# Patient Record
Sex: Male | Born: 1935 | Race: White | Hispanic: No | Smoking: Former smoker
Health system: Southern US, Community
[De-identification: ages and names within clinical notes are randomized; demographics above are authoritative.]

## PROBLEM LIST (undated history)

## (undated) DIAGNOSIS — R112 Nausea with vomiting, unspecified: Secondary | ICD-10-CM

## (undated) DIAGNOSIS — I472 Ventricular tachycardia, unspecified: Secondary | ICD-10-CM

## (undated) DIAGNOSIS — R9430 Abnormal result of cardiovascular function study, unspecified: Secondary | ICD-10-CM

## (undated) DIAGNOSIS — I493 Ventricular premature depolarization: Secondary | ICD-10-CM

## (undated) DIAGNOSIS — G5603 Carpal tunnel syndrome, bilateral upper limbs: Secondary | ICD-10-CM

## (undated) DIAGNOSIS — Z87442 Personal history of urinary calculi: Secondary | ICD-10-CM

## (undated) DIAGNOSIS — I350 Nonrheumatic aortic (valve) stenosis: Secondary | ICD-10-CM

## (undated) DIAGNOSIS — N183 Chronic kidney disease, stage 3 unspecified: Secondary | ICD-10-CM

## (undated) DIAGNOSIS — I4729 Other ventricular tachycardia: Secondary | ICD-10-CM

## (undated) DIAGNOSIS — Z9989 Dependence on other enabling machines and devices: Secondary | ICD-10-CM

## (undated) DIAGNOSIS — Z9889 Other specified postprocedural states: Secondary | ICD-10-CM

## (undated) DIAGNOSIS — M1990 Unspecified osteoarthritis, unspecified site: Secondary | ICD-10-CM

## (undated) DIAGNOSIS — Z8719 Personal history of other diseases of the digestive system: Secondary | ICD-10-CM

## (undated) DIAGNOSIS — E785 Hyperlipidemia, unspecified: Secondary | ICD-10-CM

## (undated) DIAGNOSIS — I340 Nonrheumatic mitral (valve) insufficiency: Secondary | ICD-10-CM

## (undated) DIAGNOSIS — N182 Chronic kidney disease, stage 2 (mild): Secondary | ICD-10-CM

## (undated) DIAGNOSIS — I480 Paroxysmal atrial fibrillation: Secondary | ICD-10-CM

## (undated) DIAGNOSIS — I5022 Chronic systolic (congestive) heart failure: Secondary | ICD-10-CM

## (undated) DIAGNOSIS — R040 Epistaxis: Secondary | ICD-10-CM

## (undated) DIAGNOSIS — I10 Essential (primary) hypertension: Secondary | ICD-10-CM

## (undated) DIAGNOSIS — Z950 Presence of cardiac pacemaker: Secondary | ICD-10-CM

## (undated) DIAGNOSIS — I429 Cardiomyopathy, unspecified: Secondary | ICD-10-CM

## (undated) DIAGNOSIS — I2510 Atherosclerotic heart disease of native coronary artery without angina pectoris: Secondary | ICD-10-CM

## (undated) DIAGNOSIS — D649 Anemia, unspecified: Secondary | ICD-10-CM

## (undated) DIAGNOSIS — I5032 Chronic diastolic (congestive) heart failure: Secondary | ICD-10-CM

## (undated) DIAGNOSIS — G4733 Obstructive sleep apnea (adult) (pediatric): Secondary | ICD-10-CM

## (undated) HISTORY — DX: Paroxysmal atrial fibrillation: I48.0

## (undated) HISTORY — DX: Nonrheumatic aortic (valve) stenosis: I35.0

## (undated) HISTORY — DX: Atherosclerotic heart disease of native coronary artery without angina pectoris: I25.10

## (undated) HISTORY — DX: Chronic systolic (congestive) heart failure: I50.22

## (undated) HISTORY — PX: COLONOSCOPY: SHX174

## (undated) HISTORY — DX: Obstructive sleep apnea (adult) (pediatric): G47.33

## (undated) HISTORY — PX: KNEE CARTILAGE SURGERY: SHX688

## (undated) HISTORY — DX: Essential (primary) hypertension: I10

## (undated) HISTORY — DX: Hyperlipidemia, unspecified: E78.5

## (undated) HISTORY — DX: Cardiomyopathy, unspecified: I42.9

## (undated) HISTORY — PX: INSERT / REPLACE / REMOVE PACEMAKER: SUR710

## (undated) HISTORY — DX: Obstructive sleep apnea (adult) (pediatric): Z99.89

## (undated) HISTORY — DX: Anemia, unspecified: D64.9

---

## 1969-07-01 HISTORY — PX: SHOULDER SURGERY: SHX246

## 1970-10-31 HISTORY — PX: KNEE CARTILAGE SURGERY: SHX688

## 1985-10-31 HISTORY — PX: SHOULDER HEMI-ARTHROPLASTY: SHX5049

## 1998-08-19 ENCOUNTER — Emergency Department (HOSPITAL_COMMUNITY): Admission: EM | Admit: 1998-08-19 | Discharge: 1998-08-19 | Payer: Self-pay | Admitting: Emergency Medicine

## 1998-08-19 ENCOUNTER — Encounter: Payer: Self-pay | Admitting: Emergency Medicine

## 1999-07-02 HISTORY — PX: CATARACT EXTRACTION W/ INTRAOCULAR LENS  IMPLANT, BILATERAL: SHX1307

## 2000-06-09 ENCOUNTER — Encounter: Payer: Self-pay | Admitting: Orthopedic Surgery

## 2000-06-09 ENCOUNTER — Ambulatory Visit (HOSPITAL_COMMUNITY): Admission: RE | Admit: 2000-06-09 | Discharge: 2000-06-09 | Payer: Self-pay | Admitting: Orthopedic Surgery

## 2001-08-24 ENCOUNTER — Encounter: Payer: Self-pay | Admitting: Otolaryngology

## 2001-08-24 ENCOUNTER — Encounter: Admission: RE | Admit: 2001-08-24 | Discharge: 2001-08-24 | Payer: Self-pay | Admitting: Otolaryngology

## 2001-08-28 ENCOUNTER — Ambulatory Visit (HOSPITAL_BASED_OUTPATIENT_CLINIC_OR_DEPARTMENT_OTHER): Admission: RE | Admit: 2001-08-28 | Discharge: 2001-08-28 | Payer: Self-pay | Admitting: Otolaryngology

## 2002-09-20 ENCOUNTER — Encounter: Payer: Self-pay | Admitting: *Deleted

## 2002-09-20 ENCOUNTER — Ambulatory Visit (HOSPITAL_COMMUNITY): Admission: RE | Admit: 2002-09-20 | Discharge: 2002-09-20 | Payer: Self-pay | Admitting: *Deleted

## 2002-10-10 ENCOUNTER — Encounter (INDEPENDENT_AMBULATORY_CARE_PROVIDER_SITE_OTHER): Payer: Self-pay | Admitting: Specialist

## 2002-10-10 ENCOUNTER — Ambulatory Visit (HOSPITAL_COMMUNITY): Admission: RE | Admit: 2002-10-10 | Discharge: 2002-10-10 | Payer: Self-pay | Admitting: *Deleted

## 2003-11-14 ENCOUNTER — Encounter: Admission: RE | Admit: 2003-11-14 | Discharge: 2003-11-14 | Payer: Self-pay | Admitting: Orthopedic Surgery

## 2004-11-04 ENCOUNTER — Ambulatory Visit (HOSPITAL_COMMUNITY): Admission: RE | Admit: 2004-11-04 | Discharge: 2004-11-04 | Payer: Self-pay | Admitting: *Deleted

## 2005-01-24 ENCOUNTER — Inpatient Hospital Stay (HOSPITAL_COMMUNITY): Admission: RE | Admit: 2005-01-24 | Discharge: 2005-01-28 | Payer: Self-pay | Admitting: Orthopedic Surgery

## 2005-05-11 ENCOUNTER — Ambulatory Visit (HOSPITAL_COMMUNITY): Admission: RE | Admit: 2005-05-11 | Discharge: 2005-05-11 | Payer: Self-pay | Admitting: Radiation Oncology

## 2005-10-31 HISTORY — PX: TOTAL KNEE ARTHROPLASTY: SHX125

## 2008-01-25 ENCOUNTER — Ambulatory Visit (HOSPITAL_COMMUNITY): Admission: RE | Admit: 2008-01-25 | Discharge: 2008-01-25 | Payer: Self-pay | Admitting: Cardiology

## 2008-12-17 ENCOUNTER — Encounter: Admission: RE | Admit: 2008-12-17 | Discharge: 2008-12-17 | Payer: Self-pay | Admitting: Family Medicine

## 2009-05-08 ENCOUNTER — Ambulatory Visit: Payer: Self-pay | Admitting: Surgery

## 2009-05-08 ENCOUNTER — Inpatient Hospital Stay (HOSPITAL_COMMUNITY): Admission: EM | Admit: 2009-05-08 | Discharge: 2009-05-09 | Payer: Self-pay | Admitting: Emergency Medicine

## 2009-05-08 ENCOUNTER — Encounter (INDEPENDENT_AMBULATORY_CARE_PROVIDER_SITE_OTHER): Payer: Self-pay | Admitting: Internal Medicine

## 2010-07-02 ENCOUNTER — Encounter: Payer: Self-pay | Admitting: Internal Medicine

## 2010-07-13 ENCOUNTER — Encounter: Payer: Self-pay | Admitting: Internal Medicine

## 2010-07-23 ENCOUNTER — Encounter: Payer: Self-pay | Admitting: Internal Medicine

## 2010-07-30 ENCOUNTER — Encounter: Payer: Self-pay | Admitting: Internal Medicine

## 2010-08-02 ENCOUNTER — Encounter (INDEPENDENT_AMBULATORY_CARE_PROVIDER_SITE_OTHER): Payer: Self-pay | Admitting: *Deleted

## 2010-09-10 ENCOUNTER — Encounter: Payer: Self-pay | Admitting: Internal Medicine

## 2010-09-27 ENCOUNTER — Ambulatory Visit: Payer: Self-pay | Admitting: Internal Medicine

## 2010-09-27 DIAGNOSIS — R55 Syncope and collapse: Secondary | ICD-10-CM | POA: Insufficient documentation

## 2010-09-27 DIAGNOSIS — I10 Essential (primary) hypertension: Secondary | ICD-10-CM | POA: Insufficient documentation

## 2010-09-27 DIAGNOSIS — I441 Atrioventricular block, second degree: Secondary | ICD-10-CM | POA: Insufficient documentation

## 2010-09-28 ENCOUNTER — Ambulatory Visit: Payer: Self-pay | Admitting: Internal Medicine

## 2010-09-28 LAB — CONVERTED CEMR LAB
Basophils Absolute: 0 10*3/uL (ref 0.0–0.1)
Basophils Relative: 0.1 % (ref 0.0–3.0)
Calcium: 8.8 mg/dL (ref 8.4–10.5)
Eosinophils Relative: 1.7 % (ref 0.0–5.0)
GFR calc non Af Amer: 62.57 mL/min (ref >60)
HCT: 43.2 % (ref 39.0–52.0)
Hemoglobin: 14.6 g/dL (ref 13.0–17.0)
INR: 1 (ref 0.8–1.0)
Lymphocytes Relative: 19.4 % (ref 12.0–46.0)
Lymphs Abs: 1.2 10*3/uL (ref 0.7–4.0)
Monocytes Relative: 5.2 % (ref 3.0–12.0)
Neutro Abs: 4.5 10*3/uL (ref 1.4–7.7)
Potassium: 3.6 meq/L (ref 3.5–5.1)
RBC: 4.65 M/uL (ref 4.22–5.81)
Sodium: 138 meq/L (ref 135–145)
WBC: 6.1 10*3/uL (ref 4.5–10.5)

## 2010-09-29 ENCOUNTER — Encounter: Payer: Self-pay | Admitting: Internal Medicine

## 2010-10-05 ENCOUNTER — Inpatient Hospital Stay (HOSPITAL_COMMUNITY)
Admission: RE | Admit: 2010-10-05 | Discharge: 2010-10-06 | Payer: Self-pay | Source: Home / Self Care | Attending: Internal Medicine | Admitting: Internal Medicine

## 2010-10-05 HISTORY — PX: PACEMAKER INSERTION: SHX728

## 2010-10-07 ENCOUNTER — Encounter: Payer: Self-pay | Admitting: Internal Medicine

## 2010-10-14 ENCOUNTER — Ambulatory Visit: Payer: Self-pay

## 2010-10-21 ENCOUNTER — Encounter: Payer: Self-pay | Admitting: Internal Medicine

## 2010-11-21 ENCOUNTER — Encounter: Payer: Self-pay | Admitting: Family Medicine
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
## 2010-11-30 NOTE — Miscellaneous (Signed)
Summary: Device preload  Clinical Lists Changes  Observations: Added new observation of PPM INDICATN: Syncope Mobitz II (10/07/2010 19:39) Added new observation of MAGNET RTE: BOL 85 ERI 65 (10/07/2010 19:39) Added new observation of PPMLEADSTAT2: active (10/07/2010 19:39) Added new observation of PPMLEADSER2: [id number] (10/07/2010 19:39) Added new observation of PPMLEADMOD2: 5092  (10/07/2010 19:39) Added new observation of PPMLEADLOC2: RV  (10/07/2010 19:39) Added new observation of PPMLEADSTAT1: active  (10/07/2010 19:39) Added new observation of PPMLEADSER1: [id number]  (10/07/2010 19:39) Added new observation of PPMLEADMOD1: 5076  (10/07/2010 19:39) Added new observation of PPMLEADLOC1: RA  (10/07/2010 19:39) Added new observation of PPM IMP MD: Hillis Range, MD  (10/07/2010 19:39) Added new observation of PPMLEADDOI2: 10/05/2010  (10/07/2010 19:39) Added new observation of PPMLEADDOI1: 10/05/2010  (10/07/2010 19:39) Added new observation of PPM DOI: 10/05/2010  (10/07/2010 19:39) Added new observation of PPM SERL#: [id number]  (10/07/2010 19:39) Added new observation of PPM MODL#: [id number]  (10/07/2010 32:44) Added new observation of PACEMAKERMFG: Medtronic  (10/07/2010 19:39) Added new observation of PACEMAKER MD: Hillis Range, MD  (10/07/2010 19:39)      PPM Specifications Following MD:  Hillis Range, MD     PPM Vendor:  Medtronic     PPM Model Number:  ADDRL1     PPM Serial Number:  [id number] PPM DOI:  10/05/2010     PPM Implanting MD:  Hillis Range, MD  Lead 1    Location: RA     DOI: 10/05/2010     Model #: [id number]     Serial #: [id number]     Status: active Lead 2    Location: RV     DOI: 10/05/2010     Model #: [id number]     Serial #: [id number]     Status: active  Magnet Response Rate:  BOL 85 ERI 65  Indications:  Syncope Mobitz II

## 2010-11-30 NOTE — Letter (Signed)
Summary: Implantable Device Instructions  Architectural technologist, Main Office  [address]   [place], [place] [address]   Phone: [phone]  Fax: [phone]      Implantable Device Instructions  You are scheduled for:  _____ Permanent Transvenous Pacemaker   on 10/05/10 with Dr. [name].  1.  Please arrive at the Short Stay Center at [facility] at 8:30am on the day of your procedure.  2.  Do not eat or drink  after midnight the night before your procedure.  3.  Complete lab work on 09/28/10.  .  You do not have to be fasting.  4.  All your medications can be taken with a sip of water the morning of your procedure   5.  Plan for an overnight stay.  Bring your insurance cards and a list of your medications.  6.  Wash your chest and neck with antibacterial soap (any brand) the evening before and the morning of your procedure.  Rinse well.  7.  Education material received:     Pacemaker _____            *If you have ANY questions after you get home, please call the office [phone].  Anselm Pancoast  *Every attempt is made to prevent procedures from being rescheduled.  Due to the nauture of Electrophysiology, rescheduling can happen.  The physician is always aware and directs the staff when this occurs.

## 2010-12-02 NOTE — Cardiovascular Report (Signed)
Summary: Office Visit   Office Visit   Imported By: Roderic Ovens 11/09/2010 14:10:55  _____________________________________________________________________  External Attachment:    Type:   Image     Comment:   External Document

## 2010-12-02 NOTE — Procedures (Signed)
Summary: End Of Summary Report  End Of Summary Report   Imported By: Erle Crocker 10/15/2010 16:18:44  _____________________________________________________________________  External Attachment:    Type:   Image     Comment:   External Document

## 2010-12-02 NOTE — Letter (Signed)
Summary: [facility] Office Visit Note   [facility] Office Visit Note   Imported By: Roderic Ovens 10/13/2010 10:59:19  _____________________________________________________________________  External Attachment:    Type:   Image     Comment:   External Document

## 2010-12-02 NOTE — Procedures (Signed)
Summary: wound check.mdt.amber   Current Medications (verified): 1)  Hydrochlorothiazide 25 Mg Tabs (Hydrochlorothiazide) .... 1/2 By Mouth Daily 2)  Vitamin D 2000 Unit Tabs (Cholecalciferol) .... Once Daily 3)  Aspirin 81 Mg Tbec (Aspirin) .... Take One Tablet By Mouth Daily 4)  Androgel .... Uad 5)  Fish Oil   Oil (Fish Oil) .... Once Daily  Allergies (verified): 1)  ! * Dilaudid 2)  ! Pcn  PPM Specifications Following MD:  Hillis Range, MD     PPM Vendor:  Medtronic     PPM Model Number:  ADDRL1     PPM Serial Number:  [id number] PPM DOI:  10/05/2010     PPM Implanting MD:  Hillis Range, MD  Lead 1    Location: RA     DOI: 10/05/2010     Model #: [id number]     Serial #: [id number]     Status: active Lead 2    Location: RV     DOI: 10/05/2010     Model #: [id number]     Serial #: [id number]     Status: active  Magnet Response Rate:  BOL 85 ERI 65  Indications:  Syncope Mobitz II   PPM Follow Up Remote Check?  No Battery Voltage:  2.79 V     Battery Est. Longevity:  13 years     Pacer Dependent:  No       PPM Device Measurements Atrium  Amplitude: 2.0 mV, Impedance: 607 ohms, Threshold: 0.75 V at 0.4 msec Right Ventricle  Amplitude: 15.68 mV, Impedance: 779 ohms, Threshold: 0.75 V at 0.4 msec  Episodes MS Episodes:  0     Percent Mode Switch:  0     Coumadin:  No Ventricular High Rate:  0     Atrial Pacing:  9.1%     Ventricular Pacing:  2.8%  Parameters Next Cardiology Appt Due:  01/27/2011 Tech Comments:  Steri strips removed, no redness or edema.  No parameter changes.  Device function normal.  ROV 01/27/11 with Dr. [name]. Altha Harm, LPN  October 21, 2010 9:47 AM

## 2010-12-03 NOTE — Cardiovascular Report (Signed)
Summary: Pre-Cath Orders  Pre-Cath Orders   Imported By: Marylou Mccoy 10/08/2010 18:10:04  _____________________________________________________________________  External Attachment:    Type:   Image     Comment:   External Document

## 2011-01-10 ENCOUNTER — Encounter: Payer: Self-pay | Admitting: Internal Medicine

## 2011-01-10 LAB — SURGICAL PCR SCREEN
MRSA, PCR: NEGATIVE
Staphylococcus aureus: NEGATIVE

## 2011-01-24 ENCOUNTER — Ambulatory Visit (INDEPENDENT_AMBULATORY_CARE_PROVIDER_SITE_OTHER): Payer: Medicare Other | Admitting: Internal Medicine

## 2011-01-24 ENCOUNTER — Encounter: Payer: Self-pay | Admitting: Internal Medicine

## 2011-01-24 DIAGNOSIS — I441 Atrioventricular block, second degree: Secondary | ICD-10-CM | POA: Insufficient documentation

## 2011-01-24 DIAGNOSIS — R55 Syncope and collapse: Secondary | ICD-10-CM

## 2011-01-24 NOTE — Patient Instructions (Signed)
Your physician recommends that you schedule a follow-up appointment in: as needed  

## 2011-01-24 NOTE — Assessment & Plan Note (Signed)
Doing well s/p PPM implantation by me in December.  The pocket has healed nicely and the device is functioning normally (see paceart note). No changes today Pt to have his device followed by Dr Eldridge Dace going forward.  I will see him as needed.

## 2011-01-24 NOTE — Progress Notes (Signed)
The patient presents today for routine electrophysiology followup.   He recently had a PPM implanted by me 10/06/10 for mobitz II AV block and syncope.  He has done very well since that time.  Today, he denies symptoms of palpitations, chest pain, shortness of breath, orthopnea, PND, lower extremity edema, dizziness, presyncope, syncope, or neurologic sequela.  The patient feels that he is tolerating medications without difficulties and is otherwise without complaint today.   Past Medical History  Diagnosis Date  . OSA on CPAP     noncompliant  . Obesity   . HTN (hypertension)   . Vitamin D deficiency   . History of multiple pulmonary nodules   . Low testosterone   . ED (erectile dysfunction)   . Osteopenia    Past Surgical History  Procedure Date  . Total knee arthroplasty 2007    Right  . Knee surgery 1972    Left  . Shoulder surgery     Right    Current outpatient prescriptions:aspirin 81 MG tablet, Take 81 mg by mouth daily.  , Disp: , Rfl: ;  Cholecalciferol (VITAMIN D) 2000 UNITS CAPS, Take by mouth daily.  , Disp: , Rfl: ;  fish oil-omega-3 fatty acids 1000 MG capsule, Take 2 g by mouth daily.  , Disp: , Rfl: ;  Testosterone (ANDROGEL TD), as directed.  , Disp: , Rfl: ;  DISCONTD: hydrochlorothiazide 25 MG tablet, Take 12.5 mg by mouth daily.  , Disp: , Rfl:   Allergies  Allergen Reactions  . Hydromorphone Hcl   . Penicillins     History   Social History  . Marital Status: Married    Spouse Name: N/A    Number of Children: N/A  . Years of Education: N/A   Occupational History  . retired [employer]   Social History Main Topics  . Smoking status: Former Smoker    Quit date: 10/31/1973  . Smokeless tobacco: Never Used  . Alcohol Use: No  . Drug Use: No  . Sexually Active: Not on file   Other Topics Concern  . Not on file   Social History Narrative  . No narrative on file    Family History  Problem Relation Age of Onset  . Cancer    . Heart disease       ROS-  All systems are reviewed and are negative except as outlined in the HPI above  Physical Exam: Filed Vitals:   01/24/11 0909  BP: 122/74  Pulse: 76  Height: 5' 10.5" (1.791 m)  Weight: 211 lb 12.8 oz (96.072 kg)    GEN- The patient is well appearing, alert and oriented x 3 today.   Head- normocephalic, atraumatic Eyes-  Sclera clear, conjunctiva pink Ears- hearing intact Oropharynx- clear Neck- supple, no JVP Lymph- no cervical lymphadenopathy Lungs- Clear to ausculation bilaterally, normal work of breathing Chest- pacemaker pocket is well healed Heart- Regular rate and rhythm, no murmurs, rubs or gallops, PMI not laterally displaced GI- soft, NT, ND, + BS Extremities- no clubbing, cyanosis, or edema MS- no significant deformity or atrophy Skin- no rash or lesion Psych- euthymic mood, full affect Neuro- strength and sensation are intact

## 2011-02-06 LAB — COMPREHENSIVE METABOLIC PANEL
ALT: 15 U/L (ref 0–53)
Alkaline Phosphatase: 45 U/L (ref 39–117)
Alkaline Phosphatase: 52 U/L (ref 39–117)
BUN: 16 mg/dL (ref 6–23)
CO2: 28 mEq/L (ref 19–32)
Chloride: 100 mEq/L (ref 96–112)
Glucose, Bld: 100 mg/dL — ABNORMAL HIGH (ref 70–99)
Glucose, Bld: 112 mg/dL — ABNORMAL HIGH (ref 70–99)
Potassium: 3.7 mEq/L (ref 3.5–5.1)
Potassium: 4.3 mEq/L (ref 3.5–5.1)
Sodium: 138 mEq/L (ref 135–145)
Total Bilirubin: 0.9 mg/dL (ref 0.3–1.2)
Total Protein: 6.4 g/dL (ref 6.0–8.3)

## 2011-02-06 LAB — PROTIME-INR
INR: 1 (ref 0.00–1.49)
Prothrombin Time: 13.5 seconds (ref 11.6–15.2)
Prothrombin Time: 13.9 seconds (ref 11.6–15.2)

## 2011-02-06 LAB — CBC
HCT: 43.6 % (ref 39.0–52.0)
Hemoglobin: 14.2 g/dL (ref 13.0–17.0)
Hemoglobin: 14.9 g/dL (ref 13.0–17.0)
MCV: 95.1 fL (ref 78.0–100.0)
RBC: 4.39 MIL/uL (ref 4.22–5.81)
RDW: 13.8 % (ref 11.5–15.5)
RDW: 14 % (ref 11.5–15.5)
WBC: 6.3 10*3/uL (ref 4.0–10.5)
WBC: 6.7 10*3/uL (ref 4.0–10.5)

## 2011-02-06 LAB — DIFFERENTIAL
Basophils Absolute: 0 10*3/uL (ref 0.0–0.1)
Basophils Relative: 0 % (ref 0–1)
Neutro Abs: 5.3 10*3/uL (ref 1.7–7.7)
Neutrophils Relative %: 79 % — ABNORMAL HIGH (ref 43–77)

## 2011-02-06 LAB — CARDIAC PANEL(CRET KIN+CKTOT+MB+TROPI)
CK, MB: 1 ng/mL (ref 0.3–4.0)
CK, MB: 1.1 ng/mL (ref 0.3–4.0)
Relative Index: INVALID (ref 0.0–2.5)
Troponin I: 0.02 ng/mL (ref 0.00–0.06)

## 2011-02-06 LAB — URINE CULTURE

## 2011-02-06 LAB — HEMOGLOBIN A1C
Hgb A1c MFr Bld: 5.7 % (ref 4.6–6.1)
Mean Plasma Glucose: 117 mg/dL

## 2011-02-06 LAB — URINALYSIS, ROUTINE W REFLEX MICROSCOPIC
Hgb urine dipstick: NEGATIVE
Nitrite: NEGATIVE
Protein, ur: NEGATIVE mg/dL
Urobilinogen, UA: 0.2 mg/dL (ref 0.0–1.0)

## 2011-02-06 LAB — CK TOTAL AND CKMB (NOT AT ARMC): CK, MB: 1 ng/mL (ref 0.3–4.0)

## 2011-02-06 LAB — APTT
aPTT: 28 seconds (ref 24–37)
aPTT: 32 seconds (ref 24–37)

## 2011-02-06 LAB — LIPID PANEL
Triglycerides: 73 mg/dL (ref <150)
VLDL: 15 mg/dL (ref 0–40)

## 2011-02-06 LAB — TROPONIN I: Troponin I: 0.01 ng/mL (ref 0.00–0.06)
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
## 2011-03-18 NOTE — Op Note (Signed)
NAME:  Gregory Mccormick, Gregory Mccormick NO.:  [id number]   MEDICAL RECORD NO.:  [id number]          PATIENT TYPE:  INP   LOCATION:  X007                         FACILITY:  [facility]   PHYSICIAN:  Ollen Gross, M.D.    DATE OF BIRTH:  17-Sep-1929   DATE OF PROCEDURE:  01/24/2005  DATE OF DISCHARGE:                                 OPERATIVE REPORT   PREOPERATIVE DIAGNOSIS:  Osteoarthritis, right knee.   POSTOPERATIVE DIAGNOSIS:  Osteoarthritis, right knee.   OPERATION PERFORMED:  Right total knee arthroplasty.   SURGEON:  Ollen Gross, M.D.   ASSISTANT:  Alexzandrew L. Julien Girt, P.A.   ANESTHESIA:  General with postoperative Marcaine pain pump.   ESTIMATED BLOOD LOSS:  Minimal.   DRAINS:  Hemovac times one.   COMPLICATIONS:  None.  Condition stable to recovery.   BRIEF CLINICAL NOTE:  Gregory Mccormick is a 77 year old male with end stage  arthritis of the right knee with intractable pain.  He has failed  nonoperative management and presents now for right total knee arthroplasty.   DESCRIPTION OF PROCEDURE:  After successful administration of general  anesthetic, the tourniquet was placed on the right thigh.  The right lower  extremity prepped and draped in the usual sterile fashion.  Extremity was  wrapped in Esmarch, knee flexed and tourniquet inflated to 300 mmHg.  Standard midline incision was made with a 10 blade through subcutaneous  tissue to the level of the extensor mechanism.  A fresh blade was used to  make a medial parapatellar arthrotomy in the soft tissue with proximal  medial tibia subperiosteally elevate the joint line with a knife and enter  the semimembranosus bursa with a Cobb elevator.  Soft tissue over the  proximal lateral tissue was also elevated with attention being paid to avoid  the patellar tendon on tibial tubercle.  The patella was everted and knee  flexed 90 degrees and ACL and PCL removed.  Drill was used to create a  starting hole in the distal  femur and the canal was irrigated. A 5 degree  right valgus alignment guide was placed and referencing off the posterior  condyles, rotation was marked and the block pinned too remove 10 mm of the  distal femur.  Distal femoral resection was made with an oscillating saw.  Sizing block was placed and size 5 was most appropriate.  Rotation was  marked off the epicondylar axis.  Size 5 cutting block was placed and  anterior, posterior and Chamfer cuts were made.   The tibia was then subluxed forward and the menisci were removed.  Extramedullary tibial alignment guides placed referencing proximally at the  medial aspect of the tibial tubercle and distally along the second  metatarsal axis and tibial crest.  Block was pinned to remove 10 mm off the  nondeficient lateral side.  Tibial resection was made with an oscillating  saw.  Size 4 was the most appropriate tibial component and then the proximal  tibia was prepared with a modular drill and keel punch for the size 4.  Femoral preparation was completed with the intercondylar  cut for the size 5.   Size 4 mobile bearing tibial trial, size 5 posterior stabilized femoral  trial and a 10 mm posterior stabilized rotating platform insert trial were  placed.  With the 10, full extension was achieved with excellent varus and  valgus balance throughout full range of motion.  The patella was then  everted and thickness measured to be 22 mm.  Free hand resection was taken  to 12 mm, 38 template was placed, lug holes were drilled, trial patella was  placed and it tracks normally.  The osteophytes were then removed off the  posterior femur with the trials in place.  All trials were removed and the  cut bone surfaces were prepared with pulsatile lavage.  Cement was mixed and  once ready for implantation, the size 4 mobile bearing tibial tray, size 5  posterior stabilized femur and 38 patella were cemented into place and the  patella was held with a clamp.   A trial 10 mm insert was placed and knee  held in full extension and all extruded cement was removed.  Once the cement  was fully hardened, and the permanent 10 mm posterior stabilized rotating  platform insert was placed to the tibial tray.  The wound was copiously  irrigated with saline solution and the extensor mechanism closed over a  Hemovac drain with interrupted #1 PDS.  Flexion against gravity was 135  degrees.  Tourniquet was released with a total time of 46 minutes.  Subcu  was closed with interrupted 2-0 Vicryl and subcuticular 4-0 Monocryl.  Incision was cleaned and dried and the catheter for the Marcaine pain pump  was placed and the pump initiated.  Steri-Strips and a bulky sterile  dressing were applied.  Drains hooked to suction.  He was placed into a knee  immobilizer, awakened and transported to recovery in stable condition.      FA/MEDQ  D:  01/24/2005  T:  01/24/2005  Job:  [id number]

## 2011-03-18 NOTE — Discharge Summary (Signed)
NAME:  Gregory Mccormick, Gregory Mccormick                ACCOUNT NO.:  [id number]   MEDICAL RECORD NO.:  [id number]          PATIENT TYPE:  INP   LOCATION:  0473                         [facility]ACILITY:  [facility]   PHYSICIAN:  Ollen Gross, M.D.    DATE OF BIRTH:  1929-07-08   DATE OF ADMISSION:  01/24/2005  DATE OF DISCHARGE:  01/28/2005                                 DISCHARGE SUMMARY   ADMISSION DIAGNOSES:  1.  Osteoarthritis right knee.  2.  Hypertension.  3.  Hiatal hernia.  4.  Hemorrhoids.   DISCHARGE DIAGNOSES:  1.  Osteoarthritis right knee status post right total knee arthroplasty.  2.  Postoperative hyponatremia, improved.  3.  Postoperative nausea, improved.  4.  Hypertension.  5.  Hiatal hernia.  6.  Hemorrhoids.   PROCEDURE:  On January 24, 2005 right total knee arthroplasty.   SURGEON:  Ollen Gross, M.D.   ASSISTANT:  Alexzandrew L. Perkins, P.A.-C.   ANESTHESIA:  General.  Postoperative Marcaine pain pump.   BLOOD LOSS:  Minimal.   DRAINS:  Hemovac drain x1.   CONSULTATIONS:  None.   HISTORY OF PRESENT ILLNESS:  Gregory Mccormick is a 77 year old male with end-  stage arthritis of the right knee with intractable pain.  He has failed  nonoperative management and now presents for a total knee arthroplasty.   LABORATORY DATA:  Preoperative CBC hemoglobin 14.5, hematocrit 42.6,  differential within normal limits.  Postoperative hemoglobin 12, last noted  H&H 11.0 and 31.6.  PT/PTT 11.7 and 25, respectively with an INR of 0.8.  Serial protimes followed.  Last noted PT INR 17.8 and 1.8.  Chem panel on  admission all within normal limits.  Sodium did drop 138 down to 129, back  up to 134.  Glucose went up from  94 to 142, back down to 124.  The  remainder of the electrolytes are stable within normal limits.  Urinalysis  preoperative negative.  Blood group/type O positive.   EKG on January 20, 2005 normal sinus rhythm, normal EKG, no old tracing to  compare.  Confirmed by Dr. Susa Griffins.  A two-view chest on January 20, 2005 cardiomegaly, no active disease.  Prominent right-sided thoracic  osteophytes as before.  Hiatal hernia.   HOSPITAL COURSE:  Admitted to [facility] and underwent the above  procedure.  Tolerated well.  Later transferred to the recovery room and then  orthopedic floor.  Continued postoperative care.  Vital signs were followed.  Had complaints of dry mouth on the morning after surgery.  Had a little bit  of nausea and felt that it could be from the PCA.  The PCA medications were  changed.  Started on antiemetics.  Hemovac drain pulled.  Did have a little  bit of drop in sodium down to 129.  Fluids were changed and KVO.  By day two  the nausea had resolved.  Felt to be due to the first PCA.  Switched over to  second with better control.  By day two weaned her to p.o. medications and  the PCA was discontinued.  Hyponatremia  had already started to improve.  Started up with more therapy.  Wanted to go home and therefore, discharge  planning arranged for home health.  Did well from a therapy standpoint  ambulating approximately 100-200 feet on day two and then got up to 400 feet  on day three.  Was doing so well with therapy by day three was set up to go  home the following day on January 28, 2005. Tolerating medications and was  discharged home.  The patient discharged home on January 28, 2005.   DISCHARGE DIAGNOSES:  Please see above.   DISCHARGE MEDICATIONS:  Coumadin, Percocet and Robaxin.   DIET:  As tolerated.   ACTIVITY:  Weightbearing as tolerated.  Home health, PT and OT and home  health nursing.  Total knee protocol.  Followup in two weeks from surgery.   DISPOSITION:  Home.  Condition on discharge improved.      ALP/MEDQ  D:  03/09/2005  T:  03/09/2005  Job:  [id number]   cc:   Tally Joe, M.D.  [address]  [place], [place] [address]  Fax: [phone]

## 2011-03-18 NOTE — Op Note (Signed)
[place]  Patient:    Gregory Mccormick, Gregory Mccormick Visit Number: [id number] MRN: [id number]          Service Type: DSU Location: [facility] Attending Physician:  Carlean Purl Proc. 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  PROCEDURE PERFORMED:  Exploratory tympanotomy with reconstruction.  SURGEON:  Kristine Garbe. Ezzard Standing, M.D.  ANESTHESIA:  General endotracheal.  COMPLICATIONS:  None.  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 He has a significant conductive hearing loss of approximately 30 db and is taken to the operating room at this time for tympanotomy or possible reconstruction.  DESCRIPTION OF PROCEDURE:  After adequate endotracheal anesthesia, the patients right ear was prepped with Betadine solution, and draped off with sterile towels.  The patient received 1 g of Ancef intraoperatively.  A posterior based tympanomeatal flap was elevated down to the annulus, which was elevated and the middle ear space was entered.  The patient had a previous myringotomy tube placed with no active drainage, although on entering the middle ear space, there was some thick mucoid fluid around the stapes region as well as a little bit anteriorly in the eustachian tube opening the region. But this was just minimal.  There was a fair amount of scar tissue around the stapes super structure.  This was released.  The stapes super structure appeared still intact and was mobile.  There was adhesions and scar tissue between the stapes super structure and what appeared to be the malleolus.  The incus was really not recognized.  The  head of the malleolus was deviated more medially and was lying within 1-2 mm of the stapes super structure.  He had a fibrous union between the malleolus and the stapes.  There is really not room to place a prosthesis, but the adhesions around the stapes super structure were released and cartilage from the tragus was harvested and cut to appropriate size and placed as a wedge between the stapes super structure and the malleolus.  This basically completed the procedure.  Of note, there was no evidence of cholesteatoma and the remaining middle ear mucosa appeared clear. The middle ear was packed with Gelfoam soaked with colimycin.  The tympanomeatal flap was brought back down posteriorly and the ear canal was packed with Gelfoam soaked in colimycin.  The tympanomeatal flap was brought back down posteriorly and the ear canal was packed with Gelfoam and soaked in colimycin.  A paparella type 1 tube was reinserted through the previous myringotomy site.  The remaining ear canal was packed with Gelfoam.  The site where we harvested the tragal cartilage was closed with interrupted 4-0 plain gut suture.  A cotton ball was placed in the ear followed by a Band-Aid dressing.  This completed the procedure.  The patient was awakened from anesthesia and transferred to the recovery room postop doing well.  DISPOSITION:  Gregory Mccormick is discharged home later this morning on Keflex 500 mg b.i.d. for one week, Tylenol and Tylenol No. 3 p.r.n. pain.  Will have him follow up in my office in two weeks for recheck. Attending  Physician:  Carlean Purl DD:  08/28/01 TD:  08/29/01 Job: [id number]

## 2011-03-18 NOTE — H&P (Signed)
NAME:  Gregory Mccormick, Gregory Mccormick NO.:  [id number]   MEDICAL RECORD NO.:  [id number]          PATIENT TYPE:  INP   LOCATION:  NA                           [facility]ACILITY[facility]  [facility]   PHYSICIAN:  Ollen Gross, M.D.    DATE OF BIRTH:  11-May-1929   DATE OF ADMISSION:  01/24/2005  DATE OF DISCHARGE:                                HISTORY & PHYSICAL   DATE OF OFFICE VISIT AND HISTORY AND PHYSICAL:  January 18, 2005   CHIEF COMPLAINT:  Right knee pain.   HISTORY OF PRESENT ILLNESS:  The patient is a 77 year old male, previous  patient of Dr. Leim Fabry, who was seen by Dr. Lequita Halt for a painful right  knee.  He has had knee pain for many years now.  Back in '74, he had an open  medial meniscectomy.  For the past 3 years, the pain in his knee is  progressively getting worse.  It is painful through the day with activity.  He has had even some pain at night.  He has undergone medications and  injections, but nothing has really helped.  He was seen in the office where  x-rays show significant end-stage arthritic changes in the right knee with  bone-on-bone medial and patellofemoral lateral marginal osteophytes.  He has  felt due to the significant findings and pain, that he would benefit from  undergoing surgical intervention.  Risks and benefits discussed.  The  patient is subsequently admitted to the hospital.   ALLERGIES:  No known drug allergies.   CURRENT MEDICATIONS:  1.  Celebrex daily.  2.  Lisinopril 10 mg daily.  3.  He is on 2 herbal supplements.   PAST MEDICAL HISTORY:  1.  Hypertension.  2.  Hiatal hernia.  3.  Hemorrhoids.   PAST SURGICAL HISTORY:  1.  He has undergone a colonoscopy which was normal except for his      hemorrhoids.  2.  Left knee surgery.  3.  Right knee surgery.  4.  Right shoulder surgery x 2.   SOCIAL HISTORY:  Married, retired from [employer].  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   FAMILY HISTORY:  Mother deceased age 21 with a history of cancer and heart  disease.  Father deceased age 54 with a history of heart disease and  arthritis.   REVIEW OF SYSTEMS:  GENERAL:  No fevers, chills, or night sweats.  NEUROLOGIC:  No seizures, syncope, paralysis.  RESPIRATORY:  No shortness of  breath, productive cough, or hemoptysis.  CARDIOVASCULAR:  No chest pain,  angina, or orthopnea.  GI:  No nausea, vomiting, diarrhea, or constipation.  GU:  No dysuria, hematuria, or discharge.  MUSCULOSKELETAL:  Right knee,  found in the history of present illness.   PHYSICAL EXAMINATION:  VITAL SIGNS:  Pulse 80, respirations 16, blood  pressure 112/70.  GENERAL:  A 77 year old white male, well-nourished, well-developed, in no  acute distress.  He is alert, oriented, cooperative, very pleasant.  HEENT:  Normocephalic, atraumatic.  Pupils are  round and reactive.  Oropharynx clear.  EOMs are intact.  Noted to have upper dentures.  NECK:  Supple.  No bruits are appreciated.  CHEST:  Clear anterior and posterior chest walls.  No rhonchi, rales, or  wheezing.  HEART:  Regular rate and rhythm, no murmurs.  ABDOMEN:  Soft, nontender.  Bowel sounds are present.  RECTAL/BREASTS/GENITALIA:  Not done.  Not pertinent to present illness.  EXTREMITIES:  Right knee.  Right knee does not show any effusion.  He has a  slight varus malalignment deformity.  Marked crepitus is noted.  Range of  motion of 5-115 degrees.  There is no instability.   IMPRESSION:  1.  Osteoarthritis, right knee.  2.  Hypertension.  3.  Hiatal hernia.  4.  Hemorrhoids.   PLAN:  The patient will be admitted to [facility] to undergo a  right total knee arthroplasty.  The surgery will be performed by Dr. Ollen Gross.      ALP/MEDQ  D:  01/23/2005  T:  01/23/2005  Job:  [id number]   cc:   Tally Joe, M.D.  [address]  [place], [place] [address]  Fax: [phone]    Ollen Gross, M.D.  [facility]  [address]  [address]  [place]  [place] [address]  Fax: [phone]

## 2012-10-22 ENCOUNTER — Other Ambulatory Visit: Payer: Self-pay | Admitting: Orthopedic Surgery

## 2012-10-22 MED ORDER — DEXAMETHASONE SODIUM PHOSPHATE 10 MG/ML IJ SOLN: 10.0000 mg | Freq: Once | INTRAMUSCULAR | Status: DC

## 2012-10-22 MED ORDER — BUPIVACAINE LIPOSOME 1.3 % IJ SUSP: 20.0000 mL | Freq: Once | INTRAMUSCULAR | Status: DC

## 2012-10-22 NOTE — Progress Notes (Signed)
Preoperative surgical orders have been place into the [facility] hospital system for Gregory Mccormick on 10/22/2012, 5:12 PM  by Patrica Duel for surgery on 11/26/2012.  Preop Total Knee orders including Experal, IV Tylenol, and IV Decadron as long as there are no contraindications to the above medications. Avel Peace, PA-C

## 2012-11-19 ENCOUNTER — Other Ambulatory Visit (HOSPITAL_COMMUNITY): Payer: Self-pay | Admitting: *Deleted

## 2012-11-19 ENCOUNTER — Encounter (HOSPITAL_COMMUNITY): Payer: Self-pay | Admitting: Pharmacy Technician

## 2012-11-20 ENCOUNTER — Encounter (HOSPITAL_COMMUNITY)
Admission: RE | Admit: 2012-11-20 | Discharge: 2012-11-20 | Disposition: A | Discharge status: Home / Self Care | Payer: Medicare Other | Source: Ambulatory Visit | Attending: Orthopedic Surgery | Admitting: Orthopedic Surgery

## 2012-11-20 ENCOUNTER — Ambulatory Visit (HOSPITAL_COMMUNITY)
Admission: RE | Admit: 2012-11-20 | Discharge: 2012-11-20 | Disposition: A | Discharge status: Home / Self Care | Payer: Medicare Other | Source: Ambulatory Visit | Attending: Orthopedic Surgery | Admitting: Orthopedic Surgery

## 2012-11-20 ENCOUNTER — Encounter (HOSPITAL_COMMUNITY): Payer: Self-pay

## 2012-11-20 DIAGNOSIS — E871 Hypo-osmolality and hyponatremia: Secondary | ICD-10-CM | POA: Diagnosis not present

## 2012-11-20 DIAGNOSIS — Z01812 Encounter for preprocedural laboratory examination: Secondary | ICD-10-CM | POA: Insufficient documentation

## 2012-11-20 DIAGNOSIS — Z01818 Encounter for other preprocedural examination: Secondary | ICD-10-CM | POA: Insufficient documentation

## 2012-11-20 DIAGNOSIS — E669 Obesity, unspecified: Secondary | ICD-10-CM | POA: Diagnosis present

## 2012-11-20 DIAGNOSIS — M25569 Pain in unspecified knee: Secondary | ICD-10-CM | POA: Diagnosis present

## 2012-11-20 DIAGNOSIS — G4733 Obstructive sleep apnea (adult) (pediatric): Secondary | ICD-10-CM | POA: Diagnosis present

## 2012-11-20 DIAGNOSIS — M1710 Unilateral primary osteoarthritis, unspecified knee: Secondary | ICD-10-CM | POA: Diagnosis present

## 2012-11-20 DIAGNOSIS — I10 Essential (primary) hypertension: Secondary | ICD-10-CM | POA: Diagnosis present

## 2012-11-20 DIAGNOSIS — D62 Acute posthemorrhagic anemia: Secondary | ICD-10-CM | POA: Diagnosis not present

## 2012-11-20 DIAGNOSIS — K449 Diaphragmatic hernia without obstruction or gangrene: Secondary | ICD-10-CM | POA: Insufficient documentation

## 2012-11-20 DIAGNOSIS — Z96659 Presence of unspecified artificial knee joint: Secondary | ICD-10-CM | POA: Diagnosis not present

## 2012-11-20 DIAGNOSIS — Z950 Presence of cardiac pacemaker: Secondary | ICD-10-CM | POA: Insufficient documentation

## 2012-11-20 HISTORY — DX: Unspecified osteoarthritis, unspecified site: M19.90

## 2012-11-20 HISTORY — DX: Other specified postprocedural states: Z98.890

## 2012-11-20 HISTORY — DX: Nausea with vomiting, unspecified: R11.2

## 2012-11-20 LAB — APTT: aPTT: 33 seconds (ref 24–37)

## 2012-11-20 LAB — URINALYSIS, ROUTINE W REFLEX MICROSCOPIC
Bilirubin Urine: NEGATIVE
Hgb urine dipstick: NEGATIVE
Nitrite: NEGATIVE
Specific Gravity, Urine: 1.018 (ref 1.005–1.030)
Urobilinogen, UA: 0.2 mg/dL (ref 0.0–1.0)
pH: 6 (ref 5.0–8.0)

## 2012-11-20 LAB — CBC
MCHC: 33.3 g/dL (ref 30.0–36.0)
Platelets: 214 10*3/uL (ref 150–400)
RDW: 13 % (ref 11.5–15.5)
WBC: 8 10*3/uL (ref 4.0–10.5)

## 2012-11-20 LAB — COMPREHENSIVE METABOLIC PANEL
AST: 18 U/L (ref 0–37)
Albumin: 3.8 g/dL (ref 3.5–5.2)
Alkaline Phosphatase: 66 U/L (ref 39–117)
BUN: 16 mg/dL (ref 6–23)
Chloride: 100 mEq/L (ref 96–112)
Potassium: 4.3 mEq/L (ref 3.5–5.1)
Sodium: 139 mEq/L (ref 135–145)
Total Bilirubin: 0.5 mg/dL (ref 0.3–1.2)
Total Protein: 7 g/dL (ref 6.0–8.3)

## 2012-11-20 LAB — SURGICAL PCR SCREEN
MRSA, PCR: NEGATIVE
Staphylococcus aureus: NEGATIVE

## 2012-11-20 LAB — PROTIME-INR: Prothrombin Time: 13.8 seconds (ref 11.6–15.2)

## 2012-11-20 NOTE — Patient Instructions (Addendum)
20 Gregory Mccormick  11/20/2012   Your procedure is scheduled on: 11-26-12  Report to [facility] at 0725 AM.  Call this number if you have problems the morning of surgery [phone]   Remember:   Do not eat food or drink liquids :After Midnight.     Take these medicines the morning of surgery with A SIP OF WATER: no meds to take                                SEE [facility] PREPARING FOR SURGERY SHEET   Do not wear jewelry, make-up or nail polish.  Do not wear lotions, powders, or perfumes. You may wear deodorant.   Men may shave face and neck.  Do not bring valuables to the hospital.  Contacts, dentures or bridgework may not be worn into surgery.  Leave suitcase in the car. After surgery it may be brought to your room.  For patients admitted to the hospital, checkout time is 11:00 AM the day of discharge.   Patients discharged the day of surgery will not be allowed to drive home.  Name and phone number of your driver:  Special Instructions: N/A   Please read over the following fact sheets that you were given: MRSA Information., blood fact sheet, incentive spirometer fact sheet Call Cain Sieve RN pre op nurse if needed [phone]    FAILURE TO FOLLOW THESE INSTRUCTIONS MAY RESULT IN THE CANCELLATION OF YOUR SURGERY. PATIENT SIGNATURE___________________________________________

## 2012-11-20 NOTE — Progress Notes (Addendum)
ekg 01-02-2012 [facility] cardiology on chart lov note dr Jocelyn Lamer 05-14-2012 cardiology on chart Last pacer check 08-06-12 dr Jocelyn Lamer on chart  cardiac clearance note dr Gloriajean Dell on chart

## 2012-11-22 NOTE — H&P (Signed)
TOTAL KNEE ADMISSION H&P  Patient is being admitted for left total knee arthroplasty.  Subjective:  Chief Complaint:left knee pain.  HPI: Gregory Mccormick, 79 y.o. male, has a history of pain and functional disability in the left knee due to arthritis and has failed non-surgical conservative treatments for greater than 12 weeks to includecorticosteriod injections, viscosupplementation injections, flexibility and strengthening excercises and activity modification.  Onset of symptoms was gradual, starting at least 5 years ago with gradually worsening course since that time. The patient noted prior procedures on the knee to include  menisectomy on the left knee(s).  Patient currently rates pain in the left knee(s) at 6 out of 10 with activity. Patient has night pain, worsening of pain with activity and weight bearing, pain that interferes with activities of daily living, pain with passive range of motion and crepitus.  Patient has evidence of subchondral cysts, subchondral sclerosis, periarticular osteophytes and joint space narrowing by imaging studies.There is no active infection.  Patient Active Problem List   Diagnosis Date Noted  . Mobitz (type) II atrioventricular block 01/24/2011  . ESSENTIAL HYPERTENSION, BENIGN 09/27/2010  . ATRIOVENTRICULAR BLOCK, 2ND DEGREE 09/27/2010  . SYNCOPE 09/27/2010   Past Medical History  Diagnosis Date  . Obesity   . HTN (hypertension)   . Vitamin D deficiency   . History of multiple pulmonary nodules   . Low testosterone   . ED (erectile dysfunction)   . Osteopenia   . OSA on CPAP     does not use cpap, does not like  . Arthritis   . PONV (postoperative nausea and vomiting)     Past Surgical History  Procedure Date  . Total knee arthroplasty 2007    Right  . Knee surgery 1972    Left  . Shoulder surgery     Right    Current outpatient prescriptions: acetaminophen (TYLENOL) 500 MG tablet, Take 1,000 mg by mouth every 6 (six) hours as needed.  For leg pain., Disp: , Rfl: ;   aspirin EC 81 MG tablet, Take 81 mg by mouth every morning., Disp: , Rfl: ;   Cholecalciferol (VITAMIN D) 2000 UNITS CAPS, Take 2,000 Units by mouth every morning. , Disp: , Rfl: ;   fish oil-omega-3 fatty acids 1000 MG capsule, Take 1 g by mouth every morning. , Disp: , Rfl:  hydrochlorothiazide (HYDRODIURIL) 12.5 MG tablet, Take 12.5 mg by mouth daily. Every other day, Disp: , Rfl: ;   testosterone (ANDROGEL) 50 MG/5GM GEL, Place 7.5 g onto the skin every morning., Disp: , Rfl:   Allergies  Allergen Reactions  . Hydromorphone Hcl Nausea And Vomiting  . Penicillins Hives    History  Substance Use Topics  . Smoking status: Former Smoker -- 1.0 packs/day for 26 years    Types: Cigarettes    Quit date: 10/31/1973  . Smokeless tobacco: Never Used  . Alcohol Use: No    Family History  Problem Relation Age of Onset  . Cancer    . Heart disease       Review of Systems  Constitutional: Negative.   HENT: Negative.  Negative for neck pain.        Wears dentures (partial upper)  Eyes: Negative.   Respiratory: Negative.   Cardiovascular: Negative.   Gastrointestinal: Negative.   Genitourinary: Negative.   Musculoskeletal: Positive for joint pain. Negative for myalgias, back pain and falls.       Left knee pain  Skin: Negative.   Neurological: Negative.  Endo/Heme/Allergies: Negative.   Psychiatric/Behavioral: Negative.     Objective:  Physical Exam  Constitutional: He is oriented to person, place, and time. He appears well-developed and well-nourished. No distress.  HENT:  Head: Normocephalic and atraumatic.  Right Ear: External ear normal.  Left Ear: External ear normal.  Nose: Nose normal.  Eyes: Conjunctivae normal and EOM are normal.  Neck: Normal range of motion. Neck supple. No tracheal deviation present. No thyromegaly present.  Cardiovascular: Normal rate, regular rhythm, normal heart sounds and intact distal pulses.   Respiratory:  Effort normal and breath sounds normal. No respiratory distress. He has no wheezes. He exhibits no tenderness.  GI: Soft. Bowel sounds are normal. He exhibits no distension and no mass. There is no tenderness.  Musculoskeletal:       Right hip: Normal.       Left hip: Normal.       Right knee: He exhibits decreased range of motion. He exhibits no swelling and no erythema. no tenderness found.       Left knee: He exhibits decreased range of motion and swelling. He exhibits no erythema.       Right lower leg: He exhibits no tenderness and no swelling.       Left lower leg: He exhibits no tenderness and no swelling.       Legs: Lymphadenopathy:    He has no cervical adenopathy.  Neurological: He is alert and oriented to person, place, and time. He has normal strength and normal reflexes. No sensory deficit.  Skin: No rash noted. He is not diaphoretic. No erythema.  Psychiatric: He has a normal mood and affect. His behavior is normal.    Vitals Weight: 218 lb Height: 69.5 in Body Surface Area: 2.2 m Body Mass Index: 31.73 kg/m Pulse: 68 (Regular) Resp.: 16 (Unlabored) BP: 134/80 (Sitting, Left Arm, Standard)    Estimated Body mass index is 29.96 kg/(m^2) as calculated from the following:   Height as of 01/24/11: 5' 10.5"(1.791 m).   Weight as of 01/24/11: 211 lb 12.8 oz(96.072 kg).   Imaging Review Plain radiographs demonstrate severe degenerative joint disease of the left knee(s). The overall alignment ismild varus. The bone quality appears to be fair for age and reported activity level.  Assessment/Plan:  End stage arthritis, left knee   The patient history, physical examination, clinical judgment of the provider and imaging studies are consistent with end stage degenerative joint disease of the left knee(s) and total knee arthroplasty is deemed medically necessary. The treatment options including medical management, injection therapy arthroscopy and arthroplasty were  discussed at length. The risks and benefits of total knee arthroplasty were presented and reviewed. The risks due to aseptic loosening, infection, stiffness, patella tracking problems, thromboembolic complications and other imponderables were discussed. The patient acknowledged the explanation, agreed to proceed with the plan and consent was signed. Patient is being admitted for inpatient treatment for surgery, pain control, PT, OT, prophylactic antibiotics, VTE prophylaxis, progressive ambulation and ADL's and discharge planning. The patient is planning to be discharged home with home health services    [place], [place]

## 2012-11-22 NOTE — Progress Notes (Addendum)
perioperarive prescription for implanted device programming orders placed on chart from dr varanos received and  placed on chart

## 2012-11-26 ENCOUNTER — Inpatient Hospital Stay (HOSPITAL_COMMUNITY)
Admission: RE | Admit: 2012-11-26 | Discharge: 2012-11-28 | DRG: 470 | Disposition: A | Discharge status: Home Health | Payer: Medicare Other | Source: Ambulatory Visit | Attending: Orthopedic Surgery | Admitting: Orthopedic Surgery

## 2012-11-26 ENCOUNTER — Inpatient Hospital Stay (HOSPITAL_COMMUNITY): Payer: Medicare Other | Admitting: Anesthesiology

## 2012-11-26 ENCOUNTER — Encounter (HOSPITAL_COMMUNITY)
Admission: RE | Disposition: A | Discharge status: Home Health | Payer: Self-pay | Source: Ambulatory Visit | Attending: Orthopedic Surgery

## 2012-11-26 ENCOUNTER — Encounter (HOSPITAL_COMMUNITY): Payer: Self-pay | Admitting: *Deleted

## 2012-11-26 ENCOUNTER — Encounter (HOSPITAL_COMMUNITY): Payer: Self-pay | Admitting: Anesthesiology

## 2012-11-26 DIAGNOSIS — N182 Chronic kidney disease, stage 2 (mild): Secondary | ICD-10-CM | POA: Diagnosis present

## 2012-11-26 DIAGNOSIS — Z96659 Presence of unspecified artificial knee joint: Secondary | ICD-10-CM | POA: Diagnosis not present

## 2012-11-26 DIAGNOSIS — D649 Anemia, unspecified: Secondary | ICD-10-CM | POA: Diagnosis present

## 2012-11-26 DIAGNOSIS — R3915 Urgency of urination: Secondary | ICD-10-CM | POA: Diagnosis not present

## 2012-11-26 DIAGNOSIS — R5383 Other fatigue: Secondary | ICD-10-CM | POA: Diagnosis not present

## 2012-11-26 DIAGNOSIS — W010XXA Fall on same level from slipping, tripping and stumbling without subsequent striking against object, initial encounter: Secondary | ICD-10-CM | POA: Diagnosis present

## 2012-11-26 DIAGNOSIS — Z1159 Encounter for screening for other viral diseases: Secondary | ICD-10-CM | POA: Diagnosis not present

## 2012-11-26 DIAGNOSIS — Z96651 Presence of right artificial knee joint: Secondary | ICD-10-CM | POA: Diagnosis present

## 2012-11-26 DIAGNOSIS — I519 Heart disease, unspecified: Secondary | ICD-10-CM | POA: Diagnosis not present

## 2012-11-26 DIAGNOSIS — Z950 Presence of cardiac pacemaker: Secondary | ICD-10-CM

## 2012-11-26 DIAGNOSIS — I700 Atherosclerosis of aorta: Secondary | ICD-10-CM | POA: Diagnosis not present

## 2012-11-26 DIAGNOSIS — I4821 Permanent atrial fibrillation: Secondary | ICD-10-CM | POA: Diagnosis not present

## 2012-11-26 DIAGNOSIS — E871 Hypo-osmolality and hyponatremia: Secondary | ICD-10-CM | POA: Diagnosis not present

## 2012-11-26 DIAGNOSIS — J90 Pleural effusion, not elsewhere classified: Secondary | ICD-10-CM | POA: Diagnosis not present

## 2012-11-26 DIAGNOSIS — Z01818 Encounter for other preprocedural examination: Secondary | ICD-10-CM | POA: Diagnosis present

## 2012-11-26 DIAGNOSIS — S72001P Fracture of unspecified part of neck of right femur, subsequent encounter for closed fracture with malunion: Secondary | ICD-10-CM | POA: Diagnosis not present

## 2012-11-26 DIAGNOSIS — J439 Emphysema, unspecified: Secondary | ICD-10-CM | POA: Diagnosis not present

## 2012-11-26 DIAGNOSIS — J3489 Other specified disorders of nose and nasal sinuses: Secondary | ICD-10-CM | POA: Diagnosis not present

## 2012-11-26 DIAGNOSIS — D62 Acute posthemorrhagic anemia: Secondary | ICD-10-CM | POA: Diagnosis not present

## 2012-11-26 DIAGNOSIS — Z885 Allergy status to narcotic agent status: Secondary | ICD-10-CM | POA: Diagnosis not present

## 2012-11-26 DIAGNOSIS — I480 Paroxysmal atrial fibrillation: Secondary | ICD-10-CM | POA: Diagnosis present

## 2012-11-26 DIAGNOSIS — I420 Dilated cardiomyopathy: Secondary | ICD-10-CM | POA: Diagnosis not present

## 2012-11-26 DIAGNOSIS — Z880 Allergy status to penicillin: Secondary | ICD-10-CM | POA: Diagnosis not present

## 2012-11-26 DIAGNOSIS — I5043 Acute on chronic combined systolic (congestive) and diastolic (congestive) heart failure: Secondary | ICD-10-CM | POA: Diagnosis not present

## 2012-11-26 DIAGNOSIS — I5022 Chronic systolic (congestive) heart failure: Secondary | ICD-10-CM | POA: Diagnosis not present

## 2012-11-26 DIAGNOSIS — Z955 Presence of coronary angioplasty implant and graft: Secondary | ICD-10-CM | POA: Diagnosis not present

## 2012-11-26 DIAGNOSIS — M1710 Unilateral primary osteoarthritis, unspecified knee: Principal | ICD-10-CM | POA: Diagnosis present

## 2012-11-26 DIAGNOSIS — H409 Unspecified glaucoma: Secondary | ICD-10-CM | POA: Diagnosis present

## 2012-11-26 DIAGNOSIS — R3912 Poor urinary stream: Secondary | ICD-10-CM | POA: Diagnosis not present

## 2012-11-26 DIAGNOSIS — Z01810 Encounter for preprocedural cardiovascular examination: Secondary | ICD-10-CM | POA: Diagnosis not present

## 2012-11-26 DIAGNOSIS — M722 Plantar fascial fibromatosis: Secondary | ICD-10-CM | POA: Diagnosis not present

## 2012-11-26 DIAGNOSIS — Y92009 Unspecified place in unspecified non-institutional (private) residence as the place of occurrence of the external cause: Secondary | ICD-10-CM | POA: Diagnosis not present

## 2012-11-26 DIAGNOSIS — R42 Dizziness and giddiness: Secondary | ICD-10-CM | POA: Diagnosis present

## 2012-11-26 DIAGNOSIS — G5601 Carpal tunnel syndrome, right upper limb: Secondary | ICD-10-CM | POA: Diagnosis not present

## 2012-11-26 DIAGNOSIS — R609 Edema, unspecified: Secondary | ICD-10-CM | POA: Diagnosis not present

## 2012-11-26 DIAGNOSIS — E039 Hypothyroidism, unspecified: Secondary | ICD-10-CM | POA: Diagnosis present

## 2012-11-26 DIAGNOSIS — I517 Cardiomegaly: Secondary | ICD-10-CM | POA: Diagnosis not present

## 2012-11-26 DIAGNOSIS — I5042 Chronic combined systolic (congestive) and diastolic (congestive) heart failure: Secondary | ICD-10-CM | POA: Diagnosis present

## 2012-11-26 DIAGNOSIS — S72011A Unspecified intracapsular fracture of right femur, initial encounter for closed fracture: Secondary | ICD-10-CM | POA: Diagnosis present

## 2012-11-26 DIAGNOSIS — I071 Rheumatic tricuspid insufficiency: Secondary | ICD-10-CM | POA: Diagnosis not present

## 2012-11-26 DIAGNOSIS — Z823 Family history of stroke: Secondary | ICD-10-CM | POA: Diagnosis not present

## 2012-11-26 DIAGNOSIS — E669 Obesity, unspecified: Secondary | ICD-10-CM | POA: Diagnosis present

## 2012-11-26 DIAGNOSIS — I350 Nonrheumatic aortic (valve) stenosis: Secondary | ICD-10-CM | POA: Diagnosis present

## 2012-11-26 DIAGNOSIS — Z20828 Contact with and (suspected) exposure to other viral communicable diseases: Secondary | ICD-10-CM | POA: Diagnosis not present

## 2012-11-26 DIAGNOSIS — Z9989 Dependence on other enabling machines and devices: Secondary | ICD-10-CM | POA: Diagnosis not present

## 2012-11-26 DIAGNOSIS — R202 Paresthesia of skin: Secondary | ICD-10-CM | POA: Diagnosis not present

## 2012-11-26 DIAGNOSIS — Z7901 Long term (current) use of anticoagulants: Secondary | ICD-10-CM | POA: Diagnosis not present

## 2012-11-26 DIAGNOSIS — Z01812 Encounter for preprocedural laboratory examination: Secondary | ICD-10-CM | POA: Diagnosis not present

## 2012-11-26 DIAGNOSIS — I083 Combined rheumatic disorders of mitral, aortic and tricuspid valves: Secondary | ICD-10-CM | POA: Diagnosis not present

## 2012-11-26 DIAGNOSIS — N138 Other obstructive and reflux uropathy: Secondary | ICD-10-CM | POA: Diagnosis not present

## 2012-11-26 DIAGNOSIS — I429 Cardiomyopathy, unspecified: Secondary | ICD-10-CM | POA: Diagnosis present

## 2012-11-26 DIAGNOSIS — M25569 Pain in unspecified knee: Secondary | ICD-10-CM | POA: Diagnosis not present

## 2012-11-26 DIAGNOSIS — N401 Enlarged prostate with lower urinary tract symptoms: Secondary | ICD-10-CM | POA: Diagnosis not present

## 2012-11-26 DIAGNOSIS — Z8249 Family history of ischemic heart disease and other diseases of the circulatory system: Secondary | ICD-10-CM | POA: Diagnosis not present

## 2012-11-26 DIAGNOSIS — I4430 Unspecified atrioventricular block: Secondary | ICD-10-CM | POA: Diagnosis not present

## 2012-11-26 DIAGNOSIS — R040 Epistaxis: Secondary | ICD-10-CM | POA: Diagnosis present

## 2012-11-26 DIAGNOSIS — I2510 Atherosclerotic heart disease of native coronary artery without angina pectoris: Secondary | ICD-10-CM | POA: Diagnosis present

## 2012-11-26 DIAGNOSIS — I5032 Chronic diastolic (congestive) heart failure: Secondary | ICD-10-CM | POA: Diagnosis not present

## 2012-11-26 DIAGNOSIS — Z862 Personal history of diseases of the blood and blood-forming organs and certain disorders involving the immune mechanism: Secondary | ICD-10-CM | POA: Diagnosis not present

## 2012-11-26 DIAGNOSIS — Z79899 Other long term (current) drug therapy: Secondary | ICD-10-CM | POA: Diagnosis not present

## 2012-11-26 DIAGNOSIS — G5603 Carpal tunnel syndrome, bilateral upper limbs: Secondary | ICD-10-CM | POA: Diagnosis present

## 2012-11-26 DIAGNOSIS — G4733 Obstructive sleep apnea (adult) (pediatric): Secondary | ICD-10-CM | POA: Diagnosis present

## 2012-11-26 DIAGNOSIS — E782 Mixed hyperlipidemia: Secondary | ICD-10-CM | POA: Diagnosis not present

## 2012-11-26 DIAGNOSIS — G4730 Sleep apnea, unspecified: Secondary | ICD-10-CM | POA: Diagnosis not present

## 2012-11-26 DIAGNOSIS — Z5321 Procedure and treatment not carried out due to patient leaving prior to being seen by health care provider: Secondary | ICD-10-CM | POA: Diagnosis not present

## 2012-11-26 DIAGNOSIS — I255 Ischemic cardiomyopathy: Secondary | ICD-10-CM | POA: Diagnosis not present

## 2012-11-26 DIAGNOSIS — E559 Vitamin D deficiency, unspecified: Secondary | ICD-10-CM | POA: Diagnosis not present

## 2012-11-26 DIAGNOSIS — M84375D Stress fracture, left foot, subsequent encounter for fracture with routine healing: Secondary | ICD-10-CM | POA: Diagnosis not present

## 2012-11-26 DIAGNOSIS — G5602 Carpal tunnel syndrome, left upper limb: Secondary | ICD-10-CM | POA: Diagnosis not present

## 2012-11-26 DIAGNOSIS — R931 Abnormal findings on diagnostic imaging of heart and coronary circulation: Secondary | ICD-10-CM | POA: Diagnosis not present

## 2012-11-26 DIAGNOSIS — M179 Osteoarthritis of knee, unspecified: Secondary | ICD-10-CM | POA: Diagnosis present

## 2012-11-26 DIAGNOSIS — M1990 Unspecified osteoarthritis, unspecified site: Secondary | ICD-10-CM | POA: Diagnosis not present

## 2012-11-26 DIAGNOSIS — K219 Gastro-esophageal reflux disease without esophagitis: Secondary | ICD-10-CM | POA: Diagnosis not present

## 2012-11-26 DIAGNOSIS — S72001A Fracture of unspecified part of neck of right femur, initial encounter for closed fracture: Secondary | ICD-10-CM | POA: Diagnosis present

## 2012-11-26 DIAGNOSIS — I4891 Unspecified atrial fibrillation: Secondary | ICD-10-CM | POA: Diagnosis present

## 2012-11-26 DIAGNOSIS — I130 Hypertensive heart and chronic kidney disease with heart failure and stage 1 through stage 4 chronic kidney disease, or unspecified chronic kidney disease: Secondary | ICD-10-CM | POA: Diagnosis present

## 2012-11-26 DIAGNOSIS — Z87891 Personal history of nicotine dependence: Secondary | ICD-10-CM | POA: Diagnosis not present

## 2012-11-26 DIAGNOSIS — F419 Anxiety disorder, unspecified: Secondary | ICD-10-CM | POA: Diagnosis not present

## 2012-11-26 DIAGNOSIS — I10 Essential (primary) hypertension: Secondary | ICD-10-CM | POA: Diagnosis not present

## 2012-11-26 DIAGNOSIS — R0789 Other chest pain: Secondary | ICD-10-CM | POA: Diagnosis present

## 2012-11-26 DIAGNOSIS — D696 Thrombocytopenia, unspecified: Secondary | ICD-10-CM | POA: Diagnosis present

## 2012-11-26 DIAGNOSIS — Z96653 Presence of artificial knee joint, bilateral: Secondary | ICD-10-CM | POA: Diagnosis not present

## 2012-11-26 DIAGNOSIS — N400 Enlarged prostate without lower urinary tract symptoms: Secondary | ICD-10-CM | POA: Diagnosis present

## 2012-11-26 DIAGNOSIS — I25118 Atherosclerotic heart disease of native coronary artery with other forms of angina pectoris: Secondary | ICD-10-CM | POA: Diagnosis not present

## 2012-11-26 DIAGNOSIS — I340 Nonrheumatic mitral (valve) insufficiency: Secondary | ICD-10-CM | POA: Diagnosis not present

## 2012-11-26 DIAGNOSIS — R0609 Other forms of dyspnea: Secondary | ICD-10-CM | POA: Diagnosis not present

## 2012-11-26 DIAGNOSIS — N179 Acute kidney failure, unspecified: Secondary | ICD-10-CM | POA: Diagnosis not present

## 2012-11-26 DIAGNOSIS — R3914 Feeling of incomplete bladder emptying: Secondary | ICD-10-CM | POA: Diagnosis not present

## 2012-11-26 DIAGNOSIS — R079 Chest pain, unspecified: Secondary | ICD-10-CM | POA: Diagnosis present

## 2012-11-26 DIAGNOSIS — R351 Nocturia: Secondary | ICD-10-CM | POA: Diagnosis not present

## 2012-11-26 DIAGNOSIS — E785 Hyperlipidemia, unspecified: Secondary | ICD-10-CM | POA: Diagnosis present

## 2012-11-26 DIAGNOSIS — R7989 Other specified abnormal findings of blood chemistry: Secondary | ICD-10-CM | POA: Diagnosis not present

## 2012-11-26 DIAGNOSIS — I472 Ventricular tachycardia: Secondary | ICD-10-CM | POA: Diagnosis not present

## 2012-11-26 DIAGNOSIS — I441 Atrioventricular block, second degree: Secondary | ICD-10-CM | POA: Diagnosis present

## 2012-11-26 DIAGNOSIS — M1711 Unilateral primary osteoarthritis, right knee: Secondary | ICD-10-CM | POA: Diagnosis not present

## 2012-11-26 DIAGNOSIS — R350 Frequency of micturition: Secondary | ICD-10-CM | POA: Diagnosis not present

## 2012-11-26 DIAGNOSIS — Z96649 Presence of unspecified artificial hip joint: Secondary | ICD-10-CM | POA: Diagnosis not present

## 2012-11-26 DIAGNOSIS — K449 Diaphragmatic hernia without obstruction or gangrene: Secondary | ICD-10-CM | POA: Diagnosis not present

## 2012-11-26 DIAGNOSIS — R3911 Hesitancy of micturition: Secondary | ICD-10-CM | POA: Diagnosis not present

## 2012-11-26 HISTORY — PX: TOTAL KNEE ARTHROPLASTY: SHX125

## 2012-11-26 LAB — TYPE AND SCREEN: ABO/RH(D): O POS

## 2012-11-26 SURGERY — ARTHROPLASTY, KNEE, TOTAL
Anesthesia: General | Site: Knee | Laterality: Left | Wound class: Clean

## 2012-11-26 MED ORDER — METOCLOPRAMIDE HCL 5 MG/ML IJ SOLN: 5.0000 mg | Freq: Three times a day (TID) | INTRAMUSCULAR | Status: DC | PRN

## 2012-11-26 MED ORDER — LACTATED RINGERS IV SOLN: INTRAVENOUS | Status: DC

## 2012-11-26 MED ORDER — BUPIVACAINE LIPOSOME 1.3 % IJ SUSP
20.0000 mL | Freq: Once | INTRAMUSCULAR | Status: AC
  Administered 2012-11-26: 20 mL
  Filled 2012-11-26: qty 20

## 2012-11-26 MED ORDER — SODIUM CHLORIDE 0.9 % IV SOLN
INTRAVENOUS | Status: DC
  Administered 2012-11-26 (×2): via INTRAVENOUS
  Administered 2012-11-27: 20 mL/h via INTRAVENOUS

## 2012-11-26 MED ORDER — SODIUM CHLORIDE 0.9 % IV SOLN: INTRAVENOUS | Status: DC

## 2012-11-26 MED ORDER — POLYETHYLENE GLYCOL 3350 17 G PO PACK: 17.0000 g | PACK | Freq: Every day | ORAL | Status: DC | PRN

## 2012-11-26 MED ORDER — MENTHOL 3 MG MT LOZG: 1.0000 | LOZENGE | OROMUCOSAL | Status: DC | PRN

## 2012-11-26 MED ORDER — GLYCOPYRROLATE 0.2 MG/ML IJ SOLN
INTRAMUSCULAR | Status: DC | PRN
  Administered 2012-11-26: .7 mg via INTRAVENOUS

## 2012-11-26 MED ORDER — ONDANSETRON HCL 4 MG/2ML IJ SOLN
INTRAMUSCULAR | Status: DC | PRN
  Administered 2012-11-26: 4 mg via INTRAVENOUS

## 2012-11-26 MED ORDER — BISACODYL 10 MG RE SUPP: 10.0000 mg | Freq: Every day | RECTAL | Status: DC | PRN

## 2012-11-26 MED ORDER — METOCLOPRAMIDE HCL 10 MG PO TABS: 5.0000 mg | ORAL_TABLET | Freq: Three times a day (TID) | ORAL | Status: DC | PRN

## 2012-11-26 MED ORDER — ONDANSETRON HCL 4 MG/2ML IJ SOLN: 4.0000 mg | Freq: Four times a day (QID) | INTRAMUSCULAR | Status: DC | PRN

## 2012-11-26 MED ORDER — DIPHENHYDRAMINE HCL 12.5 MG/5ML PO ELIX: 12.5000 mg | ORAL_SOLUTION | ORAL | Status: DC | PRN

## 2012-11-26 MED ORDER — HYDROCHLOROTHIAZIDE 25 MG PO TABS: 12.5000 mg | ORAL_TABLET | Freq: Every day | ORAL | Status: DC

## 2012-11-26 MED ORDER — SODIUM CHLORIDE 0.9 % IJ SOLN
INTRAMUSCULAR | Status: DC | PRN
  Administered 2012-11-26: 50 mL via INTRAVENOUS

## 2012-11-26 MED ORDER — METHOCARBAMOL 100 MG/ML IJ SOLN
500.0000 mg | Freq: Four times a day (QID) | INTRAVENOUS | Status: DC | PRN
  Filled 2012-11-26: qty 5

## 2012-11-26 MED ORDER — BUPIVACAINE ON-Q PAIN PUMP (FOR ORDER SET NO CHG)
INJECTION | Status: DC
  Filled 2012-11-26: qty 1

## 2012-11-26 MED ORDER — ACETAMINOPHEN 650 MG RE SUPP: 650.0000 mg | Freq: Four times a day (QID) | RECTAL | Status: DC | PRN

## 2012-11-26 MED ORDER — FLEET ENEMA 7-19 GM/118ML RE ENEM: 1.0000 | ENEMA | Freq: Once | RECTAL | Status: AC | PRN

## 2012-11-26 MED ORDER — LIDOCAINE HCL (CARDIAC) 20 MG/ML IV SOLN
INTRAVENOUS | Status: DC | PRN
  Administered 2012-11-26: 50 mg via INTRAVENOUS

## 2012-11-26 MED ORDER — DEXAMETHASONE 6 MG PO TABS
10.0000 mg | ORAL_TABLET | Freq: Once | ORAL | Status: AC
  Administered 2012-11-27: 10 mg via ORAL
  Filled 2012-11-26: qty 1

## 2012-11-26 MED ORDER — ACETAMINOPHEN 325 MG PO TABS: 650.0000 mg | ORAL_TABLET | Freq: Four times a day (QID) | ORAL | Status: DC | PRN

## 2012-11-26 MED ORDER — NEOSTIGMINE METHYLSULFATE 1 MG/ML IJ SOLN
INTRAMUSCULAR | Status: DC | PRN
  Administered 2012-11-26: 4 mg via INTRAVENOUS

## 2012-11-26 MED ORDER — METHOCARBAMOL 500 MG PO TABS
500.0000 mg | ORAL_TABLET | Freq: Four times a day (QID) | ORAL | Status: DC | PRN
  Administered 2012-11-27 – 2012-11-28 (×2): 500 mg via ORAL
  Filled 2012-11-26 (×2): qty 1

## 2012-11-26 MED ORDER — VANCOMYCIN HCL 10 G IV SOLR
1500.0000 mg | INTRAVENOUS | Status: AC
  Administered 2012-11-26: 1500 mg via INTRAVENOUS
  Filled 2012-11-26: qty 1500

## 2012-11-26 MED ORDER — VANCOMYCIN HCL IN DEXTROSE 1-5 GM/200ML-% IV SOLN
1000.0000 mg | Freq: Two times a day (BID) | INTRAVENOUS | Status: AC
  Administered 2012-11-26: 1000 mg via INTRAVENOUS
  Filled 2012-11-26: qty 200

## 2012-11-26 MED ORDER — FENTANYL CITRATE 0.05 MG/ML IJ SOLN
INTRAMUSCULAR | Status: DC | PRN
  Administered 2012-11-26: 50 ug via INTRAVENOUS
  Administered 2012-11-26 (×2): 100 ug via INTRAVENOUS

## 2012-11-26 MED ORDER — OXYCODONE HCL 5 MG PO TABS
5.0000 mg | ORAL_TABLET | ORAL | Status: DC | PRN
  Administered 2012-11-26 – 2012-11-28 (×10): 10 mg via ORAL
  Filled 2012-11-26 (×11): qty 2

## 2012-11-26 MED ORDER — PROPOFOL 10 MG/ML IV BOLUS
INTRAVENOUS | Status: DC | PRN
  Administered 2012-11-26: 150 mg via INTRAVENOUS

## 2012-11-26 MED ORDER — LABETALOL HCL 5 MG/ML IV SOLN
INTRAVENOUS | Status: DC | PRN
  Administered 2012-11-26: 5 mg via INTRAVENOUS

## 2012-11-26 MED ORDER — DOCUSATE SODIUM 100 MG PO CAPS
100.0000 mg | ORAL_CAPSULE | Freq: Two times a day (BID) | ORAL | Status: DC
  Administered 2012-11-26 – 2012-11-28 (×4): 100 mg via ORAL

## 2012-11-26 MED ORDER — ROCURONIUM BROMIDE 100 MG/10ML IV SOLN
INTRAVENOUS | Status: DC | PRN
  Administered 2012-11-26: 30 mg via INTRAVENOUS

## 2012-11-26 MED ORDER — PHENOL 1.4 % MT LIQD: 1.0000 | OROMUCOSAL | Status: DC | PRN

## 2012-11-26 MED ORDER — DEXAMETHASONE SODIUM PHOSPHATE 10 MG/ML IJ SOLN: 10.0000 mg | Freq: Once | INTRAMUSCULAR | Status: AC

## 2012-11-26 MED ORDER — MORPHINE SULFATE 2 MG/ML IJ SOLN
1.0000 mg | INTRAMUSCULAR | Status: DC | PRN
  Administered 2012-11-26: 1 mg via INTRAVENOUS
  Filled 2012-11-26: qty 1

## 2012-11-26 MED ORDER — ONDANSETRON HCL 4 MG PO TABS: 4.0000 mg | ORAL_TABLET | Freq: Four times a day (QID) | ORAL | Status: DC | PRN

## 2012-11-26 MED ORDER — ACETAMINOPHEN 10 MG/ML IV SOLN
1000.0000 mg | Freq: Once | INTRAVENOUS | Status: DC
Start: 2012-11-26 — End: 2012-11-26

## 2012-11-26 MED ORDER — FENTANYL CITRATE 0.05 MG/ML IJ SOLN
50.0000 ug | INTRAMUSCULAR | Status: DC | PRN
  Administered 2012-11-26 (×2): 50 ug via INTRAVENOUS

## 2012-11-26 MED ORDER — TRAMADOL HCL 50 MG PO TABS: 50.0000 mg | ORAL_TABLET | Freq: Four times a day (QID) | ORAL | Status: DC | PRN

## 2012-11-26 MED ORDER — HYDROCHLOROTHIAZIDE 12.5 MG PO CAPS
12.5000 mg | ORAL_CAPSULE | Freq: Every day | ORAL | Status: DC
  Administered 2012-11-27 – 2012-11-28 (×2): 12.5 mg via ORAL
  Filled 2012-11-26 (×2): qty 1

## 2012-11-26 MED ORDER — LACTATED RINGERS IV SOLN
INTRAVENOUS | Status: DC | PRN
  Administered 2012-11-26 (×2): via INTRAVENOUS

## 2012-11-26 MED ORDER — MORPHINE SULFATE 10 MG/ML IJ SOLN
1.0000 mg | INTRAMUSCULAR | Status: DC | PRN
  Administered 2012-11-26 (×2): 2 mg via INTRAVENOUS

## 2012-11-26 MED ORDER — RIVAROXABAN 10 MG PO TABS
10.0000 mg | ORAL_TABLET | Freq: Every day | ORAL | Status: DC
  Administered 2012-11-27 – 2012-11-28 (×2): 10 mg via ORAL
  Filled 2012-11-26 (×3): qty 1

## 2012-11-26 MED ORDER — ACETAMINOPHEN 10 MG/ML IV SOLN
1000.0000 mg | Freq: Four times a day (QID) | INTRAVENOUS | Status: AC
  Administered 2012-11-26 – 2012-11-27 (×4): 1000 mg via INTRAVENOUS
  Filled 2012-11-26 (×6): qty 100

## 2012-11-26 SURGICAL SUPPLY — 55 items
BAG SPEC THK2 15X12 ZIP CLS (MISCELLANEOUS) ×1
BAG ZIPLOCK 12X15 (MISCELLANEOUS) ×2 IMPLANT
BANDAGE ELASTIC 6 VELCRO ST LF (GAUZE/BANDAGES/DRESSINGS) ×2 IMPLANT
BANDAGE ESMARK 6X9 LF (GAUZE/BANDAGES/DRESSINGS) ×1 IMPLANT
BLADE SAG 18X100X1.27 (BLADE) ×2 IMPLANT
BLADE SAW SGTL 11.0X1.19X90.0M (BLADE) ×2 IMPLANT
BNDG CMPR 9X6 STRL LF SNTH (GAUZE/BANDAGES/DRESSINGS) ×1
BNDG ESMARK 6X9 LF (GAUZE/BANDAGES/DRESSINGS) ×2
BOWL SMART MIX CTS (DISPOSABLE) ×2 IMPLANT
CATH KIT ON-Q SILVERSOAK 5IN (CATHETERS) ×2 IMPLANT
CEMENT HV SMART SET (Cement) ×3 IMPLANT
CLOTH BEACON ORANGE TIMEOUT ST (SAFETY) ×2 IMPLANT
CUFF TOURN SGL QUICK 34 (TOURNIQUET CUFF) ×2
CUFF TRNQT CYL 34X4X40X1 (TOURNIQUET CUFF) ×1 IMPLANT
DRAPE EXTREMITY T 121X128X90 (DRAPE) ×2 IMPLANT
DRAPE POUCH INSTRU U-SHP 10X18 (DRAPES) ×2 IMPLANT
DRAPE U-SHAPE 47X51 STRL (DRAPES) ×2 IMPLANT
DRSG ADAPTIC 3X8 NADH LF (GAUZE/BANDAGES/DRESSINGS) ×2 IMPLANT
DRSG PAD ABDOMINAL 8X10 ST (GAUZE/BANDAGES/DRESSINGS) ×2 IMPLANT
DURAPREP 26ML APPLICATOR (WOUND CARE) ×2 IMPLANT
ELECT REM PT RETURN 9FT ADLT (ELECTROSURGICAL) ×2
ELECTRODE REM PT RTRN 9FT ADLT (ELECTROSURGICAL) ×1 IMPLANT
EVACUATOR 1/8 PVC DRAIN (DRAIN) ×2 IMPLANT
FACESHIELD LNG OPTICON STERILE (SAFETY) ×10 IMPLANT
GAUZE SPONGE 2X2 8PLY STRL LF (GAUZE/BANDAGES/DRESSINGS) IMPLANT
GLOVE BIO SURGEON STRL SZ8 (GLOVE) ×2 IMPLANT
GLOVE BIOGEL PI IND STRL 8 (GLOVE) ×2 IMPLANT
GLOVE BIOGEL PI INDICATOR 8 (GLOVE) ×2
GLOVE ECLIPSE 8.0 STRL XLNG CF (GLOVE) ×2 IMPLANT
GLOVE SURG SS PI 6.5 STRL IVOR (GLOVE) ×4 IMPLANT
GOWN STRL NON-REIN LRG LVL3 (GOWN DISPOSABLE) ×4 IMPLANT
GOWN STRL REIN XL XLG (GOWN DISPOSABLE) ×2 IMPLANT
HANDPIECE INTERPULSE COAX TIP (DISPOSABLE) ×2
IMMOBILIZER KNEE 20 (SOFTGOODS) ×2
IMMOBILIZER KNEE 20 THIGH 36 (SOFTGOODS) ×1 IMPLANT
KIT BASIN OR (CUSTOM PROCEDURE TRAY) ×2 IMPLANT
MANIFOLD NEPTUNE II (INSTRUMENTS) ×2 IMPLANT
NS IRRIG 1000ML POUR BTL (IV SOLUTION) ×2 IMPLANT
PACK TOTAL JOINT (CUSTOM PROCEDURE TRAY) ×2 IMPLANT
PAD ABD 7.5X8 STRL (GAUZE/BANDAGES/DRESSINGS) ×2 IMPLANT
PADDING CAST COTTON 6X4 STRL (CAST SUPPLIES) ×6 IMPLANT
POSITIONER SURGICAL ARM (MISCELLANEOUS) ×2 IMPLANT
SET HNDPC FAN SPRY TIP SCT (DISPOSABLE) ×1 IMPLANT
SPONGE GAUZE 2X2 STER 10/PKG (GAUZE/BANDAGES/DRESSINGS) ×1
SPONGE GAUZE 4X4 12PLY (GAUZE/BANDAGES/DRESSINGS) ×2 IMPLANT
STRIP CLOSURE SKIN 1/2X4 (GAUZE/BANDAGES/DRESSINGS) ×4 IMPLANT
SUCTION FRAZIER 12FR DISP (SUCTIONS) ×2 IMPLANT
SUT MNCRL AB 4-0 PS2 18 (SUTURE) ×2 IMPLANT
SUT VIC AB 2-0 CT1 27 (SUTURE) ×6
SUT VIC AB 2-0 CT1 TAPERPNT 27 (SUTURE) ×3 IMPLANT
SUT VLOC 180 0 24IN GS25 (SUTURE) ×2 IMPLANT
TOWEL OR 17X26 10 PK STRL BLUE (TOWEL DISPOSABLE) ×4 IMPLANT
TRAY FOLEY CATH 14FRSI W/METER (CATHETERS) ×2 IMPLANT
WATER STERILE IRR 1500ML POUR (IV SOLUTION) ×2 IMPLANT
WRAP KNEE MAXI GEL POST OP (GAUZE/BANDAGES/DRESSINGS) ×4 IMPLANT

## 2012-11-26 NOTE — Anesthesia Preprocedure Evaluation (Signed)
Anesthesia Evaluation  Patient identified by MRN, date of birth, ID band Patient awake    Reviewed: Allergy & Precautions, H&P , NPO status , Patient's Chart, lab work & pertinent test results  History of Anesthesia Complications (+) PONV  Airway Mallampati: II TM Distance: >3 FB Neck ROM: full    Dental No notable dental hx. (+) Poor Dentition, Missing, Chipped and Dental Advisory Given,    Pulmonary sleep apnea ,  breath sounds clear to auscultation  Pulmonary exam normal       Cardiovascular hypertension, + pacemaker Rhythm:regular Rate:Normal  Mobitz type 2 2nd degree AV block.  History syncope.  Pacemaker fixed these problems.   Neuro/Psych negative neurological ROS  negative psych ROS   GI/Hepatic negative GI ROS, Neg liver ROS,   Endo/Other  negative endocrine ROS  Renal/GU negative Renal ROS  negative genitourinary   Musculoskeletal   Abdominal   Peds  Hematology negative hematology ROS (+)   Anesthesia Other Findings   Reproductive/Obstetrics negative OB ROS                           Anesthesia Physical Anesthesia Plan  ASA: III  Anesthesia Plan: General   Post-op Pain Management:    Induction: Intravenous  Airway Management Planned: Oral ETT  Additional Equipment:   Intra-op Plan:   Post-operative Plan: Extubation in OR  Informed Consent: I have reviewed the patients History and Physical, chart, labs and discussed the procedure including the risks, benefits and alternatives for the proposed anesthesia with the patient or authorized representative who has indicated his/her understanding and acceptance.   Dental Advisory Given  Plan Discussed with: CRNA and Surgeon  Anesthesia Plan Comments:         Anesthesia Quick Evaluation

## 2012-11-26 NOTE — Interval H&P Note (Signed)
History and Physical Interval Note:  11/26/2012 8:16 AM  Gregory Mccormick  has presented today for surgery, with the diagnosis of OA LEFT KNEE   The various methods of treatment have been discussed with the patient and family. After consideration of risks, benefits and other options for treatment, the patient has consented to  Procedure(s) (LRB) with comments: TOTAL KNEE ARTHROPLASTY (Left) as a surgical intervention .  The patient's history has been reviewed, patient examined, no change in status, stable for surgery.  I have reviewed the patient's chart and labs.  Questions were answered to the patient's satisfaction.     Loanne Drilling

## 2012-11-26 NOTE — Anesthesia Postprocedure Evaluation (Signed)
  Anesthesia Post-op Note  Patient: Gregory Mccormick  Procedure(s) Performed: Procedure(s) (LRB): TOTAL KNEE ARTHROPLASTY (Left)  Patient Location: PACU  Anesthesia Type: General  Level of Consciousness: awake and alert   Airway and Oxygen Therapy: Patient Spontanous Breathing  Post-op Pain: mild  Post-op Assessment: Post-op Vital signs reviewed, Patient's Cardiovascular Status Stable, Respiratory Function Stable, Patent Airway and No signs of Nausea or vomiting  Last Vitals:  Filed Vitals:   11/26/12 1500  BP: 103/69  Pulse: 87  Temp: 36.5 C  Resp: 16    Post-op Vital Signs: stable   Complications: No apparent anesthesia complications

## 2012-11-26 NOTE — Progress Notes (Signed)
UR COMPLETED  

## 2012-11-26 NOTE — Evaluation (Signed)
Physical Therapy Evaluation Patient Details Name: Gregory Mccormick MRN: [id number] DOB: Sep 25, 1929 Today's Date: 11/26/2012 Time: 4098-1191 PT Time Calculation (min): 41 min  PT Assessment / Plan / Recommendation Clinical Impression  79 yo male s/p L TKA-POD 0. Min assist for bed mobility. Sat EOB ~5-7 minutes with Min-guard assist. Pt c/o feeling "woozy, nauseous". Attempted to get BP while sitting EOB but dynamap would not read. Pt symptomatic (slower to respond, dizziness worsened).  Assisted pt back to supine-BP 103/69.. -RN notified. Recommend HHPT with 24 hour supervision.     PT Assessment  Patient needs continued PT services    Follow Up Recommendations  Home health PT;Supervision/Assistance - 24 hour    Does the patient have the potential to tolerate intense rehabilitation      Barriers to Discharge        Equipment Recommendations  None recommended by PT    Recommendations for Other Services OT consult   Frequency 7X/week    Precautions / Restrictions Precautions Precautions: Knee Required Braces or Orthoses: Knee Immobilizer - Left Knee Immobilizer - Left: Discontinue once straight leg raise with < 10 degree lag Restrictions Weight Bearing Restrictions: No LLE Weight Bearing: Weight bearing as tolerated   Pertinent Vitals/Pain L knee 4/10       Mobility  Bed Mobility Bed Mobility: Supine to Sit;Sit to Supine Supine to Sit: 4: Min assist Sit to Supine: 4: Min assist Details for Bed Mobility Assistance: Assist for L LE off/onto bed. VCs safety, technique, hand placement Transfers Transfers: Not assessed Ambulation/Gait Ambulation/Gait Assistance: Not tested (comment)    Shoulder Instructions     Exercises     PT Diagnosis: Acute pain;Difficulty walking  PT Problem List: Decreased strength;Decreased range of motion;Decreased mobility;Pain;Decreased knowledge of use of DME;Decreased knowledge of precautions PT Treatment Interventions: DME instruction;Gait  training;Stair training;Functional mobility training;Therapeutic activities;Therapeutic exercise;Patient/family education   PT Goals Acute Rehab PT Goals PT Goal Formulation: With patient/family Time For Goal Achievement: 12/03/12 Potential to Achieve Goals: Good Pt will go Supine/Side to Sit: with supervision PT Goal: Supine/Side to Sit - Progress: Goal set today Pt will go Sit to Supine/Side: with supervision PT Goal: Sit to Supine/Side - Progress: Goal set today Pt will go Sit to Stand: with supervision PT Goal: Sit to Stand - Progress: Goal set today Pt will Ambulate: 51 - 150 feet;with supervision;with rolling walker PT Goal: Ambulate - Progress: Goal set today Pt will Go Up / Down Stairs: 1-2 stairs;with supervision;with rolling walker PT Goal: Up/Down Stairs - Progress: Goal set today Pt will Perform Home Exercise Program: with supervision, verbal cues required/provided PT Goal: Perform Home Exercise Program - Progress: Goal set today  Visit Information  Last PT Received On: 11/26/12 Assistance Needed:  (+1.5 safety due to low BP)    Subjective Data  Subjective: "Let's do it" Patient Stated Goal: Home   Prior Functioning  Home Living Lives With: Spouse Available Help at Discharge: Family Type of Home: House Home Access: Stairs to enter Secretary/administrator of Steps: 1 Entrance Stairs-Rails: None Home Layout: Laundry or work area in basement [employer] Shower/Tub: Walk-in shower (1 step into) Firefighter: Standard Home Adaptive Equipment: Walker - rolling;Bedside commode/3-in-1 Prior Function Level of Independence: Independent Able to Take Stairs?: Yes Driving: Yes Communication Communication: HOH    Cognition  Overall Cognitive Status: Appears within functional limits for tasks assessed/performed Arousal/Alertness: Awake/alert Orientation Level: Appears intact for tasks assessed Behavior During Session: [facility] for tasks performed    Extremity/Trunk  Assessment  Right Lower Extremity Assessment RLE ROM/Strength/Tone: Deficits RLE ROM/Strength/Tone Deficits: Knee flex ~ 80 sitting EOB RLE Sensation: WFL - Light Touch Left Lower Extremity Assessment LLE ROM/Strength/Tone: WFL for tasks assessed Trunk Assessment Trunk Assessment: Normal   Balance    End of Session PT - End of Session Equipment Utilized During Treatment: Gait belt;Left knee immobilizer Activity Tolerance: Other (comment) (Limited by low BP-pt symptomatic) Patient left: in bed;with call bell/phone within reach;with family/visitor present  GP     Rebeca Alert [facility] 11/26/2012, 4:15 PM [phone]

## 2012-11-26 NOTE — Transfer of Care (Signed)
Immediate Anesthesia Transfer of Care Note  Patient: Gregory Mccormick  Procedure(s) Performed: Procedure(s) (LRB): TOTAL KNEE ARTHROPLASTY (Left)  Patient Location: PACU  Anesthesia Type: General  Level of Consciousness: sedated, patient cooperative and responds to stimulaton  Airway & Oxygen Therapy: Patient Spontanous Breathing and Patient connected to face mask oxgen  Post-op Assessment: Report given to PACU RN and Post -op Vital signs reviewed and stable  Post vital signs: Reviewed and stable  Complications: No apparent anesthesia complications

## 2012-11-26 NOTE — Plan of Care (Signed)
Problem: Consults Goal: Diagnosis- Total Joint Replacement Left total knee     

## 2012-11-26 NOTE — Op Note (Signed)
Pre-operative diagnosis- Osteoarthritis  Left knee(s)  Post-operative diagnosis- Osteoarthritis Left knee(s)  Procedure-  Left  Total Knee Arthroplasty  Surgeon- Gregory Rankin. Maitland Muhlbauer, MD  Assistant- Dimitri Ped, PA-C   Anesthesia-  General EBL-* No blood loss amount entered *  Drains Hemovac  Tourniquet time-34 minutes @ 300 mm Hg Complications- None  Condition-PACU - hemodynamically stable.   Brief Clinical Note   Gregory Mccormick is a 79 y.o. year old male with end stage OA of his left knee with progressively worsening pain and dysfunction. He has constant pain, with activity and at rest and significant functional deficits with difficulties even with ADLs. He has had extensive non-op management including analgesics, injections of cortisone and viscosupplements, and home exercise program, but remains in significant pain with significant dysfunction. Radiographs show bone on bone arthritis medial and patellofemoral. He presents now for left Total Knee Arthroplasty.     Procedure in detail---   The patient is brought into the operating room and positioned supine on the operating table. After successful administration of  General,   a tourniquet is placed high on the  Left thigh(s) and the lower extremity is prepped and draped in the usual sterile fashion. Time out is performed by the operating team and then the  Left lower extremity is wrapped in Esmarch, knee flexed and the tourniquet inflated to 300 mmHg.       A midline incision is made with a ten blade through the subcutaneous tissue to the level of the extensor mechanism. A fresh blade is used to make a medial parapatellar arthrotomy. Soft tissue over the proximal medial tibia is subperiosteally elevated to the joint line with a knife and into the semimembranosus bursa with a Cobb elevator. Soft tissue over the proximal lateral tibia is elevated with attention being paid to avoiding the patellar tendon on the tibial tubercle. The patella  is everted, knee flexed 90 degrees and the ACL and PCL are removed. Findings are bone on bone medial and patellofemoral with large medial osteophytes.        The drill is used to create a starting hole in the distal femur and the canal is thoroughly irrigated with sterile saline to remove the fatty contents. The 5 degree Left  valgus alignment guide is placed into the femoral canal and the distal femoral cutting block is pinned to remove 10 mm off the distal femur. Resection is made with an oscillating saw.      The tibia is subluxed forward and the menisci are removed. The extramedullary alignment guide is placed referencing proximally at the medial aspect of the tibial tubercle and distally along the second metatarsal axis and tibial crest. The block is pinned to remove 2mm off the more deficient medial  side. Resection is made with an oscillating saw. Size 4is the most appropriate size for the tibia and the proximal tibia is prepared with the modular drill and keel punch for that size.      The femoral sizing guide is placed and size 5 is most appropriate. Rotation is marked off the epicondylar axis and confirmed by creating a rectangular flexion gap at 90 degrees. The size 5 cutting block is pinned in this rotation and the anterior, posterior and chamfer cuts are made with the oscillating saw. The intercondylar block is then placed and that cut is made.      Trial size 4 tibial component, trial size 5 posterior stabilized femur and a 10  mm posterior stabilized rotating platform  insert trial is placed. Full extension is achieved with excellent varus/valgus and anterior/posterior balance throughout full range of motion. The patella is everted and thickness measured to be 24  mm. Free hand resection is taken to 14 mm, a 38 template is placed, lug holes are drilled, trial patella is placed, and it tracks normally. Osteophytes are removed off the posterior femur with the trial in place. All trials are removed  and the cut bone surfaces prepared with pulsatile lavage. Cement is mixed and once ready for implantation, the size 4 tibial implant, size  5 posterior stabilized femoral component, and the size 38 patella are cemented in place and the patella is held with the clamp. The trial insert is placed and the knee held in full extension. The Exparel (20 ml mixed with 50 ml saline) is injected into the extensor mechanism, posterior capsule, medial and lateral gutters and subcutaneous tissues.  All extruded cement is removed and once the cement is hard the permanent 10 mm posterior stabilized rotating platform insert is placed into the tibial tray.      The wound is copiously irrigated with saline solution and the extensor mechanism closed over a hemovac drain with #1 PDS suture. The tourniquet is released for a total tourniquet time of 34  minutes. Flexion against gravity is 140 degrees and the patella tracks normally. Subcutaneous tissue is closed with 2.0 vicryl and subcuticular with running 4.0 Monocryl. The incision is cleaned and dried and steri-strips and a bulky sterile dressing are applied. The limb is placed into a knee immobilizer and the patient is awakened and transported to recovery in stable condition.      Please note that a surgical assistant was a medical necessity for this procedure in order to perform it in a safe and expeditious manner. Surgical assistant was necessary to retract the ligaments and vital neurovascular structures to prevent injury to them and also necessary for proper positioning of the limb to allow for anatomic placement of the prosthesis.   Gregory Rankin Gaylord Seydel, MD    11/26/2012, 10:15 AM

## 2012-11-27 ENCOUNTER — Inpatient Hospital Stay (HOSPITAL_COMMUNITY): Payer: Medicare Other

## 2012-11-27 ENCOUNTER — Encounter (HOSPITAL_COMMUNITY): Payer: Self-pay | Admitting: Orthopedic Surgery

## 2012-11-27 DIAGNOSIS — D62 Acute posthemorrhagic anemia: Secondary | ICD-10-CM

## 2012-11-27 DIAGNOSIS — E871 Hypo-osmolality and hyponatremia: Secondary | ICD-10-CM

## 2012-11-27 LAB — CBC
HCT: 32.9 % — ABNORMAL LOW (ref 39.0–52.0)
Hemoglobin: 10.9 g/dL — ABNORMAL LOW (ref 13.0–17.0)
MCH: 30.9 pg (ref 26.0–34.0)
MCV: 93.2 fL (ref 78.0–100.0)
Platelets: 165 10*3/uL (ref 150–400)
RBC: 3.53 MIL/uL — ABNORMAL LOW (ref 4.22–5.81)
WBC: 9.4 10*3/uL (ref 4.0–10.5)

## 2012-11-27 LAB — BASIC METABOLIC PANEL
CO2: 26 mEq/L (ref 19–32)
Calcium: 8.1 mg/dL — ABNORMAL LOW (ref 8.4–10.5)
Chloride: 96 mEq/L (ref 96–112)
Creatinine, Ser: 0.97 mg/dL (ref 0.50–1.35)
Glucose, Bld: 131 mg/dL — ABNORMAL HIGH (ref 70–99)

## 2012-11-27 MED ORDER — RIVAROXABAN 10 MG PO TABS: 10.0000 mg | ORAL_TABLET | Freq: Every day | ORAL | Status: DC

## 2012-11-27 MED ORDER — METHOCARBAMOL 500 MG PO TABS: 500.0000 mg | ORAL_TABLET | Freq: Four times a day (QID) | ORAL | Status: DC | PRN

## 2012-11-27 MED ORDER — OXYCODONE HCL 5 MG PO TABS: 5.0000 mg | ORAL_TABLET | ORAL | Status: DC | PRN

## 2012-11-27 NOTE — Progress Notes (Signed)
   Subjective: 1 Day Post-Op Procedure(s) (LRB): TOTAL KNEE ARTHROPLASTY (Left) Patient reports pain as mild and moderate.   Patient seen in rounds with Dr. Lequita Halt. Wife at bedisde. Patient is well, but has had some minor complaints of pain in the knee, requiring pain medications and some nausea. We will start therapy today.  Plan is to go Home after hospital stay.  Objective: Vital signs in last 24 hours: Temp:  [97.5 F (36.4 C)-98.9 F (37.2 C)] 97.9 F (36.6 C) (01/28 0551) Pulse Rate:  [56-90] 66  (01/28 0551) Resp:  [8-18] 16  (01/28 0551) BP: (103-166)/(63-99) 125/66 mmHg (01/28 0551) SpO2:  [95 %-100 %] 99 % (01/28 0551) Weight:  [96.616 kg (213 lb)] 96.616 kg (213 lb) (01/27 1222)  Intake/Output from previous day:  Intake/Output Summary (Last 24 hours) at 11/27/12 0826 Last data filed at 11/27/12 0819  Gross per 24 hour  Intake 4796.67 ml  Output   2210 ml  Net 2586.67 ml    Intake/Output this shift: Total I/O In: 360 [P.O.:360] Out: -   Labs:  Basename 11/27/12 0417  HGB 10.9*    Basename 11/27/12 0417  WBC 9.4  RBC 3.53*  HCT 32.9*  PLT 165    Basename 11/27/12 0417  NA 130*  K 3.9  CL 96  CO2 26  BUN 13  CREATININE 0.97  GLUCOSE 131*  CALCIUM 8.1*   No results found for this basename: LABPT:2,INR:2 in the last 72 hours  EXAM General - Patient is Alert, Appropriate and Oriented Extremity - Neurovascular intact Sensation intact distally Dorsiflexion/Plantar flexion intact Dressing - dressing C/D/I Motor Function - intact, moving foot and toes well on exam.  Hemovac pulled without difficulty.  Past Medical History  Diagnosis Date  . Obesity   . HTN (hypertension)   . Vitamin D deficiency   . History of multiple pulmonary nodules   . Low testosterone   . ED (erectile dysfunction)   . Osteopenia   . OSA on CPAP     does not use cpap, does not like  . Arthritis   . PONV (postoperative nausea and vomiting)      Assessment/Plan: 1 Day Post-Op Procedure(s) (LRB): TOTAL KNEE ARTHROPLASTY (Left) Principal Problem:  *OA (osteoarthritis) of knee Active Problems:  Postop Acute blood loss anemia  Postop Hyponatremia  Estimated Body mass index is 30.56 kg/(m^2) as calculated from the following:   Height as of this encounter: 5\' 10" [taken 11/20/12[(1.778 m).   Weight as of this encounter: 213 lb(96.616 kg). Advance diet Up with therapy Discharge home with home health when improved.  DVT Prophylaxis - Xarelto, ASA 81 mg on hold Weight-Bearing as tolerated to left leg No vaccines. D/C O2 and Pulse OX and try on Room [address]  Patrica Duel 11/27/2012, 8:26 AM

## 2012-11-27 NOTE — Evaluation (Signed)
Occupational Therapy Evaluation Patient Details Name: Gregory Mccormick MRN: [id number] DOB: 1928/11/04 Today's Date: 11/27/2012 Time: 7829-5621 OT Time Calculation (min): 26 min  OT Assessment / Plan / Recommendation Clinical Impression  Pt presents POD 1 LTKR. Skilled OT recommended to maximize independence with BADLs to supervision, min A level in prep for safe d/c home.    OT Assessment  Patient needs continued OT Services    Follow Up Recommendations  Home health OT    Barriers to Discharge      Equipment Recommendations  None recommended by OT    Recommendations for Other Services    Frequency  Min 2X/week    Precautions / Restrictions Precautions Precautions: Knee Required Braces or Orthoses: Knee Immobilizer - Left Knee Immobilizer - Left: Discontinue once straight leg raise with < 10 degree lag Restrictions Weight Bearing Restrictions: No LLE Weight Bearing: Weight bearing as tolerated   Pertinent Vitals/Pain Reported 5/10 pain in L knee. Pt was premedicated prior to session.    ADL  Grooming: Set up Where Assessed - Grooming: Unsupported sitting Upper Body Bathing: Set up Where Assessed - Upper Body Bathing: Unsupported sitting Lower Body Bathing: Minimal assistance Where Assessed - Lower Body Bathing: Supported sit to stand Upper Body Dressing: Set up Where Assessed - Upper Body Dressing: Unsupported sitting Lower Body Dressing: Moderate assistance Where Assessed - Lower Body Dressing: Supported sit to Pharmacist, hospital: Minimal assistance Statistician Method: Sit to Barista: Other (comment) (recliner) Toileting - Clothing Manipulation and Hygiene: Minimal assistance Where Assessed - Engineer, mining and Hygiene: Standing Equipment Used: Rolling walker;Gait belt Transfers/Ambulation Related to ADLs: Min A with RW. ADL Comments: Pt utilized urinal while standing.     OT Diagnosis: Generalized weakness  OT  Problem List: Decreased activity tolerance;Decreased safety awareness;Decreased knowledge of use of DME or AE;Pain OT Treatment Interventions: Self-care/ADL training;Therapeutic activities;DME and/or AE instruction;Patient/family education   OT Goals Acute Rehab OT Goals OT Goal Formulation: With patient/family Time For Goal Achievement: 12/04/12 Potential to Achieve Goals: Good ADL Goals Pt Will Perform Grooming: with supervision;Standing at sink ADL Goal: Grooming - Progress: Goal set today Pt Will Perform Lower Body Bathing: Sit to stand from chair;Sit to stand from bed;with min assist ADL Goal: Lower Body Bathing - Progress: Goal set today Pt Will Perform Lower Body Dressing: Sit to stand from bed;with min assist;Sit to stand from chair ADL Goal: Lower Body Dressing - Progress: Goal set today Pt Will Transfer to Toilet: with supervision;Ambulation;with DME ADL Goal: Toilet Transfer - Progress: Goal set today Pt Will Perform Toileting - Clothing Manipulation: with supervision;Sitting on 3-in-1 or toilet;Standing ADL Goal: Toileting - Clothing Manipulation - Progress: Goal set today Pt Will Perform Toileting - Hygiene: with supervision;Sit to stand from 3-in-1/toilet ADL Goal: Toileting - Hygiene - Progress: Goal set today Pt Will Perform Tub/Shower Transfer: with min assist;Ambulation;Shower transfer ADL Goal: Web designer - Progress: Goal set today  Visit Information  Last OT Received On: 11/27/12 Assistance Needed: +1 PT/OT Co-Evaluation/Treatment: Yes    Subjective Data  Subjective: I had my other knee replaced 7 yrs ago. Patient Stated Goal: Not asked.   Prior Functioning     Home Living Lives With: Spouse Available Help at Discharge: Family Type of Home: House Entrance [place] of Steps: 1 Entrance Stairs-Rails: None Home Layout: Laundry or work area in basement [employer] Shower/Tub: Walk-in shower (1 step into) Firefighter: Standard Home Adaptive  Equipment: Bedside commode/3-in-1;Walker - rolling Prior Function Level of  Independence: Independent Able to Take Stairs?: Yes Driving: Yes Communication Communication: HOH Dominant Hand: Right         Vision/Perception     Cognition  Overall Cognitive Status: Appears within functional limits for tasks assessed/performed Arousal/Alertness: Awake/alert Orientation Level: Appears intact for tasks assessed Behavior During Session: [facility] for tasks performed    Extremity/Trunk Assessment Right Upper Extremity Assessment RUE ROM/Strength/Tone: [facility] for tasks assessed Left Upper Extremity Assessment LUE ROM/Strength/Tone: [facility] for tasks assessed     Mobility Bed Mobility Bed Mobility: Supine to Sit;Sit to Supine Supine to Sit: 4: Min assist Sit to Supine: 4: Min assist Details for Bed Mobility Assistance: assist for L LE. VCs safety, technique, hand placement Transfers Sit to Stand: 4: Min assist;From bed;From elevated surface Stand to Sit: 4: Min assist;To bed;With upper extremity assist;To elevated surface Details for Transfer Assistance: VCs safety, technique, hand placement. Assist to rise, stabilize, control descent, maneuver with RW. Bed highly elevated due to decreased ROM R knee (previous knee surgery). Pt has to "walk" bil LEs forward in order to sit     Shoulder Instructions     Exercise     Balance     End of Session OT - End of Session Equipment Utilized During Treatment: Gait belt Activity Tolerance: Patient tolerated treatment well Patient left: in bed;with call bell/phone within reach CPM Left Knee CPM Left Knee: On  GO     Eisley Barber A OTR/L [phone] 11/27/2012, 3:36 PM

## 2012-11-27 NOTE — Progress Notes (Signed)
Following vital sign check patient reported new onset numbness in his right hand, specifically the thumb, index, and middle fingers.  Strong radial pulse.  Capillary refill WNL. Patient can lift right arm above his head and has a strong grip. No facial droop.  Denies numbness in any other extremity.  Denies shortness of breath.  Denies chest pain.  Paged Alphonsa Overall PA, received order for CT Head.  Patient's wife at bedside.  Will continue to monitor.

## 2012-11-27 NOTE — Progress Notes (Signed)
Physical Therapy Treatment Patient Details Name: Gregory Mccormick MRN: [id number] DOB: 11/28/1928 Today's Date: 11/27/2012 Time: 1340-1410 PT Time Calculation (min): 30 min  PT Assessment / Plan / Recommendation Comments on Treatment Session  Mobility improving. Pt tolerated ambulation well-walked ~130 feet with Min-guard assist. Denied dizziness. Recommend HHPT/24 hour supervision    Follow Up Recommendations  Home health PT;Supervision/Assistance - 24 hour     Does the patient have the potential to tolerate intense rehabilitation     Barriers to Discharge        Equipment Recommendations  None recommended by PT    Recommendations for Other Services OT consult  Frequency 7X/week   Plan Discharge plan remains appropriate    Precautions / Restrictions Precautions Precautions: Knee Required Braces or Orthoses: Knee Immobilizer - Left Knee Immobilizer - Left: Discontinue once straight leg raise with < 10 degree lag Restrictions Weight Bearing Restrictions: No LLE Weight Bearing: Weight bearing as tolerated   Pertinent Vitals/Pain 6/10 L knee with activity    Mobility  Bed Mobility Bed Mobility: Supine to Sit;Sit to Supine Supine to Sit: 4: Min assist Sit to Supine: 4: Min assist Details for Bed Mobility Assistance: assist for L LE. VCs safety, technique, hand placement Transfers Transfers: Sit to Stand;Stand to Sit Sit to Stand: 4: Min assist;From bed;From elevated surface Stand to Sit: 4: Min assist;To bed;With upper extremity assist;To elevated surface Details for Transfer Assistance: VCs safety, technique, hand placement. Assist to rise, stabilize, control descent, maneuver with RW. Bed highly elevated due to decreased ROM R knee (previous knee surgery). Pt has to "walk" bil LEs forward in order to sit Ambulation/Gait Ambulation/Gait Assistance: 4: Min guard Ambulation Distance (Feet): 130 Feet Assistive device: Rolling walker Ambulation/Gait Assistance Details: VCs  safety, technique, sequence, posture. Pt denied dizziness, but reports mild fatigue with distance.  Gait Pattern: Step-to pattern;Step-through pattern;Trunk flexed;Antalgic;Decreased stride length    Exercises     PT Diagnosis:    PT Problem List:   PT Treatment Interventions:     PT Goals Acute Rehab PT Goals Pt will go Supine/Side to Sit: with supervision PT Goal: Supine/Side to Sit - Progress: Progressing toward goal Pt will go Sit to Supine/Side: with supervision PT Goal: Sit to Supine/Side - Progress: Progressing toward goal Pt will go Sit to Stand: with supervision PT Goal: Sit to Stand - Progress: Progressing toward goal Pt will Ambulate: 51 - 150 feet;with supervision;with rolling walker PT Goal: Ambulate - Progress: Progressing toward goal Pt will Perform Home Exercise Program: with supervision, verbal cues required/provided PT Goal: Perform Home Exercise Program - Progress: Progressing toward goal  Visit Information  Last PT Received On: 11/27/12 Assistance Needed: +1    Subjective Data  Subjective: "I just got a little tired" Patient Stated Goal: home   Cognition  Overall Cognitive Status: Appears within functional limits for tasks assessed/performed Arousal/Alertness: Awake/alert Orientation Level: Appears intact for tasks assessed Behavior During Session: [facility] for tasks performed    Balance     End of Session PT - End of Session Equipment Utilized During Treatment: Left knee immobilizer;Gait belt Activity Tolerance: Patient tolerated treatment well Patient left: in bed;with call bell/phone within reach;with family/visitor present CPM Left Knee CPM Left Knee: On   GP     Rebeca Alert [facility] 11/27/2012, 3:24 PM [phone]

## 2012-11-27 NOTE — Progress Notes (Signed)
Physical Therapy Treatment Patient Details Name: Gregory Mccormick MRN: [id number] DOB: 09-Apr-1929 Today's Date: 11/27/2012 Time: 0900-0930 PT Time Calculation (min): 30 min  PT Assessment / Plan / Recommendation Comments on Treatment Session  POD1. Pt able to pivot to recliner. Continues to c/o dizziness. Pt also very drowsy this session. Deferred ambulation for safety reasons. Will attempt ambulation this pm. Recommend HHPT/24 hour supervision.     Follow Up Recommendations  Home health PT;Supervision/Assistance - 24 hour     Does the patient have the potential to tolerate intense rehabilitation     Barriers to Discharge        Equipment Recommendations  None recommended by PT    Recommendations for Other Services OT consult  Frequency 7X/week   Plan Discharge plan remains appropriate    Precautions / Restrictions Precautions Precautions: Knee Required Braces or Orthoses: Knee Immobilizer - Left Knee Immobilizer - Left: Discontinue once straight leg raise with < 10 degree lag Restrictions Weight Bearing Restrictions: No LLE Weight Bearing: Weight bearing as tolerated   Pertinent Vitals/Pain 5/10 L knee    Mobility  Bed Mobility Bed Mobility: Supine to Sit Supine to Sit: 4: Min assist;HOB elevated Details for Bed Mobility Assistance: Assist for L LE off bed. VCS safety, technique, hand placement Transfers Transfers: Sit to Stand;Stand to Sit;Stand Pivot Transfers Sit to Stand: 4: Min assist;From bed;From elevated surface;With upper extremity assist Stand to Sit: 4: Min assist;To chair/3-in-1;With armrests;With upper extremity assist Stand Pivot Transfers: 4: Min assist Details for Transfer Assistance: VCs safety, technique, hand placement. Assist to rise, stabilize, control descent, maneuver with RW. Bed highly elevated due to decreased ROM R knee (previous knee surgery). Pt has to "walk" bil LEs forward in order to sit.  Ambulation/Gait Ambulation/Gait Assistance: Not  tested (comment) Ambulation/Gait Assistance Details: Deferred due to pt's c/o dizziness. Pt also very drowsy-falling asleep during session.     Exercises Total Joint Exercises Ankle Circles/Pumps: AROM;Both;10 reps;Seated Quad Sets: AROM;Left;10 reps;Seated Short Arc Quad: AAROM;Left;10 reps;Seated Heel Slides: AAROM;Left;10 reps;Seated Hip ABduction/ADduction: AAROM;Left;10 reps;Seated Straight Leg Raises: AAROM;Left;10 reps;Seated   PT Diagnosis:    PT Problem List:   PT Treatment Interventions:     PT Goals Acute Rehab PT Goals Pt will go Supine/Side to Sit: with supervision PT Goal: Supine/Side to Sit - Progress: Progressing toward goal Pt will go Sit to Stand: with supervision PT Goal: Sit to Stand - Progress: Progressing toward goal Pt will Ambulate: 51 - 150 feet;with supervision;with rolling walker PT Goal: Ambulate - Progress: Progressing toward goal Pt will Perform Home Exercise Program: with supervision, verbal cues required/provided PT Goal: Perform Home Exercise Program - Progress: Progressing toward goal  Visit Information  Last PT Received On: 11/27/12 Assistance Needed:  (+ 1.5)    Subjective Data  Subjective: "I feel woozy-headed" Patient Stated Goal: home   Cognition  Overall Cognitive Status: Appears within functional limits for tasks assessed/performed Arousal/Alertness: Awake/alert Orientation Level: Appears intact for tasks assessed Behavior During Session: [facility] for tasks performed    Balance     End of Session PT - End of Session Equipment Utilized During Treatment: Gait belt;Left knee immobilizer Activity Tolerance: Other (comment) (Limited by dizziness, drowsiness) Patient left: in chair;with call bell/phone within reach;with family/visitor present   GP     Rebeca Alert [facility] 11/27/2012, 10:56 AM [phone]

## 2012-11-28 LAB — CBC
HCT: 32.6 % — ABNORMAL LOW (ref 39.0–52.0)
MCH: 30.9 pg (ref 26.0–34.0)
MCV: 91.6 fL (ref 78.0–100.0)
Platelets: 168 10*3/uL (ref 150–400)
RDW: 13.1 % (ref 11.5–15.5)
WBC: 16.7 10*3/uL — ABNORMAL HIGH (ref 4.0–10.5)

## 2012-11-28 LAB — BASIC METABOLIC PANEL
BUN: 10 mg/dL (ref 6–23)
CO2: 28 mEq/L (ref 19–32)
Calcium: 8.9 mg/dL (ref 8.4–10.5)
Chloride: 96 mEq/L (ref 96–112)
Creatinine, Ser: 0.9 mg/dL (ref 0.50–1.35)
GFR calc Af Amer: >90 mL/min (ref >90)

## 2012-11-28 NOTE — Discharge Summary (Signed)
Physician Discharge Summary   Patient ID: Gregory Mccormick MRN: [id number] DOB/AGE: 11-13-1928 79 y.o.  Admit date: 11/26/2012 Discharge date: 11/28/2012  Primary Diagnosis:  Osteoarthritis Left knee  Admission Diagnoses:  Past Medical History  Diagnosis Date  . Obesity   . HTN (hypertension)   . Vitamin D deficiency   . History of multiple pulmonary nodules   . Low testosterone   . ED (erectile dysfunction)   . Osteopenia   . OSA on CPAP     does not use cpap, does not like  . Arthritis   . PONV (postoperative nausea and vomiting)    Discharge Diagnoses:   Principal Problem:  *OA (osteoarthritis) of knee Active Problems:  Postop Acute blood loss anemia  Postop Hyponatremia  Estimated Body mass index is 30.56 kg/(m^2) as calculated from the following:   Height as of this encounter: 5\' 10" [taken 11/20/12[(1.778 m).   Weight as of this encounter: 213 lb(96.616 kg).  Classification of overweight in adults according to BMI (WHO, 1998)   Procedure:  Procedure(s) (LRB): TOTAL KNEE ARTHROPLASTY (Left)   Consults: None  HPI: VERSIE FLEENER is a 79 y.o. year old male with end stage OA of his left knee with progressively worsening pain and dysfunction. He has constant pain, with activity and at rest and significant functional deficits with difficulties even with ADLs. He has had extensive non-op management including analgesics, injections of cortisone and viscosupplements, and home exercise program, but remains in significant pain with significant dysfunction. Radiographs show bone on bone arthritis medial and patellofemoral. He presents now for left Total Knee Arthroplasty.   Laboratory Data: Admission on 11/26/2012  Component Date Value Range Status  . ABO/RH(D) 11/26/2012 O POS   Final  . Antibody Screen 11/26/2012 NEG   Final  . Sample Expiration 11/26/2012 11/29/2012   Final  . ABO/RH(D) 11/26/2012 O POS   Final  . WBC 11/27/2012 9.4  4.0 - 10.5 K/uL Final  . RBC  11/27/2012 3.53* 4.22 - 5.81 MIL/uL Final  . Hemoglobin 11/27/2012 10.9* 13.0 - 17.0 g/dL Final  . HCT 40/98/1191 32.9* 39.0 - 52.0 % Final  . MCV 11/27/2012 93.2  78.0 - 100.0 fL Final  . MCH 11/27/2012 30.9  26.0 - 34.0 pg Final  . MCHC 11/27/2012 33.1  30.0 - 36.0 g/dL Final  . RDW 47/82/9562 13.2  11.5 - 15.5 % Final  . Platelets 11/27/2012 165  150 - 400 K/uL Final  . Sodium 11/27/2012 130* 135 - 145 mEq/L Final  . Potassium 11/27/2012 3.9  3.5 - 5.1 mEq/L Final  . Chloride 11/27/2012 96  96 - 112 mEq/L Final  . CO2 11/27/2012 26  19 - 32 mEq/L Final  . Glucose, Bld 11/27/2012 131* 70 - 99 mg/dL Final  . BUN 13/05/6577 13  6 - 23 mg/dL Final  . Creatinine, Ser 11/27/2012 0.97  0.50 - 1.35 mg/dL Final  . Calcium 46/96/2952 8.1* 8.4 - 10.5 mg/dL Final  . GFR calc non Af Amer 11/27/2012 75* >90 mL/min Final  . GFR calc Af Amer 11/27/2012 87* >90 mL/min Final   Comment:                                 The eGFR has been calculated                          using the CKD EPI  equation.                          This calculation has not been                          validated in all clinical                          situations.                          eGFR's persistently                          <90 mL/min signify                          possible Chronic Kidney Disease.  . WBC 11/28/2012 16.7* 4.0 - 10.5 K/uL Final  . RBC 11/28/2012 3.56* 4.22 - 5.81 MIL/uL Final  . Hemoglobin 11/28/2012 11.0* 13.0 - 17.0 g/dL Final  . HCT 16/08/9603 32.6* 39.0 - 52.0 % Final  . MCV 11/28/2012 91.6  78.0 - 100.0 fL Final  . MCH 11/28/2012 30.9  26.0 - 34.0 pg Final  . MCHC 11/28/2012 33.7  30.0 - 36.0 g/dL Final  . RDW 54/07/8118 13.1  11.5 - 15.5 % Final  . Platelets 11/28/2012 168  150 - 400 K/uL Final  . Sodium 11/28/2012 134* 135 - 145 mEq/L Final  . Potassium 11/28/2012 4.3  3.5 - 5.1 mEq/L Final  . Chloride 11/28/2012 96  96 - 112 mEq/L Final  . CO2 11/28/2012 28  19 - 32 mEq/L Final  .  Glucose, Bld 11/28/2012 132* 70 - 99 mg/dL Final  . BUN 14/78/2956 10  6 - 23 mg/dL Final  . Creatinine, Ser 11/28/2012 0.90  0.50 - 1.35 mg/dL Final  . Calcium 21/30/8657 8.9  8.4 - 10.5 mg/dL Final  . GFR calc non Af Amer 11/28/2012 78* >90 mL/min Final  . GFR calc Af Amer 11/28/2012 >90  >90 mL/min Final   Comment:                                 The eGFR has been calculated                          using the CKD EPI equation.                          This calculation has not been                          validated in all clinical                          situations.                          eGFR's persistently                          <90 mL/min signify  possible Chronic Kidney Disease.  Hospital Outpatient Visit on 11/20/2012  Component Date Value Range Status  . aPTT 11/20/2012 33  24 - 37 seconds Final  . WBC 11/20/2012 8.0  4.0 - 10.5 K/uL Final  . RBC 11/20/2012 4.63  4.22 - 5.81 MIL/uL Final  . Hemoglobin 11/20/2012 14.5  13.0 - 17.0 g/dL Final  . HCT 13/05/6577 43.5  39.0 - 52.0 % Final  . MCV 11/20/2012 94.0  78.0 - 100.0 fL Final  . MCH 11/20/2012 31.3  26.0 - 34.0 pg Final  . MCHC 11/20/2012 33.3  30.0 - 36.0 g/dL Final  . RDW 46/96/2952 13.0  11.5 - 15.5 % Final  . Platelets 11/20/2012 214  150 - 400 K/uL Final  . Sodium 11/20/2012 139  135 - 145 mEq/L Final  . Potassium 11/20/2012 4.3  3.5 - 5.1 mEq/L Final  . Chloride 11/20/2012 100  96 - 112 mEq/L Final  . CO2 11/20/2012 30  19 - 32 mEq/L Final  . Glucose, Bld 11/20/2012 128* 70 - 99 mg/dL Final  . BUN 84/13/2440 16  6 - 23 mg/dL Final  . Creatinine, Ser 11/20/2012 1.20  0.50 - 1.35 mg/dL Final  . Calcium 08/27/2535 9.5  8.4 - 10.5 mg/dL Final  . Total Protein 11/20/2012 7.0  6.0 - 8.3 g/dL Final  . Albumin 64/40/3474 3.8  3.5 - 5.2 g/dL Final  . AST 25/95/6387 18  0 - 37 U/L Final  . ALT 11/20/2012 12  0 - 53 U/L Final  . Alkaline Phosphatase 11/20/2012 66  39 - 117 U/L Final  . Total  Bilirubin 11/20/2012 0.5  0.3 - 1.2 mg/dL Final  . GFR calc non Af Amer 11/20/2012 55* >90 mL/min Final  . GFR calc Af Amer 11/20/2012 64* >90 mL/min Final   Comment:                                 The eGFR has been calculated                          using the CKD EPI equation.                          This calculation has not been                          validated in all clinical                          situations.                          eGFR's persistently                          <90 mL/min signify                          possible Chronic Kidney Disease.  Marland Kitchen Prothrombin Time 11/20/2012 13.8  11.6 - 15.2 seconds Final  . INR 11/20/2012 1.07  0.00 - 1.49 Final  . Color, Urine 11/20/2012 YELLOW  YELLOW Final  . APPearance 11/20/2012 CLEAR  CLEAR Final  . Specific Gravity, Urine 11/20/2012 1.018  1.005 - 1.030 Final  .  pH 11/20/2012 6.0  5.0 - 8.0 Final  . Glucose, UA 11/20/2012 NEGATIVE  NEGATIVE mg/dL Final  . Hgb urine dipstick 11/20/2012 NEGATIVE  NEGATIVE Final  . Bilirubin Urine 11/20/2012 NEGATIVE  NEGATIVE Final  . Ketones, ur 11/20/2012 NEGATIVE  NEGATIVE mg/dL Final  . Protein, ur 16/08/9603 NEGATIVE  NEGATIVE mg/dL Final  . Urobilinogen, UA 11/20/2012 0.2  0.0 - 1.0 mg/dL Final  . Nitrite 54/07/8118 NEGATIVE  NEGATIVE Final  . Leukocytes, UA 11/20/2012 NEGATIVE  NEGATIVE Final   MICROSCOPIC NOT DONE ON URINES WITH NEGATIVE PROTEIN, BLOOD, LEUKOCYTES, NITRITE, OR GLUCOSE <1000 mg/dL.  Marland Kitchen MRSA, PCR 11/20/2012 NEGATIVE  NEGATIVE Final  . Staphylococcus aureus 11/20/2012 NEGATIVE  NEGATIVE Final   Comment:                                 The Xpert SA Assay (FDA                          approved for NASAL specimens                          in patients over 35 years of age),                          is one component of                          a comprehensive surveillance                          program.  Test performance has                          been validated by  [employer] for patients greater                          than or equal to 58 year old.                          It is not intended                          to diagnose infection nor to                          guide or monitor treatment.     X-Rays:Dg Chest 2 View  11/20/2012  *RADIOLOGY REPORT*  Clinical Data: Preop left knee arthroplasty  CHEST - 2 VIEW  Comparison: 10/06/2010  Findings: Large hiatal hernia.  Dual lead pacemaker is unchanged. Negative for heart failure.  Negative for infiltrate or effusion. Chronic injury right AC joint with calcification of the coracoclavicular ligaments.  IMPRESSION: Large hiatal hernia.  No acute cardiopulmonary abnormality   Original Report Authenticated By: Janeece Riggers, M.D.    Ct Head Wo Contrast  11/27/2012  *RADIOLOGY REPORT*  Clinical Data: Sudden onset right hand numbness  CT HEAD WITHOUT CONTRAST  Technique:  Contiguous axial images were obtained  from the base of the skull through the vertex without contrast.  Comparison: None.  Findings: Periventricular and subcortical white matter hypodensities are most in keeping with chronic microangiopathic change. There is no evidence for acute hemorrhage, hydrocephalus, mass lesion, or abnormal extra-axial fluid collection.  No definite CT evidence for acute infarction.  The visualized paranasal sinuses are predominately clear.  Postoperative changes of the right mastoid air cells.  Left mastoid air cells are clear.  IMPRESSION: White matter changes are nonspecific however most in keeping with chronic microangiopathic change.  No definite CT evidence of an acute intracranial abnormality.  Given clinical symptoms, note that MRI has increased sensitivity for acute ischemia   Original Report Authenticated By: Jearld Lesch, M.D.     EKG: Orders placed in visit on 09/27/10  . CONVERTED CEMR EKG     Hospital Course: BARKLEY KRATOCHVIL is a 79 y.o. who was admitted to [facility].  They were brought to the operating room on 11/26/2012 - 11/27/2012 and underwent Procedure(s): TOTAL KNEE ARTHROPLASTY.  Patient tolerated the procedure well and was later transferred to the recovery room and then to the orthopaedic floor for postoperative care.  They were given PO and IV analgesics for pain control following their surgery.  They were given 24 hours of postoperative antibiotics of  Anti-infectives     Start     Dose/Rate Route Frequency Ordered Stop   11/26/12 2130   vancomycin (VANCOCIN) IVPB 1000 mg/200 mL premix        1,000 mg 200 mL/hr over 60 Minutes Intravenous Every 12 hours 11/26/12 1206 11/26/12 2309   11/26/12 0738   vancomycin (VANCOCIN) 1,500 mg in sodium chloride 0.9 % 500 mL IVPB        1,500 mg 250 mL/hr over 120 Minutes Intravenous 120 min pre-op 11/26/12 0738 11/26/12 0920         and started on DVT prophylaxis in the form of Xarelto.   PT and OT were ordered for total joint protocol.  Discharge planning consulted to help with postop disposition and equipment needs.  Patient had a tough night on the evening of surgery with some knee pain but started to get up OOB with therapy on day one and walked about 130 feet. Hemovac drain was pulled without difficulty.  Continued to work with therapy into day two walking about 175 feet.  Dressing was changed on day two and the incision was healing well.  Patient was seen in rounds and was ready to go home later on POD 2 after therapy.   Discharge Medications: Prior to Admission medications   Medication Sig Start Date End Date Taking? Authorizing Provider  acetaminophen (TYLENOL) 500 MG tablet Take 1,000 mg by mouth every 6 (six) hours as needed. For leg pain.   Yes Historical Provider, MD  hydrochlorothiazide (HYDRODIURIL) 12.5 MG tablet Take 12.5 mg by mouth daily. Every other day   Yes Historical Provider, MD  methocarbamol (ROBAXIN) 500 MG tablet Take 1 tablet (500 mg total) by mouth every 6 (six) hours as needed.  11/27/12   Shanitra Phillippi, PA  oxyCODONE (OXY IR/ROXICODONE) 5 MG immediate release tablet Take 1-2 tablets (5-10 mg total) by mouth every 3 (three) hours as needed. 11/27/12   Dauna Ziska Julien Girt, PA  rivaroxaban (XARELTO) 10 MG TABS tablet Take 1 tablet (10 mg total) by mouth daily with breakfast. Take Xarelto for two and a half more weeks, then discontinue Xarelto. Once the patient has completed the Xarelto, they may  resume the 81 mg Aspirin. 11/27/12   Denene Alamillo Julien Girt, PA    Diet: Cardiac diet Activity:WBAT Follow-up:in 2 weeks Disposition - Home Discharged Condition: good   Discharge Orders    Future Orders Please Complete By Expires   Diet - low sodium heart healthy      Call MD / Call 911      Comments:   If you experience chest pain or shortness of breath, CALL 911 and be transported to the hospital emergency room.  If you develope a fever above 101 F, pus (white drainage) or increased drainage or redness at the wound, or calf pain, call your surgeon's office.   Discharge instructions      Comments:   Pick up stool softner and laxative for home. Do not submerge incision under water. May shower. Continue to use ice for pain and swelling from surgery.  Take Xarelto for two and a half more weeks, then discontinue Xarelto. Once the patient has completed the Xarelto, they may resume the 81 mg Aspirin.   Constipation Prevention      Comments:   Drink plenty of fluids.  Prune juice may be helpful.  You may use a stool softener, such as Colace (over the counter) 100 mg twice a day.  Use MiraLax (over the counter) for constipation as needed.   Increase activity slowly as tolerated      Patient may shower      Comments:   You may shower without a dressing once there is no drainage.  Do not wash over the wound.  If drainage remains, do not shower until drainage stops.   Weight bearing as tolerated      Driving restrictions      Comments:   No driving until released by the  physician.   Lifting restrictions      Comments:   No lifting until released by the physician.   TED hose      Comments:   Use stockings (TED hose) for 3 weeks on both leg(s).  You may remove them at night for sleeping.   Change dressing      Comments:   Change dressing daily with sterile 4 x 4 inch gauze dressing and apply TED hose. Do not submerge the incision under water.   Do not put a pillow under the knee. Place it under the heel.      Do not sit on low chairs, stoools or toilet seats, as it may be difficult to get up from low surfaces          Medication List     As of 11/28/2012  7:09 AM    STOP taking these medications         aspirin EC 81 MG tablet      fish oil-omega-3 fatty acids 1000 MG capsule      testosterone 50 MG/5GM Gel   Commonly known as: ANDROGEL      Vitamin D 2000 UNITS Caps      TAKE these medications         acetaminophen 500 MG tablet   Commonly known as: TYLENOL   Take 1,000 mg by mouth every 6 (six) hours as needed. For leg pain.      hydrochlorothiazide 12.5 MG tablet   Commonly known as: HYDRODIURIL   Take 12.5 mg by mouth daily. Every other day      methocarbamol 500 MG tablet   Commonly known as: ROBAXIN   Take 1 tablet (500 mg  total) by mouth every 6 (six) hours as needed.      oxyCODONE 5 MG immediate release tablet   Commonly known as: Oxy IR/ROXICODONE   Take 1-2 tablets (5-10 mg total) by mouth every 3 (three) hours as needed.      rivaroxaban 10 MG Tabs tablet   Commonly known as: XARELTO   Take 1 tablet (10 mg total) by mouth daily with breakfast. Take Xarelto for two and a half more weeks, then discontinue Xarelto.  Once the patient has completed the Xarelto, they may resume the 81 mg Aspirin.           Follow-up Information    Follow up with Loanne Drilling, MD. Schedule an appointment as soon as possible for a visit in 2 weeks.   Contact information:   [address]  [address] [place] [address] [phone]          Signed: Patrica Duel 11/28/2012, 7:09 AM

## 2012-11-28 NOTE — Progress Notes (Signed)
Physical Therapy Treatment Patient Details Name: Gregory Mccormick MRN: [id number] DOB: 1929-06-09 Today's Date: 11/28/2012 Time: 4098-1191 PT Time Calculation (min): 10 min  PT Assessment / Plan / Recommendation Comments on Treatment Session  2nd session. Practiced 1 step. completed all education. Issued ROM/exercise handout and instructed pt to perform exercises one more time today. Recommend HHPT. Ready for d/c.     Follow Up Recommendations  Home health PT;Supervision/Assistance - 24 hour     Does the patient have the potential to tolerate intense rehabilitation     Barriers to Discharge        Equipment Recommendations  None recommended by PT    Recommendations for Other Services    Frequency 7X/week   Plan Discharge plan remains appropriate    Precautions / Restrictions Precautions Precautions: Knee Required Braces or Orthoses: Knee Immobilizer - Left Knee Immobilizer - Left: Discontinue once straight leg raise with < 10 degree lag Restrictions Weight Bearing Restrictions: No LLE Weight Bearing: Weight bearing as tolerated   Pertinent Vitals/Pain 4/10 L knee    Mobility  Bed Mobility Bed Mobility: Supine to Sit;Sit to Supine Supine to Sit: 4: Min guard Sit to Supine: 4: Min guard Details for Bed Mobility Assistance: Pt able to use R LE to hook L LE and move onto/off of bed Transfers Transfers: Sit to Stand;Stand to Sit Sit to Stand: 4: Min guard;From bed;From elevated surface Stand to Sit: 4: Min guard;To elevated surface;To bed Details for Transfer Assistance: VCS safety, technique, hand placement. Pt still has difficulty rising, lowering to low surfaces due to decreased ROM R knee Ambulation/Gait Ambulation/Gait Assistance: 4: Min guard Ambulation Distance (Feet): 60 Feet Assistive device: Rolling walker Gait Pattern: Step-through pattern;Decreased stride length Stairs: Yes Stairs Assistance: 4: Min guard Stairs Assistance Details (indicate cue type and  reason): VCs safety, technique, sequence. Pt able to recall and demonstrate proper technique "up with good, down with bad" Stair Management Technique: Forwards;With walker;Step to pattern Number of Stairs: 1     Exercises     PT Diagnosis:    PT Problem List:   PT Treatment Interventions:     PT Goals Acute Rehab PT Goals Pt will go Supine/Side to Sit: with supervision PT Goal: Supine/Side to Sit - Progress: Progressing toward goal Pt will go Sit to Supine/Side: with supervision PT Goal: Sit to Supine/Side - Progress: Progressing toward goal Pt will go Sit to Stand: with supervision PT Goal: Sit to Stand - Progress: Progressing toward goal Pt will Ambulate: 51 - 150 feet;with supervision;with rolling walker PT Goal: Ambulate - Progress: Progressing toward goal Pt will Go Up / Down Stairs: 1-2 stairs;with supervision;with rolling walker PT Goal: Up/Down Stairs - Progress: Progressing toward goal Pt will Perform Home Exercise Program: with supervision, verbal cues required/provided PT Goal: Perform Home Exercise Program - Progress: Progressing toward goal  Visit Information  Last PT Received On: 11/28/12 Assistance Needed: +1    Subjective Data  Subjective: "Im ready" Patient Stated Goal: home today   Cognition  Overall Cognitive Status: Appears within functional limits for tasks assessed/performed Arousal/Alertness: Awake/alert Orientation Level: Appears intact for tasks assessed Behavior During Session: [facility] for tasks performed    Balance     End of Session PT - End of Session Equipment Utilized During Treatment: Left knee immobilizer Activity Tolerance: Patient tolerated treatment well Patient left: in bed;with call bell/phone within reach;with family/visitor present   GP     Rebeca Alert [facility] 11/28/2012, 3:10 PM [phone]

## 2012-11-28 NOTE — Progress Notes (Signed)
   Subjective: 2 Days Post-Op Procedure(s) (LRB): TOTAL KNEE ARTHROPLASTY (Left) Patient reports pain as mild.   Patient seen in rounds with Dr. Lequita Halt. Patient is well, and has had no acute complaints or problems Patient is ready to go home after two sessions of therapy.  Objective: Vital signs in last 24 hours: Temp:  [94.7 F (34.8 C)-98.3 F (36.8 C)] 98.3 F (36.8 C) (01/29 1610) Pulse Rate:  [69-80] 80  (01/29 0613) Resp:  [13-20] 20  (01/29 0613) BP: (129-156)/(76-90) 151/76 mmHg (01/29 0613) SpO2:  [96 %-98 %] 97 % (01/29 9604)  Intake/Output from previous day:  Intake/Output Summary (Last 24 hours) at 11/28/12 0703 Last data filed at 11/28/12 0615  Gross per 24 hour  Intake   2612 ml  Output   3200 ml  Net   -588 ml    Intake/Output this shift:    Labs:  Basename 11/28/12 0421 11/27/12 0417  HGB 11.0* 10.9*    Basename 11/28/12 0421 11/27/12 0417  WBC 16.7* 9.4  RBC 3.56* 3.53*  HCT 32.6* 32.9*  PLT 168 165    Basename 11/28/12 0421 11/27/12 0417  NA 134* 130*  K 4.3 3.9  CL 96 96  CO2 28 26  BUN 10 13  CREATININE 0.90 0.97  GLUCOSE 132* 131*  CALCIUM 8.9 8.1*   No results found for this basename: LABPT:2,INR:2 in the last 72 hours  EXAM: General - Patient is Alert, Appropriate and Oriented Extremity - Neurovascular intact Sensation intact distally Dorsiflexion/Plantar flexion intact No cellulitis present Incision - clean, dry, no drainage, healing Motor Function - intact, moving foot and toes well on exam.   Assessment/Plan: 2 Days Post-Op Procedure(s) (LRB): TOTAL KNEE ARTHROPLASTY (Left) Procedure(s) (LRB): TOTAL KNEE ARTHROPLASTY (Left) Past Medical History  Diagnosis Date  . Obesity   . HTN (hypertension)   . Vitamin D deficiency   . History of multiple pulmonary nodules   . Low testosterone   . ED (erectile dysfunction)   . Osteopenia   . OSA on CPAP     does not use cpap, does not like  . Arthritis   . PONV  (postoperative nausea and vomiting)    Principal Problem:  *OA (osteoarthritis) of knee Active Problems:  Postop Acute blood loss anemia  Postop Hyponatremia  Estimated Body mass index is 30.56 kg/(m^2) as calculated from the following:   Height as of this encounter: 5\' 10" [taken 11/20/12[(1.778 m).   Weight as of this encounter: 213 lb(96.616 kg). Discharge home with home health Diet - Cardiac diet Follow up - in 2 weeks Activity - WBAT Disposition - Home Condition Upon Discharge - Good D/C Meds - See DC Summary DVT Prophylaxis - Xarelto, ASA 81 mg on hold   Arianie Couse 11/28/2012, 7:03 AM

## 2012-11-28 NOTE — Care Management Note (Signed)
    Page 1 of 2   11/28/2012     3:13:51 PM   CARE MANAGEMENT NOTE 11/28/2012  Patient:  Gregory Mccormick, Gregory Mccormick   Account Number:  [id number]  Date Initiated:  11/28/2012  Documentation initiated by:  Colleen Can  Subjective/Objective Assessment:   dx left knee osteoarthritis; total knee replacemnt     Action/Plan:   CM spoke with patient and spouse. Plans are for patient to return to his home in [place] where spouse will be caregiver. He already has DME. Plans to use [facility] for [facility] services .   Anticipated DC Date:  11/28/2012   Anticipated DC Plan:  HOME W HOME HEALTH SERVICES  In-house referral  Clinical Social Worker      DC Planning Services  CM consult      [facility] Choice  HOME HEALTH   Choice offered to / List presented to:  C-1 Patient   DME arranged  NA      DME agency  NA     HH arranged  HH-2 PT      [facility] agency  [employer]   Status of service:  Completed, signed off Medicare Important Message given?  NA - LOS <3 / Initial given by admissions (If response is "NO", the following Medicare IM given date fields will be blank) Date Medicare IM given:   Date Additional Medicare IM given:    Discharge Disposition:  HOME W HOME HEALTH SERVICES  Per UR Regulation:    If discussed at Long Length of Stay Meetings, dates discussed:    Comments:  11/28/2012 Colleen Can BSN RN CCM [phone] [facility] will provide HHPT services with start date of day after discharge.

## 2012-11-28 NOTE — Progress Notes (Signed)
Physical Therapy Treatment Patient Details Name: Gregory Mccormick MRN: [id number] DOB: 10-12-1929 Today's Date: 11/28/2012 Time: 9147-8295 PT Time Calculation (min): 35 min  PT Assessment / Plan / Recommendation Comments on Treatment Session  continuing to improve. will plan to see for a 2nd session prior to d/c on today. Recommend HHPT.     Follow Up Recommendations  Home health PT;Supervision/Assistance - 24 hour     Does the patient have the potential to tolerate intense rehabilitation     Barriers to Discharge        Equipment Recommendations  None recommended by PT    Recommendations for Other Services    Frequency 7X/week   Plan Discharge plan remains appropriate    Precautions / Restrictions Precautions Precautions: Knee Required Braces or Orthoses: Knee Immobilizer - Left Knee Immobilizer - Left: Discontinue once straight leg raise with < 10 degree lag Restrictions Weight Bearing Restrictions: No LLE Weight Bearing: Weight bearing as tolerated   Pertinent Vitals/Pain 8.10 L knee    Mobility  Bed Mobility Bed Mobility: Supine to Sit;Sit to Supine Supine to Sit: 4: Min guard Sit to Supine: 4: Min assist Details for Bed Mobility Assistance: Assist for L LE onto bed.  Transfers Transfers: Sit to Stand;Stand to Sit Sit to Stand: 4: Min guard;From bed Stand to Sit: 4: Min guard;To bed Details for Transfer Assistance: VCS safety, technique, hand placement. Pt still has difficulty rising, lowering to low surfaces due to decreased ROM R knee.  Ambulation/Gait Ambulation/Gait Assistance: 4: Min guard Ambulation Distance (Feet): 175 Feet Assistive device: Rolling walker Ambulation/Gait Assistance Details: VCS posture. Good gait speed. No LOB Gait Pattern: Step-through pattern;Decreased stride length    Exercises Total Joint Exercises Ankle Circles/Pumps: AROM;Both;10 reps;Supine Quad Sets: AROM;Left;10 reps;Supine Short Arc Quad: AROM;AAROM;Left;10 reps;Supine Heel  Slides: AAROM;Left;10 reps;Supine Hip ABduction/ADduction: Left;10 reps;Supine;AROM Straight Leg Raises: AAROM;Left;10 reps;Supine   PT Diagnosis:    PT Problem List:   PT Treatment Interventions:     PT Goals Acute Rehab PT Goals Pt will go Supine/Side to Sit: with supervision PT Goal: Supine/Side to Sit - Progress: Progressing toward goal Pt will go Sit to Supine/Side: with supervision PT Goal: Sit to Supine/Side - Progress: Progressing toward goal Pt will go Sit to Stand: with supervision PT Goal: Sit to Stand - Progress: Progressing toward goal PT Goal: Ambulate - Progress: Progressing toward goal Pt will Perform Home Exercise Program: with supervision, verbal cues required/provided PT Goal: Perform Home Exercise Program - Progress: Progressing toward goal  Visit Information  Last PT Received On: 11/28/12 Assistance Needed: +1    Subjective Data  Subjective: "I walked to the bathroom  by myself" Patient Stated Goal: home   Cognition  Overall Cognitive Status: Appears within functional limits for tasks assessed/performed Arousal/Alertness: Awake/alert Orientation Level: Appears intact for tasks assessed Behavior During Session: [facility] for tasks performed    Balance     End of Session PT - End of Session Equipment Utilized During Treatment: Left knee immobilizer;Gait belt Activity Tolerance: Patient tolerated treatment well Patient left: with call bell/phone within reach;in bed;with family/visitor present   GP     Rebeca Alert [facility] 11/28/2012, 9:43 AM [phone]

## 2012-11-28 NOTE — OR Nursing (Signed)
Surgery date corrected. Gwenlyn Found RN

## 2012-11-28 NOTE — Progress Notes (Signed)
Occupational Therapy Treatment Patient Details Name: Gregory Mccormick MRN: [id number] DOB: [birth year] Today's Date: 11/28/2012 Time: 1137-1200 OT Time Calculation (min): 23 min  OT Assessment / Plan / Recommendation Comments on Treatment Session Pt doing well. Minimal c/o pain.    Follow Up Recommendations  Home health OT    Barriers to Discharge       Equipment Recommendations  None recommended by OT    Recommendations for Other Services    Frequency Min 2X/week   Plan Discharge plan remains appropriate    Precautions / Restrictions Precautions Precautions: Knee Required Braces or Orthoses: Knee Immobilizer - Left Knee Immobilizer - Left: Discontinue once straight leg raise with < 10 degree lag Restrictions Weight Bearing Restrictions: No LLE Weight Bearing: Weight bearing as tolerated   Pertinent Vitals/Pain Pt reported minimal L knee pain that he did not rate. Repositioned and cold applied.    ADL  Lower Body Dressing: Minimal assistance Where Assessed - Lower Body Dressing: Supported sit to stand Toilet Transfer: Hydrographic surveyor Method: Sit to Barista: Other (comment) Nurse, children's) Tub/Shower Transfer: Insurance risk surveyor Method: Science writer: Walk in Scientist, research (physical sciences) Used: Rolling walker Transfers/Ambulation Related to ADLs: Pt ambulated to the bathroom with minguard A and RW. ADL Comments: Educated pt how to safely step into and out of the shower with good return demo. Pt also educated on proper technique for LB dressing with good return demo. Pt only required steadying assist to pull shorts up around hips.    OT Diagnosis:    OT Problem List:   OT Treatment Interventions:     OT Goals ADL Goals ADL Goal: Lower Body Dressing - Progress: Progressing toward goals ADL Goal: Toilet Transfer - Progress: Progressing toward goals ADL Goal: Tub/Shower Transfer - Progress: Progressing toward  goals  Visit Information  Last OT Received On: 11/28/12 Assistance Needed: +1    Subjective Data  Subjective: I'm ready to go!   Prior Functioning       Cognition  Overall Cognitive Status: Appears within functional limits for tasks assessed/performed Arousal/Alertness: Awake/alert Orientation Level: Appears intact for tasks assessed Behavior During Session: [facility] for tasks performed    Mobility  Shoulder Instructions Bed Mobility Bed Mobility: Supine to Sit;Sit to Supine Supine to Sit: 4: Min guard Sit to Supine: 4: Min assist Details for Bed Mobility Assistance: Assist for L LE onto bed.  Transfers Sit to Stand: 4: Min guard;From bed Stand to Sit: 4: Min guard;To bed Details for Transfer Assistance: VCS safety, technique, hand placement. Pt still has difficulty rising, lowering to low surfaces due to decreased ROM R knee.        Exercises     Balance     End of Session OT - End of Session Activity Tolerance: Patient tolerated treatment well Patient left: in chair;with call bell/phone within reach  GO     Gregory Mccormick A OTR/L [phone] 11/28/2012, 12:13 PM

## 2013-03-20 ENCOUNTER — Other Ambulatory Visit: Payer: Self-pay | Admitting: Interventional Cardiology

## 2013-03-21 HISTORY — PX: CARDIAC CATHETERIZATION: SHX172

## 2013-03-22 ENCOUNTER — Inpatient Hospital Stay (HOSPITAL_BASED_OUTPATIENT_CLINIC_OR_DEPARTMENT_OTHER)
Admission: RE | Admit: 2013-03-22 | Discharge: 2013-03-22 | Disposition: A | Discharge status: Home / Self Care | Payer: Medicare Other | Source: Ambulatory Visit | Attending: Interventional Cardiology | Admitting: Interventional Cardiology

## 2013-03-22 ENCOUNTER — Encounter (HOSPITAL_BASED_OUTPATIENT_CLINIC_OR_DEPARTMENT_OTHER): Payer: Self-pay | Admitting: Interventional Cardiology

## 2013-03-22 ENCOUNTER — Encounter (HOSPITAL_BASED_OUTPATIENT_CLINIC_OR_DEPARTMENT_OTHER)
Admission: RE | Disposition: A | Discharge status: Home / Self Care | Payer: Self-pay | Source: Ambulatory Visit | Attending: Interventional Cardiology

## 2013-03-22 DIAGNOSIS — I472 Ventricular tachycardia, unspecified: Secondary | ICD-10-CM | POA: Insufficient documentation

## 2013-03-22 DIAGNOSIS — I10 Essential (primary) hypertension: Secondary | ICD-10-CM

## 2013-03-22 DIAGNOSIS — R9430 Abnormal result of cardiovascular function study, unspecified: Secondary | ICD-10-CM | POA: Insufficient documentation

## 2013-03-22 DIAGNOSIS — I519 Heart disease, unspecified: Secondary | ICD-10-CM | POA: Insufficient documentation

## 2013-03-22 DIAGNOSIS — I428 Other cardiomyopathies: Secondary | ICD-10-CM | POA: Insufficient documentation

## 2013-03-22 DIAGNOSIS — I4729 Other ventricular tachycardia: Secondary | ICD-10-CM | POA: Insufficient documentation

## 2013-03-22 DIAGNOSIS — I2510 Atherosclerotic heart disease of native coronary artery without angina pectoris: Secondary | ICD-10-CM | POA: Insufficient documentation

## 2013-03-22 HISTORY — DX: Other ventricular tachycardia: I47.29

## 2013-03-22 HISTORY — DX: Ventricular tachycardia: I47.2

## 2013-03-22 HISTORY — DX: Abnormal result of cardiovascular function study, unspecified: R94.30

## 2013-03-22 HISTORY — DX: Ventricular tachycardia, unspecified: I47.20

## 2013-03-22 SURGERY — JV LEFT HEART CATHETERIZATION WITH CORONARY ANGIOGRAM
Anesthesia: Moderate Sedation

## 2013-03-22 MED ORDER — DIAZEPAM 5 MG PO TABS
5.0000 mg | ORAL_TABLET | ORAL | Status: AC
  Administered 2013-03-22: 5 mg via ORAL

## 2013-03-22 MED ORDER — ASPIRIN 81 MG PO CHEW
324.0000 mg | CHEWABLE_TABLET | ORAL | Status: AC
  Administered 2013-03-22: 324 mg via ORAL

## 2013-03-22 MED ORDER — ASPIRIN 81 MG PO CHEW: 81.0000 mg | CHEWABLE_TABLET | Freq: Every day | ORAL | Status: DC

## 2013-03-22 MED ORDER — CLOPIDOGREL BISULFATE 75 MG PO TABS: 75.0000 mg | ORAL_TABLET | Freq: Every day | ORAL | Status: DC

## 2013-03-22 MED ORDER — SODIUM CHLORIDE 0.9 % IJ SOLN: 3.0000 mL | Freq: Two times a day (BID) | INTRAMUSCULAR | Status: DC

## 2013-03-22 MED ORDER — SODIUM CHLORIDE 0.9 % IJ SOLN: 3.0000 mL | INTRAMUSCULAR | Status: DC | PRN

## 2013-03-22 MED ORDER — SODIUM CHLORIDE 0.9 % IV SOLN: 1.0000 mL/kg/h | INTRAVENOUS | Status: AC

## 2013-03-22 MED ORDER — ONDANSETRON HCL 4 MG/2ML IJ SOLN: 4.0000 mg | Freq: Four times a day (QID) | INTRAMUSCULAR | Status: DC | PRN

## 2013-03-22 MED ORDER — ACETAMINOPHEN 325 MG PO TABS: 650.0000 mg | ORAL_TABLET | ORAL | Status: DC | PRN

## 2013-03-22 MED ORDER — SODIUM CHLORIDE 0.9 % IV SOLN: INTRAVENOUS | Status: DC

## 2013-03-22 MED ORDER — SODIUM CHLORIDE 0.9 % IV SOLN: 250.0000 mL | INTRAVENOUS | Status: DC | PRN

## 2013-03-22 NOTE — OR Nursing (Signed)
Dr Varanasi at bedside to discuss results and treatment plan with pt and family 

## 2013-03-22 NOTE — CV Procedure (Signed)
PROCEDURE:  Left heart catheterization with selective coronary angiography, left ventriculogram. Abdominal aortogram  INDICATIONS:  Abnormal LV function; NSVT  The risks, benefits, and details of the procedure were explained to the patient.  The patient verbalized understanding and wanted to proceed.  Informed written consent was obtained.  PROCEDURE TECHNIQUE:  After Xylocaine anesthesia a 78F sheath was placed in the right femoral artery with a single anterior needle wall stick.   Left coronary angiography was done using a Judkins L4 guide catheter.  Right coronary angiography was done using a Judkins R4 guide catheter.  Left ventriculography was done using a pigtail catheter.    CONTRAST:  Total of 95 cc.  COMPLICATIONS:  None.    HEMODYNAMICS:  Aortic pressure was 155/63; LV pressure was 155/21; LVEDP 26.  There was no gradient between the left ventricle and aorta.    ANGIOGRAPHIC DATA:   The left main coronary artery is widely patent.  The left anterior descending artery is a large vessel. There is moderate-severe proximal disease.  There is a severe focal stenosis in the mid portion.    The left circumflex artery is a large vessel.  There is a moderate proximal vessel stenosis.  There is a large OM1 with mild atherosclerosis.  The right coronary artery is a large vessel with mild diffuse atherosclerosis.  LEFT VENTRICULOGRAM:  Left ventricular angiogram was done in the 30 RAO projection and revealed severely decreased systolic function with an estimated ejection fraction of 25-30%.  LVEDP was 26 mmHg.  Abdominal Aortogram: No AAA.  Bilateral single renal arteries are widely patent.  Patent aortoiliac bifurcation.  IMPRESSIONS:  1. Normal left main coronary artery. 2. Severe disease in the proximal to mid left anterior descending artery. 3. Moderate to severe proximal disease in the left circumflex artery. 4. Mild disease in the right coronary artery. 5. Severely decreased left  ventricular systolic function.  LVEDP 26 mmHg.  Ejection fraction 30%.  RECOMMENDATION:  Medical therapy for LV dysfunction which appears out of proportion for degree of CAD.  Consider angioplasty.  Would have to consider rotational atherectomy.

## 2013-03-22 NOTE — OR Nursing (Signed)
+  Allen's test right hand 

## 2013-03-22 NOTE — H&P (Signed)
Date of Initial H&P: 03/19/13  History reviewed, patient examined, no change in status, stable for surgery.

## 2013-03-22 NOTE — OR Nursing (Signed)
Tegaderm dressing applied, site level 0, bedrest begins at 1420

## 2013-03-26 ENCOUNTER — Encounter (HOSPITAL_COMMUNITY): Payer: Self-pay | Admitting: Pharmacy Technician

## 2013-03-27 ENCOUNTER — Other Ambulatory Visit: Payer: Self-pay | Admitting: Interventional Cardiology

## 2013-03-28 ENCOUNTER — Ambulatory Visit (HOSPITAL_COMMUNITY)
Admission: RE | Admit: 2013-03-28 | Discharge: 2013-03-29 | Disposition: A | Discharge status: Home / Self Care | Payer: Medicare Other | Source: Ambulatory Visit | Attending: Interventional Cardiology | Admitting: Interventional Cardiology

## 2013-03-28 ENCOUNTER — Encounter (HOSPITAL_COMMUNITY): Payer: Self-pay | Admitting: General Practice

## 2013-03-28 ENCOUNTER — Encounter (HOSPITAL_COMMUNITY)
Admission: RE | Disposition: A | Discharge status: Home / Self Care | Payer: Self-pay | Source: Ambulatory Visit | Attending: Interventional Cardiology

## 2013-03-28 DIAGNOSIS — I2510 Atherosclerotic heart disease of native coronary artery without angina pectoris: Secondary | ICD-10-CM | POA: Insufficient documentation

## 2013-03-28 DIAGNOSIS — Z7902 Long term (current) use of antithrombotics/antiplatelets: Secondary | ICD-10-CM | POA: Insufficient documentation

## 2013-03-28 DIAGNOSIS — M1710 Unilateral primary osteoarthritis, unspecified knee: Secondary | ICD-10-CM

## 2013-03-28 DIAGNOSIS — Z955 Presence of coronary angioplasty implant and graft: Secondary | ICD-10-CM

## 2013-03-28 DIAGNOSIS — R9430 Abnormal result of cardiovascular function study, unspecified: Secondary | ICD-10-CM

## 2013-03-28 DIAGNOSIS — I472 Ventricular tachycardia, unspecified: Secondary | ICD-10-CM | POA: Insufficient documentation

## 2013-03-28 DIAGNOSIS — I428 Other cardiomyopathies: Secondary | ICD-10-CM | POA: Insufficient documentation

## 2013-03-28 DIAGNOSIS — R55 Syncope and collapse: Secondary | ICD-10-CM

## 2013-03-28 DIAGNOSIS — I441 Atrioventricular block, second degree: Secondary | ICD-10-CM

## 2013-03-28 DIAGNOSIS — I2584 Coronary atherosclerosis due to calcified coronary lesion: Secondary | ICD-10-CM | POA: Insufficient documentation

## 2013-03-28 DIAGNOSIS — R9389 Abnormal findings on diagnostic imaging of other specified body structures: Secondary | ICD-10-CM | POA: Insufficient documentation

## 2013-03-28 DIAGNOSIS — I4729 Other ventricular tachycardia: Secondary | ICD-10-CM | POA: Insufficient documentation

## 2013-03-28 HISTORY — PX: PERCUTANEOUS CORONARY ROTOBLATOR INTERVENTION (PCI-R): SHX5484

## 2013-03-28 HISTORY — DX: Presence of cardiac pacemaker: Z95.0

## 2013-03-28 HISTORY — PX: CORONARY ANGIOPLASTY WITH STENT PLACEMENT: SHX49

## 2013-03-28 HISTORY — DX: Personal history of other diseases of the digestive system: Z87.19

## 2013-03-28 SURGERY — PERCUTANEOUS CORONARY ROTOBLATOR INTERVENTION (PCI-R)
Anesthesia: LOCAL

## 2013-03-28 MED ORDER — MIDAZOLAM HCL 2 MG/2ML IJ SOLN
INTRAMUSCULAR | Status: AC
  Filled 2013-03-28: qty 2

## 2013-03-28 MED ORDER — STUDY - INVESTIGATIONAL DRUG SIMPLE RECORD
0.7500 mg/kg | Freq: Once | Status: DC
  Filled 2013-03-28: qty 69.38

## 2013-03-28 MED ORDER — HYDRALAZINE HCL 20 MG/ML IJ SOLN: 10.0000 mg | INTRAMUSCULAR | Status: DC | PRN

## 2013-03-28 MED ORDER — ACETAMINOPHEN 500 MG PO TABS
1000.0000 mg | ORAL_TABLET | Freq: Four times a day (QID) | ORAL | Status: DC | PRN
  Filled 2013-03-28: qty 2

## 2013-03-28 MED ORDER — DIAZEPAM 5 MG PO TABS
ORAL_TABLET | ORAL | Status: AC
  Filled 2013-03-28: qty 1

## 2013-03-28 MED ORDER — ASPIRIN EC 81 MG PO TBEC
81.0000 mg | DELAYED_RELEASE_TABLET | Freq: Every day | ORAL | Status: DC
  Administered 2013-03-29: 81 mg via ORAL
  Filled 2013-03-28 (×2): qty 1

## 2013-03-28 MED ORDER — SODIUM CHLORIDE 0.9 % IJ SOLN: 3.0000 mL | INTRAMUSCULAR | Status: DC | PRN

## 2013-03-28 MED ORDER — DIAZEPAM 5 MG PO TABS
5.0000 mg | ORAL_TABLET | ORAL | Status: AC
  Administered 2013-03-28: 5 mg via ORAL

## 2013-03-28 MED ORDER — STUDY - INVESTIGATIONAL DRUG SIMPLE RECORD
1.7500 mg/kg/h | Freq: Once | Status: DC
  Filled 2013-03-28: qty 250

## 2013-03-28 MED ORDER — METOPROLOL TARTRATE 25 MG PO TABS
25.0000 mg | ORAL_TABLET | Freq: Two times a day (BID) | ORAL | Status: DC
  Administered 2013-03-28 – 2013-03-29 (×2): 25 mg via ORAL
  Filled 2013-03-28 (×3): qty 1

## 2013-03-28 MED ORDER — SODIUM CHLORIDE 0.9 % IV SOLN
INTRAVENOUS | Status: DC
  Administered 2013-03-28: 10:00:00 via INTRAVENOUS

## 2013-03-28 MED ORDER — ASPIRIN 81 MG PO CHEW
CHEWABLE_TABLET | ORAL | Status: AC
  Filled 2013-03-28: qty 4

## 2013-03-28 MED ORDER — HEPARIN (PORCINE) IN NACL 2-0.9 UNIT/ML-% IJ SOLN
INTRAMUSCULAR | Status: AC
  Filled 2013-03-28: qty 1000

## 2013-03-28 MED ORDER — TESTOSTERONE 50 MG/5GM (1%) TD GEL: 5.0000 g | Freq: Every morning | TRANSDERMAL | Status: DC

## 2013-03-28 MED ORDER — VERAPAMIL HCL 2.5 MG/ML IV SOLN
INTRAVENOUS | Status: AC
  Filled 2013-03-28: qty 4

## 2013-03-28 MED ORDER — CLOPIDOGREL BISULFATE 75 MG PO TABS
75.0000 mg | ORAL_TABLET | Freq: Every day | ORAL | Status: DC
  Administered 2013-03-29: 10:00:00 75 mg via ORAL
  Filled 2013-03-28: qty 1

## 2013-03-28 MED ORDER — SODIUM CHLORIDE 0.9 % IV SOLN: 250.0000 mL | INTRAVENOUS | Status: DC | PRN

## 2013-03-28 MED ORDER — HEPARIN SODIUM (PORCINE) 1000 UNIT/ML IJ SOLN
INTRAMUSCULAR | Status: AC
  Filled 2013-03-28: qty 1

## 2013-03-28 MED ORDER — ASPIRIN 81 MG PO CHEW
324.0000 mg | CHEWABLE_TABLET | ORAL | Status: AC
  Administered 2013-03-28: 324 mg via ORAL

## 2013-03-28 MED ORDER — HYDRALAZINE HCL 20 MG/ML IJ SOLN
INTRAMUSCULAR | Status: AC
  Filled 2013-03-28: qty 1

## 2013-03-28 MED ORDER — ONDANSETRON HCL 4 MG/2ML IJ SOLN: 4.0000 mg | Freq: Four times a day (QID) | INTRAMUSCULAR | Status: DC | PRN

## 2013-03-28 MED ORDER — HYDRALAZINE HCL 20 MG/ML IJ SOLN
10.0000 mg | INTRAMUSCULAR | Status: DC | PRN
  Administered 2013-03-28: 10 mg via INTRAVENOUS

## 2013-03-28 MED ORDER — FENTANYL CITRATE 0.05 MG/ML IJ SOLN
INTRAMUSCULAR | Status: AC
  Filled 2013-03-28: qty 2

## 2013-03-28 MED ORDER — SODIUM CHLORIDE 0.9 % IV SOLN: 1.0000 mL/kg/h | INTRAVENOUS | Status: AC

## 2013-03-28 MED ORDER — ACETAMINOPHEN 325 MG PO TABS: 650.0000 mg | ORAL_TABLET | ORAL | Status: DC | PRN

## 2013-03-28 MED ORDER — CLOPIDOGREL BISULFATE 300 MG PO TABS
ORAL_TABLET | ORAL | Status: AC
Start: 2013-03-28 — End: 2013-03-28
  Filled 2013-03-28: qty 1

## 2013-03-28 MED ORDER — VITAMIN D3 25 MCG (1000 UNIT) PO TABS
1000.0000 [IU] | ORAL_TABLET | Freq: Two times a day (BID) | ORAL | Status: DC
  Administered 2013-03-28: 1000 [IU] via ORAL
  Filled 2013-03-28 (×3): qty 1

## 2013-03-28 MED ORDER — SODIUM CHLORIDE 0.9 % IJ SOLN: 3.0000 mL | Freq: Two times a day (BID) | INTRAMUSCULAR | Status: DC

## 2013-03-28 MED ORDER — LIDOCAINE HCL (PF) 1 % IJ SOLN
INTRAMUSCULAR | Status: AC
  Filled 2013-03-28: qty 30

## 2013-03-28 NOTE — Progress Notes (Signed)
Utilization Review Completed Zilda No J. Brison Fiumara, RN, BSN, NCM [phone]  

## 2013-03-28 NOTE — H&P (Signed)
  Date of Initial H&P: 03/19/13  History reviewed, patient examined, no change in status, stable for PCI.  Patient with nonsustained VT and proximal LAD disease of significance.  He also has proximal circumflex disease in the setting of LVEF <35%.  Discussed PCI procedure with rotational atherectomy with patient and family and they are agreeable to procedure.

## 2013-03-28 NOTE — Research (Signed)
REGULATE-PCI Informed Consent   Subject Name: Gregory Mccormick  Subject met inclusion and exclusion criteria.  The informed consent form, study requirements and expectations were reviewed with the subject and questions and concerns were addressed prior to the signing of the consent form.  The subject verbalized understanding of the trail requirements.  The subject agreed to participate in the [facility]  trial and signed the informed consent.  The informed consent was obtained prior to performance of any protocol-specific procedures for the subject.  A copy of the signed informed consent was given to the subject and a copy was placed in the subject's medical record.  The ICF was signed on 03/28/2013 at 1153.  Claire Shown 03/28/2013, 12:56 PM

## 2013-03-28 NOTE — Progress Notes (Signed)
Site area: right groin  Site Prior to Removal:  Level 1  Pressure Applied For 20 MINUTES    Minutes Beginning at 1650  Manual:   yes  Patient Status During Pull:  AAO X 3  Post Pull Groin Site:  Level 1  Post Pull Instructions Given:  yes  Post Pull Pulses Present:  yes  Dressing Applied:  yes  Comments:  Tolerated procedure well

## 2013-03-28 NOTE — CV Procedure (Signed)
PROCEDURE:  PCI proximal and mid LAD, rotational atherectomy LAD, PCI left circumflex  INDICATIONS:  Abnormal echocardiogram showing Cardiomyopathy with ejection fraction less than 30%; significant proximal LAD disease.  The risks, benefits, and details of the procedure were explained to the patient.  The patient verbalized understanding and wanted to proceed.  Informed written consent was obtained.  PROCEDURE TECHNIQUE:  After Xylocaine anesthesia a 47F sheath was placed in the right femoral artery with a single anterior needle wall stick.   Left coronary angiography was done using a Judkins L4 guide catheter.  Right coronary angiography was done using a Judkins R4 guide catheter.  Left ventriculography was done using a pigtail catheter.    CONTRAST:  Total of 245 cc.  COMPLICATIONS:  None.      ANGIOGRAPHIC DATA:   The left main coronary artery has mild ostial disease.  The left anterior descending artery is heavily calcified proximally.  There is diffuse disease up to 80%.  In the mid vessel, there is a 90% stenosis.  The left circumflex artery is a large vessel with a proximal, focal 80% stenosis.   PCI NARRATIVE:   A CLS 3.5 Guide catheter was used.  The patient was enrolled in the regulated PCI study and was randomized to Angiomax for anticoagulation.  An ACT was used to check that the Angiomax is therapeutic.  Initial attempts were made to wire the LAD with the Rotafloppy wire, but this is unsuccessful.  A 300 cm pro-water wire was placed across the area of disease in the proximal to mid LAD.  An over-the-wire balloon was used to maintain position in the distal LAD.  The wire was exchanged for the Rotafloppy wire.  Initially, a 1.5 mm burr was used and several passes were made through the proximal and mid LAD.  There is significant improvement in the angiographic appearance in the mid LAD.  Subsequently, a 2.0 mm burr was advanced and multiple passes were made in the proximal to mid LAD,  before the bend in the mid vessel.  There was significant improvement in the angiographic result.  Subsequent, a 2.75 x 10 cutting balloon was advanced to the area of disease in the mid LAD.  Several inflations were performed.  There were some difficulty in getting this balloon out due to tortuosity of the LAD.  The balloon ruptured after a couple of inflations.  It was removed and angiography showed TIMI-3 flow.  There is a small linear dissection where the initial inflation had been performed.  A 2.75 x 38 Promus drug-eluting stent was then advanced to the mid LAD.  This was deployed at 14 atmospheres.  A 3.0 x 20 Promus drug-eluting stent was then deployed in overlapping the proximal edge of the previously placed stent.  The stent balloon was used to post dilate the 2.75 mm stent.  This was inflated to 16 atmospheres.  The stent balloon was then used to post dilate the more proximal stent, inflated to 20 atmospheres.  There is an excellent angiographic result with TIMI 3 flow maintained in both the circumflex and the ramus.  The more proximal stent extended just to the LAD ostium.  The pro-water wire was redirected into the left circumflex.  Initially, we'll consider rotational atherectomy for the circumflex, but there did not appear to be as much calcium and this lesion.  A new 2.75 x 10 cutting balloon was advanced to the proximal circumflex lesion and inflated.  A 3.0 x 16 Promus drug-eluting stent was  then placed and deployed at 14 atmospheres.  The stent was postdilated with a 3.5 x 12 Lefors Quantum apex balloon, inflated up to 16 atmospheres.  There is no residual stenosis.  TIMI-3 flow was maintained.  This procedure was complex and took over two hours to complete.  IMPRESSIONS:  1. Successful rotational atherectomy of the proximal to mid left anterior descending artery.  Cutting Balloon angioplasty of the proximal to mid LAD followed by overlapping drug-eluting stent placement.  In the mid LAD, there  is a 2.75 x 38 Promus drug-eluting stent, postdilated to greater than 3 mm.  More proximally, there is a 3.0 x 20 Promus drug-eluting stent, postdilated to 3.24 mm. 2. Successful PCI of the proximal left circumflex artery with a 3.0 x 16 Promus drug-eluting stent, postdilated to greater than 3.5 mm in diameter. 3. Right femoral angiography showed a high bifurcation of the SFA and profunda femoral artery.  RECOMMENDATION:  The patient we watched overnight.  Continue dual antiplatelet therapy indefinitely.  Continue aggressive secondary prevention.  Titrate heart failure medications given his decreased LV function.  Now that he has been revascularized, hopefully his LV function will improve.  Will recheck echocardiogram in approximately 3 months.  Aggressive hydration post procedure as well.

## 2013-03-29 LAB — BASIC METABOLIC PANEL
BUN: 13 mg/dL (ref 6–23)
CO2: 24 mEq/L (ref 19–32)
Chloride: 103 mEq/L (ref 96–112)
Creatinine, Ser: 0.92 mg/dL (ref 0.50–1.35)
GFR calc Af Amer: 89 mL/min — ABNORMAL LOW (ref >90)
Glucose, Bld: 93 mg/dL (ref 70–99)
Potassium: 4 mEq/L (ref 3.5–5.1)

## 2013-03-29 LAB — CBC
HCT: 38.8 % — ABNORMAL LOW (ref 39.0–52.0)
Hemoglobin: 12.7 g/dL — ABNORMAL LOW (ref 13.0–17.0)
MCV: 85.7 fL (ref 78.0–100.0)
RBC: 4.53 MIL/uL (ref 4.22–5.81)
WBC: 6 10*3/uL (ref 4.0–10.5)

## 2013-03-29 MED ORDER — FUROSEMIDE 40 MG PO TABS
40.0000 mg | ORAL_TABLET | Freq: Every day | ORAL | Status: DC
  Administered 2013-03-29: 40 mg via ORAL
  Filled 2013-03-29: qty 1

## 2013-03-29 MED ORDER — ATORVASTATIN CALCIUM 10 MG PO TABS: 10.0000 mg | ORAL_TABLET | Freq: Every day | ORAL | Status: DC

## 2013-03-29 MED ORDER — ATORVASTATIN CALCIUM 10 MG PO TABS
10.0000 mg | ORAL_TABLET | Freq: Every day | ORAL | Status: DC
  Filled 2013-03-29: qty 1

## 2013-03-29 MED ORDER — FUROSEMIDE 40 MG PO TABS: 40.0000 mg | ORAL_TABLET | Freq: Every day | ORAL | Status: DC

## 2013-03-29 NOTE — Discharge Summary (Signed)
Patient ID: Gregory Mccormick MRN: [id number] DOB/AGE: 1929-04-10 79 y.o.  Admit date: 03/28/2013 Discharge date: 03/29/2013  Primary Discharge Diagnosis cardiomyopathy Secondary Discharge Diagnosis nonsustained ventricular tachycardia, coronary artery disease  Significant Diagnostic Studies: angiography: Cardiac cath with rotational atherectomy and drug-eluting stent placement into the proximal and mid LAD.  Drug-eluting stent placement to the proximal circumflex.  Consults: None  Hospital Course: 79 year old man who had a pacemaker check several weeks ago.  There is evidence of nonsustained ventricular tachycardia.  This prompted an echocardiogram.  His ejection fraction was severely decreased.  He underwent diagnostic cardiac catheterization which revealed severe proximal LAD disease as well as severe disease in the mid LAD.  There is also proximal circumflex disease of severe degree.  Due to the calcified nature of the lesions, rotational atherectomy was planned.  He underwent the above-mentioned procedure with successful drug-eluting stent placement into both the LAD and circumflex.  He tolerated the procedure well.  The next day, he walked with cardiac rehabilitation and had no problems.  No arrhythmias were noted.   Discharge Exam: Blood pressure 118/54, pulse 76, temperature 97.9 F (36.6 C), temperature source Oral, resp. rate 18, height 5\' 10"  (1.778 m), weight 93.7 kg (206 lb 9.1 oz), SpO2 95.00%.    [place]/AT RRR, S1-S2 No wheezing No right groin hematoma Palpable right dorsalis pedis pulse   Labs:   Lab Results  Component Value Date   WBC 6.0 03/29/2013   HGB 12.7* 03/29/2013   HCT 38.8* 03/29/2013   MCV 85.7 03/29/2013   PLT 177 03/29/2013    Recent Labs Lab 03/29/13 0420  NA 138  K 4.0  CL 103  CO2 24  BUN 13  CREATININE 0.92  CALCIUM 9.0  GLUCOSE 93   Lab Results  Component Value Date   CKTOTAL 53 05/08/2009   CKMB 1.0 05/08/2009   TROPONINI  Value: 0.01        NO  INDICATION OF MYOCARDIAL INJURY. 05/08/2009    Lab Results  Component Value Date   CHOL  Value: 156        ATP III CLASSIFICATION:  <200     mg/dL   Desirable  829-562  mg/dL   Borderline High  >=130    mg/dL   High        06/06/5783   Lab Results  Component Value Date   HDL 42 05/08/2009   Lab Results  Component Value Date   LDLCALC  Value: 99        Total Cholesterol/HDL:CHD Risk Coronary Heart Disease Risk Table                     Men   Women  1/2 Average Risk   3.4   3.3  Average Risk       5.0   4.4  2 X Average Risk   9.6   7.1  3 X Average Risk  23.4   11.0        Use the calculated Patient Ratio above and the CHD Risk Table to determine the patient's CHD Risk.        ATP III CLASSIFICATION (LDL):  <100     mg/dL   Optimal  696-295  mg/dL   Near or Above                    Optimal  130-159  mg/dL   Borderline  284-132  mg/dL   High  >440  mg/dL   Very High 12/10/5619   Lab Results  Component Value Date   TRIG 73 05/08/2009   Lab Results  Component Value Date   CHOLHDL 3.7 05/08/2009   No results found for this basename: LDLDIRECT      None EKG: AV pacing  FOLLOW UP PLANS AND APPOINTMENTS Discharge Orders   Future Orders Complete By Expires     Amb Referral to Cardiac Rehabilitation  As directed         Medication List    STOP taking these medications       hydrochlorothiazide 12.5 MG tablet  Commonly known as:  HYDRODIURIL      TAKE these medications       acetaminophen 500 MG tablet  Commonly known as:  TYLENOL  Take 1,000 mg by mouth every 6 (six) hours as needed. For leg pain.     aspirin EC 81 MG tablet  Take 81 mg by mouth daily.     atorvastatin 10 MG tablet  Commonly known as:  LIPITOR  Take 1 tablet (10 mg total) by mouth daily at 6 PM.     cholecalciferol 1000 UNITS tablet  Commonly known as:  VITAMIN D  Take 1,000 Units by mouth 2 (two) times daily.     clopidogrel 75 MG tablet  Commonly known as:  PLAVIX  Take 75 mg by mouth daily.     fish  oil-omega-3 fatty acids 1000 MG capsule  Take 1 g by mouth daily.     furosemide 40 MG tablet  Commonly known as:  LASIX  Take 1 tablet (40 mg total) by mouth daily.     metoprolol tartrate 25 MG tablet  Commonly known as:  LOPRESSOR  Take 25 mg by mouth 2 (two) times daily.     TESTIM 50 MG/5GM Gel  Generic drug:  testosterone  Place 5 g onto the skin every morning.           Follow-up Information   Follow up with Corky Crafts., MD. Schedule an appointment as soon as possible for a visit in 2 weeks.   Contact information:   [address] [place] [address] [phone]       BRING ALL MEDICATIONS WITH YOU TO FOLLOW UP APPOINTMENTS  Time spent with patient to include physician time: 20 minutes Signed: Emmely Bittinger S. 03/29/2013, 9:07 AM

## 2013-03-29 NOTE — Progress Notes (Signed)
CARDIAC REHAB PHASE I   PRE:  Rate/Rhythm: 89 paced,PVcs  BP:  Supine:   Sitting: 118/54  Standing:    SaO2:   MODE:  Ambulation: 700 ft   POST:  Rate/Rhythm: 104 paced,PVCs  BP:  Supine:   Sitting: 159/61  Standing:    SaO2:  0805-0907 Pt walked 700 ft with steady gait. Tolerated well. No CP. Education completed with pt and wife. Gave CHF packet and reviewed since pt has low EF. Discussed low sodium diet. Wife states pt uses salt shaker. Discussed CRP2 and permission given to refer to [facility] Phase 2.   Luetta Nutting, RN BSN  03/29/2013 9:02 AM

## 2013-04-18 ENCOUNTER — Ambulatory Visit (HOSPITAL_COMMUNITY): Payer: Medicare Other

## 2013-04-22 ENCOUNTER — Ambulatory Visit (HOSPITAL_COMMUNITY): Payer: Medicare Other

## 2013-04-23 ENCOUNTER — Telehealth (HOSPITAL_COMMUNITY): Payer: Self-pay | Admitting: Cardiac Rehabilitation

## 2013-04-23 NOTE — Telephone Encounter (Signed)
Spoke to pt to reschedule cardiac rehab orientation. Pt states he is unable to afford to participate in cardiac rehab due to high deductible.  Pt not interested in rescheduling at a later time.

## 2013-04-24 ENCOUNTER — Ambulatory Visit (HOSPITAL_COMMUNITY): Payer: Medicare Other

## 2013-04-26 ENCOUNTER — Ambulatory Visit (HOSPITAL_COMMUNITY): Payer: Medicare Other

## 2013-04-29 ENCOUNTER — Ambulatory Visit (HOSPITAL_COMMUNITY): Payer: Medicare Other

## 2013-05-01 ENCOUNTER — Ambulatory Visit (HOSPITAL_COMMUNITY): Payer: Medicare Other

## 2013-05-03 ENCOUNTER — Ambulatory Visit (HOSPITAL_COMMUNITY): Payer: Medicare Other

## 2013-05-06 ENCOUNTER — Ambulatory Visit (HOSPITAL_COMMUNITY): Payer: Medicare Other

## 2013-05-08 ENCOUNTER — Ambulatory Visit (HOSPITAL_COMMUNITY): Payer: Medicare Other

## 2013-05-10 ENCOUNTER — Ambulatory Visit (HOSPITAL_COMMUNITY): Payer: Medicare Other

## 2013-05-13 ENCOUNTER — Ambulatory Visit (HOSPITAL_COMMUNITY): Payer: Medicare Other

## 2013-05-15 ENCOUNTER — Ambulatory Visit (HOSPITAL_COMMUNITY): Payer: Medicare Other

## 2013-05-17 ENCOUNTER — Ambulatory Visit (HOSPITAL_COMMUNITY): Payer: Medicare Other

## 2013-05-20 ENCOUNTER — Ambulatory Visit (HOSPITAL_COMMUNITY): Payer: Medicare Other

## 2013-05-22 ENCOUNTER — Ambulatory Visit (HOSPITAL_COMMUNITY): Payer: Medicare Other

## 2013-05-24 ENCOUNTER — Ambulatory Visit (HOSPITAL_COMMUNITY): Payer: Medicare Other

## 2013-05-27 ENCOUNTER — Ambulatory Visit (HOSPITAL_COMMUNITY): Payer: Medicare Other

## 2013-05-29 ENCOUNTER — Ambulatory Visit (HOSPITAL_COMMUNITY): Payer: Medicare Other

## 2013-05-31 ENCOUNTER — Ambulatory Visit (HOSPITAL_COMMUNITY): Payer: Medicare Other

## 2013-06-03 ENCOUNTER — Ambulatory Visit (HOSPITAL_COMMUNITY): Payer: Medicare Other

## 2013-06-05 ENCOUNTER — Ambulatory Visit (HOSPITAL_COMMUNITY): Payer: Medicare Other

## 2013-06-07 ENCOUNTER — Ambulatory Visit (HOSPITAL_COMMUNITY): Payer: Medicare Other

## 2013-06-10 ENCOUNTER — Ambulatory Visit (HOSPITAL_COMMUNITY): Payer: Medicare Other

## 2013-06-12 ENCOUNTER — Ambulatory Visit (HOSPITAL_COMMUNITY): Payer: Medicare Other

## 2013-06-14 ENCOUNTER — Ambulatory Visit (HOSPITAL_COMMUNITY): Payer: Medicare Other

## 2013-06-17 ENCOUNTER — Ambulatory Visit (HOSPITAL_COMMUNITY): Payer: Medicare Other

## 2013-06-19 ENCOUNTER — Ambulatory Visit (HOSPITAL_COMMUNITY): Payer: Medicare Other

## 2013-06-21 ENCOUNTER — Ambulatory Visit (HOSPITAL_COMMUNITY): Payer: Medicare Other

## 2013-06-24 ENCOUNTER — Ambulatory Visit (HOSPITAL_COMMUNITY): Payer: Medicare Other

## 2013-06-26 ENCOUNTER — Ambulatory Visit (HOSPITAL_COMMUNITY): Payer: Medicare Other

## 2013-06-28 ENCOUNTER — Ambulatory Visit (HOSPITAL_COMMUNITY): Payer: Medicare Other

## 2013-07-01 ENCOUNTER — Ambulatory Visit (HOSPITAL_COMMUNITY): Payer: Medicare Other

## 2013-07-03 ENCOUNTER — Ambulatory Visit (HOSPITAL_COMMUNITY): Payer: Medicare Other

## 2013-07-05 ENCOUNTER — Ambulatory Visit (HOSPITAL_COMMUNITY): Payer: Medicare Other

## 2013-07-08 ENCOUNTER — Ambulatory Visit (HOSPITAL_COMMUNITY): Payer: Medicare Other

## 2013-07-10 ENCOUNTER — Ambulatory Visit (HOSPITAL_COMMUNITY): Payer: Medicare Other

## 2013-07-12 ENCOUNTER — Ambulatory Visit (HOSPITAL_COMMUNITY): Payer: Medicare Other

## 2013-07-15 ENCOUNTER — Ambulatory Visit (HOSPITAL_COMMUNITY): Payer: Medicare Other

## 2013-07-17 ENCOUNTER — Ambulatory Visit (HOSPITAL_COMMUNITY): Payer: Medicare Other

## 2013-07-19 ENCOUNTER — Ambulatory Visit (HOSPITAL_COMMUNITY): Payer: Medicare Other

## 2013-07-22 ENCOUNTER — Ambulatory Visit (HOSPITAL_COMMUNITY): Payer: Medicare Other

## 2013-07-24 ENCOUNTER — Ambulatory Visit (HOSPITAL_COMMUNITY): Payer: Medicare Other

## 2013-07-26 ENCOUNTER — Ambulatory Visit (HOSPITAL_COMMUNITY): Payer: Medicare Other

## 2013-09-04 ENCOUNTER — Ambulatory Visit (INDEPENDENT_AMBULATORY_CARE_PROVIDER_SITE_OTHER): Payer: Medicare Other | Admitting: *Deleted

## 2013-09-04 DIAGNOSIS — I441 Atrioventricular block, second degree: Secondary | ICD-10-CM

## 2013-09-04 LAB — PACEMAKER DEVICE OBSERVATION
AL IMPEDENCE PM: 426 Ohm
ATRIAL PACING PM: 45
BATTERY VOLTAGE: 2.79 V
RV LEAD IMPEDENCE PM: 781 Ohm

## 2013-09-04 NOTE — Progress Notes (Signed)
Pacemaker check in clinic. Normal device function. Thresholds, sensing, impedances consistent with previous measurements. Device programmed to maximize longevity. 6 mode switches all <1 minute.  No high ventricular rates noted.  1,610,960 single PVC's.   Device programmed at appropriate safety margins. Histogram distribution appropriate for patient activity level. Device programmed to optimize intrinsic conduction. Estimated longevity 12 years. Patient enrolled in remote follow-up/TTM's with [employer]. Plan to follow every 3 months remotely and see annually in office. Patient education completed.  ROV in February with Dr. [name].

## 2013-09-13 ENCOUNTER — Encounter: Payer: Self-pay | Admitting: *Deleted

## 2013-12-05 ENCOUNTER — Encounter: Payer: Self-pay | Admitting: Internal Medicine

## 2013-12-05 ENCOUNTER — Ambulatory Visit (INDEPENDENT_AMBULATORY_CARE_PROVIDER_SITE_OTHER): Payer: Medicare Other | Admitting: Internal Medicine

## 2013-12-05 ENCOUNTER — Encounter: Payer: Medicare Other | Admitting: Internal Medicine

## 2013-12-05 VITALS — BP 126/72 | HR 74 | Ht 70.0 in | Wt 208.0 lb

## 2013-12-05 DIAGNOSIS — I519 Heart disease, unspecified: Secondary | ICD-10-CM | POA: Insufficient documentation

## 2013-12-05 DIAGNOSIS — I10 Essential (primary) hypertension: Secondary | ICD-10-CM

## 2013-12-05 DIAGNOSIS — I441 Atrioventricular block, second degree: Secondary | ICD-10-CM

## 2013-12-05 LAB — MDC_IDC_ENUM_SESS_TYPE_INCLINIC
Battery Remaining Longevity: 146 mo
Brady Statistic AP VP Percent: 5 %
Brady Statistic AP VS Percent: 44 %
Brady Statistic AS VP Percent: 9 %
Brady Statistic AS VS Percent: 43 %
Date Time Interrogation Session: 20150205112332
Lead Channel Impedance Value: 748 Ohm
Lead Channel Pacing Threshold Amplitude: 0.5 V
Lead Channel Pacing Threshold Pulse Width: 0.4 ms
Lead Channel Sensing Intrinsic Amplitude: 11.2 mV
Lead Channel Sensing Intrinsic Amplitude: 2 mV
MDC IDC MSMT BATTERY IMPEDANCE: 157 Ohm
MDC IDC MSMT BATTERY VOLTAGE: 2.79 V
MDC IDC MSMT LEADCHNL RA IMPEDANCE VALUE: 436 Ohm
MDC IDC MSMT LEADCHNL RV PACING THRESHOLD AMPLITUDE: 0.5 V
MDC IDC MSMT LEADCHNL RV PACING THRESHOLD PULSEWIDTH: 0.4 ms
MDC IDC SET LEADCHNL RA PACING AMPLITUDE: 2 V
MDC IDC SET LEADCHNL RV PACING AMPLITUDE: 2.5 V
MDC IDC SET LEADCHNL RV PACING PULSEWIDTH: 0.4 ms
MDC IDC SET LEADCHNL RV SENSING SENSITIVITY: 5.6 mV

## 2013-12-05 NOTE — Progress Notes (Signed)
Gregory Hoff, MD: Primary Cardiologist:  Dr Alvester Morin is a 80 y.o. 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   The patient reports doing very well since having a pacemaker implanted and remains very active despite his age.  He underwent PCI 5/14 by Dr Eldridge Dace with improvement in EF from 35-40%.  He is not functionally limited at this time and is pleased with his health state.  Today, he  denies symptoms of palpitations, chest pain, shortness of breath, orthopnea, PND, lower extremity edema, dizziness, presyncope, syncope, or neurologic sequela.  The patientis tolerating medications without difficulties and is otherwise without complaint today.   Past Medical History  Diagnosis Date  . Obesity   . Vitamin D deficiency   . History of multiple pulmonary nodules   . Low testosterone   . ED (erectile dysfunction)   . Osteopenia   . OSA on CPAP     does not use cpap, does not like  . PONV (postoperative nausea and vomiting)   . Nonspecific abnormal unspecified cardiovascular function study   . Other primary cardiomyopathies   . Paroxysmal ventricular tachycardia   . Pacemaker   . H/O hiatal hernia   . GERD (gastroesophageal reflux disease)   . Arthritis     "right knee" (03/28/2013  . Exertional shortness of breath     "recently" (03/28/2013)  . Essential hypertension, benign   . Other testicular hypofunction   . Anemia, unspecified   . Internal hemorrhoids without mention of complication   . Blood in stool   . CAD in native artery   . Hyperlipidemia   . Anxiety state, unspecified   . Obesity   . Cardiomyopathy     EF 40%   Past Surgical History  Procedure Laterality Date  . Total knee arthroplasty Right 2007  . Knee cartilage surgery Left 1972    "cut it open" (03/28/2013)  . Shoulder surgery Right 1970's    "pulled muscle apart; sewed it back  together" (03/28/2013  . Total knee arthroplasty  11/26/2012    Procedure: TOTAL KNEE ARTHROPLASTY;  Surgeon: Loanne Drilling, MD;  Location: [facility];  Service: Orthopedics;  Laterality: Left;  . Cardiac catheterization  03/21/2013  . Coronary angioplasty with stent placement  03/28/2013    "3 stents" (03/28/2013)  . Knee cartilage surgery Right ~ 1977    "cut it open" (03/28/2013)  . Shoulder hemi-arthroplasty Right 1987    "got hit by sled & dragged down sheet > 200 feet; tore shoulder up" (03/28/2013)  . Cataract extraction w/ intraocular lens  implant, bilateral Bilateral 2000's  . Pacemaker insertion  10/05/10    MDT Adapta L implanted by Dr [name] for mobitz II second degree AV block    History   Social History  . Marital Status: Married    Spouse Name: N/A    Number of Children: N/A  . Years of Education: N/A   Occupational History  . retired [employer]   Social History Main Topics  . Smoking status: Former Smoker -- 1.00 packs/day for 26 years    Types: Cigarettes    Quit date: 10/31/1973  . Smokeless tobacco: Never Used  . Alcohol Use: No  . Drug Use: No  . Sexual Activity: No   Other Topics Concern  . Not on file   Social History Narrative  . No  narrative on file    Family History  Problem Relation Age of Onset  . Cancer    . Heart disease    . CAD Brother 1975    Allergies  Allergen Reactions  . Hydromorphone Hcl Nausea And Vomiting  . Penicillins Hives    Current Outpatient Prescriptions  Medication Sig Dispense Refill  . acetaminophen (TYLENOL) 500 MG tablet Take 1,000 mg by mouth every 6 (six) hours as needed. For leg pain.      Marland Kitchen. aspirin EC 81 MG tablet Take 81 mg by mouth daily.      Marland Kitchen. atorvastatin (LIPITOR) 10 MG tablet Take 1 tablet (10 mg total) by mouth daily at 6 PM.  30 tablet  11  . cholecalciferol (VITAMIN D) 1000 UNITS tablet Take 1,000 Units by mouth 2 (two) times daily.      . clopidogrel (PLAVIX) 75 MG tablet Take 75 mg by mouth  daily.      . fish oil-omega-3 fatty acids 1000 MG capsule Take 1 g by mouth daily.      . furosemide (LASIX) 40 MG tablet Take 1 tablet (40 mg total) by mouth daily.  30 tablet  11  . metoprolol tartrate (LOPRESSOR) 25 MG tablet Take 25 mg by mouth 2 (two) times daily.      Marland Kitchen. testosterone (TESTIM) 50 MG/5GM GEL Place 5 g onto the skin every morning.       No current facility-administered medications for this visit.    ROS- all systems are reviewed and negative except as per HPI  Physical Exam: Filed Vitals:   12/05/13 1101  BP: 126/72  Pulse: 74  Height: 5\' 10"  (1.778 m)  Weight: 208 lb (94.348 kg)    GEN- The patient is well appearing, alert and oriented x 3 today.   Head- normocephalic, atraumatic Eyes-  Sclera clear, conjunctiva pink Ears- hearing intact Oropharynx- clear Neck- supple, no JVP Lymph- no cervical lymphadenopathy Lungs- Clear to ausculation bilaterally, normal work of breathing Chest- pacemaker pocket is well healed Heart- Regular rate and rhythm, no murmurs, rubs or gallops, PMI not laterally displaced GI- soft, NT, ND, + BS Extremities- no clubbing, cyanosis, or edema MS- no significant deformity or atrophy Skin- no rash or lesion Psych- euthymic mood, full affect Neuro- strength and sensation are intact  Pacemaker interrogation- reviewed in detail today,  See PACEART report  Assessment and Plan:  1. Mobitz II second degree AV block Normal pacemaker function See Pace Art report No changes today He does not V pace to any significant degree  2. Ischemic CM (EF 40%) Not an ICD candidate Medical management advised  3. CAD No ischemic symptoms  4. HTN Stable No change required today  carelink Return to see Nehemiah SettleBrooke in 1 year

## 2013-12-05 NOTE — Patient Instructions (Signed)
Your physician wants you to follow-up in: 12 months with Gregory Mccormick You will receive a reminder letter in the mail two months in advance. If you don't receive a letter, please call our office to schedule the follow-up appointment.    Remote monitoring is used to monitor your Pacemaker or ICD from home. This monitoring reduces the number of office visits required to check your device to one time per year. It allows [place] to keep an eye on the functioning of your device to ensure it is working properly. You are scheduled for a device check from home on 03/10/14. You may send your transmission at any time that day. If you have a wireless device, the transmission will be sent automatically. After your physician reviews your transmission, you will receive a postcard with your next transmission date.

## 2013-12-09 ENCOUNTER — Encounter: Payer: Self-pay | Admitting: Internal Medicine

## 2014-01-14 ENCOUNTER — Ambulatory Visit: Payer: Medicare Other | Admitting: Interventional Cardiology

## 2014-02-13 ENCOUNTER — Ambulatory Visit (INDEPENDENT_AMBULATORY_CARE_PROVIDER_SITE_OTHER): Payer: Medicare Other | Admitting: Interventional Cardiology

## 2014-02-13 ENCOUNTER — Encounter: Payer: Self-pay | Admitting: Interventional Cardiology

## 2014-02-13 VITALS — BP 130/70 | HR 64 | Ht 70.5 in | Wt 206.0 lb

## 2014-02-13 DIAGNOSIS — I2510 Atherosclerotic heart disease of native coronary artery without angina pectoris: Secondary | ICD-10-CM | POA: Insufficient documentation

## 2014-02-13 DIAGNOSIS — I2589 Other forms of chronic ischemic heart disease: Secondary | ICD-10-CM

## 2014-02-13 DIAGNOSIS — I10 Essential (primary) hypertension: Secondary | ICD-10-CM

## 2014-02-13 DIAGNOSIS — I428 Other cardiomyopathies: Secondary | ICD-10-CM

## 2014-02-13 NOTE — Progress Notes (Signed)
Patient ID: Gregory Mccormick, male   DOB: [birth year], 80 y.o.   MRN: [id number]    [address] [place], [place]  [address] Phone: [phone] Fax:  [phone]  Date:  02/13/2014   ID:  Gregory Mccormick, DOB [birth year], MRN [id number]  PCP:  Sissy Hoff, MD      History of Present Illness: Gregory Mccormick is a 80 y.o. male who had a cardiomyopathy. He was found to have CAD. He had stents to the LAD and circumflex. He has more energy. No bleeding in that groin. No CP. No SHOB. No bleeding problems on Plavix. ECHO showed mildly improvement in LV dysfunction. No orthopnea.  No edema, palpitations.   Wt Readings from Last 3 Encounters:  02/13/14 206 lb (93.441 kg)  12/05/13 208 lb (94.348 kg)  03/29/13 206 lb 9.1 oz (93.7 kg)     Past Medical History  Diagnosis Date  . Obesity   . Vitamin D deficiency   . History of multiple pulmonary nodules   . Low testosterone   . ED (erectile dysfunction)   . Osteopenia   . OSA on CPAP     does not use cpap, does not like  . PONV (postoperative nausea and vomiting)   . Nonspecific abnormal unspecified cardiovascular function study   . Other primary cardiomyopathies   . Paroxysmal ventricular tachycardia   . Pacemaker   . H/O hiatal hernia   . GERD (gastroesophageal reflux disease)   . Arthritis     "right knee" (03/28/2013  . Exertional shortness of breath     "recently" (03/28/2013)  . Essential hypertension, benign   . Other testicular hypofunction   . Anemia, unspecified   . Internal hemorrhoids without mention of complication   . Blood in stool   . CAD in native artery   . Hyperlipidemia   . Anxiety state, unspecified   . Obesity   . Cardiomyopathy     EF 40%    Current Outpatient Prescriptions  Medication Sig Dispense Refill  . acetaminophen (TYLENOL) 500 MG tablet Take 1,000 mg by mouth every 6 (six) hours as needed. For leg pain.      Marland Kitchen aspirin EC 81 MG tablet Take 81 mg by mouth daily.      Marland Kitchen  atorvastatin (LIPITOR) 10 MG tablet Take 1 tablet (10 mg total) by mouth daily at 6 PM.  30 tablet  11  . cholecalciferol (VITAMIN D) 1000 UNITS tablet Take 1,000 Units by mouth 2 (two) times daily.      . clopidogrel (PLAVIX) 75 MG tablet Take 75 mg by mouth daily.      . fish oil-omega-3 fatty acids 1000 MG capsule Take 1 g by mouth daily.      . furosemide (LASIX) 40 MG tablet Take 1 tablet (40 mg total) by mouth daily.  30 tablet  11  . metoprolol tartrate (LOPRESSOR) 25 MG tablet Take 25 mg by mouth 2 (two) times daily.      Marland Kitchen testosterone (TESTIM) 50 MG/5GM GEL Place 5 g onto the skin every morning.       No current facility-administered medications for this visit.    Allergies:    Allergies  Allergen Reactions  . Hydromorphone Hcl Nausea And Vomiting  . Penicillins Hives    Social History:  The patient  reports that he quit smoking about 40 years ago. His smoking use included Cigarettes. He has a 26 pack-year smoking history. He has never  used smokeless tobacco. He reports that he does not drink alcohol or use illicit drugs.   Family History:  The patient's family history includes CAD (age of onset: 4075) in his brother; Cancer in an other family member; Heart disease in an other family member.   ROS:  Please see the history of present illness.  No nausea, vomiting.  No fevers, chills.  No focal weakness.  No dysuria.    All other systems reviewed and negative.   PHYSICAL EXAM: VS:  BP 130/70  Pulse 64  Ht 5' 10.5" (1.791 m)  Wt 206 lb (93.441 kg)  BMI 29.13 kg/m2 Well nourished, well developed, in no acute distress HEENT: normal Neck: no JVD, no carotid bruits Cardiac:  normal S1, S2; RRR;  Lungs:  clear to auscultation bilaterally, no wheezing, rhonchi or rales Abd: soft, nontender, no hepatomegaly Ext: no edema Skin: warm and dry Neuro:   no focal abnormalities noted     ASSESSMENT AND PLAN:  CAD in native artery  Continue Plavix Tablet, 75 MG, 1 tablet, Orally,  Once a day Continue Aspirin Tablet, 81 MG, one tablet, Orally, every day  Notes: No angina.  No bleeding problems.   2. Hyperlipidemia   LDL target < 100.  Continue atorvastatin.   3. Cardiomyopathy  Notes: Persistent despite revacularization. No CHF.     Labs    Lab: ALT (SGPT) (Ordered for 07/04/2013)  ALT 14  0-52 - U/L   Latandra Loureiro,JAY 07/08/2013 10:51:56 PM > normal liver test Stegall,Amy 07/09/2013 12:15:40 PM > Pts wife notified per dpr.        Lab: Lipid Panel w/reflex (Ordered for 07/04/2013)  Cholesterol 118  <200 - mg/dL  TRIG 69  10-6098-199 - mg/dL  HDLD 47  96-0430-70 - mg/dL  Calc LDL 58  5-400-99 - mg/dL  Chol/HDL ratio 2.5  9.8-1.12.0-4.0 - Ratio   Toneka Fullen,JAY 07/08/2013 10:50:49 PM > controlled Stegall,Amy 07/09/2013 12:15:59 PM > pts wife notified per dpr.         Routine pacer checks.     Signed, Fredric MareJay S. Josselyn Harkins, MD, [facility] 02/13/2014 3:20 PM

## 2014-02-13 NOTE — Patient Instructions (Signed)
Your physician recommends that you continue on your current medications as directed. Please refer to the Current Medication list given to you today.  Your physician has requested that you have an echocardiogram in 6 months. Echocardiography is a painless test that uses sound waves to create images of your heart. It provides your doctor with information about the size and shape of your heart and how well your heart's chambers and valves are working. This procedure takes approximately one hour. There are no restrictions for this procedure.  Your physician wants you to follow-up in: 6 months with Dr. Eldridge Dace (echo can be done same day or a few days before) You will receive a reminder letter in the mail two months in advance. If you don't receive a letter, please call our office to schedule the follow-up appointment.

## 2014-02-25 ENCOUNTER — Ambulatory Visit (HOSPITAL_COMMUNITY): Payer: Medicare Other | Attending: Cardiovascular Disease | Admitting: Cardiology

## 2014-02-25 DIAGNOSIS — I2589 Other forms of chronic ischemic heart disease: Secondary | ICD-10-CM

## 2014-02-25 DIAGNOSIS — I428 Other cardiomyopathies: Secondary | ICD-10-CM

## 2014-02-25 DIAGNOSIS — I2510 Atherosclerotic heart disease of native coronary artery without angina pectoris: Secondary | ICD-10-CM

## 2014-02-25 NOTE — Progress Notes (Signed)
Echo performed. 

## 2014-03-17 ENCOUNTER — Telehealth: Payer: Self-pay | Admitting: Cardiology

## 2014-03-17 ENCOUNTER — Ambulatory Visit (INDEPENDENT_AMBULATORY_CARE_PROVIDER_SITE_OTHER): Payer: Medicare Other | Admitting: *Deleted

## 2014-03-17 DIAGNOSIS — I441 Atrioventricular block, second degree: Secondary | ICD-10-CM

## 2014-03-17 DIAGNOSIS — R55 Syncope and collapse: Secondary | ICD-10-CM

## 2014-03-17 NOTE — Progress Notes (Signed)
Remote pacemaker transmission.   

## 2014-03-17 NOTE — Telephone Encounter (Signed)
Spoke with pt wife and reminded pt wife of remote transmission that is due today. Pt wife verbalized understanding.

## 2014-03-19 LAB — MDC_IDC_ENUM_SESS_TYPE_REMOTE
Battery Remaining Longevity: 129 mo
Brady Statistic AP VP Percent: 7 %
Lead Channel Pacing Threshold Amplitude: 0.75 V
Lead Channel Sensing Intrinsic Amplitude: 1.4 mV
Lead Channel Sensing Intrinsic Amplitude: 11.2 mV
Lead Channel Setting Pacing Amplitude: 2 V
Lead Channel Setting Pacing Pulse Width: 0.4 ms
MDC IDC MSMT BATTERY IMPEDANCE: 181 Ohm
MDC IDC MSMT BATTERY VOLTAGE: 2.78 V
MDC IDC MSMT LEADCHNL RA IMPEDANCE VALUE: 436 Ohm
MDC IDC MSMT LEADCHNL RA PACING THRESHOLD AMPLITUDE: 0.875 V
MDC IDC MSMT LEADCHNL RA PACING THRESHOLD PULSEWIDTH: 0.4 ms
MDC IDC MSMT LEADCHNL RV IMPEDANCE VALUE: 823 Ohm
MDC IDC MSMT LEADCHNL RV PACING THRESHOLD PULSEWIDTH: 0.4 ms
MDC IDC SESS DTM: 20150518205431
MDC IDC SET LEADCHNL RV PACING AMPLITUDE: 2.5 V
MDC IDC SET LEADCHNL RV SENSING SENSITIVITY: 5.6 mV
MDC IDC STAT BRADY AP VS PERCENT: 42 %
MDC IDC STAT BRADY AS VP PERCENT: 11 %
MDC IDC STAT BRADY AS VS PERCENT: 41 %

## 2014-03-20 ENCOUNTER — Other Ambulatory Visit: Payer: Self-pay | Admitting: *Deleted

## 2014-03-20 MED ORDER — FUROSEMIDE 40 MG PO TABS: 40.0000 mg | ORAL_TABLET | Freq: Every day | ORAL | Status: DC

## 2014-03-26 ENCOUNTER — Other Ambulatory Visit (HOSPITAL_COMMUNITY): Payer: Self-pay | Admitting: Interventional Cardiology

## 2014-03-28 ENCOUNTER — Encounter: Payer: Self-pay | Admitting: Cardiology

## 2014-04-01 ENCOUNTER — Other Ambulatory Visit: Payer: Self-pay

## 2014-04-01 MED ORDER — ATORVASTATIN CALCIUM 10 MG PO TABS: 10.0000 mg | ORAL_TABLET | Freq: Every day | ORAL | Status: DC

## 2014-04-03 ENCOUNTER — Other Ambulatory Visit: Payer: Self-pay | Admitting: *Deleted

## 2014-04-03 MED ORDER — METOPROLOL TARTRATE 25 MG PO TABS: 25.0000 mg | ORAL_TABLET | Freq: Two times a day (BID) | ORAL | Status: DC

## 2014-04-08 ENCOUNTER — Encounter: Payer: Self-pay | Admitting: Internal Medicine

## 2014-04-11 ENCOUNTER — Other Ambulatory Visit: Payer: Self-pay | Admitting: Interventional Cardiology

## 2014-06-18 ENCOUNTER — Telehealth: Payer: Self-pay | Admitting: Cardiology

## 2014-06-18 ENCOUNTER — Encounter: Payer: Medicare Other | Admitting: *Deleted

## 2014-06-18 NOTE — Telephone Encounter (Signed)
Confirmed remote transmission with pt wife.  

## 2014-06-20 ENCOUNTER — Encounter: Payer: Self-pay | Admitting: Cardiology

## 2014-06-25 ENCOUNTER — Ambulatory Visit (INDEPENDENT_AMBULATORY_CARE_PROVIDER_SITE_OTHER): Payer: Medicare Other | Admitting: *Deleted

## 2014-06-25 DIAGNOSIS — I441 Atrioventricular block, second degree: Secondary | ICD-10-CM

## 2014-06-26 NOTE — Progress Notes (Signed)
Remote pacemaker transmission.   

## 2014-06-30 LAB — MDC_IDC_ENUM_SESS_TYPE_REMOTE
Brady Statistic AP VP Percent: 5 %
Brady Statistic AS VP Percent: 8 %
Brady Statistic AS VS Percent: 48 %
Lead Channel Impedance Value: 748 Ohm
Lead Channel Pacing Threshold Amplitude: 0.875 V
Lead Channel Pacing Threshold Pulse Width: 0.4 ms
Lead Channel Sensing Intrinsic Amplitude: 1.4 mV
Lead Channel Sensing Intrinsic Amplitude: 11.2 mV
MDC IDC MSMT BATTERY IMPEDANCE: 204 Ohm
MDC IDC MSMT BATTERY REMAINING LONGEVITY: 126 mo
MDC IDC MSMT BATTERY VOLTAGE: 2.79 V
MDC IDC MSMT LEADCHNL RA IMPEDANCE VALUE: 449 Ohm
MDC IDC MSMT LEADCHNL RV PACING THRESHOLD AMPLITUDE: 0.75 V
MDC IDC MSMT LEADCHNL RV PACING THRESHOLD PULSEWIDTH: 0.4 ms
MDC IDC SESS DTM: 20150826185022
MDC IDC SET LEADCHNL RA PACING AMPLITUDE: 2 V
MDC IDC SET LEADCHNL RV PACING AMPLITUDE: 2.5 V
MDC IDC SET LEADCHNL RV PACING PULSEWIDTH: 0.4 ms
MDC IDC SET LEADCHNL RV SENSING SENSITIVITY: 4 mV
MDC IDC STAT BRADY AP VS PERCENT: 39 %

## 2014-07-01 ENCOUNTER — Encounter: Payer: Self-pay | Admitting: Cardiology

## 2014-07-21 ENCOUNTER — Encounter: Payer: Self-pay | Admitting: Internal Medicine

## 2014-07-23 ENCOUNTER — Other Ambulatory Visit: Payer: Self-pay | Admitting: Interventional Cardiology

## 2014-08-15 ENCOUNTER — Other Ambulatory Visit (HOSPITAL_COMMUNITY): Payer: Self-pay | Admitting: Interventional Cardiology

## 2014-08-15 DIAGNOSIS — I25119 Atherosclerotic heart disease of native coronary artery with unspecified angina pectoris: Secondary | ICD-10-CM

## 2014-08-15 DIAGNOSIS — I350 Nonrheumatic aortic (valve) stenosis: Secondary | ICD-10-CM

## 2014-08-18 ENCOUNTER — Telehealth: Payer: Self-pay | Admitting: Interventional Cardiology

## 2014-08-18 ENCOUNTER — Ambulatory Visit (HOSPITAL_COMMUNITY): Payer: Medicare Other | Attending: Cardiology | Admitting: Cardiology

## 2014-08-18 ENCOUNTER — Ambulatory Visit: Payer: Medicare Other | Admitting: Interventional Cardiology

## 2014-08-18 DIAGNOSIS — Z87891 Personal history of nicotine dependence: Secondary | ICD-10-CM | POA: Insufficient documentation

## 2014-08-18 DIAGNOSIS — I10 Essential (primary) hypertension: Secondary | ICD-10-CM | POA: Diagnosis not present

## 2014-08-18 DIAGNOSIS — I2510 Atherosclerotic heart disease of native coronary artery without angina pectoris: Secondary | ICD-10-CM

## 2014-08-18 DIAGNOSIS — I428 Other cardiomyopathies: Secondary | ICD-10-CM

## 2014-08-18 DIAGNOSIS — I25119 Atherosclerotic heart disease of native coronary artery with unspecified angina pectoris: Secondary | ICD-10-CM

## 2014-08-18 DIAGNOSIS — I350 Nonrheumatic aortic (valve) stenosis: Secondary | ICD-10-CM

## 2014-08-18 DIAGNOSIS — I2589 Other forms of chronic ischemic heart disease: Secondary | ICD-10-CM

## 2014-08-18 NOTE — Telephone Encounter (Signed)
Normal LV function.  MIld AS.

## 2014-08-18 NOTE — Progress Notes (Signed)
Echo performed. 

## 2014-08-20 ENCOUNTER — Other Ambulatory Visit: Payer: Self-pay | Admitting: Interventional Cardiology

## 2014-08-27 ENCOUNTER — Other Ambulatory Visit: Payer: Self-pay | Admitting: Interventional Cardiology

## 2014-09-24 ENCOUNTER — Telehealth: Payer: Self-pay | Admitting: Cardiology

## 2014-09-24 ENCOUNTER — Ambulatory Visit (INDEPENDENT_AMBULATORY_CARE_PROVIDER_SITE_OTHER): Payer: Medicare Other | Admitting: *Deleted

## 2014-09-24 DIAGNOSIS — I4729 Other ventricular tachycardia: Secondary | ICD-10-CM

## 2014-09-24 DIAGNOSIS — R55 Syncope and collapse: Secondary | ICD-10-CM

## 2014-09-24 DIAGNOSIS — I472 Ventricular tachycardia: Secondary | ICD-10-CM

## 2014-09-24 LAB — MDC_IDC_ENUM_SESS_TYPE_REMOTE
Battery Impedance: 204 Ohm
Battery Remaining Longevity: 125 mo
Brady Statistic AP VP Percent: 5 %
Lead Channel Impedance Value: 435 Ohm
Lead Channel Pacing Threshold Pulse Width: 0.4 ms
Lead Channel Sensing Intrinsic Amplitude: 1.4 mV
Lead Channel Sensing Intrinsic Amplitude: 11.2 mV
Lead Channel Setting Pacing Amplitude: 2 V
Lead Channel Setting Pacing Pulse Width: 0.4 ms
MDC IDC MSMT BATTERY VOLTAGE: 2.79 V
MDC IDC MSMT LEADCHNL RA PACING THRESHOLD AMPLITUDE: 0.875 V
MDC IDC MSMT LEADCHNL RA PACING THRESHOLD PULSEWIDTH: 0.4 ms
MDC IDC MSMT LEADCHNL RV IMPEDANCE VALUE: 807 Ohm
MDC IDC MSMT LEADCHNL RV PACING THRESHOLD AMPLITUDE: 0.75 V
MDC IDC SESS DTM: 20151125172019
MDC IDC SET LEADCHNL RV PACING AMPLITUDE: 2.5 V
MDC IDC SET LEADCHNL RV SENSING SENSITIVITY: 5.6 mV
MDC IDC STAT BRADY AP VS PERCENT: 38 %
MDC IDC STAT BRADY AS VP PERCENT: 10 %
MDC IDC STAT BRADY AS VS PERCENT: 48 %

## 2014-09-24 NOTE — Telephone Encounter (Signed)
Spoke with pt and reminded pt of remote transmission that is due today. Pt verbalized understanding.   

## 2014-09-24 NOTE — Progress Notes (Signed)
Remote pacemaker transmission.   

## 2014-09-29 ENCOUNTER — Other Ambulatory Visit: Payer: Self-pay | Admitting: Interventional Cardiology

## 2014-09-30 ENCOUNTER — Ambulatory Visit (INDEPENDENT_AMBULATORY_CARE_PROVIDER_SITE_OTHER): Payer: Medicare Other | Admitting: Interventional Cardiology

## 2014-09-30 ENCOUNTER — Encounter: Payer: Self-pay | Admitting: Interventional Cardiology

## 2014-09-30 VITALS — BP 120/60 | HR 69 | Ht 69.0 in | Wt 207.6 lb

## 2014-09-30 DIAGNOSIS — I10 Essential (primary) hypertension: Secondary | ICD-10-CM

## 2014-09-30 DIAGNOSIS — I519 Heart disease, unspecified: Secondary | ICD-10-CM

## 2014-09-30 DIAGNOSIS — I2510 Atherosclerotic heart disease of native coronary artery without angina pectoris: Secondary | ICD-10-CM

## 2014-09-30 NOTE — Progress Notes (Signed)
Patient ID: Gregory Mccormick, male   DOB: 05/25/1929, 80 y.o.   MRN: [id number] Patient ID: Gregory Mccormick, male   DOB: 05/25/1929, 80 y.o.   MRN: [id number]    [address] [place], [place]  [address] Phone: [phone]) [phone] Fax:  [phone]  Date:  09/30/2014   ID:  Gregory Mccormick, DOB 05/25/1929, MRN [id number]  PCP:  Sissy HoffSWAYNE,DAVID W, MD      History of Present Illness: Gregory CamelJack D Emanuele is a 80 y.o. male who had a cardiomyopathy. He was found to have CAD. He had stents to the LAD and circumflex in 2014. He has more energy.  No CP. No SHOB. No bleeding problems on Plavix. ECHO showed improvement in LV dysfunction. No orthopnea.  No edema, palpitations.   Wt Readings from Last 3 Encounters:  09/30/14 207 lb 9.6 oz (94.167 kg)  02/13/14 206 lb (93.441 kg)  12/05/13 208 lb (94.348 kg)     Past Medical History  Diagnosis Date  . Obesity   . Vitamin D deficiency   . History of multiple pulmonary nodules   . Low testosterone   . ED (erectile dysfunction)   . Osteopenia   . OSA on CPAP     does not use cpap, does not like  . PONV (postoperative nausea and vomiting)   . Nonspecific abnormal unspecified cardiovascular function study   . Other primary cardiomyopathies   . Paroxysmal ventricular tachycardia   . Pacemaker   . H/O hiatal hernia   . GERD (gastroesophageal reflux disease)   . Arthritis     "right knee" (03/28/2013  . Exertional shortness of breath     "recently" (03/28/2013)  . Essential hypertension, benign   . Other testicular hypofunction   . Anemia, unspecified   . Internal hemorrhoids without mention of complication   . Blood in stool   . CAD in native artery   . Hyperlipidemia   . Anxiety state, unspecified   . Obesity   . Cardiomyopathy     EF 40%    Current Outpatient Prescriptions  Medication Sig Dispense Refill  . acetaminophen (TYLENOL) 500 MG tablet Take 1,000 mg by mouth every 6 (six) hours as needed. For leg pain.    Marland Kitchen. atorvastatin  (LIPITOR) 10 MG tablet Take 1 tablet (10 mg total) by mouth daily at 6 PM. 30 tablet 6  . bacitracin ophthalmic ointment   0  . cholecalciferol (VITAMIN D) 1000 UNITS tablet Take 1,000 Units by mouth 2 (two) times daily.    . clopidogrel (PLAVIX) 75 MG tablet TAKE 1 TABLET EVERY DAY 30 tablet 5  . fish oil-omega-3 fatty acids 1000 MG capsule Take 1 g by mouth daily.    Marland Kitchen. FLUZONE HIGH-DOSE 0.5 ML SUSY   0  . furosemide (LASIX) 40 MG tablet TAKE 1 TABLET (40 MG TOTAL) BY MOUTH DAILY. 30 tablet 4  . metoprolol tartrate (LOPRESSOR) 25 MG tablet TAKE 1 TABLET (25 MG TOTAL) BY MOUTH 2 (TWO) TIMES DAILY. 60 tablet 0  . testosterone (TESTIM) 50 MG/5GM GEL Place 5 g onto the skin every morning.    Marland Kitchen. aspirin EC 81 MG tablet Take 81 mg by mouth daily.     No current facility-administered medications for this visit.    Allergies:    Allergies  Allergen Reactions  . Hydromorphone Hcl Nausea And Vomiting  . Penicillins Hives    Social History:  The patient  reports that he quit smoking about 40  years ago. His smoking use included Cigarettes. He has a 26 pack-year smoking history. He has never used smokeless tobacco. He reports that he does not drink alcohol or use illicit drugs.   Family History:  The patient's family history includes CAD (age of onset: 77) in his brother; Cancer in an other family member; Heart disease in an other family member.   ROS:  Please see the history of present illness.  No nausea, vomiting.  No fevers, chills.  No focal weakness.  No dysuria.    All other systems reviewed and negative.   PHYSICAL EXAM: VS:  BP 120/60 mmHg  Pulse 69  Ht 5\' 9"  (1.753 m)  Wt 207 lb 9.6 oz (94.167 kg)  BMI 30.64 kg/m2 Well nourished, well developed, in no acute distress HEENT: normal Neck: no JVD, no carotid bruits Cardiac:  normal S1, S2; RRR;  Lungs:  clear to auscultation bilaterally, no wheezing, rhonchi or rales Abd: soft, nontender, no hepatomegaly Ext: no edema Skin: warm and  dry Neuro:   no focal abnormalities noted     ASSESSMENT AND PLAN:  CAD in native artery  Continue Plavix Tablet, 75 MG, 1 tablet, Orally, Once a day Off of Aspirin Tablet, 81 MG, one tablet, Orally, every day  Notes: No angina.  No bleeding problems.   2. Hyperlipidemia   LDL target < 100.  Continue atorvastatin. Followed by Dr. Azucena Cecil.    3. Cardiomyopathy  Notes: Resolved after revacularization by most recent echo in 10/15. No CHF.     Labs    Lab: ALT (SGPT) (Ordered for 07/04/2013)  ALT 14  0-52 - U/L   Aubrii Sharpless,JAY 07/08/2013 10:51:56 PM > normal liver test Stegall,Amy 07/09/2013 12:15:40 PM > Pts wife notified per dpr.        Lab: Lipid Panel w/reflex (Ordered for 07/04/2013)  Cholesterol 118  <200 - mg/dL  TRIG 69  5-427 - mg/dL  HDLD 47  06-23 - mg/dL  Calc LDL 58  7-62 - mg/dL  Chol/HDL ratio 2.5  8.3-1.5 - Ratio   Edmond Ginsberg,JAY 07/08/2013 10:50:49 PM > controlled Stegall,Amy 07/09/2013 12:15:59 PM > pts wife notified per dpr.         Routine pacer checks.     Signed, Fredric Mare, MD, [facility] 09/30/2014 1:59 PM

## 2014-09-30 NOTE — Patient Instructions (Signed)
Your physician recommends that you continue on your current medications as directed. Please refer to the Current Medication list given to you today.  Your physician wants you to follow-up in: 1 YEAR WITH DR. VARANASI. You will receive a reminder letter in the mail two months in advance. If you don't receive a letter, please call our office to schedule the follow-up appointment.  

## 2014-10-05 ENCOUNTER — Other Ambulatory Visit: Payer: Self-pay | Admitting: Interventional Cardiology

## 2014-10-07 ENCOUNTER — Encounter: Payer: Self-pay | Admitting: Cardiology

## 2014-10-09 ENCOUNTER — Encounter (HOSPITAL_COMMUNITY): Payer: Self-pay | Admitting: Interventional Cardiology

## 2014-10-15 ENCOUNTER — Encounter: Payer: Self-pay | Admitting: Internal Medicine

## 2014-10-29 ENCOUNTER — Other Ambulatory Visit: Payer: Self-pay | Admitting: Interventional Cardiology

## 2014-12-08 ENCOUNTER — Encounter: Payer: Self-pay | Admitting: Internal Medicine

## 2014-12-08 ENCOUNTER — Ambulatory Visit (INDEPENDENT_AMBULATORY_CARE_PROVIDER_SITE_OTHER): Payer: Medicare Other | Admitting: Internal Medicine

## 2014-12-08 VITALS — BP 114/78 | HR 60 | Ht 68.0 in | Wt 209.6 lb

## 2014-12-08 DIAGNOSIS — I472 Ventricular tachycardia: Secondary | ICD-10-CM

## 2014-12-08 DIAGNOSIS — I4729 Other ventricular tachycardia: Secondary | ICD-10-CM

## 2014-12-08 DIAGNOSIS — R55 Syncope and collapse: Secondary | ICD-10-CM

## 2014-12-08 DIAGNOSIS — I441 Atrioventricular block, second degree: Secondary | ICD-10-CM

## 2014-12-08 DIAGNOSIS — I10 Essential (primary) hypertension: Secondary | ICD-10-CM

## 2014-12-08 NOTE — Patient Instructions (Signed)
Remote monitoring is used to monitor your pacemaker from home. This monitoring reduces the number of office visits required to check your device to one time per year. It allows [place] to keep an eye on the functioning of your device to ensure it is working properly. You are scheduled for a device check from home on 03/09/2015. You may send your transmission at any time that day. If you have a wireless device, the transmission will be sent automatically. After your physician reviews your transmission, you will receive a postcard with your next transmission date.  Your physician recommends that you schedule a follow-up appointment in: 12 months with Dr.Allred

## 2014-12-08 NOTE — Progress Notes (Signed)
Electrophysiology Office Note   Date:  12/08/2014   ID:  Gregory Mccormick, DOB Jan 05, 1929, MRN [id number]  PCP:  Sissy Hoff, MD  Cardiologist:  Dr Eldridge Dace Primary Electrophysiologist: Hillis Range, MD    Chief Complaint  Patient presents with  . Fatigue    Mobitz (type II) AV block     History of Present Illness: Gregory Mccormick is a 81 y.o. male who presents today for electrophysiology evaluation.   He has done very well since his last visit.  He feels "great" today.  He remains active for his age. Today, he denies symptoms of palpitations, chest pain, shortness of breath, orthopnea, PND, lower extremity edema, claudication, dizziness, presyncope, syncope, bleeding, or neurologic sequela. The patient is tolerating medications without difficulties and is otherwise without complaint today.    Past Medical History  Diagnosis Date  . Obesity   . Vitamin D deficiency   . History of multiple pulmonary nodules   . Low testosterone   . ED (erectile dysfunction)   . Osteopenia   . OSA on CPAP     does not use cpap, does not like  . PONV (postoperative nausea and vomiting)   . Nonspecific abnormal unspecified cardiovascular function study   . Other primary cardiomyopathies   . Paroxysmal ventricular tachycardia   . Pacemaker   . H/O hiatal hernia   . GERD (gastroesophageal reflux disease)   . Arthritis     "right knee" (03/28/2013  . Exertional shortness of breath     "recently" (03/28/2013)  . Essential hypertension, benign   . Other testicular hypofunction   . Anemia, unspecified   . Internal hemorrhoids without mention of complication   . Blood in stool   . CAD in native artery   . Hyperlipidemia   . Anxiety state, unspecified   . Obesity   . Cardiomyopathy     EF 40%   Past Surgical History  Procedure Laterality Date  . Total knee arthroplasty Right 2007  . Knee cartilage surgery Left 1972    "cut it open" (03/28/2013)  . Shoulder surgery Right 1970's   "pulled muscle apart; sewed it back together" (03/28/2013  . Total knee arthroplasty  11/26/2012    Procedure: TOTAL KNEE ARTHROPLASTY;  Surgeon: Loanne Drilling, MD;  Location: [facility];  Service: Orthopedics;  Laterality: Left;  . Cardiac catheterization  03/21/2013  . Coronary angioplasty with stent placement  03/28/2013    "3 stents" (03/28/2013)  . Knee cartilage surgery Right ~ 1977    "cut it open" (03/28/2013)  . Shoulder hemi-arthroplasty Right 1987    "got hit by sled & dragged down sheet > 200 feet; tore shoulder up" (03/28/2013)  . Cataract extraction w/ intraocular lens  implant, bilateral Bilateral 2000's  . Pacemaker insertion  10/05/10    MDT Adapta L implanted by Dr [name] for mobitz II second degree AV block  . Percutaneous coronary rotoblator intervention (pci-r) N/A 03/28/2013    Procedure: PERCUTANEOUS CORONARY ROTOBLATOR INTERVENTION (PCI-R);  Surgeon: Corky Crafts, MD;  Location: [facility] C[facility] LAB;  Service: Cardiovascular;  Laterality: N/A;     Current Outpatient Prescriptions  Medication Sig Dispense Refill  . acetaminophen (TYLENOL) 500 MG tablet Take 1,000 mg by mouth every 6 (six) hours as needed. For leg pain.    Marland Kitchen atorvastatin (LIPITOR) 10 MG tablet TAKE 1 TABLET (10 MG TOTAL) BY MOUTH DAILY AT 6 PM. 30 tablet 12  . bacitracin ophthalmic ointment Place 1 application into both eyes  3 (three) times daily.   0  . cholecalciferol (VITAMIN D) 1000 UNITS tablet Take 1,000 Units by mouth 2 (two) times daily.    . clopidogrel (PLAVIX) 75 MG tablet TAKE 1 TABLET EVERY DAY (Patient taking differently: TAKE 1 TABLET BY MOUTH EVERY DAY) 30 tablet 11  . fish oil-omega-3 fatty acids 1000 MG capsule Take 1 g by mouth daily.    Marland Kitchen FLUZONE HIGH-DOSE 0.5 ML SUSY   0  . furosemide (LASIX) 40 MG tablet TAKE 1 TABLET (40 MG TOTAL) BY MOUTH DAILY. 30 tablet 4  . metoprolol tartrate (LOPRESSOR) 25 MG tablet TAKE 1 TABLET (25 MG TOTAL) BY MOUTH 2 (TWO) TIMES DAILY. 60 tablet 11  .  testosterone (TESTIM) 50 MG/5GM GEL Place 5 g onto the skin every morning.     No current facility-administered medications for this visit.    Allergies:   Hydromorphone hcl and Penicillins   Social History:  The patient  reports that he quit smoking about 41 years ago. His smoking use included Cigarettes. He has a 26 pack-year smoking history. He has never used smokeless tobacco. He reports that he does not drink alcohol or use illicit drugs.   Family History:  The patient's family history includes CAD (age of onset: 93) in his brother; Cancer in an other family member; Heart disease in an other family member.    ROS:  Please see the history of present illness.   All other systems are reviewed and negative.    PHYSICAL EXAM: VS:  BP 114/78 mmHg  Pulse 60  Ht  (1.727 m)  Wt 209 lb 9.6 oz (95.074 kg)  BMI 31.88 kg/m2 , BMI Body mass index is 31.88 kg/(m^2). GEN: Well nourished, well developed, in no acute distress HEENT: normal Neck: no JVD, carotid bruits, or masses Cardiac: RRR; no murmurs, rubs, or gallops,no edema  Respiratory:  clear to auscultation bilaterally, normal work of breathing GI: soft, nontender, nondistended, + BS MS: no deformity or atrophy Skin: warm and dry, device pocket is well healed Neuro:  Strength and sensation are intact Psych: euthymic mood, full affect  Device interrogation is reviewed today in detail.  See PaceArt for details.   Recent Labs: No results found for requested labs within last 365 days.    Lipid Panel     Component Value Date/Time   CHOL  05/08/2009 0337    156        ATP III CLASSIFICATION:  <200     mg/dL   Desirable  161-096  mg/dL   Borderline High  >=045    mg/dL   High          TRIG 73 05/08/2009 0337   HDL 42 05/08/2009 0337   CHOLHDL 3.7 05/08/2009 0337   VLDL 15 05/08/2009 0337   LDLCALC  05/08/2009 0337    99        Total Cholesterol/HDL:CHD Risk Coronary Heart Disease Risk Table                     Men    Women  1/2 Average Risk   3.4   3.3  Average Risk       5.0   4.4  2 X Average Risk   9.6   7.1  3 X Average Risk  23.4   11.0        Use the calculated Patient Ratio above and the CHD Risk Table to determine the patient's CHD Risk.  ATP III CLASSIFICATION (LDL):  <100     mg/dL   Optimal  524-818  mg/dL   Near or Above                    Optimal  130-159  mg/dL   Borderline  590-931  mg/dL   High  >121     mg/dL   Very High     Wt Readings from Last 3 Encounters:  12/08/14 209 lb 9.6 oz (95.074 kg)  09/30/14 207 lb 9.6 oz (94.167 kg)  02/13/14 206 lb (93.441 kg)      Other studies Reviewed: Additional studies/ records that were reviewed today include: Dr Hoyle Barr last note is reviewed    ASSESSMENT AND PLAN:  1. Mobitz II second degree AV block Normal pacemaker function See Pace Art report No changes today  2. Ischemic CM- improved s/p PCI Not an ICD candidate Medical management advised  3. CAD No ischemic symptoms  4. HTN Stable No change required today  carelink Return to see EP NP in 1 year   Current medicines are reviewed at length with the patient today.   The patient does not have concerns regarding his medicines.  The following changes were made today:  none  Signed, Hillis Range, MD  12/08/2014 11:54 AM     [facility] [address] [place] [address] [phone] (office) [phone] (fax)

## 2014-12-09 LAB — MDC_IDC_ENUM_SESS_TYPE_INCLINIC
Battery Remaining Longevity: 125 mo
Brady Statistic AP VP Percent: 5 %
Brady Statistic AP VS Percent: 37 %
Brady Statistic AS VP Percent: 11 %
Date Time Interrogation Session: 20160208163802
Lead Channel Pacing Threshold Amplitude: 0.75 V
Lead Channel Pacing Threshold Pulse Width: 0.4 ms
Lead Channel Sensing Intrinsic Amplitude: 11.2 mV
Lead Channel Setting Pacing Amplitude: 2.5 V
Lead Channel Setting Pacing Pulse Width: 0.4 ms
Lead Channel Setting Sensing Sensitivity: 5.6 mV
MDC IDC MSMT BATTERY IMPEDANCE: 204 Ohm
MDC IDC MSMT BATTERY VOLTAGE: 2.79 V
MDC IDC MSMT LEADCHNL RA IMPEDANCE VALUE: 442 Ohm
MDC IDC MSMT LEADCHNL RA SENSING INTR AMPL: 2 mV
MDC IDC MSMT LEADCHNL RV IMPEDANCE VALUE: 729 Ohm
MDC IDC MSMT LEADCHNL RV PACING THRESHOLD AMPLITUDE: 0.75 V
MDC IDC MSMT LEADCHNL RV PACING THRESHOLD PULSEWIDTH: 0.4 ms
MDC IDC SET LEADCHNL RA PACING AMPLITUDE: 2 V
MDC IDC STAT BRADY AS VS PERCENT: 47 %

## 2014-12-11 ENCOUNTER — Encounter: Payer: Self-pay | Admitting: Internal Medicine

## 2015-01-29 ENCOUNTER — Other Ambulatory Visit: Payer: Self-pay | Admitting: Interventional Cardiology

## 2015-03-12 DIAGNOSIS — I441 Atrioventricular block, second degree: Secondary | ICD-10-CM | POA: Diagnosis not present

## 2015-03-13 ENCOUNTER — Ambulatory Visit (INDEPENDENT_AMBULATORY_CARE_PROVIDER_SITE_OTHER): Payer: Medicare Other | Admitting: *Deleted

## 2015-03-13 DIAGNOSIS — I441 Atrioventricular block, second degree: Secondary | ICD-10-CM

## 2015-03-13 LAB — CUP PACEART REMOTE DEVICE CHECK
Battery Voltage: 2.78 V
Brady Statistic AS VP Percent: 11 %
Brady Statistic AS VS Percent: 52 %
Lead Channel Impedance Value: 431 Ohm
Lead Channel Impedance Value: 805 Ohm
Lead Channel Sensing Intrinsic Amplitude: 1.4 mV
Lead Channel Setting Pacing Amplitude: 2.5 V
Lead Channel Setting Pacing Pulse Width: 0.4 ms
Lead Channel Setting Sensing Sensitivity: 5.6 mV
MDC IDC MSMT BATTERY IMPEDANCE: 228 Ohm
MDC IDC MSMT BATTERY REMAINING LONGEVITY: 122 mo
MDC IDC MSMT LEADCHNL RA PACING THRESHOLD AMPLITUDE: 1 V
MDC IDC MSMT LEADCHNL RA PACING THRESHOLD PULSEWIDTH: 0.4 ms
MDC IDC MSMT LEADCHNL RV PACING THRESHOLD AMPLITUDE: 0.75 V
MDC IDC MSMT LEADCHNL RV PACING THRESHOLD PULSEWIDTH: 0.4 ms
MDC IDC MSMT LEADCHNL RV SENSING INTR AMPL: 11.2 mV
MDC IDC SESS DTM: 20160512124509
MDC IDC SET LEADCHNL RA PACING AMPLITUDE: 2 V
MDC IDC STAT BRADY AP VP PERCENT: 5 %
MDC IDC STAT BRADY AP VS PERCENT: 32 %

## 2015-03-13 NOTE — Progress Notes (Signed)
Remote pacemaker transmission.   

## 2015-03-25 ENCOUNTER — Encounter: Payer: Self-pay | Admitting: Cardiology

## 2015-04-01 ENCOUNTER — Encounter: Payer: Self-pay | Admitting: Internal Medicine

## 2015-06-15 ENCOUNTER — Telehealth: Payer: Self-pay | Admitting: Cardiology

## 2015-06-15 ENCOUNTER — Encounter: Payer: Medicare Other | Admitting: *Deleted

## 2015-06-15 NOTE — Telephone Encounter (Signed)
LMOVM reminding pt to send remote transmission.   

## 2015-06-16 ENCOUNTER — Encounter: Payer: Self-pay | Admitting: Cardiology

## 2015-06-24 ENCOUNTER — Telehealth: Payer: Self-pay | Admitting: Internal Medicine

## 2015-06-24 ENCOUNTER — Ambulatory Visit (INDEPENDENT_AMBULATORY_CARE_PROVIDER_SITE_OTHER): Payer: Medicare Other | Admitting: *Deleted

## 2015-06-24 ENCOUNTER — Encounter: Payer: Self-pay | Admitting: Interventional Cardiology

## 2015-06-24 DIAGNOSIS — I441 Atrioventricular block, second degree: Secondary | ICD-10-CM | POA: Diagnosis not present

## 2015-06-24 NOTE — Telephone Encounter (Signed)
Informed pt that his transmission was received. Pt verbalized understanding.  

## 2015-06-24 NOTE — Telephone Encounter (Signed)
°  1. Has your device fired? No ° °2. Is you device beeping? No ° °3. Are you experiencing draining or swelling at device site? No ° °4. Are you calling to see if we received your device transmission? Yes ° °5. Have you passed out? No ° °

## 2015-06-24 NOTE — Telephone Encounter (Signed)
Attempted to call pt back. No answer and unable to leave a message.  

## 2015-06-24 NOTE — Progress Notes (Signed)
Remote pacemaker transmission.   

## 2015-07-08 LAB — CUP PACEART REMOTE DEVICE CHECK
Brady Statistic AP VP Percent: 6 %
Brady Statistic AS VP Percent: 11 %
Brady Statistic AS VS Percent: 45 %
Date Time Interrogation Session: 20160824101959
Lead Channel Pacing Threshold Amplitude: 0.625 V
Lead Channel Pacing Threshold Amplitude: 1 V
Lead Channel Pacing Threshold Pulse Width: 0.4 ms
Lead Channel Pacing Threshold Pulse Width: 0.4 ms
Lead Channel Sensing Intrinsic Amplitude: 16 mV
Lead Channel Setting Pacing Pulse Width: 0.4 ms
MDC IDC MSMT BATTERY IMPEDANCE: 228 Ohm
MDC IDC MSMT BATTERY REMAINING LONGEVITY: 121 mo
MDC IDC MSMT BATTERY VOLTAGE: 2.78 V
MDC IDC MSMT LEADCHNL RA IMPEDANCE VALUE: 426 Ohm
MDC IDC MSMT LEADCHNL RA SENSING INTR AMPL: 1.4 mV
MDC IDC MSMT LEADCHNL RV IMPEDANCE VALUE: 783 Ohm
MDC IDC SET LEADCHNL RA PACING AMPLITUDE: 2 V
MDC IDC SET LEADCHNL RV PACING AMPLITUDE: 2.5 V
MDC IDC SET LEADCHNL RV SENSING SENSITIVITY: 5.6 mV
MDC IDC STAT BRADY AP VS PERCENT: 39 %

## 2015-07-20 ENCOUNTER — Encounter: Payer: Self-pay | Admitting: *Deleted

## 2015-07-21 ENCOUNTER — Encounter: Payer: Self-pay | Admitting: Cardiology

## 2015-07-22 ENCOUNTER — Encounter: Payer: Self-pay | Admitting: Internal Medicine

## 2015-09-28 ENCOUNTER — Ambulatory Visit (INDEPENDENT_AMBULATORY_CARE_PROVIDER_SITE_OTHER): Payer: Medicare Other | Admitting: *Deleted

## 2015-09-28 ENCOUNTER — Telehealth: Payer: Self-pay | Admitting: Cardiology

## 2015-09-28 DIAGNOSIS — I441 Atrioventricular block, second degree: Secondary | ICD-10-CM | POA: Diagnosis not present

## 2015-09-28 NOTE — Telephone Encounter (Signed)
Spoke with pt and reminded pt of remote transmission that is due today. Pt verbalized understanding.   

## 2015-09-29 NOTE — Progress Notes (Signed)
Remote pacemaker transmission.   

## 2015-10-05 NOTE — Progress Notes (Signed)
Patient ID: Gregory Mccormick, male   DOB: [birth year], 81 y.o.   MRN: [id number]     Cardiology Office Note   Date:  10/06/2015   ID:  Gregory Mccormick, DOB April 06, 1929, MRN [id number]  PCP:  Sissy Hoff, MD    No chief complaint on file. f/u CAD   Wt Readings from Last 3 Encounters:  10/06/15 206 lb (93.441 kg)  12/08/14 209 lb 9.6 oz (95.074 kg)  09/30/14 207 lb 9.6 oz (94.167 kg)       History of Present Illness: Gregory Mccormick is a 81 y.o. male  who had a cardiomyopathy. He was found to have CAD. He had stents to the LAD and circumflex in 2014 with rotational atherectomy. He has more energy. No CP. No SHOB. No bleeding problems on Plavix. ECHO showed improvement in LV dysfunction. No orthopnea.  No edema, palpitations.  He had some dizziness with standing quickly.  He still works a part time job.  He drives inmates to several different prisons.        Past Medical History  Diagnosis Date  . Obesity   . Vitamin D deficiency   . History of multiple pulmonary nodules   . Low testosterone   . ED (erectile dysfunction)   . Osteopenia   . OSA on CPAP     does not use cpap, does not like  . PONV (postoperative nausea and vomiting)   . Nonspecific abnormal unspecified cardiovascular function study   . Other primary cardiomyopathies   . Paroxysmal ventricular tachycardia (HCC)   . Pacemaker   . H/O hiatal hernia   . GERD (gastroesophageal reflux disease)   . Arthritis     "right knee" (03/28/2013  . Exertional shortness of breath     "recently" (03/28/2013)  . Essential hypertension, benign   . Other testicular hypofunction   . Anemia, unspecified   . Internal hemorrhoids without mention of complication   . Blood in stool   . CAD in native artery   . Hyperlipidemia   . Anxiety state, unspecified   . Obesity   . Cardiomyopathy (HCC)     EF 40%    Past Surgical History  Procedure Laterality Date  . Total knee arthroplasty Right 2007  . Knee cartilage surgery  Left 1972    "cut it open" (03/28/2013)  . Shoulder surgery Right 1970's    "pulled muscle apart; sewed it back together" (03/28/2013  . Total knee arthroplasty  11/26/2012    Procedure: TOTAL KNEE ARTHROPLASTY;  Surgeon: Loanne Drilling, MD;  Location: [facility];  Service: Orthopedics;  Laterality: Left;  . Cardiac catheterization  03/21/2013  . Coronary angioplasty with stent placement  03/28/2013    "3 stents" (03/28/2013)  . Knee cartilage surgery Right ~ 1977    "cut it open" (03/28/2013)  . Shoulder hemi-arthroplasty Right 1987    "got hit by sled & dragged down sheet > 200 feet; tore shoulder up" (03/28/2013)  . Cataract extraction w/ intraocular lens  implant, bilateral Bilateral 2000's  . Pacemaker insertion  10/05/10    MDT Adapta L implanted by Dr [name] for mobitz II second degree AV block  . Percutaneous coronary rotoblator intervention (pci-r) N/A 03/28/2013    Procedure: PERCUTANEOUS CORONARY ROTOBLATOR INTERVENTION (PCI-R);  Surgeon: Corky Crafts, MD;  Location: [facility] CATH LAB;  Service: Cardiovascular;  Laterality: N/A;     Current Outpatient Prescriptions  Medication Sig Dispense Refill  . acetaminophen (TYLENOL) 500 MG tablet Take 1,000  mg by mouth every 6 (six) hours as needed. For leg pain.    Marland Kitchen atorvastatin (LIPITOR) 10 MG tablet TAKE 1 TABLET (10 MG TOTAL) BY MOUTH DAILY AT 6 PM. 30 tablet 12  . cholecalciferol (VITAMIN D) 1000 UNITS tablet Take 1,000 Units by mouth 2 (two) times daily.    . clopidogrel (PLAVIX) 75 MG tablet Take 75 mg by mouth daily.    . fish oil-omega-3 fatty acids 1000 MG capsule Take 1 g by mouth daily.    Marland Kitchen FLUZONE HIGH-DOSE 0.5 ML SUSY   0  . furosemide (LASIX) 40 MG tablet TAKE 1 TABLET (40 MG TOTAL) BY MOUTH DAILY. 30 tablet 9  . metoprolol tartrate (LOPRESSOR) 25 MG tablet TAKE 1 TABLET (25 MG TOTAL) BY MOUTH 2 (TWO) TIMES DAILY. 60 tablet 11  . testosterone (TESTIM) 50 MG/5GM GEL Place 5 g onto the skin every morning.     No current  facility-administered medications for this visit.    Allergies:   Hydromorphone hcl and Penicillins    Social History:  The patient  reports that he quit smoking about 41 years ago. His smoking use included Cigarettes. He has a 26 pack-year smoking history. He has never used smokeless tobacco. He reports that he does not drink alcohol or use illicit drugs.   Family History:  The patient's family history includes CAD (age of onset: 66) in his brother; Cancer in an other family member; Heart disease in an other family member; Stroke in his brother. There is no history of Heart attack or Hypertension.    ROS:  Please see the history of present illness.   Otherwise, review of systems are positive for dizziness.   All other systems are reviewed and negative.    PHYSICAL EXAM: VS:  BP 110/80 mmHg  Pulse 68  Ht  (1.727 m)  Wt 206 lb (93.441 kg)  BMI 31.33 kg/m2 , BMI Body mass index is 31.33 kg/(m^2). GEN: Well nourished, well developed, in no acute distress HEENT: normal Neck: no JVD, carotid bruits, or masses Cardiac: RRR; 2/6 early systolic murmur, rubs, or gallops,no edema  Respiratory:  clear to auscultation bilaterally, normal work of breathing GI: soft, nontender, nondistended, + BS MS: no deformity or atrophy Skin: warm and dry, no rash Neuro:  Strength and sensation are intact Psych: euthymic mood, full affect   EKG:   The ekg ordered today demonstrates atrial paced, no ST segment changes   Recent Labs: No results found for requested labs within last 365 days.   Lipid Panel    Component Value Date/Time   CHOL  05/08/2009 0337    156        ATP III CLASSIFICATION:  <200     mg/dL   Desirable  161-096  mg/dL   Borderline High  >=045    mg/dL   High          TRIG 73 05/08/2009 0337   HDL 42 05/08/2009 0337   CHOLHDL 3.7 05/08/2009 0337   VLDL 15 05/08/2009 0337   LDLCALC  05/08/2009 0337    99        Total Cholesterol/HDL:CHD Risk Coronary Heart Disease  Risk Table                     Men   Women  1/2 Average Risk   3.4   3.3  Average Risk       5.0   4.4  2 X Average Risk  9.6   7.1  3 X Average Risk  23.4   11.0        Use the calculated Patient Ratio above and the CHD Risk Table to determine the patient's CHD Risk.        ATP III CLASSIFICATION (LDL):  <100     mg/dL   Optimal  937-902  mg/dL   Near or Above                    Optimal  130-159  mg/dL   Borderline  409-735  mg/dL   High  >329     mg/dL   Very High     Other studies Reviewed: Additional studies/ records that were reviewed today with results demonstrating: normal EF in 8/15.   ASSESSMENT AND PLAN:  1. CAD: Continue Plavix Tablet, 75 MG, 1 tablet, Orally, Once a day Off of Aspirin Tablet, 81 MG, one tablet, Orally, every day Notes: No angina. No bleeding problems.   2. Hyperlipidemia: LDL target < 100. Continue atorvastatin. Followed by Dr. Azucena Cecil. THis will be checked next week.  3. Prior ischemic cardiomyopathy: improved EF at last check in 10/15.  BP well controlled on metoprolol.   Current medicines are reviewed at length with the patient today.  The patient concerns regarding his medicines were addressed.  The following changes have been made:  No change  Labs/ tests ordered today include:  No orders of the defined types were placed in this encounter.    Recommend 150 minutes/week of aerobic exercise Low fat, low carb, high fiber diet recommended  Disposition:   FU in 1 year   Delorise Nations., MD  10/06/2015 12:02 PM    [facility] [address] [place], [place], [place]  [address] Phone: [phone]; Fax: [phone]

## 2015-10-06 ENCOUNTER — Encounter: Payer: Self-pay | Admitting: Interventional Cardiology

## 2015-10-06 ENCOUNTER — Ambulatory Visit (INDEPENDENT_AMBULATORY_CARE_PROVIDER_SITE_OTHER): Payer: Medicare Other | Admitting: Interventional Cardiology

## 2015-10-06 VITALS — BP 110/80 | HR 68 | Ht 68.0 in | Wt 206.0 lb

## 2015-10-06 DIAGNOSIS — E785 Hyperlipidemia, unspecified: Secondary | ICD-10-CM | POA: Diagnosis not present

## 2015-10-06 DIAGNOSIS — I2510 Atherosclerotic heart disease of native coronary artery without angina pectoris: Secondary | ICD-10-CM | POA: Diagnosis not present

## 2015-10-06 DIAGNOSIS — I10 Essential (primary) hypertension: Secondary | ICD-10-CM

## 2015-10-06 NOTE — Patient Instructions (Signed)
**Note De-identified Gregory Mccormick Obfuscation** Medication Instructions:  Same-no changes  Labwork: None  Testing/Procedures: None  Follow-Up: Your physician wants you to follow-up in: 1 year. You will receive a reminder letter in the mail two months in advance. If you don't receive a letter, please call our office to schedule the follow-up appointment.      If you need a refill on your cardiac medications before your next appointment, please call your pharmacy.   

## 2015-10-08 LAB — CUP PACEART REMOTE DEVICE CHECK
Brady Statistic AP VP Percent: 5 %
Brady Statistic AP VS Percent: 42 %
Brady Statistic AS VP Percent: 9 %
Brady Statistic AS VS Percent: 44 %
Date Time Interrogation Session: 20161128164140
Implantable Lead Implant Date: 20111206
Lead Channel Setting Pacing Amplitude: 2 V
Lead Channel Setting Pacing Amplitude: 2.5 V
Lead Channel Setting Pacing Pulse Width: 0.4 ms
Lead Channel Setting Sensing Sensitivity: 5.6 mV
MDC IDC LEAD IMPLANT DT: 20111206
MDC IDC LEAD LOCATION: 753859
MDC IDC LEAD LOCATION: 753860
MDC IDC MSMT BATTERY IMPEDANCE: 252 Ohm
MDC IDC MSMT BATTERY REMAINING LONGEVITY: 117 mo
MDC IDC MSMT BATTERY VOLTAGE: 2.79 V
MDC IDC MSMT LEADCHNL RA IMPEDANCE VALUE: 441 Ohm
MDC IDC MSMT LEADCHNL RV IMPEDANCE VALUE: 824 Ohm

## 2015-10-09 ENCOUNTER — Encounter: Payer: Self-pay | Admitting: Cardiology

## 2015-10-11 ENCOUNTER — Other Ambulatory Visit: Payer: Self-pay | Admitting: Interventional Cardiology

## 2015-10-13 ENCOUNTER — Other Ambulatory Visit: Payer: Self-pay | Admitting: *Deleted

## 2015-10-13 DIAGNOSIS — R202 Paresthesia of skin: Secondary | ICD-10-CM

## 2015-10-13 NOTE — Progress Notes (Addendum)
Cardiology Office Note Date:  10/14/2015  Patient ID:  Gregory Mccormick, DOB 1928-12-14, MRN [id number] PCP:  Sissy Hoff, MD  Cardiologist:  Dr. Eldridge Dace Electrophysiologist: Dr. [name]   Chief Complaint: called in to new PAFib on his last remote transmission.  History of Present Illness: Gregory Mccormick is a 81 y.o. male with history of CAD (last intervention 2014), OSA he reports intolerant of CPAP, obesity, HTN, HLD, hx of Mobtitz II heart block s/p PPM, ICM with improved EF.  The patient comes in today called for a visit secondary to new PAFib on his last remote pacer transmission.  He feels very well, denies any kind of CP, palpitations or SOB, no dizziness, near syncope or syncope.  He walks his dog 3-4x day with good exertional capacity.  He denies any hematological history, history of bleeding or previous treatment with anticoagulation.  He is ON ASA Plavix without bleeding.  PMHX mentions anemia, blood in stool and hemorrhoids that he reports was many years ago, none in >5 years or so   CHADS2Vasc is at least 4, we disussed PAF and embolic/stroke risk he is agreeable to start anticoagulation, we discussed bleeding risk and bleeding precautions.  Past Medical History  Diagnosis Date  . Obesity   . Vitamin D deficiency   . History of multiple pulmonary nodules   . Low testosterone   . ED (erectile dysfunction)   . Osteopenia   . OSA on CPAP     does not use cpap, does not like  . PONV (postoperative nausea and vomiting)   . Nonspecific abnormal unspecified cardiovascular function study   . Other primary cardiomyopathies   . Paroxysmal ventricular tachycardia (HCC)   . Pacemaker   . H/O hiatal hernia   . GERD (gastroesophageal reflux disease)   . Arthritis     "right knee" (03/28/2013  . Exertional shortness of breath     "recently" (03/28/2013)  . Essential hypertension, benign   . Other testicular hypofunction   . Anemia, unspecified   . Internal hemorrhoids  without mention of complication   . Blood in stool   . CAD in native artery   . Hyperlipidemia   . Anxiety state, unspecified   . Obesity   . Cardiomyopathy (HCC)     EF 40%    Past Surgical History  Procedure Laterality Date  . Total knee arthroplasty Right 2007  . Knee cartilage surgery Left 1972    "cut it open" (03/28/2013)  . Shoulder surgery Right 1970's    "pulled muscle apart; sewed it back together" (03/28/2013  . Total knee arthroplasty  11/26/2012    Procedure: TOTAL KNEE ARTHROPLASTY;  Surgeon: Loanne Drilling, MD;  Location: [facility];  Service: Orthopedics;  Laterality: Left;  . Cardiac catheterization  03/21/2013  . Coronary angioplasty with stent placement  03/28/2013    "3 stents" (03/28/2013)  . Knee cartilage surgery Right ~ 1977    "cut it open" (03/28/2013)  . Shoulder hemi-arthroplasty Right 1987    "got hit by sled & dragged down sheet > 200 feet; tore shoulder up" (03/28/2013)  . Cataract extraction w/ intraocular lens  implant, bilateral Bilateral 2000's  . Pacemaker insertion  10/05/10    MDT Adapta L implanted by Dr [name] for mobitz II second degree AV block  . Percutaneous coronary rotoblator intervention (pci-r) N/A 03/28/2013    Procedure: PERCUTANEOUS CORONARY ROTOBLATOR INTERVENTION (PCI-R);  Surgeon: Corky Crafts, MD;  Location: [facility] CATH LAB;  Service: Cardiovascular;  Laterality: N/A;    Current Outpatient Prescriptions  Medication Sig Dispense Refill  . acetaminophen (TYLENOL) 500 MG tablet Take 1,000 mg by mouth every 6 (six) hours as needed. For leg pain.    Marland Kitchen atorvastatin (LIPITOR) 10 MG tablet TAKE 1 TABLET (10 MG TOTAL) BY MOUTH DAILY AT 6 PM. 30 tablet 12  . cholecalciferol (VITAMIN D) 1000 UNITS tablet Take 1,000 Units by mouth 2 (two) times daily.    . clopidogrel (PLAVIX) 75 MG tablet Take 75 mg by mouth daily.    . fish oil-omega-3 fatty acids 1000 MG capsule Take 1 g by mouth daily.    Marland Kitchen FLUZONE HIGH-DOSE 0.5 ML SUSY   0  . furosemide  (LASIX) 40 MG tablet TAKE 1 TABLET (40 MG TOTAL) BY MOUTH DAILY. 30 tablet 9  . metoprolol tartrate (LOPRESSOR) 25 MG tablet TAKE 1 TABLET (25 MG TOTAL) BY MOUTH 2 (TWO) TIMES DAILY. 60 tablet 11  . testosterone (TESTIM) 50 MG/5GM GEL Place 5 g onto the skin every morning.     No current facility-administered medications for this visit.    Allergies:   Hydromorphone hcl and Penicillins   Social History:  The patient  reports that he quit smoking about 41 years ago. His smoking use included Cigarettes. He has a 26 pack-year smoking history. He has never used smokeless tobacco. He reports that he does not drink alcohol or use illicit drugs.   Family History:  The patient's family history includes CAD (age of onset: 78) in his brother; Stroke in his brother. There is no history of Heart attack or Hypertension.  ROS:  Please see the history of present illness. All other systems are reviewed and otherwise negative.   PHYSICAL EXAM: VS:  BP 118/74 mmHg  Pulse 68  Ht  (1.727 m)  Wt 209 lb (94.802 kg)  BMI 31.79 kg/m2 BMI: Body mass index is 31.79 kg/(m^2). Well nourished, well developed, in no acute distress HEENT: normocephalic, atraumatic Neck: no JVD, carotid bruits or masses Cardiac:  normal S1, S2; RRR; no significant murmurs, no rubs, or gallops Lungs:  clear to auscultation bilaterally, no wheezing, rhonchi or rales Abd: soft, nontender MS: no deformity or atrophy Ext: no edema Skin: warm and dry, no rash Neuro:  No gross deficits appreciated Psych: euthymic mood, full affect  PPM site is stable, no tethering or discomfort   EKG:  Done today shows A pacing, V sensed PPM check today shows the episode of AFib noted at his remote, none further, 2 NSVT episodes, normal device function, see interrogation.  08/18/14 Echocardiogram Study Conclusions - Left ventricle: The cavity size was normal. Systolic function was normal. The estimated ejection fraction was in the range of  55% to 60%. Wall motion was normal; there were no regional wall motion abnormalities. Doppler parameters are consistent with abnormal left ventricular relaxation (grade 1 diastolic dysfunction). - Aortic valve: Transvalvular velocity was minimally increased. There was very mild stenosis. Mean gradient (S): 11 mm Hg. - Mitral valve: Calcified annulus. There was mild regurgitation. - Pulmonary arteries: Systolic pressure was mildly increased. PA peak pressure: 37 mm Hg (S). Impressions: - Compared to the prior study, there has been no significant interval change.  LA 40mm  Recent Labs: No results found for requested labs within last 365 days.  No results found for requested labs within last 365 days.   CrCl cannot be calculated (Patient has no serum creatinine result on file.).   Wt Readings from Last 3  Encounters:  10/14/15 209 lb (94.802 kg)  10/06/15 206 lb (93.441 kg)  12/08/14 209 lb 9.6 oz (95.074 kg)     Other studies reviewed: Additional studies/records reviewed today include: summarized above  DEVICE:  MDT PPM for Mobitz II/syncope, Dec 2011, Dr. [name]  ASSESSMENT AND PLAN:  1. Mobitz II with PPM     Continue Q 3 month Carelink transmissions  2. New PAFib     CHADS2Vasc is at least 4     He had labs done with his PMD on Monday, we have requested the results to review his CBC, renal function and TSH if done, unfortunately we were only able to leave a message and  the patient waited for some time though, but unable to wait longer and no results obtained yet.  We discussed the plan, once we have his labs to review and dose his Xarelto at we will notify him and send in the Rx. We will plan to stop Plavix since it > 1 year since his last coronary intervention, start Xarelto, if no TSH was done we will order one.  3 CAD    No CP    Last intervention was in 2014  4 HTN    Appears well controlled  Disposition: F/u with EP APP 1 month to f/u on  initiation of full anticoagulation.  Current medicines are reviewed at length with the patient today.  The patient did not have any concerns regarding medicines.  10/15/15: ADDENDUM: received the patient's labs dated 10/12/15 BUN/Creat 12/1.11, H/H 14/42, plts 177, LFTs, TSH 1.89 wnl (HDL 54, LDL 78, Trigs 105) Calc Creat. Cl. = 66.43 I spoke with the patient, We will start Xarelto  daily, he will stop Plavix, instructed to notify us if any bleeding or signs of bleeding, he stated understanding.  Francis Dowse, PA-C  Signed, Gregory Gilles, PA-C 10/14/2015 11:55 AM     [facility] [address] [place] [address] [phone] (office)  [phone] (fax)

## 2015-10-14 ENCOUNTER — Ambulatory Visit (INDEPENDENT_AMBULATORY_CARE_PROVIDER_SITE_OTHER): Payer: Medicare Other | Admitting: Physician Assistant

## 2015-10-14 ENCOUNTER — Encounter: Payer: Self-pay | Admitting: Physician Assistant

## 2015-10-14 ENCOUNTER — Encounter: Payer: Self-pay | Admitting: Internal Medicine

## 2015-10-14 VITALS — BP 118/74 | HR 68 | Ht 68.0 in | Wt 209.0 lb

## 2015-10-14 DIAGNOSIS — I10 Essential (primary) hypertension: Secondary | ICD-10-CM | POA: Diagnosis not present

## 2015-10-14 DIAGNOSIS — I441 Atrioventricular block, second degree: Secondary | ICD-10-CM | POA: Diagnosis not present

## 2015-10-14 DIAGNOSIS — I480 Paroxysmal atrial fibrillation: Secondary | ICD-10-CM

## 2015-10-14 DIAGNOSIS — I519 Heart disease, unspecified: Secondary | ICD-10-CM

## 2015-10-14 DIAGNOSIS — I472 Ventricular tachycardia: Secondary | ICD-10-CM | POA: Diagnosis not present

## 2015-10-14 NOTE — Patient Instructions (Addendum)
Medication Instructions:   CONTINUE ON THE SAME MEDICATIONS    If you need a refill on your cardiac medications before your next appointment, please call your pharmacy.  Labwork:  NONE ORDER TODAY    Testing/Procedures:  NONE ORDER TODAY    Follow-Up: IN 6 WEEKS   Any Other Special Instructions Will Be Listed Below (If Applicable).  ONCE LABS HAVE BEEN RECEIVED FROM PRIMARY CARE DOCTER YOU WILL BE CONTACTED ABOUT MEDICATIONS DOSAGE CHANGES

## 2015-10-15 ENCOUNTER — Telehealth: Payer: Self-pay | Admitting: Physician Assistant

## 2015-10-15 ENCOUNTER — Telehealth: Payer: Self-pay | Admitting: *Deleted

## 2015-10-15 ENCOUNTER — Other Ambulatory Visit: Payer: Self-pay | Admitting: *Deleted

## 2015-10-15 DIAGNOSIS — I4891 Unspecified atrial fibrillation: Secondary | ICD-10-CM

## 2015-10-15 MED ORDER — RIVAROXABAN 20 MG PO TABS: 20.0000 mg | ORAL_TABLET | Freq: Every day | ORAL | Status: DC

## 2015-10-15 NOTE — Telephone Encounter (Signed)
Error

## 2015-10-16 ENCOUNTER — Telehealth: Payer: Self-pay

## 2015-10-16 NOTE — Telephone Encounter (Signed)
Prior auth for Xarelto 20mg sent to [facility]. 

## 2015-10-19 ENCOUNTER — Telehealth: Payer: Self-pay

## 2015-10-19 LAB — CUP PACEART INCLINIC DEVICE CHECK
Brady Statistic AP VS Percent: 42.7 %
Brady Statistic AS VS Percent: 44 %
Date Time Interrogation Session: 20161219084957
Implantable Lead Implant Date: 20111206
Implantable Lead Location: 753860
Implantable Lead Model: 5092
Lead Channel Impedance Value: 742 Ohm
Lead Channel Pacing Threshold Amplitude: 0.875 V
Lead Channel Pacing Threshold Amplitude: 1 V
Lead Channel Sensing Intrinsic Amplitude: 1 mV
Lead Channel Sensing Intrinsic Amplitude: 16 mV
Lead Channel Setting Pacing Pulse Width: 0.4 ms
Lead Channel Setting Sensing Sensitivity: 5.6 mV
MDC IDC LEAD IMPLANT DT: 20111206
MDC IDC LEAD LOCATION: 753859
MDC IDC MSMT BATTERY VOLTAGE: 2.79 V
MDC IDC MSMT LEADCHNL RA IMPEDANCE VALUE: 426 Ohm
MDC IDC MSMT LEADCHNL RA PACING THRESHOLD PULSEWIDTH: 0.4 ms
MDC IDC MSMT LEADCHNL RV PACING THRESHOLD PULSEWIDTH: 0.4 ms
MDC IDC SET LEADCHNL RA PACING AMPLITUDE: 2 V
MDC IDC SET LEADCHNL RV PACING AMPLITUDE: 2.5 V
MDC IDC STAT BRADY AP VP PERCENT: 4.6 %
MDC IDC STAT BRADY AS VP PERCENT: 8.7 %

## 2015-10-19 NOTE — Telephone Encounter (Signed)
Xarelto 20mg  approved through 10/15/2016. PA # [id number].

## 2015-10-20 ENCOUNTER — Encounter: Payer: Self-pay | Admitting: Interventional Cardiology

## 2015-11-04 NOTE — Telephone Encounter (Signed)
CALLED PT TWICE IN REFERNCE TO COMING IN TO OFFICE FOR LABWORK PHONE PICKS UP BUT NO ONE TALKING WHEN SAYING HELLO WILL ATEMPT LATER

## 2015-11-04 NOTE — Telephone Encounter (Signed)
SPOKE TO WIFE ABOUT BEING UNABLE TO GET LAB RESULTS FROM PT..PMD..AND WOULD LIKE PT TO COME IN OFFFICE TO GET LABS COMPLETED SHE MENTIONED TO CONTACT ON CELL PT AT WORK

## 2015-11-05 ENCOUNTER — Ambulatory Visit (INDEPENDENT_AMBULATORY_CARE_PROVIDER_SITE_OTHER): Payer: Medicare Other | Admitting: Neurology

## 2015-11-05 DIAGNOSIS — R202 Paresthesia of skin: Secondary | ICD-10-CM | POA: Diagnosis not present

## 2015-11-05 DIAGNOSIS — G5603 Carpal tunnel syndrome, bilateral upper limbs: Secondary | ICD-10-CM

## 2015-11-05 NOTE — Procedures (Signed)
[facility]  [address] [place], [address]  [place], [place] [address] Tel: [phone] Fax:  [phone] Test Date:  11/05/2015  Patient: Gregory Mccormick DOB: [birth year] Physician: Nita Sickle, DO  Sex: Male Height: 5\' 9"  Ref Phys: Tally Joe, M.D.  ID#: [id number] Temp: 32.2C Technician: Judie Petit. Dean   Patient Complaints: This is an 82 year-old gentleman referred for evaluation of bilateral hand numbness and tingling.  NCV & EMG Findings: Extensive electrodiagnostic testing of right upper extremity and additional studies shows: 1. Bilateral median sensory response is absent. Bilateral ulnar sensory responses are within normal limits. 2. Bilateral median motor responses showed prolonged distal onset latency (L8.4, R7.2 ms).  Bilateral ulnar motor responses are within normal limits. 3. Chronic motor axon loss changes isolated to the abductor pollicis brevis muscles bilaterally, without accompanied active denervation.  Impression: Bilateral median neuropathy at or distal to the wrist, consistent with the clinical diagnosis of carpal tunnel syndrome. Overall, these findings are severe in degree electrically.    ___________________________ Nita Sickle, DO    Nerve Conduction Studies Anti Sensory Summary Table   Site NR Peak (ms) Norm Peak (ms) P-T Amp (V) Norm P-T Amp  Left Median Anti Sensory (2nd Digit)  32.2C  Wrist NR  <3.8  >10  Right Median Anti Sensory (2nd Digit)  32.2C  Wrist NR  <3.8  >10  Left Ulnar Anti Sensory (5th Digit)  32.2C  Wrist    3.2 <3.2 8.1 >5  Right Ulnar Anti Sensory (5th Digit)  32.2C  Wrist    3.1 <3.2 11.2 >5   Motor Summary Table   Site NR Onset (ms) Norm Onset (ms) O-P Amp (mV) Norm O-P Amp Site1 Site2 Delta-0 (ms) Dist (cm) Vel (m/s) Norm Vel (m/s)  Left Median Motor (Abd Poll Brev)  32.2C  Wrist    8.4 <4.0 5.6 >5 Elbow Wrist 5.5 29.0 53 >50  Elbow    13.9  4.8         Right Median Motor (Abd Poll Brev)  32.2C  Wrist    7.2  <4.0 5.3 >5 Elbow Wrist 5.7 29.0 51 >50  Elbow    12.9  4.7         Left Ulnar Motor (Abd Dig Minimi)  32.2C  Wrist    3.0 <3.1 7.5 >7 B Elbow Wrist 3.8 21.0 55 >50  B Elbow    6.8  7.2  A Elbow B Elbow 1.6 10.0 62 >50  A Elbow    8.4  6.9         Right Ulnar Motor (Abd Dig Minimi)  32.2C  Wrist    3.1 <3.1 8.2 >7 B Elbow Wrist 3.7 21.0 57 >50  B Elbow    6.8  7.7  A Elbow B Elbow 1.6 10.0 62 >50  A Elbow    8.4  7.4          EMG   Side Muscle Ins Act Fibs Psw Fasc Number Recrt Dur Dur. Amp Amp. Poly Poly. Comment  Right 1stDorInt Nml Nml Nml Nml Nml Nml Nml Nml Nml Nml Nml Nml N/A  Right Abd Poll Brev Nml Nml Nml Nml 1- Rapid Some 1+ Some 1+ Nml Nml N/A  Right Ext Indicis Nml Nml Nml Nml Nml Nml Nml Nml Nml Nml Nml Nml N/A  Right PronatorTeres Nml Nml Nml Nml Nml Nml Nml Nml Nml Nml Nml Nml N/A  Right Biceps Nml Nml Nml Nml Nml Nml Nml Nml Nml Nml  Nml Nml N/A  Right Triceps Nml Nml Nml Nml Nml Nml Nml Nml Nml Nml Nml Nml N/A  Right Deltoid Nml Nml Nml Nml Nml Nml Nml Nml Nml Nml Nml Nml N/A  Left 1stDorInt Nml Nml Nml Nml Nml Nml Nml Nml Nml Nml Nml Nml N/A  Left Abd Poll Brev Nml Nml Nml Nml 1- Rapid Some 1+ Some 1+ Nml Nml N/A  Left PronatorTeres Nml Nml Nml Nml Nml Nml Nml Nml Nml Nml Nml Nml N/A      Waveforms:

## 2015-11-09 ENCOUNTER — Other Ambulatory Visit: Payer: Self-pay | Admitting: Interventional Cardiology

## 2015-11-10 NOTE — Progress Notes (Signed)
Cardiology Office Note Date:  11/10/2015  Patient ID:  Gregory Mccormick, DOB Feb 25, 1929, MRN [id number] PCP:  Sissy Hoff, MD  Cardiologist:  Dr. Eldridge Dace Electrophysiologist: Dr. [name]   Chief Complaint:  F/u, new on Xarelto, started 10/14/15 for new onset PAFiib  History of Present Illness: Gregory Mccormick is a 82 y.o. male with history of CAD (last intervention 2014), OSA he reports intolerant of CPAP, obesity, HTN, HLD, hx of Mobtitz II heart block s/p PPM, ICM with improved EF. He feels very well, walks his dog 4x daily sometimes more walking 2-4 blocks without changes in his exertional capacity, no CP SOB, no palpitations, no dizziness, near syncope or syncope.  He mentions that after 4 days he stopped the xarelto noting when he blew his nose there was a couple specks of blood on the tissue, and once when he woke, he noted some blood on his eyelid, and small spot that looked like it has bled over night.  He explains that he has a habit of rubbing his eyes, with some small skin lesions/warts some of which he has had removed historically, and thinks he rubbed his eye and irritated one of these.  He had no other bleeding, no sustained bleeding of any kind or bleeding that he had to manage at all.  He has not noted any change in his stool.  PMHX mentions anemia, blood in stool and hemorrhoids that he reports was many years ago, none in >5 years or so. CHADS2Vasc is at least 4, Rx Xarelto  Past Medical History  Diagnosis Date  . Obesity   . Vitamin D deficiency   . History of multiple pulmonary nodules   . Low testosterone   . ED (erectile dysfunction)   . Osteopenia   . OSA on CPAP     does not use cpap, does not like  . PONV (postoperative nausea and vomiting)   . Nonspecific abnormal unspecified cardiovascular function study   . Other primary cardiomyopathies   . Paroxysmal ventricular tachycardia (HCC)   . Pacemaker     MDT  . H/O hiatal hernia   . GERD (gastroesophageal  reflux disease)   . Arthritis     "right knee" (03/28/2013  . Exertional shortness of breath     "recently" (03/28/2013)  . Essential hypertension, benign   . Other testicular hypofunction   . Anemia, unspecified   . Internal hemorrhoids without mention of complication   . Blood in stool   . CAD in native artery   . Hyperlipidemia   . Anxiety state, unspecified   . Obesity   . Cardiomyopathy (HCC)     EF 40%    Past Surgical History  Procedure Laterality Date  . Total knee arthroplasty Right 2007  . Knee cartilage surgery Left 1972    "cut it open" (03/28/2013)  . Shoulder surgery Right 1970's    "pulled muscle apart; sewed it back together" (03/28/2013  . Total knee arthroplasty  11/26/2012    Procedure: TOTAL KNEE ARTHROPLASTY;  Surgeon: Loanne Drilling, MD;  Location: [facility];  Service: Orthopedics;  Laterality: Left;  . Cardiac catheterization  03/21/2013  . Coronary angioplasty with stent placement  03/28/2013    "3 stents" (03/28/2013)  . Knee cartilage surgery Right ~ 1977    "cut it open" (03/28/2013)  . Shoulder hemi-arthroplasty Right 1987    "got hit by sled & dragged down sheet > 200 feet; tore shoulder up" (03/28/2013)  . Cataract extraction w/  intraocular lens  implant, bilateral Bilateral 2000's  . Pacemaker insertion  10/05/10    MDT Adapta L implanted by Dr [name] for mobitz II second degree AV block  . Percutaneous coronary rotoblator intervention (pci-r) N/A 03/28/2013    Procedure: PERCUTANEOUS CORONARY ROTOBLATOR INTERVENTION (PCI-R);  Surgeon: Corky Crafts, MD;  Location: [facility] CATH LAB;  Service: Cardiovascular;  Laterality: N/A;    Current Outpatient Prescriptions  Medication Sig Dispense Refill  . acetaminophen (TYLENOL) 500 MG tablet Take 1,000 mg by mouth every 6 (six) hours as needed. For leg pain.    Marland Kitchen atorvastatin (LIPITOR) 10 MG tablet TAKE 1 TABLET (10 MG TOTAL) BY MOUTH DAILY AT 6 PM. 30 tablet 10  . cholecalciferol (VITAMIN D) 1000 UNITS tablet  Take 1,000 Units by mouth 2 (two) times daily.    . clopidogrel (PLAVIX) 75 MG tablet Take 75 mg by mouth daily.    . fish oil-omega-3 fatty acids 1000 MG capsule Take 1 g by mouth daily.    Marland Kitchen FLUZONE HIGH-DOSE 0.5 ML SUSY   0  . furosemide (LASIX) 40 MG tablet TAKE 1 TABLET (40 MG TOTAL) BY MOUTH DAILY. 30 tablet 9  . metoprolol tartrate (LOPRESSOR) 25 MG tablet TAKE 1 TABLET (25 MG TOTAL) BY MOUTH 2 (TWO) TIMES DAILY. 60 tablet 11  . rivaroxaban (XARELTO) 20 MG TABS tablet Take 1 tablet (20 mg total) by mouth daily with supper. 30 tablet 6  . testosterone (TESTIM) 50 MG/5GM GEL Place 5 g onto the skin every morning.     No current facility-administered medications for this visit.    Allergies:   Hydromorphone hcl and Penicillins   Social History:  The patient  reports that he quit smoking about 42 years ago. His smoking use included Cigarettes. He has a 26 pack-year smoking history. He has never used smokeless tobacco. He reports that he does not drink alcohol or use illicit drugs.   Family History:  The patient's family history includes CAD (age of onset: 57) in his brother; Stroke in his brother. There is no history of Heart attack or Hypertension.  ROS:  Please see the history of present illness. All other systems are reviewed and otherwise negative.   PHYSICAL EXAM: VS:  There were no vitals taken for this visit. BMI: There is no weight on file to calculate BMI. Well nourished, well developed, in no acute distress HEENT: normocephalic, atraumatic Neck: no JVD, carotid bruits or masses Cardiac:  normal S1, S2; RRR; no significant murmurs, no rubs, or gallops Lungs:  clear to auscultation bilaterally, no wheezing, rhonchi or rales Abd: soft, nontender MS: no deformity or atrophy Ext: no edema Skin: warm and dry, no rash Neuro:  No gross deficits appreciated Psych: euthymic mood, full affect  PPM site is stable, no tethering or discomfort   EKG:  Done 10/14/15 shows A pacing,  V sensed PPM check today shows normal function one 6 beat NSVT episode today only (with nor reported symptom) PPM check 10/14/15 shows the episode of AFib noted at his remote, none further, 2 NSVT episodes, normal device function, see interrogation.  08/18/14 Echocardiogram Study Conclusions - Left ventricle: The cavity size was normal. Systolic function was normal. The estimated ejection fraction was in the range of 55% to 60%. Wall motion was normal; there were no regional wall motion abnormalities. Doppler parameters are consistent with abnormal left ventricular relaxation (grade 1 diastolic dysfunction). - Aortic valve: Transvalvular velocity was minimally increased. There was very mild stenosis. Mean  gradient (S): 11 mm Hg. - Mitral valve: Calcified annulus. There was mild regurgitation. - Pulmonary arteries: Systolic pressure was mildly increased. PA peak pressure: 37 mm Hg (S). Impressions: - Compared to the prior study, there has been no significant interval change.  LA 47mm  Recent Labs: 10/12/15 BUN/Creat 12/1.11, H/H 14/42, plts 177, LFTs, TSH 1.89 wnl (HDL 54, LDL 78, Trigs 105) Calc Creat. Cl. = 66.43  Wt Readings from Last 3 Encounters:  10/14/15 209 lb (94.802 kg)  10/06/15 206 lb (93.441 kg)  12/08/14 209 lb 9.6 oz (95.074 kg)     Other studies reviewed: Additional studies/records reviewed today include: summarized above  DEVICE:  MDT PPM for Mobitz II/syncope, Dec 2011, Dr. [name]  ASSESSMENT AND PLAN:  1. Mobitz II with PPM     Continue Q 3 month [facility]link transmissions  2. New PAFib     CHADS2Vasc is at least 4     Xarelto     No significant episodes of bleeding reported, we re-discussed his embolic/stroke risk, and his observed blood on tissue after blowing his nose, and likely scratch on his eye lid, and would like him to resume the xarelto.  He is agreeable, he is asked to try not to rub his eyes like he does, and monitor for any  recurrence or other bleeding and to notify us if any  3 CAD    No CP    Last intervention was in 2014    On BB, statin, plavix discontinued with the addition of Xarelto  4 HTN    Appears well controlled  5. NSVT x1 of 6 beats     Asymptomatic     He has hx of CM that was better at his last echo last year, will update his echo  Disposition: F/u with EP APP 2-3 month sooner if needed.  Current medicines are reviewed at length with the patient today.  The patient did not have any concerns regarding medicines.    Judith Blonder, PA-C 11/10/2015 9:19 AM     [facility] [address] [place] [address] [phone] (office)  [phone] (fax)

## 2015-11-11 ENCOUNTER — Ambulatory Visit (INDEPENDENT_AMBULATORY_CARE_PROVIDER_SITE_OTHER): Payer: Medicare Other | Admitting: Physician Assistant

## 2015-11-11 ENCOUNTER — Encounter: Payer: Self-pay | Admitting: Physician Assistant

## 2015-11-11 VITALS — BP 128/78 | HR 60 | Ht 68.0 in | Wt 204.0 lb

## 2015-11-11 DIAGNOSIS — I429 Cardiomyopathy, unspecified: Secondary | ICD-10-CM | POA: Diagnosis not present

## 2015-11-11 DIAGNOSIS — I2510 Atherosclerotic heart disease of native coronary artery without angina pectoris: Secondary | ICD-10-CM | POA: Diagnosis not present

## 2015-11-11 DIAGNOSIS — I480 Paroxysmal atrial fibrillation: Secondary | ICD-10-CM | POA: Diagnosis not present

## 2015-11-11 NOTE — Patient Instructions (Addendum)
Medication Instructions:   CONTINUE SAME MEDICATIONS    If you need a refill on your cardiac medications before your next appointment, please call your pharmacy.  Labwork: NONE ORDER TODAY    Testing/Procedures:Your physician has requested that you have an echocardiogram. Echocardiography is a painless test that uses sound waves to create images of your heart. It provides your doctor with information about the size and shape of your heart and how well your heart's chambers and valves are working. This procedure takes approximately one hour. There are no restrictions for this procedure.     Follow-Up:  WITH RENEE IN 2 TO 3 MONTHS    Any Other Special Instructions Will Be Listed Below (If Applicable).

## 2015-11-13 ENCOUNTER — Encounter: Payer: Self-pay | Admitting: Internal Medicine

## 2015-11-17 ENCOUNTER — Encounter: Payer: Medicare Other | Admitting: Neurology

## 2015-11-25 ENCOUNTER — Other Ambulatory Visit: Payer: Self-pay

## 2015-11-25 ENCOUNTER — Ambulatory Visit (HOSPITAL_COMMUNITY): Payer: Medicare Other | Attending: Internal Medicine

## 2015-11-25 DIAGNOSIS — I340 Nonrheumatic mitral (valve) insufficiency: Secondary | ICD-10-CM | POA: Insufficient documentation

## 2015-11-25 DIAGNOSIS — R079 Chest pain, unspecified: Secondary | ICD-10-CM | POA: Diagnosis present

## 2015-11-25 DIAGNOSIS — I10 Essential (primary) hypertension: Secondary | ICD-10-CM

## 2015-11-25 DIAGNOSIS — I350 Nonrheumatic aortic (valve) stenosis: Secondary | ICD-10-CM | POA: Diagnosis not present

## 2015-11-25 DIAGNOSIS — I071 Rheumatic tricuspid insufficiency: Secondary | ICD-10-CM | POA: Insufficient documentation

## 2015-11-25 DIAGNOSIS — I517 Cardiomegaly: Secondary | ICD-10-CM | POA: Diagnosis not present

## 2015-11-25 DIAGNOSIS — I429 Cardiomyopathy, unspecified: Secondary | ICD-10-CM | POA: Diagnosis not present

## 2015-11-27 ENCOUNTER — Telehealth: Payer: Self-pay | Admitting: *Deleted

## 2015-11-27 NOTE — Telephone Encounter (Signed)
-----   Message from [facility], [place] sent at 11/26/2015  4:10 PM EST ----- Please let the patient know his echo looked good, good heart muscle function.   Inquire how he is doing with the Xarelto (new for him).  If any bleeding or concerns.  Thanks renee

## 2015-12-10 ENCOUNTER — Encounter: Payer: Medicare Other | Admitting: Internal Medicine

## 2015-12-16 ENCOUNTER — Telehealth: Payer: Self-pay | Admitting: *Deleted

## 2015-12-16 NOTE — Telephone Encounter (Signed)
OK to stop plavix at this time.

## 2015-12-16 NOTE — Telephone Encounter (Signed)
PT and wife are aware of Dr Hoyle Barr recommendations to stop the Plavix 75 mg daily. Pt and wife verbalized understanding.  Plavix was D/C from pt's medication list.

## 2015-12-16 NOTE — Telephone Encounter (Signed)
Spoke with Gregory Moloney  PA he is aware of pt's symptoms. PA recommends to Instruct pt see his PCP, potentially ENT visit. Pt needs to pack nose if nose bleeding. Use OTC afrin spray to stop bleeding. Check with Dr. Eldridge Dace to see if pt can stop Plavix.75 mg Pt's last stent was 02/2013. Pt is aware of PA's recommendations. Pt and wife are aware that I will send this message to Dr. Eldridge Dace to see if pt can stop Plavix 75 mg medication.

## 2015-12-16 NOTE — Telephone Encounter (Signed)
Pt's wife called , because pt couldn't call because he was driving. Pt's wife states that pt is on Xarelto 20 mg once  Day and Plavix 75 mg once a day. Pt has had small amount on nose bleeding since starting taken blood thinners. 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 When  he was able to stop pt  went  in a pharmacy to get cotton balls. Pt was able to  packed his nose, then the bleeding stop. Wife states that this was the longest time pt has  bleed since he started that Xarelto. Pt would like to know what to do because he cant continue doing this.

## 2015-12-30 ENCOUNTER — Other Ambulatory Visit (INDEPENDENT_AMBULATORY_CARE_PROVIDER_SITE_OTHER): Payer: Medicare Other | Admitting: *Deleted

## 2015-12-30 ENCOUNTER — Telehealth: Payer: Self-pay | Admitting: Internal Medicine

## 2015-12-30 DIAGNOSIS — I10 Essential (primary) hypertension: Secondary | ICD-10-CM | POA: Diagnosis not present

## 2015-12-30 DIAGNOSIS — Z79899 Other long term (current) drug therapy: Secondary | ICD-10-CM

## 2015-12-30 LAB — CBC WITH DIFFERENTIAL/PLATELET
BASOS PCT: 0 % (ref 0–1)
Basophils Absolute: 0 10*3/uL (ref 0.0–0.1)
EOS ABS: 0.2 10*3/uL (ref 0.0–0.7)
EOS PCT: 3 % (ref 0–5)
HCT: 39.1 % (ref 39.0–52.0)
Hemoglobin: 12.8 g/dL — ABNORMAL LOW (ref 13.0–17.0)
Lymphocytes Relative: 25 % (ref 12–46)
Lymphs Abs: 1.3 10*3/uL (ref 0.7–4.0)
MCH: 30.5 pg (ref 26.0–34.0)
MCHC: 32.7 g/dL (ref 30.0–36.0)
MCV: 93.1 fL (ref 78.0–100.0)
MONO ABS: 0.5 10*3/uL (ref 0.1–1.0)
MPV: 10.4 fL (ref 8.6–12.4)
Monocytes Relative: 10 % (ref 3–12)
Neutro Abs: 3.2 10*3/uL (ref 1.7–7.7)
Neutrophils Relative %: 62 % (ref 43–77)
PLATELETS: 208 10*3/uL (ref 150–400)
RBC: 4.2 MIL/uL — AB (ref 4.22–5.81)
RDW: 13.8 % (ref 11.5–15.5)
WBC: 5.1 10*3/uL (ref 4.0–10.5)

## 2015-12-30 NOTE — Addendum Note (Signed)
Addended by: Tonita Phoenix on: 12/30/2015 02:59 PM   Modules accepted: Orders

## 2015-12-30 NOTE — Telephone Encounter (Signed)
Wife calling stating Mr. Mcclusky has multiple nose bleeds over the past few weeks maybe a month.  States he was at Dr. Allene Pyo office (ENT) 3 times last week with nose bleeds and he cauterized the areas.  Last night he bled "alot" for 2-3 hrs before they could get it stopped.  States he is seeing Dr. Ezzard Standing now to get area cauterized again. Has not had any lab work since 10/15/15 at Dr. Merita Norton office to check his CBC.  She states it is "copious" amounts.  Advised Dr. Eldridge Dace is in the hospital so will forward to our pharmacist for recommendations.  Will call her back with their suggestions.

## 2015-12-30 NOTE — Telephone Encounter (Signed)
I agree with these labs.

## 2015-12-30 NOTE — Telephone Encounter (Signed)
Pt could have CBC and BMET checked since he is on Xarelto. Labwork from December was stable with Hgb 14.2 and CrCl  > 60 - at that time, he was on the appropriate dose of Xarelto 20mg  daily. Would route to Dr. Eldridge Dace as well for any other recommendations.

## 2015-12-30 NOTE — Telephone Encounter (Signed)
Per Margaretmary Dys, pharm should have CBC and BMET checked since on Xarelto.  Wife is agreeable and will come in today to have labs. Will also forward to Dr.Varanasi for recommendations.

## 2015-12-30 NOTE — Telephone Encounter (Signed)
New Message   Pt c/o medication issue: 1. Name of Medication: Not sure of the name of the medication... Because she is not sure if it is the medication  4. What is your medication issue? Pt wife called states that the pt has had numerous nose bleeds. Please call back to discuss.

## 2015-12-31 ENCOUNTER — Emergency Department (HOSPITAL_COMMUNITY)
Admission: EM | Admit: 2015-12-31 | Discharge: 2015-12-31 | Disposition: A | Discharge status: Home / Self Care | Payer: Medicare Other | Attending: Emergency Medicine | Admitting: Emergency Medicine

## 2015-12-31 ENCOUNTER — Telehealth: Payer: Self-pay | Admitting: Interventional Cardiology

## 2015-12-31 ENCOUNTER — Encounter (HOSPITAL_COMMUNITY): Payer: Self-pay | Admitting: *Deleted

## 2015-12-31 DIAGNOSIS — K219 Gastro-esophageal reflux disease without esophagitis: Secondary | ICD-10-CM | POA: Insufficient documentation

## 2015-12-31 DIAGNOSIS — Z862 Personal history of diseases of the blood and blood-forming organs and certain disorders involving the immune mechanism: Secondary | ICD-10-CM | POA: Insufficient documentation

## 2015-12-31 DIAGNOSIS — Z950 Presence of cardiac pacemaker: Secondary | ICD-10-CM | POA: Insufficient documentation

## 2015-12-31 DIAGNOSIS — Z880 Allergy status to penicillin: Secondary | ICD-10-CM | POA: Insufficient documentation

## 2015-12-31 DIAGNOSIS — M1990 Unspecified osteoarthritis, unspecified site: Secondary | ICD-10-CM | POA: Diagnosis not present

## 2015-12-31 DIAGNOSIS — F419 Anxiety disorder, unspecified: Secondary | ICD-10-CM | POA: Insufficient documentation

## 2015-12-31 DIAGNOSIS — I10 Essential (primary) hypertension: Secondary | ICD-10-CM | POA: Diagnosis not present

## 2015-12-31 DIAGNOSIS — Z87891 Personal history of nicotine dependence: Secondary | ICD-10-CM | POA: Insufficient documentation

## 2015-12-31 DIAGNOSIS — Z79899 Other long term (current) drug therapy: Secondary | ICD-10-CM | POA: Diagnosis not present

## 2015-12-31 DIAGNOSIS — E559 Vitamin D deficiency, unspecified: Secondary | ICD-10-CM | POA: Insufficient documentation

## 2015-12-31 DIAGNOSIS — R040 Epistaxis: Secondary | ICD-10-CM | POA: Diagnosis present

## 2015-12-31 DIAGNOSIS — E669 Obesity, unspecified: Secondary | ICD-10-CM | POA: Insufficient documentation

## 2015-12-31 LAB — BASIC METABOLIC PANEL
BUN: 14 mg/dL (ref 7–25)
CO2: 28 mmol/L (ref 20–31)
Calcium: 8.7 mg/dL (ref 8.6–10.3)
Chloride: 103 mmol/L (ref 98–110)
Creat: 1.1 mg/dL (ref 0.70–1.11)
Glucose, Bld: 98 mg/dL (ref 65–99)
POTASSIUM: 3.6 mmol/L (ref 3.5–5.3)
SODIUM: 141 mmol/L (ref 135–146)

## 2015-12-31 LAB — CBC WITH DIFFERENTIAL/PLATELET
BASOS PCT: 0 %
Basophils Absolute: 0 10*3/uL (ref 0.0–0.1)
Eosinophils Absolute: 0.1 10*3/uL (ref 0.0–0.7)
Eosinophils Relative: 2 %
HCT: 39.5 % (ref 39.0–52.0)
Hemoglobin: 13.2 g/dL (ref 13.0–17.0)
LYMPHS ABS: 1.1 10*3/uL (ref 0.7–4.0)
LYMPHS PCT: 15 %
MCH: 32 pg (ref 26.0–34.0)
MCHC: 33.4 g/dL (ref 30.0–36.0)
MCV: 95.6 fL (ref 78.0–100.0)
MONO ABS: 0.6 10*3/uL (ref 0.1–1.0)
Monocytes Relative: 8 %
NEUTROS ABS: 5.4 10*3/uL (ref 1.7–7.7)
NEUTROS PCT: 75 %
PLATELETS: 192 10*3/uL (ref 150–400)
RBC: 4.13 MIL/uL — ABNORMAL LOW (ref 4.22–5.81)
RDW: 13.2 % (ref 11.5–15.5)
WBC: 7.2 10*3/uL (ref 4.0–10.5)

## 2015-12-31 MED ORDER — CEPHALEXIN 500 MG PO CAPS: 500.0000 mg | ORAL_CAPSULE | Freq: Four times a day (QID) | ORAL | Status: DC

## 2015-12-31 NOTE — ED Notes (Addendum)
Pt to ED by EMs from home c/o epistaxis since 0230. Pt was on Xarleto but taken off last week. Pt had cauterization on Wednesday morning by Dr.Newman with two previous catherizations. EMS gave Afrin with improvement, bleeding at minimal from right nostril at this time. Pt spitting up blood.

## 2015-12-31 NOTE — Telephone Encounter (Signed)
Per Ronita Hipps, PA the pts wife is advised that the pt should continue to take Xarelto as directed to prevent strokes and to contact the pts ENT as advised per ER d/c from this morning.  She verbalized understanding.

## 2015-12-31 NOTE — Telephone Encounter (Signed)
**Note De-Identified Adore Kithcart Obfuscation** The pts wife reports that the pt was at the ER again overnight due to nose bleed that the pt nor EMS could control when they were called.  The pts wife states that the pt cannot keep dealing with these nose bleeds and wants to know if he can stop taking Xarelto or change to another medications that will not cause nose bleeds.  She is advised that I will discuss with oyr DOD and cal her back. She verbalized understanding.

## 2015-12-31 NOTE — ED Provider Notes (Signed)
CSN: [id number]     Arrival date & time 12/31/15  0351 History   First MD Initiated Contact with Patient 12/31/15 0413     Chief Complaint  Patient presents with  . Epistaxis     (Consider location/radiation/quality/duration/timing/severity/associated sxs/prior Treatment) HPI  This is an 82 year old male with a history of hurts a small atrial fibrillation on Xarelto, coronary artery disease, hyperlipidemia who presents with epistaxis.  The patient reports that he has presented multiple times to the ENT office and required cautery of his right nasal mucosa. He started bleeding again this morning and 2:30. It is out of his right knee are. He continues to stay on Xarelto. Dr. Ezzard Standing is his ENT doctor. He was given afrin by EMS but continues to have some bleeding.  Patient is also spitting out blood.  Past Medical History  Diagnosis Date  . Obesity   . Vitamin D deficiency   . History of multiple pulmonary nodules   . Low testosterone   . ED (erectile dysfunction)   . Osteopenia   . OSA on CPAP     does not use cpap, does not like  . PONV (postoperative nausea and vomiting)   . Nonspecific abnormal unspecified cardiovascular function study   . Other primary cardiomyopathies   . Paroxysmal ventricular tachycardia (HCC)   . Pacemaker     MDT  . H/O hiatal hernia   . GERD (gastroesophageal reflux disease)   . Arthritis     "right knee" (03/28/2013  . Exertional shortness of breath     "recently" (03/28/2013)  . Essential hypertension, benign   . Other testicular hypofunction   . Anemia, unspecified   . Internal hemorrhoids without mention of complication   . Blood in stool   . CAD in native artery   . Hyperlipidemia   . Anxiety state, unspecified   . Obesity   . Cardiomyopathy (HCC)     EF 40%   Past Surgical History  Procedure Laterality Date  . Total knee arthroplasty Right 2007  . Knee cartilage surgery Left 1972    "cut it open" (03/28/2013)  . Shoulder surgery Right  1970's    "pulled muscle apart; sewed it back together" (03/28/2013  . Total knee arthroplasty  11/26/2012    Procedure: TOTAL KNEE ARTHROPLASTY;  Surgeon: Loanne Drilling, MD;  Location: [facility];  Service: Orthopedics;  Laterality: Left;  . Cardiac catheterization  03/21/2013  . Coronary angioplasty with stent placement  03/28/2013    "3 stents" (03/28/2013)  . Knee cartilage surgery Right ~ 1977    "cut it open" (03/28/2013)  . Shoulder hemi-arthroplasty Right 1987    "got hit by sled & dragged down sheet > 200 feet; tore shoulder up" (03/28/2013)  . Cataract extraction w/ intraocular lens  implant, bilateral Bilateral 2000's  . Pacemaker insertion  10/05/10    MDT Adapta L implanted by Dr [name] for mobitz II second degree AV block  . Percutaneous coronary rotoblator intervention (pci-r) N/A 03/28/2013    Procedure: PERCUTANEOUS CORONARY ROTOBLATOR INTERVENTION (PCI-R);  Surgeon: Corky Crafts, MD;  Location: [facility] CATH LAB;  Service: Cardiovascular;  Laterality: N/A;   Family History  Problem Relation Age of Onset  . Cancer    . Heart disease    . CAD Brother 80  . Heart attack Neg Hx   . Hypertension Neg Hx   . Stroke Brother    Social History  Substance Use Topics  . Smoking status: Former Smoker --  1.00 packs/day for 26 years    Types: Cigarettes    Quit date: 10/31/1973  . Smokeless tobacco: Never Used  . Alcohol Use: No    Review of Systems  HENT: Positive for nosebleeds.   Respiratory: Negative for shortness of breath.   Cardiovascular: Negative for chest pain.  Gastrointestinal: Negative for nausea.      Allergies  Hydromorphone hcl and Penicillins  Home Medications   Prior to Admission medications   Medication Sig Start Date End Date Taking? Authorizing Provider  acetaminophen (TYLENOL) 500 MG tablet Take 1,000 mg by mouth every 6 (six) hours as needed. For leg pain.   Yes Historical Provider, MD  atorvastatin (LIPITOR) 10 MG tablet TAKE 1 TABLET (10 MG  TOTAL) BY MOUTH DAILY AT 6 PM. 11/09/15  Yes Corky Crafts, MD  cholecalciferol (VITAMIN D) 1000 UNITS tablet Take 5,000 Units by mouth daily.    Yes Historical Provider, MD  furosemide (LASIX) 40 MG tablet TAKE 1 TABLET (40 MG TOTAL) BY MOUTH DAILY. 01/29/15  Yes Corky Crafts, MD  metoprolol tartrate (LOPRESSOR) 25 MG tablet TAKE 1 TABLET (25 MG TOTAL) BY MOUTH 2 (TWO) TIMES DAILY. 10/12/15  Yes Corky Crafts, MD  Misc Natural Products (OSTEO BI-FLEX ADV DOUBLE ST PO) Take 1 tablet by mouth daily.   Yes Historical Provider, MD  rivaroxaban (XARELTO) 20 MG TABS tablet Take 1 tablet (20 mg total) by mouth daily with supper. 10/15/15  Yes Renee Norberto Sorenson, PA-C  cephALEXin (KEFLEX) 500 MG capsule Take 1 capsule (500 mg total) by mouth 4 (four) times daily. 12/31/15   Shon Baton, MD   BP 176/101 mmHg  Pulse 70  Temp(Src) 98.3 F (36.8 C)  Resp 18  SpO2 95% Physical Exam  Constitutional: He is oriented to person, place, and time. No distress.  HENT:  Head: Normocephalic and atraumatic.  Active bleeding noted from the right nare, unable to visualize site as Afrin does not seem to control bleeding long enough to examine nasal mucosa, patient with bleeding down the posterior oropharynx  Eyes: Pupils are equal, round, and reactive to light.  Cardiovascular: Normal rate, regular rhythm and normal heart sounds.   Pulmonary/Chest: Effort normal. No respiratory distress.  Neurological: He is alert and oriented to person, place, and time.  Skin: Skin is warm and dry.  Psychiatric: He has a normal mood and affect.  Nursing note and vitals reviewed.   ED Course  .Epistaxis Management Date/Time: 12/31/2015 6:09 AM Performed by: Shon Baton Authorized by: Shon Baton Consent: Verbal consent obtained. Risks and benefits: risks, benefits and alternatives were discussed Consent given by: patient Local anesthetic: topical anesthetic Patient sedated: no Treatment  site: right anterior Repair method: merocel sponge Post-procedure assessment: bleeding stopped Treatment complexity: simple Recurrence: recurrence of recent bleed Patient tolerance: Patient tolerated the procedure well with no immediate complications   (including critical care time) Labs Review Labs Reviewed  CBC WITH DIFFERENTIAL/PLATELET - Abnormal; Notable for the following:    RBC 4.13 (*)    All other components within normal limits    Imaging Review No results found. I have personally reviewed and evaluated these images and lab results as part of my medical decision-making.   EKG Interpretation None      MDM   Final diagnoses:  Epistaxis, recurrent    Patient presents with recurrent epistaxis. He has had multiple cauterizations. He continues to take an anticoagulant. He has active bleeding on exam. It appears to  be an anterior bleed. I had the patient blow out the clot and Afrin was applied; however, he continued to bleed and I was unable to visualize the nasal mucosa. For this reason, packing was placed. Bleeding controlled with packing. Hemoglobin is stable. We'll have him follow back up with Dr. Ezzard Standing. Will place on antibiotics given packing.  After history, exam, and medical workup I feel the patient has been appropriately medically screened and is safe for discharge home. Pertinent diagnoses were discussed with the patient. Patient was given return precautions.     Shon Baton, MD 12/31/15 [phone]

## 2015-12-31 NOTE — Telephone Encounter (Signed)
New message      Pt was in the ER all night with a bad nose bleed.  He is on xarelto.  He was instructed to call Dr Eldridge Dace this am for further instructions.  Please call

## 2015-12-31 NOTE — ED Notes (Signed)
Bleeding appears controlled with rhino rocket in R nostril

## 2015-12-31 NOTE — Telephone Encounter (Signed)
We can try stopping Xarelto for 2 days.  At that point, we can make a decision to go back on Plavix or Xarelto.  Xarelto is better at preventing strokes than plavix, but if the nosebleeds are too much and requiring multiple ER visits, then we will have to stop it.  Now we could consider him for Watchman device if nothing else could be done by ENT.  Will cc: Dr. Excell Seltzer and Dr. [name].

## 2015-12-31 NOTE — Discharge Instructions (Signed)

## 2016-01-01 NOTE — Telephone Encounter (Signed)
Will forward to [employer] to put on [employer]. Would be happy to see him after ENT evaluation.

## 2016-01-04 NOTE — Telephone Encounter (Signed)
The pts wife stated on Friday 3/3 that the pt was going to see his ENT that day to remove packing from his nose.

## 2016-01-07 ENCOUNTER — Telehealth: Payer: Self-pay

## 2016-01-07 NOTE — Telephone Encounter (Signed)
The pt is scheduled to have right wrist L.O. CTR on 02/05/16 with Dr Amanda Pea at [facility] and needs pre-operative clearance.  FYI: the pt is taking Xarelto 20 mg daily.  Please advise.

## 2016-01-11 NOTE — Telephone Encounter (Signed)
No further cardiac testing needed before his procedure.  WOuld check with PharmD as to when to stop Xarelto.

## 2016-01-11 NOTE — Telephone Encounter (Signed)
**Note De-Identified Sy Saintjean Obfuscation** Message has been faxed through the [facility] faxing system.

## 2016-01-12 ENCOUNTER — Telehealth: Payer: Self-pay | Admitting: Interventional Cardiology

## 2016-01-12 NOTE — Telephone Encounter (Signed)
Walk in pt form-[facility] paper dropped off gave to [employer]

## 2016-01-14 ENCOUNTER — Encounter: Payer: Self-pay | Admitting: Internal Medicine

## 2016-01-18 ENCOUNTER — Telehealth: Payer: Self-pay | Admitting: Interventional Cardiology

## 2016-01-18 NOTE — Telephone Encounter (Signed)
**Note De-Identified Tasha Jindra Obfuscation** Please advise on when the pt should stop taking his Xarelto prior to right wrist L.O. CTR. Thanks.

## 2016-01-19 NOTE — Telephone Encounter (Signed)
Pt has a CHADS score of 2.  No history of stroke or TIA. Okay to hold Xarelto x 48 hours prior to procedure.  Will fax to [facility].

## 2016-01-25 ENCOUNTER — Encounter: Payer: Self-pay | Admitting: Internal Medicine

## 2016-01-25 ENCOUNTER — Ambulatory Visit (INDEPENDENT_AMBULATORY_CARE_PROVIDER_SITE_OTHER): Payer: Medicare Other | Admitting: Internal Medicine

## 2016-01-25 VITALS — BP 132/72 | HR 84 | Ht 68.0 in | Wt 210.2 lb

## 2016-01-25 DIAGNOSIS — I10 Essential (primary) hypertension: Secondary | ICD-10-CM

## 2016-01-25 DIAGNOSIS — I480 Paroxysmal atrial fibrillation: Secondary | ICD-10-CM | POA: Diagnosis not present

## 2016-01-25 DIAGNOSIS — I441 Atrioventricular block, second degree: Secondary | ICD-10-CM | POA: Diagnosis not present

## 2016-01-25 DIAGNOSIS — I4891 Unspecified atrial fibrillation: Secondary | ICD-10-CM | POA: Insufficient documentation

## 2016-01-25 NOTE — Progress Notes (Signed)
Electrophysiology Office Note   Date:  01/25/2016   ID:  TANVIR HIPPLE, DOB 11-Jan-1929, MRN [id number]  PCP:  Sissy Hoff, MD  Cardiologist:  Dr Eldridge Dace Primary Electrophysiologist: Hillis Range, MD    Chief Complaint  Patient presents with  . Pacemaker Check     History of Present Illness: Gregory Mccormick is a 82 y.o. male who presents today for electrophysiology evaluation.   He has done very well since his last visit.  He remains active for his age.  He has recent epistaxis which is currently improved. Today, he denies symptoms of palpitations, chest pain, shortness of breath, orthopnea, PND, lower extremity edema, claudication, dizziness, presyncope, syncope, bleeding, or neurologic sequela. The patient is tolerating medications without difficulties and is otherwise without complaint today.    Past Medical History  Diagnosis Date  . Obesity   . Vitamin D deficiency   . History of multiple pulmonary nodules   . Low testosterone   . ED (erectile dysfunction)   . Osteopenia   . OSA on CPAP     does not use cpap, does not like  . PONV (postoperative nausea and vomiting)   . Nonspecific abnormal unspecified cardiovascular function study   . Other primary cardiomyopathies   . Paroxysmal ventricular tachycardia (HCC)   . Pacemaker     MDT  . H/O hiatal hernia   . GERD (gastroesophageal reflux disease)   . Arthritis     "right knee" (03/28/2013  . Exertional shortness of breath     "recently" (03/28/2013)  . Essential hypertension, benign   . Other testicular hypofunction   . Anemia, unspecified   . Internal hemorrhoids without mention of complication   . Blood in stool   . CAD in native artery   . Hyperlipidemia   . Anxiety state, unspecified   . Obesity   . Cardiomyopathy (HCC)     EF 40%   Past Surgical History  Procedure Laterality Date  . Total knee arthroplasty Right 2007  . Knee cartilage surgery Left 1972    "cut it open" (03/28/2013)  . Shoulder  surgery Right 1970's    "pulled muscle apart; sewed it back together" (03/28/2013  . Total knee arthroplasty  11/26/2012    Procedure: TOTAL KNEE ARTHROPLASTY;  Surgeon: Loanne Drilling, MD;  Location: [facility];  Service: Orthopedics;  Laterality: Left;  . Cardiac catheterization  03/21/2013  . Coronary angioplasty with stent placement  03/28/2013    "3 stents" (03/28/2013)  . Knee cartilage surgery Right ~ 1977    "cut it open" (03/28/2013)  . Shoulder hemi-arthroplasty Right 1987    "got hit by sled & dragged down sheet > 200 feet; tore shoulder up" (03/28/2013)  . Cataract extraction w/ intraocular lens  implant, bilateral Bilateral 2000's  . Pacemaker insertion  10/05/10    MDT Adapta L implanted by Dr [name] for mobitz II second degree AV block  . Percutaneous coronary rotoblator intervention (pci-r) N/A 03/28/2013    Procedure: PERCUTANEOUS CORONARY ROTOBLATOR INTERVENTION (PCI-R);  Surgeon: Corky Crafts, MD;  Location: [facility] CATH LAB;  Service: Cardiovascular;  Laterality: N/A;     Current Outpatient Prescriptions  Medication Sig Dispense Refill  . acetaminophen (TYLENOL) 500 MG tablet Take 1,000 mg by mouth every 6 (six) hours as needed. For leg pain.    Marland Kitchen atorvastatin (LIPITOR) 10 MG tablet TAKE 1 TABLET (10 MG TOTAL) BY MOUTH DAILY AT 6 PM. 30 tablet 10  . cholecalciferol (VITAMIN D) 1000  UNITS tablet Take 5,000 Units by mouth daily.     . furosemide (LASIX) 40 MG tablet TAKE 1 TABLET (40 MG TOTAL) BY MOUTH DAILY. 30 tablet 9  . metoprolol tartrate (LOPRESSOR) 25 MG tablet TAKE 1 TABLET (25 MG TOTAL) BY MOUTH 2 (TWO) TIMES DAILY. 60 tablet 11  . Misc Natural Products (OSTEO BI-FLEX ADV DOUBLE ST PO) Take 1 tablet by mouth daily.    . rivaroxaban (XARELTO) 20 MG TABS tablet Take 1 tablet (20 mg total) by mouth daily with supper. 30 tablet 6   No current facility-administered medications for this visit.    Allergies:   Hydromorphone hcl and Penicillins   Social History:  The  patient  reports that he quit smoking about 42 years ago. His smoking use included Cigarettes. He has a 26 pack-year smoking history. He has never used smokeless tobacco. He reports that he does not drink alcohol or use illicit drugs.   Family History:  The patient's family history includes CAD (age of onset: 29) in his brother; Stroke in his brother. There is no history of Heart attack or Hypertension.    ROS:  Please see the history of present illness.   All other systems are reviewed and negative.    PHYSICAL EXAM: VS:  BP 132/72 mmHg  Pulse 84  Ht 5\' 8"  (1.727 m)  Wt 210 lb 3.2 oz (95.346 kg)  BMI 31.97 kg/m2 , BMI Body mass index is 31.97 kg/(m^2). GEN: Well nourished, well developed, in no acute distress HEENT: normal Neck: no JVD, carotid bruits, or masses Cardiac: RRR; no murmurs, rubs, or gallops,no edema  Respiratory:  clear to auscultation bilaterally, normal work of breathing GI: soft, nontender, nondistended, + BS MS: no deformity or atrophy Skin: warm and dry, device pocket is well healed Neuro:  Strength and sensation are intact Psych: euthymic mood, full affect  Device interrogation is reviewed today in detail.  See PaceArt for details.   Recent Labs: 12/30/2015: BUN 14; Creat 1.10; Potassium 3.6; Sodium 141 12/31/2015: Hemoglobin 13.2; Platelets 192    Lipid Panel     Component Value Date/Time   CHOL  05/08/2009 0337    156        ATP III CLASSIFICATION:  <200     mg/dL   Desirable  707-867  mg/dL   Borderline High  >=544    mg/dL   High          TRIG 73 05/08/2009 0337   HDL 42 05/08/2009 0337   CHOLHDL 3.7 05/08/2009 0337   VLDL 15 05/08/2009 0337   LDLCALC  05/08/2009 0337    99        Total Cholesterol/HDL:CHD Risk Coronary Heart Disease Risk Table                     Men   Women  1/2 Average Risk   3.4   3.3  Average Risk       5.0   4.4  2 X Average Risk   9.6   7.1  3 X Average Risk  23.4   11.0        Use the calculated Patient  Ratio above and the CHD Risk Table to determine the patient's CHD Risk.        ATP III CLASSIFICATION (LDL):  <100     mg/dL   Optimal  920-100  mg/dL   Near or Above  Optimal  130-159  mg/dL   Borderline  161-096  mg/dL   High  >045     mg/dL   Very High     Wt Readings from Last 3 Encounters:  01/25/16 210 lb 3.2 oz (95.346 kg)  11/11/15 204 lb (92.534 kg)  10/14/15 209 lb (94.802 kg)      Other studies Reviewed: Additional studies/ records that were reviewed today include: ED notes reviewed, Ms Samuel Bouche notes reviewed   ASSESSMENT AND PLAN:  1. Mobitz II second degree AV block Normal pacemaker function See Pace Art report No changes today  2. Ischemic CM/ CAD No ischemic symptoms No changes  3. AFib No symptoms Well controlled off of AAD therapy chads2vasc score is at least 5 Tolerating xarelto crcl is 66, no changes today  4. HTN Stable No change required today  carelink Return to see EP PA in 1 year Follow-up with Dr Eldridge Dace as scheduled  Current medicines are reviewed at length with the patient today.   The patient does not have concerns regarding his medicines.  The following changes were made today:  none  Signed, Hillis Range, MD  01/25/2016 2:12 PM     [facility] [address] [place] [address] [phone] (office) [phone] (fax)

## 2016-01-25 NOTE — Patient Instructions (Signed)
Medication Instructions:  Your physician recommends that you continue on your current medications as directed. Please refer to the Current Medication list given to you today.   Labwork: None ordered   Testing/Procedures: None ordered   Follow-Up: Your physician wants you to follow-up in: 12 months with Gregory Edwards, PA You will receive a reminder letter in the mail two months in advance. If you don't receive a letter, please call our office to schedule the follow-up appointment.  Remote monitoring is used to monitor your Pacemaker  from home. This monitoring reduces the number of office visits required to check your device to one time per year. It allows [place] to keep an eye on the functioning of your device to ensure it is working properly. You are scheduled for a device check from home on 04/25/16. You may send your transmission at any time that day. If you have a wireless device, the transmission will be sent automatically. After your physician reviews your transmission, you will receive a postcard with your next transmission date.     Any Other Special Instructions Will Be Listed Below (If Applicable).     If you need a refill on your cardiac medications before your next appointment, please call your pharmacy.

## 2016-01-29 ENCOUNTER — Telehealth: Payer: Self-pay

## 2016-01-29 NOTE — Telephone Encounter (Signed)
Wife calling to clarify when to stop his Xarelto.  Advised to stop 48 hrs prior to procedure.  She also states he is taking fish oil and Vit D.  Advised to stop the medications 1 week prior to surgery.  She states he is also going to have nose surgery in near future. Verbalizes understanding.

## 2016-01-29 NOTE — Telephone Encounter (Signed)
Pt's wife called, LM on VM in Coumadin Clinic pt is scheduled for surgery (did not leave detail of type of surgery or date) and she wanted to clarify when pt is to stop medications prior to surgery.  This is not a pt in our anticoagulation clinic, transferred to our VM by mistake.  Will forward message to triage to return call and address questions.

## 2016-02-01 ENCOUNTER — Encounter: Payer: Medicare Other | Admitting: Internal Medicine

## 2016-02-01 NOTE — Progress Notes (Signed)
Chart reviewed by Dr Hart Rochester, due to pt history of OSA and no CPAP, and extensive heart history, patient will be better served being done at Main OR.

## 2016-02-03 ENCOUNTER — Telehealth: Payer: Self-pay

## 2016-02-03 ENCOUNTER — Encounter (HOSPITAL_COMMUNITY): Payer: Self-pay | Admitting: *Deleted

## 2016-02-03 ENCOUNTER — Other Ambulatory Visit: Payer: Self-pay | Admitting: Otolaryngology

## 2016-02-03 ENCOUNTER — Other Ambulatory Visit: Payer: Self-pay | Admitting: Orthopedic Surgery

## 2016-02-03 NOTE — Progress Notes (Signed)
Anesthesia Chart Review: SAME DAY WORK-UP.   Patient is a 82 year old male scheduled for septoplasty with intranasal skin graft (Dr. Dillard Cannon) and right limited open carpal tunnel release (Dr. Amanda Pea) on 02/05/16. He has been having recurrent right epistaxis with finding of right septal mucosa lesion s/p multiple cauterizations. Procedure was initially scheduled at [facility], but anesthesiologist Dr. Hart Rochester recommended it be moved to the main OR due to his cardiac and OSA history.  History includes former smoker, post-operative N/V, CAD s/p stents to LAD and CX '14 with rotational atherectomy, ischemic cardiomyopathy, paroxysmal VT '14, PAF (10/2015), syncope Mobitz II s/p Medtronic pacemaker 10/05/10, GERD, HTN, exertional dyspnea, anemia, HLD, anxiety, OSA on CPAP, multiple pulmonary nodules (stabe on 12/17/08 CT and felt likely benign).  PCP is Dr. Tally Joe. Primary cardiologist is Dr. Eldridge Dace., last visit 10/05/15  EP cardiologist is Dr. [name], last visit 01/25/16 with normal pacemaker function at that time. .   Meds include Lipitor, Lasix, Lopressor, fish oil, Xarelto (was instructed by cardiology to hold 48 hours before surgery, but he's been holding since 01/31/16.)  10/14/15 EKG: Atrial paced rhythm with prolonged AV conduction. Moderate voltage criteria for LVH, may be normal variant.  11/25/15 Echo: Study Conclusions - Left ventricle: The cavity size was normal. Wall thickness was  increased in a pattern of mild LVH. Systolic function was normal.  The estimated ejection fraction was in the range of 55% to 60%.  Wall motion was normal; there were no regional wall motion  abnormalities. Doppler parameters are consistent with abnormal  left ventricular relaxation (grade 1 diastolic dysfunction). The  E/e&' ratio is between 8-15, suggesting indeterminate LV filling  pressure. - Aortic valve: Trileaflet; mildly calcified leaflets. Mild  stenosis. Mean gradient (S): 10 mm  Hg. Peak gradient (S): 18 mm  Hg. Valve area (VTI): 1.76 cm^2. Valve area (Vmax): 1.71 cm^2.  Valve area (Vmean): 1.84 cm^2. - Mitral valve: Mildly thickened leaflets . There was trivial  regurgitation. - Right ventricle: The cavity size was mildly dilated. Wall  thickness was normal. The moderator band was prominent. Pacer  wire or catheter noted in right ventricle. Systolic function was  normal. - Right atrium: The atrium was mildly dilated. Pacer wire or  catheter noted in right atrium. - Tricuspid valve: There was mild regurgitation. - Pulmonary arteries: PA peak pressure: 26 mm Hg (S). - Inferior vena cava: The vessel was normal in size. The  respirophasic diameter changes were in the normal range (= 50%),  consistent with normal central venous pressure. Impressions: - Compared to a prior study in 2015, there are no significant  interval changes.  03/22/13 LHC:  HEMODYNAMICS: Aortic pressure was 155/63; LV pressure was 155/21; LVEDP 26. There was no gradient between the left ventricle and aorta.  ANGIOGRAPHIC DATA: - The left main coronary artery is widely patent. - The left anterior descending artery is a large vessel. There is moderate-severe proximal disease. There is a severe focal stenosis in the mid portion.  - The left circumflex artery is a large vessel. There is a moderate proximal vessel stenosis. There is a large OM1 with mild atherosclerosis. - The right coronary artery is a large vessel with mild diffuse atherosclerosis. LEFT VENTRICULOGRAM: Left ventricular angiogram was done in the 30 RAO projection and revealed severely decreased systolic function with an estimated ejection fraction of 25-30%. LVEDP was 26 mmHg. Abdominal Aortogram: No AAA. Bilateral single renal arteries are widely patent. Patent aortoiliac bifurcation. IMPRESSIONS: 1. Normal left  main coronary artery. 2. Severe disease in the proximal to mid left anterior descending  artery. 3. Moderate to severe proximal disease in the left circumflex artery. 4. Mild disease in the right coronary artery. 5. Severely decreased left ventricular systolic function. LVEDP 26 mmHg. Ejection fraction 30%. RECOMMENDATION: Medical therapy for LV dysfunction which appears out of proportion for degree of CAD. Consider angioplasty. Would have to consider rotational atherectomy. (He is s/p 1) successful rotational atherectomy of the proximal mid LAD, cutting balloon angioplasty of the proximal to mid LAD following overlapping DES stents 2) successful PCI of the proximal CX with DES on 03/28/13.)  He is scheduled for labs on arrival.   Velna Ochs [facility] Short Stay Center/Anesthesiology Phone [phone] 02/03/2016 4:41 PM

## 2016-02-03 NOTE — Progress Notes (Signed)
Spoke with Larita Fife, RN, to make Dr. Eldridge Dace, Cardiology, aware that pt stated that he stopped taking all of his medications except Lasix, last dose of Xarelto and Metoprolol were taken Sunday. Larita Fife, RN, stated that according to MD's note, pt was instructed to stop Xarelto 48 hours prior to procedure and pt was not instructed to stop Metoprolol.  Larita Fife, RN, advised that pt resume Metoprolol. Pt advised to resume taking Metoprolol and Lipitor as prescribed. Both pt and spouse verbalized understanding.

## 2016-02-03 NOTE — Telephone Encounter (Signed)
**Note De-Identified Kendell Gammon Obfuscation** Darral Dash, from pre admissions at [facility], called to make [place] aware that the pt stopped taking his Xarelto and his Metoprolol on Sunday 01/31/16  in preparation for his right wrist L.O. CTR. on 4/7. Darral Dash states that the pt reported to her that someone advised him to stop his meds on Sunday 01/31/16 and that he has only been taking his Lasix since then. The pt has been advised by pre admissions to resume his Metoprolol and all of his other medications except for Xarelto until his surgery and that he will be advised when to resume Xarelto after surgery.  Will forward to Dr Eldridge Dace as Lorain Childes.

## 2016-02-03 NOTE — Progress Notes (Signed)
Pt stated, " someone called and told me to stop my Metoprolol and Xarelto on Sunday, the only thing I take is the fluid pill. " Spoke with Murrell Redden, RN at [facility] to clarify if MD advised pt to take last dose of Xarelto and Metoprolol on Sunday. According to Synetta Fail, pt and spouse was advised to stop taking fish oil and vitamin D. Pt was not to stop taking Xarelto  until 48 hours prior to procedure and was not instructed to stop taking Metoprolol. Dixie Dials, RN,  at Dr. Allene Pyo office to clarify if MD advised pt to stop Xarelto and Metoprolol on Sunday; RN is not available, will f/u tomorrow. Spoke with Revonda Standard, [place], Anesthesia to clarify pt instructions for Metoprolol.

## 2016-02-03 NOTE — H&P (Signed)
PREOPERATIVE H&P  Chief Complaint: recurrent right epistaxis  HPI: Gregory Mccormick is a 82 y.o. male who presents for evaluation of recurrent right epistaxis over the last month. On exam he has a right septal mucosa lesion that has been bleeding. It's been cauterized several times in the office. He's taken to the OR for excision of lesion and possible skin graft.  Past Medical History  Diagnosis Date  . Obesity   . Vitamin D deficiency   . History of multiple pulmonary nodules   . Low testosterone   . ED (erectile dysfunction)   . Osteopenia   . OSA on CPAP     does not use cpap, does not like  . PONV (postoperative nausea and vomiting)   . Nonspecific abnormal unspecified cardiovascular function study   . Other primary cardiomyopathies   . Paroxysmal ventricular tachycardia (HCC)   . Pacemaker     MDT  . H/O hiatal hernia   . GERD (gastroesophageal reflux disease)   . Arthritis     "right knee" (03/28/2013  . Exertional shortness of breath     "recently" (03/28/2013)  . Essential hypertension, benign   . Other testicular hypofunction   . Anemia, unspecified   . Internal hemorrhoids without mention of complication   . Blood in stool   . CAD in native artery   . Hyperlipidemia   . Anxiety state, unspecified   . Obesity   . Cardiomyopathy (HCC)     EF 40%   Past Surgical History  Procedure Laterality Date  . Total knee arthroplasty Right 2007  . Knee cartilage surgery Left 1972    "cut it open" (03/28/2013)  . Shoulder surgery Right 1970's    "pulled muscle apart; sewed it back together" (03/28/2013  . Total knee arthroplasty  11/26/2012    Procedure: TOTAL KNEE ARTHROPLASTY;  Surgeon: Loanne Drilling, MD;  Location: [facility] ORS;  Service: Orthopedics;  Laterality: Left;  . Cardiac catheterization  03/21/2013  . Coronary angioplasty with stent placement  03/28/2013    "3 stents" (03/28/2013)  . Knee cartilage surgery Right ~ 1977    "cut it open" (03/28/2013)  . Shoulder  hemi-arthroplasty Right 1987    "got hit by sled & dragged down sheet > 200 feet; tore shoulder up" (03/28/2013)  . Cataract extraction w/ intraocular lens  implant, bilateral Bilateral 2000's  . Pacemaker insertion  10/05/10    MDT Adapta L implanted by Dr [name] for mobitz II second degree AV block  . Percutaneous coronary rotoblator intervention (pci-r) N/A 03/28/2013    Procedure: PERCUTANEOUS CORONARY ROTOBLATOR INTERVENTION (PCI-R);  Surgeon: Corky Crafts, MD;  Location: [facility] CATH LAB;  Service: Cardiovascular;  Laterality: N/A;   Social History   Social History  . Marital Status: Married    Spouse Name: N/A  . Number of Children: N/A  . Years of Education: N/A   Occupational History  . retired [employer]   Social History Main Topics  . Smoking status: Former Smoker -- 1.00 packs/day for 26 years    Types: Cigarettes    Quit date: 10/31/1973  . Smokeless tobacco: Never Used  . Alcohol Use: No  . Drug Use: No  . Sexual Activity: No   Other Topics Concern  . Not on file   Social History Narrative   Family History  Problem Relation Age of Onset  . Cancer    . Heart disease    . CAD Brother 40  . Heart attack Neg  Hx   . Hypertension Neg Hx   . Stroke Brother    Allergies  Allergen Reactions  . Hydromorphone Hcl Nausea And Vomiting  . Penicillins Hives    Has patient had a PCN reaction causing immediate rash, facial/tongue/throat swelling, SOB or lightheadedness with hypotension: Unknown Has patient had a PCN reaction causing severe rash involving mucus membranes or skin necrosis: No Has patient had a PCN reaction that required hospitalization No Has patient had a PCN reaction occurring within the last 10 years: No If all of the above answers are "NO", then may proceed with Cephalosporin use.    Prior to Admission medications   Medication Sig Start Date End Date Taking? Authorizing Provider  acetaminophen (TYLENOL) 500 MG tablet Take 1,000 mg by mouth  every 6 (six) hours as needed. For leg pain.   Yes Historical Provider, MD  atorvastatin (LIPITOR) 10 MG tablet TAKE 1 TABLET (10 MG TOTAL) BY MOUTH DAILY AT 6 PM. 11/09/15  Yes Corky Crafts, MD  cholecalciferol (VITAMIN D) 1000 UNITS tablet Take 5,000 Units by mouth daily.    Yes Historical Provider, MD  furosemide (LASIX) 40 MG tablet TAKE 1 TABLET (40 MG TOTAL) BY MOUTH DAILY. 01/29/15  Yes Corky Crafts, MD  metoprolol tartrate (LOPRESSOR) 25 MG tablet TAKE 1 TABLET (25 MG TOTAL) BY MOUTH 2 (TWO) TIMES DAILY. 10/12/15  Yes Corky Crafts, MD  Misc Natural Products (OSTEO BI-FLEX ADV DOUBLE ST PO) Take 1 tablet by mouth daily.   Yes Historical Provider, MD  Omega-3 Fatty Acids (FISH OIL PO) Take 1 tablet by mouth daily.   Yes Historical Provider, MD  rivaroxaban (XARELTO) 20 MG TABS tablet Take 1 tablet (20 mg total) by mouth daily with supper. 10/15/15  Yes Renee Norberto Sorenson, PA-C     Positive ROS: per HPI  All other systems have been reviewed and were otherwise negative with the exception of those mentioned in the HPI and as above.  Physical Exam: There were no vitals filed for this visit.  General: Alert, no acute distress Oral: Normal oral mucosa and tonsils Nasal: Bony prominence on the right lower septum with small ulcer and scabbing Neck: No palpable adenopathy or thyroid nodules Ear: Ear canal is clear with normal appearing TMs Cardiovascular: Regular rate and rhythm, no murmur.  Respiratory: Clear to auscultation Neurologic: Alert and oriented x 3   Assessment/Plan: EPISTAXIS Plan for Procedure(s): SEPTOPLASTY WITH INTRANASAL SKIN GRAFT RIGHT LIMITED OPEN CARPAL TUNNEL RELEASE   Dillard Cannon, MD 02/03/2016 9:04 AM

## 2016-02-03 NOTE — Progress Notes (Signed)
Pt SDW-pre-op call completed by both pt and spouse, [name], with pt consent. Pt and spouse verbalized understanding to stop taking vitamins, fish oil, herbal medications such as Osteo-Bi-Flex. Do not take any NSAIDs ie: Ibuprofen, Advil, Naproxen, BC and Goody Powder, or any medication containing Aspirin. Pt chart forwarded to anesthesia to review cardiac history.

## 2016-02-04 MED ORDER — LACTATED RINGERS IV SOLN
INTRAVENOUS | Status: DC
  Administered 2016-02-05: 09:00:00 via INTRAVENOUS

## 2016-02-04 MED ORDER — VANCOMYCIN HCL IN DEXTROSE 1-5 GM/200ML-% IV SOLN
1000.0000 mg | INTRAVENOUS | Status: AC
  Administered 2016-02-05: 1000 mg via INTRAVENOUS
  Filled 2016-02-04: qty 200

## 2016-02-05 ENCOUNTER — Ambulatory Visit (HOSPITAL_COMMUNITY): Payer: Medicare Other | Admitting: Vascular Surgery

## 2016-02-05 ENCOUNTER — Encounter (HOSPITAL_COMMUNITY): Payer: Self-pay | Admitting: Anesthesiology

## 2016-02-05 ENCOUNTER — Encounter (HOSPITAL_COMMUNITY)
Admission: RE | Disposition: A | Discharge status: Home / Self Care | Payer: Self-pay | Source: Ambulatory Visit | Attending: Otolaryngology

## 2016-02-05 ENCOUNTER — Ambulatory Visit (HOSPITAL_COMMUNITY)
Admission: RE | Admit: 2016-02-05 | Discharge: 2016-02-05 | Disposition: A | Discharge status: Home / Self Care | Payer: Medicare Other | Source: Ambulatory Visit | Attending: Otolaryngology | Admitting: Otolaryngology

## 2016-02-05 DIAGNOSIS — Z955 Presence of coronary angioplasty implant and graft: Secondary | ICD-10-CM | POA: Insufficient documentation

## 2016-02-05 DIAGNOSIS — E785 Hyperlipidemia, unspecified: Secondary | ICD-10-CM | POA: Diagnosis not present

## 2016-02-05 DIAGNOSIS — G5601 Carpal tunnel syndrome, right upper limb: Secondary | ICD-10-CM | POA: Insufficient documentation

## 2016-02-05 DIAGNOSIS — I2510 Atherosclerotic heart disease of native coronary artery without angina pectoris: Secondary | ICD-10-CM | POA: Insufficient documentation

## 2016-02-05 DIAGNOSIS — J3489 Other specified disorders of nose and nasal sinuses: Secondary | ICD-10-CM | POA: Insufficient documentation

## 2016-02-05 DIAGNOSIS — R040 Epistaxis: Secondary | ICD-10-CM | POA: Diagnosis not present

## 2016-02-05 DIAGNOSIS — Z950 Presence of cardiac pacemaker: Secondary | ICD-10-CM | POA: Diagnosis not present

## 2016-02-05 DIAGNOSIS — Z87891 Personal history of nicotine dependence: Secondary | ICD-10-CM | POA: Insufficient documentation

## 2016-02-05 HISTORY — PX: SEPTOPLASTY: SHX2393

## 2016-02-05 HISTORY — PX: CARPAL TUNNEL RELEASE: SHX101

## 2016-02-05 HISTORY — DX: Carpal tunnel syndrome, bilateral upper limbs: G56.03

## 2016-02-05 LAB — BASIC METABOLIC PANEL
Anion gap: 9 (ref 5–15)
BUN: 13 mg/dL (ref 6–20)
CO2: 25 mmol/L (ref 22–32)
Calcium: 8.9 mg/dL (ref 8.9–10.3)
Chloride: 108 mmol/L (ref 101–111)
Creatinine, Ser: 1.14 mg/dL (ref 0.61–1.24)
GFR calc Af Amer: >60 mL/min (ref >60)
GFR, EST NON AFRICAN AMERICAN: 57 mL/min — AB (ref >60)
GLUCOSE: 94 mg/dL (ref 65–99)
POTASSIUM: 4.1 mmol/L (ref 3.5–5.1)
Sodium: 142 mmol/L (ref 135–145)

## 2016-02-05 LAB — SURGICAL PCR SCREEN
MRSA, PCR: NEGATIVE
Staphylococcus aureus: POSITIVE — AB

## 2016-02-05 LAB — CBC
HEMATOCRIT: 38.6 % — AB (ref 39.0–52.0)
Hemoglobin: 12.4 g/dL — ABNORMAL LOW (ref 13.0–17.0)
MCH: 30.2 pg (ref 26.0–34.0)
MCHC: 32.1 g/dL (ref 30.0–36.0)
MCV: 93.9 fL (ref 78.0–100.0)
Platelets: 155 10*3/uL (ref 150–400)
RBC: 4.11 MIL/uL — ABNORMAL LOW (ref 4.22–5.81)
RDW: 12.8 % (ref 11.5–15.5)
WBC: 6.3 10*3/uL (ref 4.0–10.5)

## 2016-02-05 LAB — PROTIME-INR
INR: 1.13 (ref 0.00–1.49)
Prothrombin Time: 14.7 seconds (ref 11.6–15.2)

## 2016-02-05 SURGERY — SEPTOPLASTY, NOSE
Anesthesia: General | Site: Nose | Laterality: Right

## 2016-02-05 MED ORDER — MUPIROCIN CALCIUM 2 % EX CREA
TOPICAL_CREAM | CUTANEOUS | Status: DC | PRN
  Administered 2016-02-05: 1 via TOPICAL

## 2016-02-05 MED ORDER — LIDOCAINE HCL (CARDIAC) 20 MG/ML IV SOLN
INTRAVENOUS | Status: DC | PRN
  Administered 2016-02-05: 80 mg via INTRATRACHEAL

## 2016-02-05 MED ORDER — CHLORHEXIDINE GLUCONATE 4 % EX LIQD: 60.0000 mL | Freq: Once | CUTANEOUS | Status: DC

## 2016-02-05 MED ORDER — LACTATED RINGERS IV SOLN
INTRAVENOUS | Status: DC | PRN
  Administered 2016-02-05: 11:00:00 via INTRAVENOUS

## 2016-02-05 MED ORDER — BUPIVACAINE HCL (PF) 0.25 % IJ SOLN
INTRAMUSCULAR | Status: AC
  Filled 2016-02-05: qty 30

## 2016-02-05 MED ORDER — MEPERIDINE HCL 25 MG/ML IJ SOLN: 6.2500 mg | INTRAMUSCULAR | Status: DC | PRN

## 2016-02-05 MED ORDER — OXYCODONE HCL 5 MG/5ML PO SOLN: 5.0000 mg | Freq: Once | ORAL | Status: DC | PRN

## 2016-02-05 MED ORDER — CEPHALEXIN 500 MG PO CAPS: 500.0000 mg | ORAL_CAPSULE | Freq: Two times a day (BID) | ORAL | Status: DC

## 2016-02-05 MED ORDER — FENTANYL CITRATE (PF) 250 MCG/5ML IJ SOLN
INTRAMUSCULAR | Status: DC | PRN
  Administered 2016-02-05 (×2): 50 ug via INTRAVENOUS

## 2016-02-05 MED ORDER — BUPIVACAINE HCL (PF) 0.25 % IJ SOLN
INTRAMUSCULAR | Status: DC | PRN
  Administered 2016-02-05: 5 mL

## 2016-02-05 MED ORDER — OXYMETAZOLINE HCL 0.05 % NA SOLN
NASAL | Status: DC | PRN
  Administered 2016-02-05: 1 via TOPICAL

## 2016-02-05 MED ORDER — BACITRACIN ZINC 500 UNIT/GM EX OINT
TOPICAL_OINTMENT | CUTANEOUS | Status: AC
  Filled 2016-02-05: qty 28.35

## 2016-02-05 MED ORDER — 0.9 % SODIUM CHLORIDE (POUR BTL) OPTIME
TOPICAL | Status: DC | PRN
  Administered 2016-02-05: 1000 mL

## 2016-02-05 MED ORDER — LACTATED RINGERS IV SOLN: INTRAVENOUS | Status: DC

## 2016-02-05 MED ORDER — HYDROMORPHONE HCL 1 MG/ML IJ SOLN: 0.2500 mg | INTRAMUSCULAR | Status: DC | PRN

## 2016-02-05 MED ORDER — OXYCODONE HCL 5 MG PO TABS: 5.0000 mg | ORAL_TABLET | Freq: Once | ORAL | Status: DC | PRN

## 2016-02-05 MED ORDER — OXYMETAZOLINE HCL 0.05 % NA SOLN
NASAL | Status: AC
  Filled 2016-02-05: qty 15

## 2016-02-05 MED ORDER — FENTANYL CITRATE (PF) 250 MCG/5ML IJ SOLN
INTRAMUSCULAR | Status: AC
  Filled 2016-02-05: qty 5

## 2016-02-05 MED ORDER — LIDOCAINE-EPINEPHRINE 1 %-1:100000 IJ SOLN
INTRAMUSCULAR | Status: DC | PRN
  Administered 2016-02-05: 9 mL

## 2016-02-05 MED ORDER — MUPIROCIN CALCIUM 2 % EX CREA
TOPICAL_CREAM | CUTANEOUS | Status: AC
  Filled 2016-02-05: qty 15

## 2016-02-05 MED ORDER — PROPOFOL 10 MG/ML IV BOLUS
INTRAVENOUS | Status: DC | PRN
  Administered 2016-02-05: 100 mg via INTRAVENOUS
  Administered 2016-02-05: 20 mg via INTRAVENOUS

## 2016-02-05 MED ORDER — SUCCINYLCHOLINE CHLORIDE 20 MG/ML IJ SOLN
INTRAMUSCULAR | Status: DC | PRN
  Administered 2016-02-05: 120 mg via INTRAVENOUS

## 2016-02-05 MED ORDER — LIDOCAINE-EPINEPHRINE 1 %-1:100000 IJ SOLN
INTRAMUSCULAR | Status: AC
  Filled 2016-02-05: qty 1

## 2016-02-05 SURGICAL SUPPLY — 81 items
BALL CTTN LRG ABS STRL LF (GAUZE/BANDAGES/DRESSINGS) ×2
BANDAGE ACE 4X5 VEL STRL LF (GAUZE/BANDAGES/DRESSINGS) ×4 IMPLANT
BANDAGE ELASTIC 3 VELCRO ST LF (GAUZE/BANDAGES/DRESSINGS) ×4 IMPLANT
BANDAGE ELASTIC 4 VELCRO ST LF (GAUZE/BANDAGES/DRESSINGS) ×2 IMPLANT
BLADE EAR TYMPAN 2.5 60D BEAV (BLADE) ×2 IMPLANT
BLADE SURG 15 STRL LF DISP TIS (BLADE) IMPLANT
BLADE SURG 15 STRL SS (BLADE)
BNDG GAUZE ELAST 4 BULKY (GAUZE/BANDAGES/DRESSINGS) ×8 IMPLANT
CANISTER SUCTION 2500CC (MISCELLANEOUS) ×4 IMPLANT
CLOSURE WOUND 1/2 X4 (GAUZE/BANDAGES/DRESSINGS)
COAGULATOR SUCT 8FR VV (MISCELLANEOUS) ×2 IMPLANT
CORDS BIPOLAR (ELECTRODE) ×4 IMPLANT
COTTONBALL LRG STERILE PKG (GAUZE/BANDAGES/DRESSINGS) ×3 IMPLANT
COVER SURGICAL LIGHT HANDLE (MISCELLANEOUS) ×4 IMPLANT
CUFF TOURNIQUET SINGLE 18IN (TOURNIQUET CUFF) ×4 IMPLANT
CUFF TOURNIQUET SINGLE 24IN (TOURNIQUET CUFF) IMPLANT
DRAPE EXTREMITY T 121X128X90 (DRAPE) ×2 IMPLANT
DRAPE PROXIMA HALF (DRAPES) ×2 IMPLANT
DRAPE SURG 17X23 STRL (DRAPES) ×2 IMPLANT
DRESSING NASAL KENNEDY 3.5X.9 (MISCELLANEOUS) ×2 IMPLANT
DRSG EMULSION OIL 3X3 NADH (GAUZE/BANDAGES/DRESSINGS) ×2 IMPLANT
DRSG NASAL KENNEDY 3.5X.9 (MISCELLANEOUS)
ELECT COATED BLADE 2.86 ST (ELECTRODE) ×2 IMPLANT
ELECT REM PT RETURN 9FT ADLT (ELECTROSURGICAL) ×4
ELECTRODE REM PT RTRN 9FT ADLT (ELECTROSURGICAL) IMPLANT
EVACUATOR 1/8 PVC DRAIN (DRAIN) IMPLANT
GAUZE SPONGE 2X2 8PLY STRL LF (GAUZE/BANDAGES/DRESSINGS) IMPLANT
GAUZE SPONGE 4X4 12PLY STRL (GAUZE/BANDAGES/DRESSINGS) ×4 IMPLANT
GAUZE SPONGE 4X4 16PLY XRAY LF (GAUZE/BANDAGES/DRESSINGS) ×3 IMPLANT
GAUZE XEROFORM 1X8 LF (GAUZE/BANDAGES/DRESSINGS) ×4 IMPLANT
GLOVE BIO SURGEON STRL SZ8 (GLOVE) ×2 IMPLANT
GLOVE BIOGEL M STRL SZ7.5 (GLOVE) ×4 IMPLANT
GLOVE BIOGEL PI IND STRL 6.5 (GLOVE) IMPLANT
GLOVE BIOGEL PI IND STRL 8.5 (GLOVE) IMPLANT
GLOVE BIOGEL PI INDICATOR 6.5 (GLOVE) ×4
GLOVE BIOGEL PI INDICATOR 8.5 (GLOVE) ×2
GLOVE ECLIPSE 6.5 STRL STRAW (GLOVE) ×2 IMPLANT
GLOVE SS BIOGEL STRL SZ 7.5 (GLOVE) ×2 IMPLANT
GLOVE SS BIOGEL STRL SZ 8 (GLOVE) ×2 IMPLANT
GLOVE SUPERSENSE BIOGEL SZ 7.5 (GLOVE) ×2
GLOVE SUPERSENSE BIOGEL SZ 8 (GLOVE) ×2
GOWN PREVENTION PLUS XXLARGE (GOWN DISPOSABLE) ×2 IMPLANT
GOWN STRL NON-REIN LRG LVL3 (GOWN DISPOSABLE) ×2 IMPLANT
GOWN STRL REUS W/ TWL XL LVL3 (GOWN DISPOSABLE) ×8 IMPLANT
GOWN STRL REUS W/TWL XL LVL3 (GOWN DISPOSABLE) ×12
KIT BASIN OR (CUSTOM PROCEDURE TRAY) ×6 IMPLANT
KIT ROOM TURNOVER OR (KITS) ×6 IMPLANT
LOOP VESSEL MAXI BLUE (MISCELLANEOUS) ×2 IMPLANT
NDL HYPO 25GX1X1/2 BEV (NEEDLE) IMPLANT
NEEDLE 27GAX1X1/2 (NEEDLE) ×4 IMPLANT
NEEDLE HYPO 25GX1X1/2 BEV (NEEDLE) ×4 IMPLANT
NS IRRIG 1000ML POUR BTL (IV SOLUTION) ×6 IMPLANT
PAD ARMBOARD 7.5X6 YLW CONV (MISCELLANEOUS) ×12 IMPLANT
PAD CAST 4YDX4 CTTN HI CHSV (CAST SUPPLIES) ×4 IMPLANT
PADDING CAST COTTON 4X4 STRL (CAST SUPPLIES) ×4
PATTIES SURGICAL .5 X3 (DISPOSABLE) ×4 IMPLANT
PENCIL FOOT CONTROL (ELECTRODE) ×2 IMPLANT
SOLUTION BETADINE 4OZ (MISCELLANEOUS) ×4 IMPLANT
SPECIMEN JAR SMALL (MISCELLANEOUS) ×3 IMPLANT
SPLINT NASAL DOYLE BI-VL (GAUZE/BANDAGES/DRESSINGS) IMPLANT
SPONGE GAUZE 2X2 STER 10/PKG (GAUZE/BANDAGES/DRESSINGS)
SPONGE GAUZE 4X4 12PLY STER LF (GAUZE/BANDAGES/DRESSINGS) ×2 IMPLANT
SPONGE SCRUB IODOPHOR (GAUZE/BANDAGES/DRESSINGS) ×4 IMPLANT
STOCKINETTE 6  STRL (DRAPES) ×2
STOCKINETTE 6 STRL (DRAPES) IMPLANT
STRIP CLOSURE SKIN 1/2X4 (GAUZE/BANDAGES/DRESSINGS) IMPLANT
SUT CHROMIC 4 0 PS 2 18 (SUTURE) ×2 IMPLANT
SUT CHROMIC 4 0 PS 5 (SUTURE) ×4 IMPLANT
SUT ETHILON 3 0 PS 1 (SUTURE) ×2 IMPLANT
SUT ETHILON 5 0 P 3 18 (SUTURE) ×2
SUT NYLON ETHILON 5-0 P-3 1X18 (SUTURE) IMPLANT
SUT PROLENE 3 0 SH 1 (SUTURE) ×2 IMPLANT
SUT PROLENE 4 0 PS 2 18 (SUTURE) ×2 IMPLANT
SYR CONTROL 10ML LL (SYRINGE) ×3 IMPLANT
SYSTEM CHEST DRAIN TLS 7FR (DRAIN) IMPLANT
TAPE CLOTH SURG 4X10 WHT LF (GAUZE/BANDAGES/DRESSINGS) ×3 IMPLANT
TOWEL OR 17X24 6PK STRL BLUE (TOWEL DISPOSABLE) ×7 IMPLANT
TOWEL OR 17X26 10 PK STRL BLUE (TOWEL DISPOSABLE) ×4 IMPLANT
TRAY ENT MC OR (CUSTOM PROCEDURE TRAY) ×4 IMPLANT
TUBE EVACUATION TLS (MISCELLANEOUS) ×2 IMPLANT
UNDERPAD 30X30 INCONTINENT (UNDERPADS AND DIAPERS) ×4 IMPLANT

## 2016-02-05 NOTE — Transfer of Care (Signed)
Immediate Anesthesia Transfer of Care Note  Patient: Gregory Mccormick  Procedure(s) Performed: Procedure(s): SEPTOPLASTY WITH INTRANASAL SKIN GRAFT (N/A) RIGHT LIMITED OPEN CARPAL TUNNEL RELEASE (Right)  Patient Location: PACU  Anesthesia Type:General  Level of Consciousness: awake  Airway & Oxygen Therapy: Patient Spontanous Breathing  Post-op Assessment: Report given to RN and Post -op Vital signs reviewed and stable  Post vital signs: Reviewed and stable  Last Vitals:  Filed Vitals:   02/05/16 0818  BP: 164/72  Pulse: 61  Temp: 36.9 C  Resp: 18    Complications: No apparent anesthesia complications

## 2016-02-05 NOTE — Brief Op Note (Signed)
02/05/2016  12:42 PM  PATIENT:  Gregory Mccormick  82 y.o. male  PRE-OPERATIVE DIAGNOSIS:  EPISTAXIS  POST-OPERATIVE DIAGNOSIS:  EPISTAXIS  PROCEDURE:  Procedure(s): SEPTOPLASTY WITH INTRANASAL SKIN GRAFT (N/A) RIGHT LIMITED OPEN CARPAL TUNNEL RELEASE (Right)  SURGEON:  Surgeon(s) and Role: Panel 1:    * Drema Halon, MD - Primary  Panel 2:    * Dominica Severin, MD - Primary  PHYSICIAN ASSISTANT:   ASSISTANTS: none   ANESTHESIA:   general  EBL:  Total I/O In: 700 [I.V.:700] Out: -   BLOOD ADMINISTERED:none  DRAINS: none   LOCAL MEDICATIONS USED:  XYLOCAINE with EPI 10 cc  SPECIMEN:  Source of Specimen:  right nasal septal tissue  DISPOSITION OF SPECIMEN:  PATHOLOGY  COUNTS:  YES  TOURNIQUET:    DICTATION: .Other Dictation: Dictation Number [id number]  PLAN OF CARE: Discharge to home after PACU  PATIENT DISPOSITION:  PACU - hemodynamically stable.   Delay start of Pharmacological VTE agent (>24hrs) due to surgical blood loss or risk of bleeding: yes

## 2016-02-05 NOTE — Interval H&P Note (Signed)
History and Physical Interval Note:  02/05/2016 10:45 AM  Gregory Mccormick  has presented today for surgery, with the diagnosis of EPISTAXIS  The various methods of treatment have been discussed with the patient and family. After consideration of risks, benefits and other options for treatment, the patient has consented to  Procedure(s): SEPTOPLASTY WITH INTRANASAL SKIN GRAFT (N/A) RIGHT LIMITED OPEN CARPAL TUNNEL RELEASE (Right) as a surgical intervention .  The patient's history has been reviewed, patient examined, no change in status, stable for surgery.  I have reviewed the patient's chart and labs.  Questions were answered to the patient's satisfaction.     [name]

## 2016-02-05 NOTE — Anesthesia Procedure Notes (Signed)
Procedure Name: Intubation Date/Time: 02/05/2016 11:13 AM Performed by: Brien Mates D Pre-anesthesia Checklist: Patient identified, Emergency Drugs available, Suction available, Patient being monitored and Timeout performed Patient Re-evaluated:Patient Re-evaluated prior to inductionOxygen Delivery Method: Circle system utilized Preoxygenation: Pre-oxygenation with 100% oxygen Intubation Type: IV induction Ventilation: Mask ventilation without difficulty Laryngoscope Size: Miller and 2 Grade View: Grade I Tube type: Oral Tube size: 7.5 mm Number of attempts: 1 Airway Equipment and Method: Stylet Placement Confirmation: ETT inserted through vocal cords under direct vision,  positive ETCO2 and breath sounds checked- equal and bilateral Secured at: 22 cm Tube secured with: Tape Dental Injury: Teeth and Oropharynx as per pre-operative assessment

## 2016-02-05 NOTE — Discharge Instructions (Signed)
Leave cotton ball in nose over the weekend Follow up with Dr Ezzard Standing on Monday at 4:30 to have the cotton ball removed Keflex 500 mg bid for 5 days

## 2016-02-05 NOTE — Op Note (Signed)
See [id number]#[id number] Amanda Pea MD

## 2016-02-05 NOTE — Anesthesia Postprocedure Evaluation (Signed)
Anesthesia Post Note  Patient: Gregory Mccormick  Procedure(s) Performed: Procedure(s) (LRB): SEPTOPLASTY WITH INTRANASAL SKIN GRAFT (N/A) RIGHT LIMITED OPEN CARPAL TUNNEL RELEASE (Right)  Patient location during evaluation: PACU Anesthesia Type: General Level of consciousness: awake and alert Pain management: pain level controlled Vital Signs Assessment: post-procedure vital signs reviewed and stable Respiratory status: spontaneous breathing, nonlabored ventilation and respiratory function stable Cardiovascular status: blood pressure returned to baseline and stable Postop Assessment: no signs of nausea or vomiting Anesthetic complications: no    Last Vitals:  Filed Vitals:   02/05/16 1345 02/05/16 1415  BP: 163/82 171/81  Pulse: 59 59  Temp: 36.9 C   Resp: 17 18    Last Pain:  Filed Vitals:   02/05/16 1417  PainSc: 0-No pain                 Irie Fiorello A

## 2016-02-05 NOTE — Anesthesia Preprocedure Evaluation (Addendum)
Anesthesia Evaluation  Patient identified by MRN, date of birth, ID band Patient awake    Reviewed: Allergy & Precautions, NPO status , Patient's Chart, lab work & pertinent test results  Airway Mallampati: II  TM Distance: >3 FB Neck ROM: Full    Dental  (+) Teeth Intact, Missing, Dental Advisory Given   Pulmonary sleep apnea and Continuous Positive Airway Pressure Ventilation , former smoker,    breath sounds clear to auscultation       Cardiovascular hypertension, Pt. on medications + CAD  + pacemaker  Rhythm:Regular Rate:Normal     Neuro/Psych    GI/Hepatic GERD  Medicated and Controlled,  Endo/Other    Renal/GU      Musculoskeletal  (+) Arthritis ,   Abdominal   Peds  Hematology   Anesthesia Other Findings   Reproductive/Obstetrics                            Anesthesia Physical Anesthesia Plan  ASA: III  Anesthesia Plan: General   Post-op Pain Management:    Induction: Intravenous  Airway Management Planned: Oral ETT  Additional Equipment:   Intra-op Plan:   Post-operative Plan: Extubation in OR  Informed Consent: I have reviewed the patients History and Physical, chart, labs and discussed the procedure including the risks, benefits and alternatives for the proposed anesthesia with the patient or authorized representative who has indicated his/her understanding and acceptance.   Dental advisory given  Plan Discussed with: CRNA, Anesthesiologist and Surgeon  Anesthesia Plan Comments:         Anesthesia Quick Evaluation

## 2016-02-06 NOTE — Op Note (Signed)
NAME:  Gregory Mccormick, Gregory Mccormick NO.:  [id number]  MEDICAL RECORD NO.:  [id number]  LOCATION:  [facility]                         FACILITY:  [facility]  PHYSICIAN:  Kristine Garbe. Ezzard Standing, M.D.DATE OF BIRTH:  [birth year]  DATE OF PROCEDURE:  02/05/2016 DATE OF DISCHARGE:  02/05/2016                              OPERATIVE REPORT   PREOPERATIVE DIAGNOSIS:  Recurrent right-sided epistaxis with chronic right septal sore.  POSTOPERATIVE DIAGNOSIS:  Recurrent right-sided epistaxis with chronic right septal sore.  OPERATIONS: 1. Excision of right septal lesion with full-thickness skin graft to     right septum, 2 cm size. 2. Right carpal tunnel release.  SURGEONS: 1. Kristine Garbe. Ezzard Standing, M.D. 2. Dionne Ano. Gramig, M.D. and he will dictate the carpal tunnel     release on the right.  ANESTHESIA:  General endotracheal.  COMPLICATIONS:  None.  BRIEF CLINICAL NOTE:  Gregory Mccormick is an 81 year old gentleman who has had frequent right-sided nosebleed over the last month.  He has had this cauterized several times in the office.  He has a chronic sore on the right side of the septum.  At this time, he was taken to the operating room because of frequent nosebleeds to have the sore removed, the biopsy and making rule out any neoplasm and will have a full-thickness skin graft placed on the right side of the septum after removal of the sore.  DESCRIPTION OF PROCEDURE:  The patient was brought to the operating room, underwent general endotracheal anesthesia.  The patient had a chronic sore on the right side of the septum.  The sore was elliptically excised back to normal-appearing mucosa of the septum.  There was some erosion of the cartilage that was missing in the middle of the sore where he had this also cauterized with silver nitrate.  Mucosal specimen was sent to Pathology.  A full-thickness skin graft was then obtained from the left supraclavicular area and was secured over the 2 x 1.5  cm sore with 4-0 chromic sutures.  After securing the graft over this open sore, silastic splints were secured to either side of the septum with a 3-0 Prolene suture and the nose was packed with cotton ball, soaked in Bactroban 2% ointment.  I will have him follow up in my office in 3 days to have the nasal packing removed and we will remove the splints in 1 week.          ______________________________ Kristine Garbe. Ezzard Standing, M.D.     CEN/MEDQ  D:  02/05/2016  T:  02/06/2016  Job:  [id number]

## 2016-02-06 NOTE — Op Note (Signed)
NAME:  Gregory Mccormick, Gregory Mccormick NO.:  [id number]  MEDICAL RECORD NO.:  [id number]  LOCATION:  [facility]                         FACILITY:  [facility]  PHYSICIAN:  Dionne Ano. Haliyah Fryman, M.D.DATE OF BIRTH:  [birth year]  DATE OF PROCEDURE: DATE OF DISCHARGE:  02/05/2016                              OPERATIVE REPORT   PREOPERATIVE DIAGNOSIS:  Right severe carpal tunnel syndrome.  POSTOPERATIVE DIAGNOSIS:  Right severe carpal tunnel syndrome.  PROCEDURE:  Right open carpal tunnel release.  SURGEON:  Dionne Ano. Amanda Pea, M.D.  ASSISTANT:  None.  COMPLICATIONS:  None.  ANESTHESIA:  General.  TOURNIQUET TIME:  Less than 20 minutes.  INDICATIONS:  A pleasant male, 82 years of age with severe carpal tunnel syndrome and desires release.  We are planning to perform this release in tandem with Dr. Dillard Cannon who is presiding over the nasal surgery.  OPERATION IN DETAIL:  The patient was seen by myself and Anesthesia, taken to the operative theater, underwent smooth induction of general anesthesia.  The arm was marked when attended preoperatively of course. Once in the operating room, he was prepped and draped in the usual sterile fashion with Betadine scrub and paint.  Following this, all landmarks were made visually.  Tourniquet was insufflated and incision was made after time-out was observed.  Incision was made 1 to 1.5 cm in nature.  Dissection was carried down and palmar fascia was incised. Distal edge of the transverse carpal ligament accessed and visualized. Following this, distal-to-proximal dissection was carried out until adequate room was available for canal, prepared toward devices followed by __________ blunt-tip rhytidectomy scissors.  Under 4.5 loupe magnification, the patient had complete release proximally and in the portions of the antebrachial fascia, the median nerve was closed and protected at all times.  There were no complicating features.  Following this,  I inspected the canal, secured hemostasis with bipolar electrocautery.  The patient tolerated this well.  There was no injury, problems, or other concerns.  We irrigated copiously closing with Prolene.  Hemostasis was excellent.  This was an uneventful release.  10 mL of Sensorcaine was placed for postop analgesia.  All sponge, needle, and instrument counts reported as correct.  The patient will be monitored and discharged home.  I will place him Nucynta given his history and allergies to pain medicines.  These notes have been discussed.  All questions have been encouraged and answered.     Dionne Ano. Amanda Pea, M.D.     [facility]  D:  02/05/2016  T:  02/06/2016  Job:  [id number]

## 2016-02-09 ENCOUNTER — Telehealth: Payer: Self-pay | Admitting: Cardiology

## 2016-02-09 ENCOUNTER — Ambulatory Visit: Payer: Medicare Other | Admitting: *Deleted

## 2016-02-09 ENCOUNTER — Encounter (HOSPITAL_COMMUNITY): Payer: Self-pay | Admitting: Otolaryngology

## 2016-02-09 NOTE — Telephone Encounter (Signed)
Spoke with pt and reminded pt of remote transmission that is due today. Pt verbalized understanding.   

## 2016-02-11 ENCOUNTER — Ambulatory Visit (INDEPENDENT_AMBULATORY_CARE_PROVIDER_SITE_OTHER): Payer: Medicare Other | Admitting: *Deleted

## 2016-02-11 DIAGNOSIS — I441 Atrioventricular block, second degree: Secondary | ICD-10-CM

## 2016-02-15 NOTE — Progress Notes (Signed)
Remote pacemaker transmission.   

## 2016-03-07 ENCOUNTER — Telehealth: Payer: Self-pay | Admitting: Pharmacist

## 2016-03-07 NOTE — Telephone Encounter (Signed)
Received phone call from patient's wife that he will be having 3 dental extractions done by Dr. Catha Brow, DDS. Spoke with [place] at Dr. Sharol Roussel office who was inquiring how long patient should hold Xarelto prior to extractions. Patient takes Xarelto for afib with CHADS2 score of 3 (HTN, age, HF) and no history of stroke. Ok for patient to hold Xarelto x24 hours prior to extractions. Per [place], earliest patient can be scheduled is this Wednesday, 5/10. Called pt to advise him to take his Xarelto today, hold his dose tomorrow 5/9 in preparation for his extractions on 5/10. Faxed clearance to Dr. Sharol Roussel office [phone].

## 2016-03-07 NOTE — Telephone Encounter (Signed)
Spoke with [place] with Dr. Sharol Roussel office.  They cannot schedule pt's procedure until 5/22.  Spoke with pt.  He will not take his Xarelto on 5/21 in preparation for the tooth extractions on 5/22.  He is agreeable.

## 2016-03-08 ENCOUNTER — Other Ambulatory Visit: Payer: Self-pay | Admitting: Interventional Cardiology

## 2016-03-11 NOTE — Telephone Encounter (Signed)
Noted  

## 2016-03-21 ENCOUNTER — Other Ambulatory Visit: Payer: Self-pay | Admitting: Orthopedic Surgery

## 2016-03-22 ENCOUNTER — Encounter: Payer: Self-pay | Admitting: Cardiology

## 2016-03-22 LAB — CUP PACEART REMOTE DEVICE CHECK
Battery Impedance: 276 Ohm
Brady Statistic AP VP Percent: 1 %
Brady Statistic AP VS Percent: 31 %
Brady Statistic AS VP Percent: 5 %
Brady Statistic AS VS Percent: 64 %
Implantable Lead Implant Date: 20111206
Implantable Lead Location: 753860
Implantable Lead Model: 5076
Lead Channel Impedance Value: 797 Ohm
Lead Channel Pacing Threshold Amplitude: 0.875 V
Lead Channel Pacing Threshold Pulse Width: 0.4 ms
Lead Channel Sensing Intrinsic Amplitude: 1 mV
Lead Channel Sensing Intrinsic Amplitude: 16 mV
Lead Channel Setting Pacing Pulse Width: 0.4 ms
MDC IDC LEAD IMPLANT DT: 20111206
MDC IDC LEAD LOCATION: 753859
MDC IDC MSMT BATTERY REMAINING LONGEVITY: 117 mo
MDC IDC MSMT BATTERY VOLTAGE: 2.78 V
MDC IDC MSMT LEADCHNL RA IMPEDANCE VALUE: 447 Ohm
MDC IDC MSMT LEADCHNL RV PACING THRESHOLD AMPLITUDE: 0.875 V
MDC IDC MSMT LEADCHNL RV PACING THRESHOLD PULSEWIDTH: 0.4 ms
MDC IDC SESS DTM: 20170413112735
MDC IDC SET LEADCHNL RA PACING AMPLITUDE: 2 V
MDC IDC SET LEADCHNL RV PACING AMPLITUDE: 2.5 V
MDC IDC SET LEADCHNL RV SENSING SENSITIVITY: 5.6 mV

## 2016-04-13 ENCOUNTER — Encounter (HOSPITAL_COMMUNITY)
Admission: RE | Admit: 2016-04-13 | Discharge: 2016-04-13 | Disposition: A | Discharge status: Home / Self Care | Payer: Medicare Other | Source: Ambulatory Visit | Attending: Orthopedic Surgery | Admitting: Orthopedic Surgery

## 2016-04-13 ENCOUNTER — Encounter (HOSPITAL_COMMUNITY): Payer: Self-pay

## 2016-04-13 DIAGNOSIS — Z87891 Personal history of nicotine dependence: Secondary | ICD-10-CM | POA: Insufficient documentation

## 2016-04-13 DIAGNOSIS — Z01818 Encounter for other preprocedural examination: Secondary | ICD-10-CM | POA: Insufficient documentation

## 2016-04-13 DIAGNOSIS — I10 Essential (primary) hypertension: Secondary | ICD-10-CM | POA: Diagnosis not present

## 2016-04-13 DIAGNOSIS — Z79899 Other long term (current) drug therapy: Secondary | ICD-10-CM | POA: Diagnosis not present

## 2016-04-13 DIAGNOSIS — I2510 Atherosclerotic heart disease of native coronary artery without angina pectoris: Secondary | ICD-10-CM | POA: Insufficient documentation

## 2016-04-13 DIAGNOSIS — G5602 Carpal tunnel syndrome, left upper limb: Secondary | ICD-10-CM | POA: Insufficient documentation

## 2016-04-13 DIAGNOSIS — Z955 Presence of coronary angioplasty implant and graft: Secondary | ICD-10-CM | POA: Insufficient documentation

## 2016-04-13 DIAGNOSIS — Z950 Presence of cardiac pacemaker: Secondary | ICD-10-CM | POA: Diagnosis not present

## 2016-04-13 DIAGNOSIS — E785 Hyperlipidemia, unspecified: Secondary | ICD-10-CM | POA: Diagnosis not present

## 2016-04-13 DIAGNOSIS — K219 Gastro-esophageal reflux disease without esophagitis: Secondary | ICD-10-CM | POA: Diagnosis not present

## 2016-04-13 DIAGNOSIS — Z01812 Encounter for preprocedural laboratory examination: Secondary | ICD-10-CM | POA: Insufficient documentation

## 2016-04-13 DIAGNOSIS — Z7901 Long term (current) use of anticoagulants: Secondary | ICD-10-CM | POA: Diagnosis not present

## 2016-04-13 DIAGNOSIS — G4733 Obstructive sleep apnea (adult) (pediatric): Secondary | ICD-10-CM | POA: Diagnosis not present

## 2016-04-13 HISTORY — DX: Personal history of urinary calculi: Z87.442

## 2016-04-13 LAB — BASIC METABOLIC PANEL
Anion gap: 8 (ref 5–15)
BUN: 12 mg/dL (ref 6–20)
CALCIUM: 9.3 mg/dL (ref 8.9–10.3)
CO2: 30 mmol/L (ref 22–32)
Chloride: 103 mmol/L (ref 101–111)
Creatinine, Ser: 1.15 mg/dL (ref 0.61–1.24)
GFR calc Af Amer: >60 mL/min (ref >60)
GFR, EST NON AFRICAN AMERICAN: 56 mL/min — AB (ref >60)
GLUCOSE: 100 mg/dL — AB (ref 65–99)
Potassium: 4 mmol/L (ref 3.5–5.1)
Sodium: 141 mmol/L (ref 135–145)

## 2016-04-13 LAB — CBC
HCT: 43.6 % (ref 39.0–52.0)
Hemoglobin: 13.8 g/dL (ref 13.0–17.0)
MCH: 29.6 pg (ref 26.0–34.0)
MCHC: 31.7 g/dL (ref 30.0–36.0)
MCV: 93.4 fL (ref 78.0–100.0)
Platelets: 185 10*3/uL (ref 150–400)
RBC: 4.67 MIL/uL (ref 4.22–5.81)
RDW: 14.1 % (ref 11.5–15.5)
WBC: 7 10*3/uL (ref 4.0–10.5)

## 2016-04-13 MED ORDER — CHLORHEXIDINE GLUCONATE 4 % EX LIQD: 60.0000 mL | Freq: Once | CUTANEOUS | Status: DC

## 2016-04-13 NOTE — Progress Notes (Signed)
Cardiologist is Dr.Varanasi with last visit 10-05-15  Multiple echo reports in epic   Stress test > 5 yrs ago   Heart cath in epic from 2014  EKG in epic from 10-14-15  CXR denies in past yr

## 2016-04-14 NOTE — Progress Notes (Signed)
Anesthesia Chart Review: Patient is a 82 year old male scheduled for left limited open carpal tunnel release on 04/21/16 by Dr. Amanda Pea. Anesthesia type is posted for MAC. He is s/p excision of right septal lesion with full-thickness skin graft (for recurrent epistaxis due to right septal mucosal lesion) and right limited open carpal tunnel release on 02/05/16.  History includes former smoker, post-operative N/V, CAD s/p stents to LAD and CX '14 with rotational atherectomy, ischemic cardiomyopathy, paroxysmal VT '14, PAF (10/2015), syncope Mobitz II s/p [employer] pacemaker 10/05/10, GERD, HTN, exertional dyspnea, anemia, HLD, anxiety, OSA on CPAP, multiple pulmonary nodules (stable on 12/17/08 CT and felt likely benign).  PCP is Dr. Tally Joe. Primary cardiologist is Dr. Eldridge Dace., last visit 10/05/15 EP cardiologist is Dr. [name], last visit 01/25/16 with normal pacemaker function at that time. He had remote transmission on 02/15/16 which was reviewed as normal.  Meds include Lipitor, Lasix, Lopressor, fish oil, Xarelto (to hold 48 hour before surgery as he was instructed by cardiology prior to his 01/2016 surgery).  10/14/15 EKG: Atrial paced rhythm with prolonged AV conduction. Moderate voltage criteria for LVH, may be normal variant.  11/25/15 Echo: Study Conclusions - Left ventricle: The cavity size was normal. Wall thickness was  increased in a pattern of mild LVH. Systolic function was normal.  The estimated ejection fraction was in the range of 55% to 60%.  Wall motion was normal; there were no regional wall motion  abnormalities. Doppler parameters are consistent with abnormal  left ventricular relaxation (grade 1 diastolic dysfunction). The  E/e&' ratio is between 8-15, suggesting indeterminate LV filling  pressure. - Aortic valve: Trileaflet; mildly calcified leaflets. Mild  stenosis. Mean gradient (S): 10 mm Hg. Peak gradient (S): 18 mm  Hg. Valve area (VTI): 1.76 cm^2. Valve  area (Vmax): 1.71 cm^2.  Valve area (Vmean): 1.84 cm^2. - Mitral valve: Mildly thickened leaflets . There was trivial  regurgitation. - Right ventricle: The cavity size was mildly dilated. Wall  thickness was normal. The moderator band was prominent. Pacer  wire or catheter noted in right ventricle. Systolic function was  normal. - Right atrium: The atrium was mildly dilated. Pacer wire or  catheter noted in right atrium. - Tricuspid valve: There was mild regurgitation. - Pulmonary arteries: PA peak pressure: 26 mm Hg (S). - Inferior vena cava: The vessel was normal in size. The  respirophasic diameter changes were in the normal range (= 50%),  consistent with normal central venous pressure. Impressions: - Compared to a prior study in 2015, there are no significant  interval changes.  03/22/13 LHC:  HEMODYNAMICS: Aortic pressure was 155/63; LV pressure was 155/21; LVEDP 26. There was no gradient between the left ventricle and aorta.  ANGIOGRAPHIC DATA: - The left main coronary artery is widely patent. - The left anterior descending artery is a large vessel. There is moderate-severe proximal disease. There is a severe focal stenosis in the mid portion.  - The left circumflex artery is a large vessel. There is a moderate proximal vessel stenosis. There is a large OM1 with mild atherosclerosis. - The right coronary artery is a large vessel with mild diffuse atherosclerosis. LEFT VENTRICULOGRAM: Left ventricular angiogram was done in the 30 RAO projection and revealed severely decreased systolic function with an estimated ejection fraction of 25-30%. LVEDP was 26 mmHg. Abdominal Aortogram: No AAA. Bilateral single renal arteries are widely patent. Patent aortoiliac bifurcation. IMPRESSIONS: 1. Normal left main coronary artery. 2. Severe disease in the proximal to mid  left anterior descending artery. 3. Moderate to severe proximal disease in the left circumflex  artery. 4. Mild disease in the right coronary artery. 5. Severely decreased left ventricular systolic function. LVEDP 26 mmHg. Ejection fraction 30%. RECOMMENDATION: Medical therapy for LV dysfunction which appears out of proportion for degree of CAD. Consider angioplasty. Would have to consider rotational atherectomy. (He is s/p 1) successful rotational atherectomy of the proximal mid LAD, cutting balloon angioplasty of the proximal to mid LAD following overlapping DES stents 2) successful PCI of the proximal CX with DES on 03/28/13.)  Preoperative labs noted. CBC WNL. Cr 1.15. Glucose 100. He is for PT/INR on the day of surgery.  He tolerated combined ENT/hand surgery two months ago. If no acute changes then I would anticipate that he could proceed as planned.  Velna Ochs [facility]/Anesthesiology Phone [phone] 04/14/2016 10:49 AM

## 2016-04-20 MED ORDER — VANCOMYCIN HCL IN DEXTROSE 1-5 GM/200ML-% IV SOLN
1000.0000 mg | INTRAVENOUS | Status: AC
  Administered 2016-04-21: 1000 mg via INTRAVENOUS
  Filled 2016-04-20: qty 200

## 2016-04-21 ENCOUNTER — Ambulatory Visit (HOSPITAL_COMMUNITY): Payer: Medicare Other | Admitting: Vascular Surgery

## 2016-04-21 ENCOUNTER — Encounter (HOSPITAL_COMMUNITY)
Admission: RE | Disposition: A | Discharge status: Home / Self Care | Payer: Self-pay | Source: Ambulatory Visit | Attending: Orthopedic Surgery

## 2016-04-21 ENCOUNTER — Ambulatory Visit (HOSPITAL_COMMUNITY)
Admission: RE | Admit: 2016-04-21 | Discharge: 2016-04-21 | Disposition: A | Discharge status: Home / Self Care | Payer: Medicare Other | Source: Ambulatory Visit | Attending: Orthopedic Surgery | Admitting: Orthopedic Surgery

## 2016-04-21 ENCOUNTER — Ambulatory Visit (HOSPITAL_COMMUNITY): Payer: Medicare Other | Admitting: Emergency Medicine

## 2016-04-21 ENCOUNTER — Encounter (HOSPITAL_COMMUNITY): Payer: Self-pay | Admitting: Surgery

## 2016-04-21 DIAGNOSIS — Z87891 Personal history of nicotine dependence: Secondary | ICD-10-CM | POA: Diagnosis not present

## 2016-04-21 DIAGNOSIS — Z8249 Family history of ischemic heart disease and other diseases of the circulatory system: Secondary | ICD-10-CM | POA: Insufficient documentation

## 2016-04-21 DIAGNOSIS — M1990 Unspecified osteoarthritis, unspecified site: Secondary | ICD-10-CM | POA: Insufficient documentation

## 2016-04-21 DIAGNOSIS — E785 Hyperlipidemia, unspecified: Secondary | ICD-10-CM | POA: Insufficient documentation

## 2016-04-21 DIAGNOSIS — I2510 Atherosclerotic heart disease of native coronary artery without angina pectoris: Secondary | ICD-10-CM | POA: Insufficient documentation

## 2016-04-21 DIAGNOSIS — Z7901 Long term (current) use of anticoagulants: Secondary | ICD-10-CM | POA: Insufficient documentation

## 2016-04-21 DIAGNOSIS — Z950 Presence of cardiac pacemaker: Secondary | ICD-10-CM | POA: Insufficient documentation

## 2016-04-21 DIAGNOSIS — G5603 Carpal tunnel syndrome, bilateral upper limbs: Secondary | ICD-10-CM | POA: Insufficient documentation

## 2016-04-21 DIAGNOSIS — G4730 Sleep apnea, unspecified: Secondary | ICD-10-CM | POA: Diagnosis not present

## 2016-04-21 DIAGNOSIS — Z96653 Presence of artificial knee joint, bilateral: Secondary | ICD-10-CM | POA: Insufficient documentation

## 2016-04-21 DIAGNOSIS — I429 Cardiomyopathy, unspecified: Secondary | ICD-10-CM | POA: Diagnosis not present

## 2016-04-21 DIAGNOSIS — I10 Essential (primary) hypertension: Secondary | ICD-10-CM | POA: Insufficient documentation

## 2016-04-21 HISTORY — PX: CARPAL TUNNEL RELEASE: SHX101

## 2016-04-21 LAB — PROTIME-INR
INR: 1.15 (ref 0.00–1.49)
PROTHROMBIN TIME: 14.9 s (ref 11.6–15.2)

## 2016-04-21 SURGERY — CARPAL TUNNEL RELEASE
Anesthesia: Monitor Anesthesia Care | Site: Hand | Laterality: Left

## 2016-04-21 MED ORDER — FENTANYL CITRATE (PF) 100 MCG/2ML IJ SOLN
INTRAMUSCULAR | Status: DC | PRN
  Administered 2016-04-21: 50 ug via INTRAVENOUS

## 2016-04-21 MED ORDER — MIDAZOLAM HCL 2 MG/2ML IJ SOLN
INTRAMUSCULAR | Status: AC
  Filled 2016-04-21: qty 2

## 2016-04-21 MED ORDER — LIDOCAINE HCL (PF) 1 % IJ SOLN
INTRAMUSCULAR | Status: AC
  Filled 2016-04-21: qty 30

## 2016-04-21 MED ORDER — HYDROCODONE-ACETAMINOPHEN 5-325 MG PO TABS: 1.0000 | ORAL_TABLET | Freq: Four times a day (QID) | ORAL | Status: DC | PRN

## 2016-04-21 MED ORDER — MIDAZOLAM HCL 5 MG/5ML IJ SOLN
INTRAMUSCULAR | Status: DC | PRN
  Administered 2016-04-21: 0.5 mg via INTRAVENOUS

## 2016-04-21 MED ORDER — LACTATED RINGERS IV SOLN
INTRAVENOUS | Status: DC
  Administered 2016-04-21 (×2): via INTRAVENOUS

## 2016-04-21 MED ORDER — 0.9 % SODIUM CHLORIDE (POUR BTL) OPTIME
TOPICAL | Status: DC | PRN
  Administered 2016-04-21: 1000 mL

## 2016-04-21 MED ORDER — BUPIVACAINE HCL (PF) 0.25 % IJ SOLN
INTRAMUSCULAR | Status: AC
  Filled 2016-04-21: qty 10

## 2016-04-21 MED ORDER — BUPIVACAINE HCL (PF) 0.25 % IJ SOLN
INTRAMUSCULAR | Status: DC | PRN
  Administered 2016-04-21: 30 mL

## 2016-04-21 MED ORDER — FENTANYL CITRATE (PF) 250 MCG/5ML IJ SOLN
INTRAMUSCULAR | Status: AC
  Filled 2016-04-21: qty 5

## 2016-04-21 MED ORDER — LIDOCAINE HCL 1 % IJ SOLN
INTRAMUSCULAR | Status: DC | PRN
  Administered 2016-04-21: 20 mL

## 2016-04-21 MED ORDER — METOPROLOL TARTRATE 25 MG PO TABS
25.0000 mg | ORAL_TABLET | Freq: Once | ORAL | Status: AC
  Administered 2016-04-21: 25 mg via ORAL
  Filled 2016-04-21: qty 2
  Filled 2016-04-21: qty 1

## 2016-04-21 SURGICAL SUPPLY — 46 items
BANDAGE ELASTIC 3 VELCRO ST LF (GAUZE/BANDAGES/DRESSINGS) ×6 IMPLANT
BANDAGE ELASTIC 4 VELCRO ST LF (GAUZE/BANDAGES/DRESSINGS) ×3 IMPLANT
BLADE CARPAL TUNNEL SNGL USE (BLADE) ×2 IMPLANT
BNDG ADH 5X4 AIR PERM ELC (GAUZE/BANDAGES/DRESSINGS) ×1
BNDG COHESIVE 4X5 WHT NS (GAUZE/BANDAGES/DRESSINGS) ×2 IMPLANT
BNDG GAUZE ELAST 4 BULKY (GAUZE/BANDAGES/DRESSINGS) ×5 IMPLANT
CORDS BIPOLAR (ELECTRODE) ×3 IMPLANT
COVER SURGICAL LIGHT HANDLE (MISCELLANEOUS) ×3 IMPLANT
CUFF TOURNIQUET SINGLE 18IN (TOURNIQUET CUFF) ×3 IMPLANT
CUFF TOURNIQUET SINGLE 24IN (TOURNIQUET CUFF) IMPLANT
DRAPE SURG 17X23 STRL (DRAPES) ×3 IMPLANT
DRSG EMULSION OIL 3X3 NADH (GAUZE/BANDAGES/DRESSINGS) ×2 IMPLANT
EVACUATOR 1/8 PVC DRAIN (DRAIN) IMPLANT
GAUZE SPONGE 4X4 12PLY STRL (GAUZE/BANDAGES/DRESSINGS) ×3 IMPLANT
GAUZE XEROFORM 1X8 LF (GAUZE/BANDAGES/DRESSINGS) ×3 IMPLANT
GLOVE BIOGEL M 8.0 STRL (GLOVE) ×3 IMPLANT
GLOVE SS BIOGEL STRL SZ 8 (GLOVE) ×1 IMPLANT
GLOVE SUPERSENSE BIOGEL SZ 8 (GLOVE) ×6
GOWN STRL REUS W/ TWL LRG LVL3 (GOWN DISPOSABLE) ×2 IMPLANT
GOWN STRL REUS W/ TWL XL LVL3 (GOWN DISPOSABLE) ×3 IMPLANT
GOWN STRL REUS W/TWL LRG LVL3 (GOWN DISPOSABLE) ×6
GOWN STRL REUS W/TWL XL LVL3 (GOWN DISPOSABLE) ×9
KIT BASIN OR (CUSTOM PROCEDURE TRAY) ×3 IMPLANT
KIT ROOM TURNOVER OR (KITS) ×3 IMPLANT
LOOP VESSEL MAXI BLUE (MISCELLANEOUS) IMPLANT
NEEDLE HYPO 25GX1X1/2 BEV (NEEDLE) ×3 IMPLANT
NS IRRIG 1000ML POUR BTL (IV SOLUTION) ×3 IMPLANT
PACK ORTHO EXTREMITY (CUSTOM PROCEDURE TRAY) ×3 IMPLANT
PAD ARMBOARD 7.5X6 YLW CONV (MISCELLANEOUS) ×6 IMPLANT
PAD CAST 4YDX4 CTTN HI CHSV (CAST SUPPLIES) ×2 IMPLANT
PADDING CAST COTTON 4X4 STRL (CAST SUPPLIES) ×3
SOLUTION BETADINE 4OZ (MISCELLANEOUS) ×3 IMPLANT
SPONGE SCRUB IODOPHOR (GAUZE/BANDAGES/DRESSINGS) ×3 IMPLANT
SUT PROLENE 4 0 PS 2 18 (SUTURE) ×3 IMPLANT
SUT VIC AB 2-0 CT1 27 (SUTURE) ×3
SUT VIC AB 2-0 CT1 TAPERPNT 27 (SUTURE) IMPLANT
SUT VIC AB 3-0 FS2 27 (SUTURE) ×2 IMPLANT
SYR CONTROL 10ML LL (SYRINGE) IMPLANT
SYSTEM CHEST DRAIN TLS 7FR (DRAIN) IMPLANT
TOWEL OR 17X24 6PK STRL BLUE (TOWEL DISPOSABLE) ×3 IMPLANT
TOWEL OR 17X26 10 PK STRL BLUE (TOWEL DISPOSABLE) ×3 IMPLANT
TUBE CONNECTING 12'X1/4 (SUCTIONS)
TUBE CONNECTING 12X1/4 (SUCTIONS) IMPLANT
TUBE EVACUATION TLS (MISCELLANEOUS) ×3 IMPLANT
UNDERPAD 30X30 INCONTINENT (UNDERPADS AND DIAPERS) ×3 IMPLANT
WATER STERILE IRR 1000ML POUR (IV SOLUTION) ×3 IMPLANT

## 2016-04-21 NOTE — Transfer of Care (Signed)
Immediate Anesthesia Transfer of Care Note  Patient: Gregory Mccormick  Procedure(s) Performed: Procedure(s): CARPAL TUNNEL RELEASE (Left)  Patient Location: PACU  Anesthesia Type:MAC  Level of Consciousness: awake, alert  and oriented  Airway & Oxygen Therapy: Patient Spontanous Breathing  Post-op Assessment: Report given to RN and Post -op Vital signs reviewed and stable  Post vital signs: Reviewed and stable  Last Vitals:  Filed Vitals:   04/21/16 1235  BP: 155/52  Pulse: 67  Temp: 36.7 C  Resp: 18    Last Pain: There were no vitals filed for this visit.       Complications: No apparent anesthesia complications

## 2016-04-21 NOTE — Discharge Instructions (Signed)

## 2016-04-21 NOTE — Op Note (Signed)
See dictation#[id number] GramigMD

## 2016-04-21 NOTE — Anesthesia Preprocedure Evaluation (Signed)
Anesthesia Evaluation  Patient identified by MRN, date of birth, ID band Patient awake    Reviewed: Allergy & Precautions, NPO status , Patient's Chart, lab work & pertinent test results, reviewed documented beta blocker date and time   History of Anesthesia Complications Negative for: history of anesthetic complications  Airway Mallampati: II  TM Distance: >3 FB Neck ROM: Full    Dental  (+) Upper Dentures, Lower Dentures   Pulmonary sleep apnea and Continuous Positive Airway Pressure Ventilation , former smoker (quit 1975),    breath sounds clear to auscultation       Cardiovascular hypertension, Pt. on medications and Pt. on home beta blockers (-) angina+ CAD and + Cardiac Stents  + dysrhythmias Ventricular Tachycardia + pacemaker  Rhythm:Regular Rate:Normal  1/17 ECHO: EF 55-60%, grade 1 diastolic dysfunction, mild TR   Neuro/Psych negative neurological ROS     GI/Hepatic Neg liver ROS, hiatal hernia, neg GERD  ,  Endo/Other  negative endocrine ROS  Renal/GU negative Renal ROS     Musculoskeletal  (+) Arthritis ,   Abdominal   Peds  Hematology  (+) Blood dyscrasia (xarelto), ,   Anesthesia Other Findings   Reproductive/Obstetrics                             Anesthesia Physical Anesthesia Plan  ASA: III  Anesthesia Plan: MAC   Post-op Pain Management:    Induction: Intravenous  Airway Management Planned: Natural Airway and Nasal Cannula  Additional Equipment:   Intra-op Plan:   Post-operative Plan:   Informed Consent: I have reviewed the patients History and Physical, chart, labs and discussed the procedure including the risks, benefits and alternatives for the proposed anesthesia with the patient or authorized representative who has indicated his/her understanding and acceptance.   Dental advisory given  Plan Discussed with: CRNA and Surgeon  Anesthesia Plan Comments:  (Plan routine monitors, MAC)        Anesthesia Quick Evaluation

## 2016-04-21 NOTE — H&P (Signed)
Gregory Mccormick is an 82 y.o. male.   Chief Complaint: Numbness left hand HPI: The patient is very pleasant 82 year old gentleman has a history of bilateral carpal tunnel syndrome. He is present with a right ligament of carpal tunnel release without difficulties. He presents today for a left Limited open carpal tunnel release. We have discussed with him all issues risk and benefits. Patient desires to proceed.  Past Medical History  Diagnosis Date  . Vitamin D deficiency   . PONV (postoperative nausea and vomiting)   . Nonspecific abnormal unspecified cardiovascular function study   . Other primary cardiomyopathies   . Paroxysmal ventricular tachycardia (HCC)   . Pacemaker     MDT  . H/O hiatal hernia   . Arthritis     "right knee" (03/28/2013  . Exertional shortness of breath     "recently" (03/28/2013)  . Anemia, unspecified   . Hyperlipidemia     takes Lipitor daily  . Cardiomyopathy (HCC)     EF 40%  . Carpal tunnel syndrome, bilateral   . OSA on CPAP     wears CPAP 02/03/16  . Essential hypertension, benign     takes Metoprolol daily  . Carpal tunnel syndrome   . Peripheral edema     takes Lasix daily  . CAD in native artery     takes Xarelto daily  . Joint pain   . History of kidney stones   . Nocturia     Past Surgical History  Procedure Laterality Date  . Total knee arthroplasty Right 2007  . Knee cartilage surgery Left 1972    "cut it open" (03/28/2013)  . Shoulder surgery Right 1970's    "pulled muscle apart; sewed it back together" (03/28/2013  . Total knee arthroplasty  11/26/2012    Procedure: TOTAL KNEE ARTHROPLASTY;  Surgeon: Loanne Drilling, MD;  Location: [facility] ORS;  Service: Orthopedics;  Laterality: Left;  . Cardiac catheterization  03/21/2013  . Coronary angioplasty with stent placement  03/28/2013    "3 stents" (03/28/2013)  . Knee cartilage surgery Right ~ 1977    "cut it open" (03/28/2013)  . Shoulder hemi-arthroplasty Right 1987    "got hit by sled &  dragged down sheet > 200 feet; tore shoulder up" (03/28/2013)  . Cataract extraction w/ intraocular lens  implant, bilateral Bilateral 2000's  . Pacemaker insertion  10/05/10    MDT Adapta L implanted by Dr [name] for mobitz II second degree AV block  . Percutaneous coronary rotoblator intervention (pci-r) N/A 03/28/2013    Procedure: PERCUTANEOUS CORONARY ROTOBLATOR INTERVENTION (PCI-R);  Surgeon: Corky Crafts, MD;  Location: [facility] CATH LAB;  Service: Cardiovascular;  Laterality: N/A;  . Colonoscopy    . Septoplasty N/A 02/05/2016    Procedure: SEPTOPLASTY WITH INTRANASAL SKIN GRAFT;  Surgeon: Drema Halon, MD;  Location: [facility] OR;  Service: ENT;  Laterality: N/A;  . Carpal tunnel release Right 02/05/2016    Procedure: RIGHT LIMITED OPEN CARPAL TUNNEL RELEASE;  Surgeon: Dominica Severin, MD;  Location: [facility] OR;  Service: Orthopedics;  Laterality: Right;  . Insert / replace / remove pacemaker      Family History  Problem Relation Age of Onset  . Cancer    . Heart disease    . CAD Brother 68  . Heart attack Neg Hx   . Hypertension Neg Hx   . Stroke Brother    Social History:  reports that he quit smoking about 42 years ago. His smoking use included Cigarettes.  He has a 26 pack-year smoking history. He has never used smokeless tobacco. He reports that he does not drink alcohol or use illicit drugs.  Allergies:  Allergies  Allergen Reactions  . Penicillins Hives    Has patient had a PCN reaction causing immediate rash, facial/tongue/throat swelling, SOB or lightheadedness with hypotension: YES Has patient had a PCN reaction causing severe rash involving mucus membranes or skin necrosis: No Has patient had a PCN reaction that required hospitalization No Has patient had a PCN reaction occurring within the last 10 years: No If all of the above answers are "NO", then may proceed with Cephalosporin use.   Marland Kitchen Hydromorphone Hcl Nausea And Vomiting    Medications Prior to Admission   Medication Sig Dispense Refill  . acetaminophen (TYLENOL) 500 MG tablet Take 1,000 mg by mouth at bedtime as needed for mild pain. For leg pain.    Marland Kitchen atorvastatin (LIPITOR) 10 MG tablet TAKE 1 TABLET (10 MG TOTAL) BY MOUTH DAILY AT 6 PM. 30 tablet 10  . cholecalciferol (VITAMIN D) 1000 UNITS tablet Take 5,000 Units by mouth daily.     . furosemide (LASIX) 40 MG tablet TAKE 1 TABLET (40 MG TOTAL) BY MOUTH DAILY. 30 tablet 7  . metoprolol tartrate (LOPRESSOR) 25 MG tablet TAKE 1 TABLET (25 MG TOTAL) BY MOUTH 2 (TWO) TIMES DAILY. 60 tablet 11  . Misc Natural Products (OSTEO BI-FLEX ADV DOUBLE ST PO) Take 1 tablet by mouth daily.    . Multiple Vitamin (MULTIVITAMIN) tablet Take 1 tablet by mouth daily.    . Omega-3 Fatty Acids (FISH OIL PO) Take 1 tablet by mouth daily.    . rivaroxaban (XARELTO) 20 MG TABS tablet Take 1 tablet (20 mg total) by mouth daily with supper. 30 tablet 6    Results for orders placed or performed during the hospital encounter of 04/21/16 (from the past 48 hour(s))  Protime-INR     Status: None   Collection Time: 04/21/16 12:34 PM  Result Value Ref Range   Prothrombin Time 14.9 11.6 - 15.2 seconds   INR 1.15 0.00 - 1.49   No results found.  Review of Systems  Constitutional: Negative.   HENT: Negative.   Eyes: Negative.   Cardiovascular: Negative.        See past medical history  Gastrointestinal: Negative.   Musculoskeletal:        see history of the present illness  Skin: Negative.   Endo/Heme/Allergies: Does not bruise/bleed easily.    Blood pressure 155/52, pulse 67, temperature 98.1 F (36.7 C), temperature source Oral, resp. rate 18, weight 92.08 kg (203 lb), SpO2 98 %. Physical Exam  The patient is alert and oriented in no acute distress. The patient complains of pain in the affected upper extremity.  The patient is noted to have a normal HEENT exam. Lung fields show equal chest expansion and no shortness of breath. Abdomen exam is nontender  without distention. Lower extremity examination does not show any fracture dislocation or blood clot symptoms. Pelvis is stable and the neck and back are stable and nontender. Patient remained positive with provocative testing for left carpal tunnel syndrome. Assessment/Plan Left carpal tunnel syndrome Patient Active Problem List   Diagnosis Date Noted  . Atrial fibrillation (HCC) 01/25/2016  . Hyperlipidemia 10/06/2015  . Coronary atherosclerosis of native coronary artery 02/13/2014  . Chronic systolic dysfunction of left ventricle 12/05/2013  . Nonspecific abnormal unspecified cardiovascular function study   . Other primary cardiomyopathies   .  Paroxysmal ventricular tachycardia (HCC)   . Postop Acute blood loss anemia 11/27/2012  . Postop Hyponatremia 11/27/2012  . OA (osteoarthritis) of knee 11/26/2012  . Mobitz type II atrioventricular block 01/24/2011  . ESSENTIAL HYPERTENSION, BENIGN 09/27/2010  . ATRIOVENTRICULAR BLOCK, 2ND DEGREE 09/27/2010  . SYNCOPE 09/27/2010  We are planning surgery for your upper extremity. The risk and benefits of surgery to include risk of bleeding, infection, anesthesia,  damage to normal structures and failure of the surgery to accomplish its intended goals of relieving symptoms and restoring function have been discussed in detail. With this in mind we plan to proceed. I have specifically discussed with the patient the pre-and postoperative regime and the dos and don'ts and risk and benefits in great detail. Risk and benefits of surgery also include risk of dystrophy(CRPS), chronic nerve pain, failure of the healing process to go onto completion and other inherent risks of surgery The relavent the pathophysiology of the disease/injury process, as well as the alternatives for treatment and postoperative course of action has been discussed in great detail with the patient who desires to proceed.  We will do everything in our power to help you (the patient)  restore function to the upper extremity. It is a pleasure to see this patient today.   Glendell Schlottman L, PA-C 04/21/2016, 2:50 PM

## 2016-04-22 ENCOUNTER — Encounter (HOSPITAL_COMMUNITY): Payer: Self-pay | Admitting: Orthopedic Surgery

## 2016-04-22 NOTE — Op Note (Signed)
NAME:  DRILON, USSERY NO.:  [id number]  MEDICAL RECORD NO.:  [id number]  LOCATION:                                 FACILITY:  PHYSICIAN:  Dionne Ano. Breana Litts, M.D.DATE OF BIRTH:  07/05/1929  DATE OF PROCEDURE: DATE OF DISCHARGE:                              OPERATIVE REPORT   PREOPERATIVE DIAGNOSIS:  Left carpal tunnel syndrome.  POSTOPERATIVE DIAGNOSIS:  Left carpal tunnel syndrome.  PROCEDURE: 1. Left median nerve/peripheral nerve block at the wrist-forearm level     for anesthetic purposes for carpal tunnel release. 2. Left limited open carpal tunnel release.  SURGEON:  Dionne Ano. Amanda Pea, M.D.  ASSISTANT:  Karie Chimera, PA-C.  COMPLICATIONS:  None.  ANESTHESIA:  Peripheral nerve block with IV sedation keeping the patient awake, alert, and oriented the entire case.  TOURNIQUET TIME:  Less than 15 minutes.  INDICATIONS:  An 82 year old male presents with the above-mentioned diagnosis.  I have counseled him in regard to risks and benefits of surgery and we will plan for operative release.  OPERATIVE PROCEDURE:  The patient was seen by myself and Anesthesia. Taken to the operative suite, underwent smooth induction of peripheral nerve/median nerve block with lidocaine, Sensorcaine with epinephrine. Prepped and draped in usual sterile fashion with Betadine scrub and paint.  Time-out called.  Incision made under 250 mmHg tourniquet control, 2 cm incision was made.  Dissection was carried down.  Palmar fascia incised.  Distal transverse carpal ligament released under 4.0 loupe magnification.  Fat pad egressed nicely.  Superficial palmar arch identified, carefully protected, full release confirmed followed by distal and proximal dissection until canal prepared.  Two other vessels placed just under the proximal leading leaflet of the transverse carpal ligament.  The patient tolerated this well.  There were no complicating features.  Once this was  complete, security clip was placed, obturator disengaged and security knife was placed and security clip effectively releasing the proximal leaflet of transverse carpal ligament.  Once this was complete, I then identified the canal, it was completely released proximally, all looked well.  It was irrigated, wound lavaged, hemostasis secured, and suture of the 4 variety was used to close the wound in a horizontal mattress fashion to slightly evert the skin.  He was dressed sterilely.  He will see [place] back in a week.  Therapy in 12 days.  Notify us should any problems occur.  These notes were discussed and all questions have been encouraged and answered.  This is an uncomplicated left carpal tunnel release.  He was awake, alert, and oriented during the entire case and had no problems.     Dionne Ano. Amanda Pea, M.D.   ______________________________ Dionne Ano. Amanda Pea, M.D.    [facility]  D:  04/21/2016  T:  04/21/2016  Job:  [id number]

## 2016-04-22 NOTE — OR Nursing (Signed)
Addendum created ---pt went straight to phase II; he did not go into Phase I at all. I eliminated In Recovery Time.

## 2016-04-22 NOTE — Anesthesia Postprocedure Evaluation (Signed)
Anesthesia Post Note  Patient: Gregory Mccormick  Procedure(s) Performed: Procedure(s) (LRB): CARPAL TUNNEL RELEASE (Left)  Patient location during evaluation: PACU Anesthesia Type: MAC Level of consciousness: awake and alert, oriented and patient cooperative Pain management: pain level controlled Vital Signs Assessment: post-procedure vital signs reviewed and stable Respiratory status: spontaneous breathing, nonlabored ventilation and respiratory function stable Cardiovascular status: blood pressure returned to baseline Postop Assessment: no signs of nausea or vomiting Anesthetic complications: no Comments: Delayed entry, pt eval in PACU    Last Vitals:  Filed Vitals:   04/21/16 1612 04/21/16 1628  BP: 161/90 158/98  Pulse: 65   Temp:    Resp:      Last Pain: There were no vitals filed for this visit.               Germaine Pomfret

## 2016-05-12 ENCOUNTER — Ambulatory Visit (INDEPENDENT_AMBULATORY_CARE_PROVIDER_SITE_OTHER): Payer: Medicare Other | Admitting: *Deleted

## 2016-05-12 ENCOUNTER — Telehealth: Payer: Self-pay | Admitting: Cardiology

## 2016-05-12 DIAGNOSIS — I441 Atrioventricular block, second degree: Secondary | ICD-10-CM | POA: Diagnosis not present

## 2016-05-12 NOTE — Telephone Encounter (Signed)
Confirmed remote transmission w/ pt wife.   

## 2016-05-13 NOTE — Progress Notes (Signed)
Remote pacemaker transmission.   

## 2016-05-17 LAB — CUP PACEART REMOTE DEVICE CHECK
Battery Remaining Longevity: 109 mo
Date Time Interrogation Session: 20170713193222
Implantable Lead Implant Date: 20111206
Implantable Lead Model: 5092
Lead Channel Pacing Threshold Amplitude: 0.75 V
Lead Channel Pacing Threshold Amplitude: 0.875 V
Lead Channel Pacing Threshold Pulse Width: 0.4 ms
Lead Channel Setting Pacing Amplitude: 2.5 V
Lead Channel Setting Pacing Pulse Width: 0.4 ms
Lead Channel Setting Sensing Sensitivity: 5.6 mV
MDC IDC LEAD IMPLANT DT: 20111206
MDC IDC LEAD LOCATION: 753859
MDC IDC LEAD LOCATION: 753860
MDC IDC MSMT BATTERY IMPEDANCE: 324 Ohm
MDC IDC MSMT BATTERY VOLTAGE: 2.78 V
MDC IDC MSMT LEADCHNL RA IMPEDANCE VALUE: 418 Ohm
MDC IDC MSMT LEADCHNL RA PACING THRESHOLD PULSEWIDTH: 0.4 ms
MDC IDC MSMT LEADCHNL RA SENSING INTR AMPL: 1.4 mV
MDC IDC MSMT LEADCHNL RV IMPEDANCE VALUE: 719 Ohm
MDC IDC MSMT LEADCHNL RV SENSING INTR AMPL: 11.2 mV
MDC IDC SET LEADCHNL RA PACING AMPLITUDE: 2 V
MDC IDC STAT BRADY AP VP PERCENT: 6 %
MDC IDC STAT BRADY AP VS PERCENT: 35 %
MDC IDC STAT BRADY AS VP PERCENT: 11 %
MDC IDC STAT BRADY AS VS PERCENT: 48 %

## 2016-05-18 ENCOUNTER — Encounter: Payer: Self-pay | Admitting: Cardiology

## 2016-06-02 ENCOUNTER — Encounter: Payer: Medicare Other | Admitting: Physician Assistant

## 2016-06-02 DIAGNOSIS — R0989 Other specified symptoms and signs involving the circulatory and respiratory systems: Secondary | ICD-10-CM

## 2016-06-02 NOTE — Progress Notes (Deleted)
Cardiology Office Note Date:  06/02/2016  Patient ID:  Gregory Mccormick, Gregory Mccormick 1929-03-02, MRN [id number] PCP:  Sissy Hoff, MD  Cardiologist:  Dr. Eldridge Dace Electrophysiologist: Dr. [name]  ***refresh   Chief Complaint: device visit  History of Present Illness: Gregory Mccormick is a 82 y.o. male with history of recent carpel tunnel sx surgery 04/21/16, on 02/05/16 surery for recurrent epistaxis, heart block w/PP, ICM, CAD with PCI 2014, HTN, PAFib, OSA intolerant of CPAP, HLD.  He comes to the office today to be seen for Dr. [name].  Last seen by him in Oct. 2016, at that time doing well.  He reports ***  DEVICE:  MDT PPM for Mobitz II/syncope, Dec 2011, Dr. [name]  *** bleeding??? *** epistaxis, ENT OR 02/05/16 *** symptoms/syncope *** ? Watchman/cooper if ongoing issues    Past Medical History:  Diagnosis Date  . Anemia, unspecified   . Arthritis    "right knee" (03/28/2013  . CAD in native artery    takes Xarelto daily  . Cardiomyopathy (HCC)    EF 40%  . Carpal tunnel syndrome   . Carpal tunnel syndrome, bilateral   . Essential hypertension, benign    takes Metoprolol daily  . Exertional shortness of breath    "recently" (03/28/2013)  . H/O hiatal hernia   . History of kidney stones   . Hyperlipidemia    takes Lipitor daily  . Joint pain   . Nocturia   . Nonspecific abnormal unspecified cardiovascular function study   . OSA on CPAP    wears CPAP 02/03/16  . Other primary cardiomyopathies   . Pacemaker    MDT  . Paroxysmal ventricular tachycardia (HCC)   . Peripheral edema    takes Lasix daily  . PONV (postoperative nausea and vomiting)   . Vitamin D deficiency     Past Surgical History:  Procedure Laterality Date  . CARDIAC CATHETERIZATION  03/21/2013  . CARPAL TUNNEL RELEASE Right 02/05/2016   Procedure: RIGHT LIMITED OPEN CARPAL TUNNEL RELEASE;  Surgeon: Dominica Severin, MD;  Location: [facility] OR;  Service: Orthopedics;  Laterality: Right;  . CARPAL TUNNEL RELEASE  Left 04/21/2016   Procedure: CARPAL TUNNEL RELEASE;  Surgeon: Dominica Severin, MD;  Location: [facility] OR;  Service: Orthopedics;  Laterality: Left;  . CATARACT EXTRACTION W/ INTRAOCULAR LENS  IMPLANT, BILATERAL Bilateral 2000's  . COLONOSCOPY    . CORONARY ANGIOPLASTY WITH STENT PLACEMENT  03/28/2013   "3 stents" (03/28/2013)  . INSERT / REPLACE / REMOVE PACEMAKER    . KNEE CARTILAGE SURGERY Left 1972   "cut it open" (03/28/2013)  . KNEE CARTILAGE SURGERY Right ~ 1977   "cut it open" (03/28/2013)  . PACEMAKER INSERTION  10/05/10   MDT Adapta L implanted by Dr [name] for mobitz II second degree AV block  . PERCUTANEOUS CORONARY ROTOBLATOR INTERVENTION (PCI-R) N/A 03/28/2013   Procedure: PERCUTANEOUS CORONARY ROTOBLATOR INTERVENTION (PCI-R);  Surgeon: Corky Crafts, MD;  Location: [facility] CATH LAB;  Service: Cardiovascular;  Laterality: N/A;  . SEPTOPLASTY N/A 02/05/2016   Procedure: SEPTOPLASTY WITH INTRANASAL SKIN GRAFT;  Surgeon: Drema Halon, MD;  Location: [facility] OR;  Service: ENT;  Laterality: N/A;  . SHOULDER HEMI-ARTHROPLASTY Right 1987   "got hit by sled & dragged down sheet > 200 feet; tore shoulder up" (03/28/2013)  . SHOULDER SURGERY Right 1970's   "pulled muscle apart; sewed it back together" (03/28/2013  . TOTAL KNEE ARTHROPLASTY Right 2007  . TOTAL KNEE ARTHROPLASTY  11/26/2012   Procedure: TOTAL  KNEE ARTHROPLASTY;  Surgeon: Loanne Drilling, MD;  Location: [facility] ORS;  Service: Orthopedics;  Laterality: Left;    Current Outpatient Prescriptions  Medication Sig Dispense Refill  . acetaminophen (TYLENOL) 500 MG tablet Take 1,000 mg by mouth at bedtime as needed for mild pain. For leg pain.    Marland Kitchen atorvastatin (LIPITOR) 10 MG tablet TAKE 1 TABLET (10 MG TOTAL) BY MOUTH DAILY AT 6 PM. 30 tablet 10  . cholecalciferol (VITAMIN D) 1000 UNITS tablet Take 5,000 Units by mouth daily.     . furosemide (LASIX) 40 MG tablet TAKE 1 TABLET (40 MG TOTAL) BY MOUTH DAILY. 30 tablet 7  .  HYDROcodone-acetaminophen (NORCO) 5-325 MG tablet Take 1 tablet by mouth every 6 (six) hours as needed for moderate pain. 30 tablet 0  . metoprolol tartrate (LOPRESSOR) 25 MG tablet TAKE 1 TABLET (25 MG TOTAL) BY MOUTH 2 (TWO) TIMES DAILY. 60 tablet 11  . Misc Natural Products (OSTEO BI-FLEX ADV DOUBLE ST PO) Take 1 tablet by mouth daily.    . Multiple Vitamin (MULTIVITAMIN) tablet Take 1 tablet by mouth daily.    . Omega-3 Fatty Acids (FISH OIL PO) Take 1 tablet by mouth daily.    . rivaroxaban (XARELTO) 20 MG TABS tablet Take 1 tablet (20 mg total) by mouth daily with supper. 30 tablet 6   No current facility-administered medications for this visit.     Allergies:   Penicillins and Hydromorphone hcl   Social History:  The patient  reports that he quit smoking about 42 years ago. His smoking use included Cigarettes. He has a 26.00 pack-year smoking history. He has never used smokeless tobacco. He reports that he does not drink alcohol or use drugs.   Family History:  The patient's family history includes CAD (age of onset: 70) in his brother; Stroke in his brother.  ROS:  Please see the history of present illness.  All other systems are reviewed and otherwise negative.   PHYSICAL EXAM: *** VS:  There were no vitals taken for this visit. BMI: There is no height or weight on file to calculate BMI. Well nourished, well developed, in no acute distress  HEENT: normocephalic, atraumatic  Neck: no JVD, carotid bruits or masses Cardiac:  RRR; no significant murmurs, no rubs, or gallops Lungs:  clear to auscultation bilaterally, no wheezing, rhonchi or rales  Abd: soft, nontender MS: no deformity or atrophy Ext: no edema  Skin: warm and dry, no rash Neuro:  No gross deficits appreciated Psych: euthymic mood, full affect  *** PPM site is stable, no tethering or discomfort   EKG:  Done today shows *** PPM interrogation today: ***  11/25/15: Echocardiogram Study Conclusions - Left  ventricle: The cavity size was normal. Wall thickness was   increased in a pattern of mild LVH. Systolic function was normal.   The estimated ejection fraction was in the range of 55% to 60%.   Wall motion was normal; there were no regional wall motion   abnormalities. Doppler parameters are consistent with abnormal   left ventricular relaxation (grade 1 diastolic dysfunction). The   E/e&' ratio is between 8-15, suggesting indeterminate LV filling   pressure. - Aortic valve: Trileaflet; mildly calcified leaflets. Mild   stenosis. Mean gradient (S): 10 mm Hg. Peak gradient (S): 18 mm   Hg. Valve area (VTI): 1.76 cm^2. Valve area (Vmax): 1.71 cm^2.   Valve area (Vmean): 1.84 cm^2. - Mitral valve: Mildly thickened leaflets . There was trivial  regurgitation. - Right ventricle: The cavity size was mildly dilated. Wall   thickness was normal. The moderator band was prominent. Pacer   wire or catheter noted in right ventricle. Systolic function was   normal. - Right atrium: The atrium was mildly dilated. Pacer wire or   catheter noted in right atrium. - Tricuspid valve: There was mild regurgitation. - Pulmonary arteries: PA peak pressure: 26 mm Hg (S). - Inferior vena cava: The vessel was normal in size. The   respirophasic diameter changes were in the normal range (= 50%),   consistent with normal central venous pressure. Impressions: - Compared to a prior study in 2015, there are no significant   interval changes.  Recent Labs: 04/13/2016: BUN 12; Creatinine, Ser 1.15; Hemoglobin 13.8; Platelets 185; Potassium 4.0; Sodium 141  No results found for requested labs within last 8760 hours.   CrCl cannot be calculated (Unknown ideal weight.).   Wt Readings from Last 3 Encounters:  04/21/16 203 lb (92.1 kg)  04/13/16 203 lb 3 oz (92.2 kg)  02/05/16 210 lb 3 oz (95.3 kg)     Other studies reviewed: Additional studies/records reviewed today include: summarized above  ASSESSMENT AND  PLAN:  1. Mobitz II second degree AV block *** Normal pacemaker function See Pace Art report No changes today  2. Ischemic CM with improved EF/ CAD *** No ischemic symptoms *** fluid status stable On statin, BB, no ASA with OAC  3. AFib ***No symptoms Well controlled off of AAD therapy chads2vasc score is at least 5 *** Tolerating xarelto 04/13/16: Calc Cr. Cl is 63, H/H 13/43  4. HTN *** Stable No change required today  Disposition: F/u with ***  Current medicines are reviewed at length with the patient today.  The patient did not have any concerns regarding medicines.  Judith Blonder, PA-C 06/02/2016 5:43 AM     [facility] [address] [place] [address] [phone] (office)  [phone] (fax)

## 2016-06-08 ENCOUNTER — Encounter: Payer: Self-pay | Admitting: Physician Assistant

## 2016-07-05 NOTE — Progress Notes (Signed)
Cardiology Office Note Date:  07/06/2016  Patient ID:  Gregory Mccormick, DOB 1929-08-28, MRN [id number] PCP:  Sissy Hoff, MD  Cardiologist:  Dr. Eldridge Dace Electrophysiologist: Dr. [name]   Chief Complaint:  Routine visit  History of Present Illness: Gregory Mccormick is a 82 y.o. male with history of CAD (last intervention 2014), OSA he reports intolerant of CPAP, obesity, HTN, HLD, hx of Mobtitz II heart block s/p PPM, ICM with improved EF.   He comes in today to be seen for Dr. [name], last seen by him in March, at that time doing well, without recurrent epistaxis.  He has had b/l carpel tunnel surgery and nose surgery since his last visit without complications   He continues to feel very well, walks his dog 4x daily sometimes more walking 2-4 blocks without changes in his exertional capacity, no CP SOB, no palpitations, no dizziness, near syncope or syncope.   PMHX mentions anemia, blood in stool and hemorrhoids that he reports was many years ago, none in >5 years or so. CHADS2Vasc is at least 4, Rx Xarelto   He denies any recurrent nose bleeds or any bleeding, signs of bleeding   DEVICE:  MDT PPM for Mobitz II/syncope, Dec 2011, Dr. [name]   Past Medical History:  Diagnosis Date  . Anemia, unspecified   . Arthritis    "right knee" (03/28/2013  . CAD in native artery    takes Xarelto daily  . Cardiomyopathy (HCC)    EF 40%  . Carpal tunnel syndrome   . Carpal tunnel syndrome, bilateral   . Essential hypertension, benign    takes Metoprolol daily  . Exertional shortness of breath    "recently" (03/28/2013)  . H/O hiatal hernia   . History of kidney stones   . Hyperlipidemia    takes Lipitor daily  . Joint pain   . Nocturia   . Nonspecific abnormal unspecified cardiovascular function study   . OSA on CPAP    wears CPAP 02/03/16  . Other primary cardiomyopathies   . Pacemaker    MDT  . Paroxysmal ventricular tachycardia (HCC)   . Peripheral edema    takes Lasix  daily  . PONV (postoperative nausea and vomiting)   . Vitamin D deficiency     Past Surgical History:  Procedure Laterality Date  . CARDIAC CATHETERIZATION  03/21/2013  . CARPAL TUNNEL RELEASE Right 02/05/2016   Procedure: RIGHT LIMITED OPEN CARPAL TUNNEL RELEASE;  Surgeon: Dominica Severin, MD;  Location: [facility] OR;  Service: Orthopedics;  Laterality: Right;  . CARPAL TUNNEL RELEASE Left 04/21/2016   Procedure: CARPAL TUNNEL RELEASE;  Surgeon: Dominica Severin, MD;  Location: [facility] OR;  Service: Orthopedics;  Laterality: Left;  . CATARACT EXTRACTION W/ INTRAOCULAR LENS  IMPLANT, BILATERAL Bilateral 2000's  . COLONOSCOPY    . CORONARY ANGIOPLASTY WITH STENT PLACEMENT  03/28/2013   "3 stents" (03/28/2013)  . INSERT / REPLACE / REMOVE PACEMAKER    . KNEE CARTILAGE SURGERY Left 1972   "cut it open" (03/28/2013)  . KNEE CARTILAGE SURGERY Right ~ 1977   "cut it open" (03/28/2013)  . PACEMAKER INSERTION  10/05/10   MDT Adapta L implanted by Dr [name] for mobitz II second degree AV block  . PERCUTANEOUS CORONARY ROTOBLATOR INTERVENTION (PCI-R) N/A 03/28/2013   Procedure: PERCUTANEOUS CORONARY ROTOBLATOR INTERVENTION (PCI-R);  Surgeon: Corky Crafts, MD;  Location: [facility] CATH LAB;  Service: Cardiovascular;  Laterality: N/A;  . SEPTOPLASTY N/A 02/05/2016   Procedure: SEPTOPLASTY WITH INTRANASAL SKIN GRAFT;  Surgeon: Drema Halon, MD;  Location: [facility] OR;  Service: ENT;  Laterality: N/A;  . SHOULDER HEMI-ARTHROPLASTY Right 1987   "got hit by sled & dragged down sheet > 200 feet; tore shoulder up" (03/28/2013)  . SHOULDER SURGERY Right 1970's   "pulled muscle apart; sewed it back together" (03/28/2013  . TOTAL KNEE ARTHROPLASTY Right 2007  . TOTAL KNEE ARTHROPLASTY  11/26/2012   Procedure: TOTAL KNEE ARTHROPLASTY;  Surgeon: Loanne Drilling, MD;  Location: [facility] ORS;  Service: Orthopedics;  Laterality: Left;    Current Outpatient Prescriptions  Medication Sig Dispense Refill  . acetaminophen (TYLENOL) 500 MG  tablet Take 1,000 mg by mouth at bedtime as needed for mild pain. For leg pain.    Marland Kitchen atorvastatin (LIPITOR) 10 MG tablet TAKE 1 TABLET (10 MG TOTAL) BY MOUTH DAILY AT 6 PM. 30 tablet 10  . cholecalciferol (VITAMIN D) 1000 UNITS tablet Take 5,000 Units by mouth daily.     . furosemide (LASIX) 40 MG tablet TAKE 1 TABLET (40 MG TOTAL) BY MOUTH DAILY. 30 tablet 7  . HYDROcodone-acetaminophen (NORCO) 5-325 MG tablet Take 1 tablet by mouth every 6 (six) hours as needed for moderate pain. 30 tablet 0  . metoprolol tartrate (LOPRESSOR) 25 MG tablet TAKE 1 TABLET (25 MG TOTAL) BY MOUTH 2 (TWO) TIMES DAILY. 60 tablet 11  . Misc Natural Products (OSTEO BI-FLEX ADV DOUBLE ST PO) Take 1 tablet by mouth daily.    . Multiple Vitamin (MULTIVITAMIN) tablet Take 1 tablet by mouth daily.    . Omega-3 Fatty Acids (FISH OIL PO) Take 1 tablet by mouth daily.    . rivaroxaban (XARELTO) 20 MG TABS tablet Take 1 tablet (20 mg total) by mouth daily with supper. 30 tablet 6   No current facility-administered medications for this visit.     Allergies:   Penicillins and Hydromorphone hcl   Social History:  The patient  reports that he quit smoking about 42 years ago. His smoking use included Cigarettes. He has a 26.00 pack-year smoking history. He has never used smokeless tobacco. He reports that he does not drink alcohol or use drugs.   Family History:  The patient's family history includes CAD (age of onset: 45) in his brother; Stroke in his brother.  ROS:  Please see the history of present illness. All other systems are reviewed and otherwise negative.   PHYSICAL EXAM:  VS:  BP 124/74   Pulse 76   Ht 5\' 9"  (1.753 m)   Wt 204 lb (92.5 kg)   BMI 30.13 kg/m  BMI: Body mass index is 30.13 kg/m. Well nourished, well developed, in no acute distress  HEENT: normocephalic, atraumatic  Neck: no JVD, carotid bruits or masses Cardiac:  RRR; no significant murmurs, no rubs, or gallops Lungs:  clear to auscultation  bilaterally, no wheezing, rhonchi or rales  Abd: soft, nontender MS: no deformity or atrophy Ext: no edema  Skin: warm and dry, no rash Neuro:  No gross deficits appreciated Psych: euthymic mood, full affect  PPM site is stable, no tethering or discomfort   EKG:  Done 10/14/15 shows A pacing, V sensed PPM check today shows normal function, leads and battery status is god, underlying is SR, one 14beat NSVT episode this morning  11/25/15 Echocardiogram Study Conclusions - Left ventricle: The cavity size was normal. Wall thickness was   increased in a pattern of mild LVH. Systolic function was normal.   The estimated ejection fraction was in the range  of 55% to 60%.   Wall motion was normal; there were no regional wall motion   abnormalities. Doppler parameters are consistent with abnormal   left ventricular relaxation (grade 1 diastolic dysfunction). The   E/e&' ratio is between 8-15, suggesting indeterminate LV filling   pressure. - Aortic valve: Trileaflet; mildly calcified leaflets. Mild   stenosis. Mean gradient (S): 10 mm Hg. Peak gradient (S): 18 mm   Hg. Valve area (VTI): 1.76 cm^2. Valve area (Vmax): 1.71 cm^2.   Valve area (Vmean): 1.84 cm^2. - Mitral valve: Mildly thickened leaflets . There was trivial   regurgitation. - Right ventricle: The cavity size was mildly dilated. Wall   thickness was normal. The moderator band was prominent. Pacer   wire or catheter noted in right ventricle. Systolic function was   normal. - Right atrium: The atrium was mildly dilated. Pacer wire or   catheter noted in right atrium. - Tricuspid valve: There was mild regurgitation. - Pulmonary arteries: PA peak pressure: 26 mm Hg (S). - Inferior vena cava: The vessel was normal in size. The   respirophasic diameter changes were in the normal range (= 50%),   consistent with normal central venous pressure. Impressions - Compared to a prior study in 2015, there are no significant   interval  changes.  LA 33mm  08/18/14 Echocardiogram Study Conclusions - Left ventricle: The cavity size was normal. Systolic function was normal. The estimated ejection fraction was in the range of 55% to 60%. Wall motion was normal; there were no regional wall motion abnormalities. Doppler parameters are consistent with abnormal left ventricular relaxation (grade 1 diastolic dysfunction). - Aortic valve: Transvalvular velocity was minimally increased. There was very mild stenosis. Mean gradient (S): 11 mm Hg. - Mitral valve: Calcified annulus. There was mild regurgitation. - Pulmonary arteries: Systolic pressure was mildly increased. PA peak pressure: 37 mm Hg (S). Impressions: - Compared to the prior study, there has been no significant interval change.  LA 40mm  Recent Labs: 10/12/15 BUN/Creat 12/1.11, H/H 14/42, plts 177, LFTs, TSH 1.89 wnl (HDL 54, LDL 78, Trigs 105) Calc Creat. Cl. = 66.43  Wt Readings from Last 3 Encounters:  07/06/16 204 lb (92.5 kg)  04/21/16 203 lb (92.1 kg)  04/13/16 203 lb 3 oz (92.2 kg)     Other studies reviewed: Additional studies/records reviewed today include: summarized above    ASSESSMENT AND PLAN:  1. Mobitz II with PPM     Continue Q 3 month [facility] transmissions  2. New PAFib     CHADS2Vasc is at least 4     Xarelto     lasb stable at last check  3 CAD    No CP    Last intervention was in 2014    On BB, statin, plavix discontinued with the addition of Xarelto  4 HTN    Appears well controlled  5. Brief NSVT this morning noted on his device check     No symptoms     LVEF nromal     K+, mag   Disposition: 3 month remote check, 6 months with Dr. Johney FrameAllred  Current medicines are reviewed at length with the patient today.  The patient did not have any concerns regarding medicines.    Judith BlonderSigned, Cresta Riden Ursy, PA-C 07/06/2016 4:47 PM     [facility] [address] [place] [address] [phone]  [phone] (office)  [phone] (fax)

## 2016-07-06 ENCOUNTER — Ambulatory Visit (INDEPENDENT_AMBULATORY_CARE_PROVIDER_SITE_OTHER): Payer: Medicare Other | Admitting: Physician Assistant

## 2016-07-06 ENCOUNTER — Encounter: Payer: Self-pay | Admitting: Physician Assistant

## 2016-07-06 VITALS — BP 124/74 | HR 76 | Ht 69.0 in | Wt 204.0 lb

## 2016-07-06 DIAGNOSIS — I4430 Unspecified atrioventricular block: Secondary | ICD-10-CM

## 2016-07-06 DIAGNOSIS — I519 Heart disease, unspecified: Secondary | ICD-10-CM | POA: Diagnosis not present

## 2016-07-06 DIAGNOSIS — I480 Paroxysmal atrial fibrillation: Secondary | ICD-10-CM | POA: Diagnosis not present

## 2016-07-06 DIAGNOSIS — I2510 Atherosclerotic heart disease of native coronary artery without angina pectoris: Secondary | ICD-10-CM

## 2016-07-06 DIAGNOSIS — I472 Ventricular tachycardia, unspecified: Secondary | ICD-10-CM

## 2016-07-06 DIAGNOSIS — I10 Essential (primary) hypertension: Secondary | ICD-10-CM | POA: Diagnosis not present

## 2016-07-06 LAB — BASIC METABOLIC PANEL
BUN: 15 mg/dL (ref 7–25)
CALCIUM: 9.3 mg/dL (ref 8.6–10.3)
CO2: 28 mmol/L (ref 20–31)
Chloride: 103 mmol/L (ref 98–110)
Creat: 1.06 mg/dL (ref 0.70–1.11)
GLUCOSE: 82 mg/dL (ref 65–99)
POTASSIUM: 4.3 mmol/L (ref 3.5–5.3)
SODIUM: 141 mmol/L (ref 135–146)

## 2016-07-06 LAB — MAGNESIUM: MAGNESIUM: 2.2 mg/dL (ref 1.5–2.5)

## 2016-07-06 NOTE — Patient Instructions (Addendum)
Medication Instructions:   Your physician recommends that you continue on your current medications as directed. Please refer to the Current Medication list given to you today.   If you need a refill on your cardiac medications before your next appointment, please call your pharmacy.  Labwork: BMET AND MAG    Testing/Procedures: NONE ORDER TODAY    Follow-Up:  Your physician wants you to follow-up in:  IN  6  MONTHS WITH DR  Gregory Mccormick You will receive a reminder letter in the mail two months in advance. If you don't receive a letter, please call our office to schedule the follow-up appointment.   Remote monitoring is used to monitor your Pacemaker of ICD from home. This monitoring reduces the number of office visits required to check your device to one time per year. It allows [place] to keep an eye on the functioning of your device to ensure it is working properly. You are scheduled for a device check from home on . 10/05/16.You may send your transmission at any time that day. If you have a wireless device, the transmission will be sent automatically. After your physician reviews your transmission, you will receive a postcard with your next transmission date.     Any Other Special Instructions Will Be Listed Below (If Applicable).

## 2016-07-07 ENCOUNTER — Telehealth: Payer: Self-pay | Admitting: *Deleted

## 2016-07-07 NOTE — Telephone Encounter (Signed)
SPOKE TO PT ABOUT LAB RESULTS AND VERBALIZED UNDERSTANDING 

## 2016-07-07 NOTE — Telephone Encounter (Signed)
-----   Message from Sheilah Pigeon, [place] sent at 07/06/2016  4:40 PM EDT ----- Please let the patient know his labs look good  Thanks Renee

## 2016-09-29 ENCOUNTER — Other Ambulatory Visit: Payer: Self-pay | Admitting: Interventional Cardiology

## 2016-10-05 ENCOUNTER — Ambulatory Visit (INDEPENDENT_AMBULATORY_CARE_PROVIDER_SITE_OTHER): Payer: Medicare Other | Admitting: *Deleted

## 2016-10-05 DIAGNOSIS — I4430 Unspecified atrioventricular block: Secondary | ICD-10-CM | POA: Diagnosis not present

## 2016-10-05 NOTE — Progress Notes (Signed)
Cardiology Office Note   Date:  10/06/2016   ID:  Gregory Mccormick, DOB 19-Jul-1929, MRN [id number]  PCP:  Sissy HoffSWAYNE,DAVID W, MD    No chief complaint on file. CAD   Wt Readings from Last 3 Encounters:  10/06/16 203 lb (92.1 kg)  07/06/16 204 lb (92.5 kg)  04/21/16 203 lb (92.1 kg)       History of Present Illness: Gregory Mccormick is a 82 y.o. male  Gregory Mccormick is a 82 y.o. male  who had a cardiomyopathy. He was found to have CAD. He had stents to the LAD and circumflex in 2014 with rotational atherectomy. He has more energy. No CP. No SHOB. No bleeding problems on Plavix. ECHO showed improvement in LV dysfunction. No orthopnea.  No edema, palpitations.  He had some dizziness with standing quickly.  He still works a part time job.  He drives inmates to several different prisons.    He has had a few minor surgeries since his last visit.  No cardiac issues during those times. Overall feels very well.     Past Medical History:  Diagnosis Date  . Anemia, unspecified   . Arthritis    "right knee" (03/28/2013  . CAD in native artery    takes Xarelto daily  . Cardiomyopathy (HCC)    EF 40%  . Carpal tunnel syndrome   . Carpal tunnel syndrome, bilateral   . Essential hypertension, benign    takes Metoprolol daily  . Exertional shortness of breath    "recently" (03/28/2013)  . H/O hiatal hernia   . History of kidney stones   . Hyperlipidemia    takes Lipitor daily  . Joint pain   . Nocturia   . Nonspecific abnormal unspecified cardiovascular function study   . OSA on CPAP    wears CPAP 02/03/16  . Other primary cardiomyopathies   . Pacemaker    MDT  . Paroxysmal ventricular tachycardia (HCC)   . Peripheral edema    takes Lasix daily  . PONV (postoperative nausea and vomiting)   . Vitamin D deficiency     Past Surgical History:  Procedure Laterality Date  . CARDIAC CATHETERIZATION  03/21/2013  . CARPAL TUNNEL RELEASE Right 02/05/2016   Procedure: RIGHT LIMITED  OPEN CARPAL TUNNEL RELEASE;  Surgeon: Dominica SeverinWilliam Gramig, MD;  Location: [facility] OR;  Service: Orthopedics;  Laterality: Right;  . CARPAL TUNNEL RELEASE Left 04/21/2016   Procedure: CARPAL TUNNEL RELEASE;  Surgeon: Dominica SeverinWilliam Gramig, MD;  Location: [facility] OR;  Service: Orthopedics;  Laterality: Left;  . CATARACT EXTRACTION W/ INTRAOCULAR LENS  IMPLANT, BILATERAL Bilateral 2000's  . COLONOSCOPY    . CORONARY ANGIOPLASTY WITH STENT PLACEMENT  03/28/2013   "3 stents" (03/28/2013)  . INSERT / REPLACE / REMOVE PACEMAKER    . KNEE CARTILAGE SURGERY Left 1972   "cut it open" (03/28/2013)  . KNEE CARTILAGE SURGERY Right ~ 1977   "cut it open" (03/28/2013)  . PACEMAKER INSERTION  10/05/10   MDT Adapta L implanted by Dr Johney FrameAllred for mobitz II second degree AV block  . PERCUTANEOUS CORONARY ROTOBLATOR INTERVENTION (PCI-R) N/A 03/28/2013   Procedure: PERCUTANEOUS CORONARY ROTOBLATOR INTERVENTION (PCI-R);  Surgeon: Corky CraftsJayadeep S Madelein Mahadeo, MD;  [facility];  Service: Cardiovascular;  Laterality: N/A;  . SEPTOPLASTY N/A 02/05/2016   Procedure: SEPTOPLASTY WITH INTRANASAL SKIN GRAFT;  Surgeon: Drema Halonhristopher E Newman, MD;  [facility];  Service: ENT;  Laterality: N/A;  . SHOULDER HEMI-ARTHROPLASTY Right 1987   "got  hit by sled & dragged down sheet > 200 feet; tore shoulder up" (03/28/2013)  . SHOULDER SURGERY Right 1970's   "pulled muscle apart; sewed it back together" (03/28/2013  . TOTAL KNEE ARTHROPLASTY Right 2007  . TOTAL KNEE ARTHROPLASTY  11/26/2012   Procedure: TOTAL KNEE ARTHROPLASTY;  Surgeon: Loanne Drilling, MD;  Location: [facility] ORS;  Service: Orthopedics;  Laterality: Left;     Current Outpatient Prescriptions  Medication Sig Dispense Refill  . acetaminophen (TYLENOL) 500 MG tablet Take 1,000 mg by mouth at bedtime as needed for mild pain. For leg pain.    Marland Kitchen atorvastatin (LIPITOR) 10 MG tablet TAKE 1 TABLET (10 MG TOTAL) BY MOUTH DAILY AT 6 PM. 30 tablet 10  . cholecalciferol (VITAMIN D) 1000 UNITS tablet Take  5,000 Units by mouth daily.     . furosemide (LASIX) 40 MG tablet TAKE 1 TABLET (40 MG TOTAL) BY MOUTH DAILY. 30 tablet 0  . HYDROcodone-acetaminophen (NORCO) 5-325 MG tablet Take 1 tablet by mouth every 6 (six) hours as needed for moderate pain. 30 tablet 0  . metoprolol tartrate (LOPRESSOR) 25 MG tablet TAKE 1 TABLET (25 MG TOTAL) BY MOUTH 2 (TWO) TIMES DAILY. 60 tablet 11  . Misc Natural Products (OSTEO BI-FLEX ADV DOUBLE ST PO) Take 1 tablet by mouth daily.    . Multiple Vitamin (MULTIVITAMIN) tablet Take 1 tablet by mouth daily.    . Omega-3 Fatty Acids (FISH OIL PO) Take 1 tablet by mouth daily.    . rivaroxaban (XARELTO) 20 MG TABS tablet Take 1 tablet (20 mg total) by mouth daily with supper. 30 tablet 6   No current facility-administered medications for this visit.     Allergies:   Penicillins and Hydromorphone hcl    Social History:  The patient  reports that he quit smoking about 42 years ago. His smoking use included Cigarettes. He has a 26.00 pack-year smoking history. He has never used smokeless tobacco. He reports that he does not drink alcohol or use drugs.   Family History:  The patient's family history includes CAD (age of onset: 41) in his brother; Stroke in his brother.    ROS:  Please see the history of present illness.   Otherwise, review of systems are positive for walking the dogs daily; no joint pains- s/p knee replacemetns.   All other systems are reviewed and negative.    PHYSICAL EXAM: VS:  BP 118/80   Pulse 67   Ht 5\' 9"  (1.753 m)   Wt 203 lb (92.1 kg)   SpO2 97%   BMI 29.98 kg/m  , BMI Body mass index is 29.98 kg/m. GEN: Well nourished, well developed, in no acute distress  HEENT: normal  Neck: no JVD, carotid bruits, or masses Cardiac: RRR; no murmurs, rubs, or gallops,no edema  Respiratory:  clear to auscultation bilaterally, normal work of breathing GI: soft, nontender, nondistended, + BS MS: no deformity or atrophy  Skin: warm and dry, no  rash Neuro:  Strength and sensation are intact Psych: euthymic mood, full affect   EKG:   The ekg ordered in 9/17 demonstrates A paced rhythm, PVC   Recent Labs: 04/13/2016: Hemoglobin 13.8; Platelets 185 07/06/2016: BUN 15; Creat 1.06; Magnesium 2.2; Potassium 4.3; Sodium 141   Lipid Panel    Component Value Date/Time   CHOL  05/08/2009 0337    156        ATP III CLASSIFICATION:  <200     mg/dL   Desirable  161-096  mg/dL   Borderline High  >=742    mg/dL   High          TRIG 73 05/08/2009 0337   HDL 42 05/08/2009 0337   CHOLHDL 3.7 05/08/2009 0337   VLDL 15 05/08/2009 0337   LDLCALC  05/08/2009 0337    99        Total Cholesterol/HDL:CHD Risk Coronary Heart Disease Risk Table                     Men   Women  1/2 Average Risk   3.4   3.3  Average Risk       5.0   4.4  2 X Average Risk   9.6   7.1  3 X Average Risk  23.4   11.0        Use the calculated Patient Ratio above and the CHD Risk Table to determine the patient's CHD Risk.        ATP III CLASSIFICATION (LDL):  <100     mg/dL   Optimal  595-638  mg/dL   Near or Above                    Optimal  130-159  mg/dL   Borderline  756-433  mg/dL   High  >295     mg/dL   Very High     Other studies Reviewed: Additional studies/ records that were reviewed today with results demonstrating: cath results reviewed.   ASSESSMENT AND PLAN:  1. CAD:  No angina.  COntinue aggressive secondary prevention.   2. Pacer: Follows with Dr. [name].  Pacer for Mobitz II.  3. Hyperlipidemia:  Follwed by Dr. Azucena Cecil.  OK per his report.   4. Hypertensive heart disease:  BP controlled 5. AFib: Xarelto for stroke rpevention.     Current medicines are reviewed at length with the patient today.  The patient concerns regarding his medicines were addressed.  The following changes have been made:  No change  Labs/ tests ordered today include:  No orders of the defined types were placed in this encounter.   Recommend 150  minutes/week of aerobic exercise Low fat, low carb, high fiber diet recommended  Disposition:   FU in 1 year   Signed, Lance Muss, MD  10/06/2016 10:53 AM    [facility] [address] [place], [place], [place]  [address] Phone: [phone]; Fax: [phone]

## 2016-10-06 ENCOUNTER — Ambulatory Visit (INDEPENDENT_AMBULATORY_CARE_PROVIDER_SITE_OTHER): Payer: Medicare Other | Admitting: Interventional Cardiology

## 2016-10-06 ENCOUNTER — Encounter: Payer: Self-pay | Admitting: Interventional Cardiology

## 2016-10-06 VITALS — BP 118/80 | HR 67 | Ht 69.0 in | Wt 203.0 lb

## 2016-10-06 DIAGNOSIS — E782 Mixed hyperlipidemia: Secondary | ICD-10-CM

## 2016-10-06 DIAGNOSIS — I441 Atrioventricular block, second degree: Secondary | ICD-10-CM

## 2016-10-06 DIAGNOSIS — I2510 Atherosclerotic heart disease of native coronary artery without angina pectoris: Secondary | ICD-10-CM | POA: Diagnosis not present

## 2016-10-06 DIAGNOSIS — I480 Paroxysmal atrial fibrillation: Secondary | ICD-10-CM | POA: Diagnosis not present

## 2016-10-06 NOTE — Patient Instructions (Signed)

## 2016-10-06 NOTE — Progress Notes (Signed)
Remote pacemaker transmission.   

## 2016-10-10 ENCOUNTER — Other Ambulatory Visit: Payer: Self-pay | Admitting: Interventional Cardiology

## 2016-10-12 ENCOUNTER — Encounter: Payer: Self-pay | Admitting: Cardiology

## 2016-10-12 ENCOUNTER — Other Ambulatory Visit: Payer: Self-pay | Admitting: Interventional Cardiology

## 2016-11-01 LAB — CUP PACEART REMOTE DEVICE CHECK
Battery Impedance: 373 Ohm
Battery Remaining Longevity: 102 mo
Battery Voltage: 2.78 V
Brady Statistic AP VP Percent: 6 %
Implantable Lead Implant Date: 20111206
Implantable Lead Location: 753859
Implantable Lead Model: 5076
Implantable Pulse Generator Implant Date: 20111206
Lead Channel Impedance Value: 432 Ohm
Lead Channel Impedance Value: 774 Ohm
Lead Channel Setting Pacing Amplitude: 2 V
Lead Channel Setting Pacing Amplitude: 2.5 V
Lead Channel Setting Pacing Pulse Width: 0.4 ms
Lead Channel Setting Sensing Sensitivity: 5.6 mV
MDC IDC LEAD IMPLANT DT: 20111206
MDC IDC LEAD LOCATION: 753860
MDC IDC MSMT LEADCHNL RA PACING THRESHOLD AMPLITUDE: 0.875 V
MDC IDC MSMT LEADCHNL RA PACING THRESHOLD PULSEWIDTH: 0.4 ms
MDC IDC MSMT LEADCHNL RV PACING THRESHOLD AMPLITUDE: 1 V
MDC IDC MSMT LEADCHNL RV PACING THRESHOLD PULSEWIDTH: 0.4 ms
MDC IDC SESS DTM: 20171206223246
MDC IDC STAT BRADY AP VS PERCENT: 50 %
MDC IDC STAT BRADY AS VP PERCENT: 7 %
MDC IDC STAT BRADY AS VS PERCENT: 37 %

## 2016-12-14 ENCOUNTER — Other Ambulatory Visit: Payer: Self-pay | Admitting: Interventional Cardiology

## 2017-01-02 ENCOUNTER — Encounter: Payer: Self-pay | Admitting: Internal Medicine

## 2017-01-02 ENCOUNTER — Ambulatory Visit (INDEPENDENT_AMBULATORY_CARE_PROVIDER_SITE_OTHER): Payer: Medicare Other | Admitting: Internal Medicine

## 2017-01-02 VITALS — BP 120/78 | HR 82 | Ht 69.5 in | Wt 205.4 lb

## 2017-01-02 DIAGNOSIS — I10 Essential (primary) hypertension: Secondary | ICD-10-CM

## 2017-01-02 DIAGNOSIS — I441 Atrioventricular block, second degree: Secondary | ICD-10-CM | POA: Diagnosis not present

## 2017-01-02 DIAGNOSIS — I480 Paroxysmal atrial fibrillation: Secondary | ICD-10-CM

## 2017-01-02 LAB — CUP PACEART INCLINIC DEVICE CHECK
Battery Remaining Longevity: 102 mo
Brady Statistic AP VP Percent: 5 %
Brady Statistic AP VS Percent: 53 %
Brady Statistic AS VP Percent: 6 %
Date Time Interrogation Session: 20180305135127
Implantable Lead Implant Date: 20111206
Implantable Lead Location: 753859
Implantable Lead Location: 753860
Implantable Lead Model: 5076
Implantable Lead Model: 5092
Implantable Pulse Generator Implant Date: 20111206
Lead Channel Pacing Threshold Amplitude: 0.75 V
Lead Channel Pacing Threshold Pulse Width: 0.4 ms
Lead Channel Sensing Intrinsic Amplitude: 2 mV
Lead Channel Setting Pacing Amplitude: 2.5 V
MDC IDC LEAD IMPLANT DT: 20111206
MDC IDC MSMT BATTERY IMPEDANCE: 373 Ohm
MDC IDC MSMT BATTERY VOLTAGE: 2.79 V
MDC IDC MSMT LEADCHNL RA IMPEDANCE VALUE: 442 Ohm
MDC IDC MSMT LEADCHNL RV IMPEDANCE VALUE: 687 Ohm
MDC IDC MSMT LEADCHNL RV PACING THRESHOLD AMPLITUDE: 0.75 V
MDC IDC MSMT LEADCHNL RV PACING THRESHOLD PULSEWIDTH: 0.4 ms
MDC IDC MSMT LEADCHNL RV SENSING INTR AMPL: 15.67 mV
MDC IDC SET LEADCHNL RA PACING AMPLITUDE: 2 V
MDC IDC SET LEADCHNL RV PACING PULSEWIDTH: 0.4 ms
MDC IDC SET LEADCHNL RV SENSING SENSITIVITY: 5.6 mV
MDC IDC STAT BRADY AS VS PERCENT: 36 %

## 2017-01-02 LAB — CBC WITH DIFFERENTIAL/PLATELET
Basophils Absolute: 0 10*3/uL (ref 0.0–0.2)
Basos: 0 %
EOS (ABSOLUTE): 0.1 10*3/uL (ref 0.0–0.4)
EOS: 2 %
HEMOGLOBIN: 13.9 g/dL (ref 13.0–17.7)
Hematocrit: 40.2 % (ref 37.5–51.0)
IMMATURE GRANS (ABS): 0 10*3/uL (ref 0.0–0.1)
IMMATURE GRANULOCYTES: 0 %
LYMPHS: 20 %
Lymphocytes Absolute: 1.2 10*3/uL (ref 0.7–3.1)
MCH: 31.9 pg (ref 26.6–33.0)
MCHC: 34.6 g/dL (ref 31.5–35.7)
MCV: 92 fL (ref 79–97)
MONOCYTES: 10 %
Monocytes Absolute: 0.6 10*3/uL (ref 0.1–0.9)
Neutrophils Absolute: 4.2 10*3/uL (ref 1.4–7.0)
Neutrophils: 68 %
Platelets: 171 10*3/uL (ref 150–379)
RBC: 4.36 x10E6/uL (ref 4.14–5.80)
RDW: 13.5 % (ref 12.3–15.4)
WBC: 6.2 10*3/uL (ref 3.4–10.8)

## 2017-01-02 LAB — BASIC METABOLIC PANEL
BUN/Creatinine Ratio: 10 (ref 10–24)
BUN: 11 mg/dL (ref 8–27)
CO2: 23 mmol/L (ref 18–29)
CREATININE: 1.06 mg/dL (ref 0.76–1.27)
Calcium: 8.8 mg/dL (ref 8.6–10.2)
Chloride: 101 mmol/L (ref 96–106)
GFR calc Af Amer: 74 mL/min/{1.73_m2} (ref >59)
GFR, EST NON AFRICAN AMERICAN: 64 mL/min/{1.73_m2} (ref >59)
Glucose: 133 mg/dL — ABNORMAL HIGH (ref 65–99)
Potassium: 4.1 mmol/L (ref 3.5–5.2)
SODIUM: 143 mmol/L (ref 134–144)

## 2017-01-02 NOTE — Progress Notes (Signed)
Electrophysiology Office Note   Date:  01/02/2017   ID:  Gregory Mccormick, DOB 06-26-1929, MRN [id number]  PCP:  Sissy Hoff, MD  Cardiologist:  Dr Eldridge Dace Primary Electrophysiologist: Hillis Range, MD    Chief Complaint  Patient presents with  . Atrial Fibrillation     History of Present Illness: Gregory Mccormick is a 83 y.o. male who presents today for electrophysiology evaluation.   He has done very well since his last visit.  He remains active for his age.  He has recent epistaxis which is currently improved. Today, he denies symptoms of palpitations, chest pain, shortness of breath, orthopnea, PND, lower extremity edema, claudication, dizziness, presyncope, syncope, bleeding, or neurologic sequela. The patient is tolerating medications without difficulties and is otherwise without complaint today.    Past Medical History:  Diagnosis Date  . Anemia, unspecified   . Arthritis    "right knee" (03/28/2013  . CAD in native artery    takes Xarelto daily  . Cardiomyopathy (HCC)    EF 40%  . Carpal tunnel syndrome   . Carpal tunnel syndrome, bilateral   . Essential hypertension, benign    takes Metoprolol daily  . Exertional shortness of breath    "recently" (03/28/2013)  . H/O hiatal hernia   . History of kidney stones   . Hyperlipidemia    takes Lipitor daily  . Joint pain   . Nocturia   . Nonspecific abnormal unspecified cardiovascular function study   . OSA on CPAP    wears CPAP 02/03/16  . Other primary cardiomyopathies   . Pacemaker    MDT  . Paroxysmal ventricular tachycardia (HCC)   . Peripheral edema    takes Lasix daily  . PONV (postoperative nausea and vomiting)   . Vitamin D deficiency    Past Surgical History:  Procedure Laterality Date  . CARDIAC CATHETERIZATION  03/21/2013  . CARPAL TUNNEL RELEASE Right 02/05/2016   Procedure: RIGHT LIMITED OPEN CARPAL TUNNEL RELEASE;  Surgeon: Dominica Severin, MD;  Location: [facility] OR;  Service: Orthopedics;  Laterality:  Right;  . CARPAL TUNNEL RELEASE Left 04/21/2016   Procedure: CARPAL TUNNEL RELEASE;  Surgeon: Dominica Severin, MD;  Location: [facility] OR;  Service: Orthopedics;  Laterality: Left;  . CATARACT EXTRACTION W/ INTRAOCULAR LENS  IMPLANT, BILATERAL Bilateral 2000's  . COLONOSCOPY    . CORONARY ANGIOPLASTY WITH STENT PLACEMENT  03/28/2013   "3 stents" (03/28/2013)  . INSERT / REPLACE / REMOVE PACEMAKER    . KNEE CARTILAGE SURGERY Left 1972   "cut it open" (03/28/2013)  . KNEE CARTILAGE SURGERY Right ~ 1977   "cut it open" (03/28/2013)  . PACEMAKER INSERTION  10/05/10   MDT Adapta L implanted by Dr [name] for mobitz II second degree AV block  . PERCUTANEOUS CORONARY ROTOBLATOR INTERVENTION (PCI-R) N/A 03/28/2013   Procedure: PERCUTANEOUS CORONARY ROTOBLATOR INTERVENTION (PCI-R);  Surgeon: Corky Crafts, MD;  Location: [facility] CATH LAB;  Service: Cardiovascular;  Laterality: N/A;  . SEPTOPLASTY N/A 02/05/2016   Procedure: SEPTOPLASTY WITH INTRANASAL SKIN GRAFT;  Surgeon: Drema Halon, MD;  Location: [facility] OR;  Service: ENT;  Laterality: N/A;  . SHOULDER HEMI-ARTHROPLASTY Right 1987   "got hit by sled & dragged down sheet > 200 feet; tore shoulder up" (03/28/2013)  . SHOULDER SURGERY Right 1970's   "pulled muscle apart; sewed it back together" (03/28/2013  . TOTAL KNEE ARTHROPLASTY Right 2007  . TOTAL KNEE ARTHROPLASTY  11/26/2012   Procedure: TOTAL KNEE ARTHROPLASTY;  Surgeon: Gus Rankin  Aluisio, MD;  Location: [facility] ORS;  Service: Orthopedics;  Laterality: Left;     Current Outpatient Prescriptions  Medication Sig Dispense Refill  . acetaminophen (TYLENOL) 500 MG tablet Take 1,000 mg by mouth at bedtime as needed for mild pain. For leg pain.    Marland Kitchen atorvastatin (LIPITOR) 10 MG tablet TAKE 1 TABLET (10 MG TOTAL) BY MOUTH DAILY AT 6 PM. 30 tablet 9  . Cholecalciferol (VITAMIN D3) 5000 units CAPS Take 1 capsule by mouth daily.    . clindamycin (CLEOCIN) 150 MG capsule Take 1 capsule by mouth 2 (two) times daily.     . furosemide (LASIX) 40 MG tablet TAKE 1 TABLET (40 MG TOTAL) BY MOUTH DAILY. 30 tablet 0  . HYDROcodone-acetaminophen (NORCO) 5-325 MG tablet Take 1 tablet by mouth every 6 (six) hours as needed for moderate pain. 30 tablet 0  . metoprolol tartrate (LOPRESSOR) 25 MG tablet TAKE 1 TABLET (25 MG TOTAL) BY MOUTH 2 (TWO) TIMES DAILY. 60 tablet 11  . Misc Natural Products (OSTEO BI-FLEX ADV DOUBLE ST PO) Take 1 tablet by mouth daily.    . Multiple Vitamin (MULTIVITAMIN) tablet Take 1 tablet by mouth daily.    . Omega-3 Fatty Acids (FISH OIL PO) Take 1 tablet by mouth daily.    . rivaroxaban (XARELTO) 20 MG TABS tablet Take 1 tablet (20 mg total) by mouth daily with supper. 30 tablet 11   No current facility-administered medications for this visit.     Allergies:   Penicillins and Hydromorphone hcl   Social History:  The patient  reports that he quit smoking about 43 years ago. His smoking use included Cigarettes. He has a 26.00 pack-year smoking history. He has never used smokeless tobacco. He reports that he does not drink alcohol or use drugs.   Family History:  The patient's family history includes CAD (age of onset: 53) in his brother; Stroke in his brother.    ROS:  Please see the history of present illness.   All other systems are reviewed and negative.    PHYSICAL EXAM: VS:  BP 120/78   Pulse 82   Ht 5' 9.5" (1.765 m)   Wt 205 lb 6 oz (93.2 kg)   SpO2 94%   BMI 29.89 kg/m  , BMI Body mass index is 29.89 kg/m. GEN: Well nourished, well developed, in no acute distress  HEENT: normal  Neck: no JVD, carotid bruits, or masses Cardiac: RRR; no murmurs, rubs, or gallops,no edema  Respiratory:  clear to auscultation bilaterally, normal work of breathing GI: soft, nontender, nondistended, + BS MS: no deformity or atrophy  Skin: warm and dry, device pocket is well healed Neuro:  Strength and sensation are intact Psych: euthymic mood, full affect  Device interrogation is reviewed  today in detail.  See PaceArt for details.   Recent Labs: 04/13/2016: Hemoglobin 13.8; Platelets 185 07/06/2016: BUN 15; Creat 1.06; Magnesium 2.2; Potassium 4.3; Sodium 141    Lipid Panel     Component Value Date/Time   CHOL  05/08/2009 0337    156        ATP III CLASSIFICATION:  <200     mg/dL   Desirable  119-147  mg/dL   Borderline High  >=829    mg/dL   High          TRIG 73 05/08/2009 0337   HDL 42 05/08/2009 0337   CHOLHDL 3.7 05/08/2009 0337   VLDL 15 05/08/2009 0337   LDLCALC  05/08/2009 5621  99        Total Cholesterol/HDL:CHD Risk Coronary Heart Disease Risk Table                     Men   Women  1/2 Average Risk   3.4   3.3  Average Risk       5.0   4.4  2 X Average Risk   9.6   7.1  3 X Average Risk  23.4   11.0        Use the calculated Patient Ratio above and the CHD Risk Table to determine the patient's CHD Risk.        ATP III CLASSIFICATION (LDL):  <100     mg/dL   Optimal  657-846  mg/dL   Near or Above                    Optimal  130-159  mg/dL   Borderline  962-952  mg/dL   High  >841     mg/dL   Very High     Wt Readings from Last 3 Encounters:  01/02/17 205 lb 6 oz (93.2 kg)  10/06/16 203 lb (92.1 kg)  07/06/16 204 lb (92.5 kg)     ASSESSMENT AND PLAN:  1. Mobitz II second degree AV block Normal pacemaker function See Pace Art report No changes today  2. Ischemic CM/ CAD No ischemic symptoms No changes  3. AFib No symptoms Well controlled off of AAD therapy (no afib in the past year) chads2vasc score is at least 5 Tolerating xarelto Bmet. Cbc today  4. HTN Stable No change required today  carelink Return to see EP Np every 1 year Follow-up with Dr Eldridge Dace as scheduled  Current medicines are reviewed at length with the patient today.   The patient does not have concerns regarding his medicines.  The following changes were made today:  none  Signed, Hillis Range, MD  01/02/2017 11:33 AM     [facility] [address] [place] [address] [phone] (office) [phone] (fax)

## 2017-01-02 NOTE — Addendum Note (Signed)
Addended by: Dennis Bast F on: 01/02/2017 11:50 AM   Modules accepted: Orders

## 2017-01-02 NOTE — Patient Instructions (Addendum)
Medication Instructions:  Your physician recommends that you continue on your current medications as directed. Please refer to the Current Medication list given to you today.   Labwork: Your physician recommends that you return for lab work Today: BMET/CBC   Testing/Procedures: None ordered  Follow-Up: Your physician wants you to follow-up in: 12 months with Gypsy Balsam, NP. You will receive a reminder letter in the mail two months in advance. If you don't receive a letter, please call our office to schedule the follow-up appointment.  Remote monitoring is used to monitor your Pacemaker from home. This monitoring reduces the number of office visits required to check your device to one time per year. It allows [place] to keep an eye on the functioning of your device to ensure it is working properly. You are scheduled for a device check from home on 04/03/17. You may send your transmission at any time that day. If you have a wireless device, the transmission will be sent automatically. After your physician reviews your transmission, you will receive a postcard with your next transmission date.

## 2017-02-13 ENCOUNTER — Other Ambulatory Visit: Payer: Self-pay | Admitting: Interventional Cardiology

## 2017-04-03 ENCOUNTER — Telehealth: Payer: Self-pay | Admitting: Cardiology

## 2017-04-03 ENCOUNTER — Ambulatory Visit: Payer: Medicare Other | Admitting: *Deleted

## 2017-04-03 NOTE — Telephone Encounter (Signed)
Spoke with pt and reminded pt of remote transmission that is due today. Pt verbalized understanding.   

## 2017-04-03 NOTE — Progress Notes (Signed)
Remote pacemaker transmission.   

## 2017-04-07 ENCOUNTER — Encounter: Payer: Self-pay | Admitting: Cardiology

## 2017-04-13 ENCOUNTER — Ambulatory Visit (INDEPENDENT_AMBULATORY_CARE_PROVIDER_SITE_OTHER): Payer: Medicare Other | Admitting: *Deleted

## 2017-04-13 DIAGNOSIS — I4430 Unspecified atrioventricular block: Secondary | ICD-10-CM

## 2017-04-14 ENCOUNTER — Encounter: Payer: Self-pay | Admitting: Cardiology

## 2017-04-14 NOTE — Progress Notes (Signed)
Remote pacemaker transmission.   

## 2017-04-18 LAB — CUP PACEART REMOTE DEVICE CHECK
Battery Impedance: 446 Ohm
Brady Statistic AS VS Percent: 46 %
Date Time Interrogation Session: 20180614192437
Implantable Lead Implant Date: 20111206
Lead Channel Impedance Value: 790 Ohm
Lead Channel Pacing Threshold Amplitude: 1.125 V
Lead Channel Pacing Threshold Pulse Width: 0.4 ms
Lead Channel Setting Sensing Sensitivity: 5.6 mV
MDC IDC LEAD IMPLANT DT: 20111206
MDC IDC LEAD LOCATION: 753859
MDC IDC LEAD LOCATION: 753860
MDC IDC MSMT BATTERY REMAINING LONGEVITY: 96 mo
MDC IDC MSMT BATTERY VOLTAGE: 2.79 V
MDC IDC MSMT LEADCHNL RA IMPEDANCE VALUE: 436 Ohm
MDC IDC MSMT LEADCHNL RA PACING THRESHOLD AMPLITUDE: 0.875 V
MDC IDC MSMT LEADCHNL RA PACING THRESHOLD PULSEWIDTH: 0.4 ms
MDC IDC PG IMPLANT DT: 20111206
MDC IDC SET LEADCHNL RA PACING AMPLITUDE: 2 V
MDC IDC SET LEADCHNL RV PACING AMPLITUDE: 2.5 V
MDC IDC SET LEADCHNL RV PACING PULSEWIDTH: 0.4 ms
MDC IDC STAT BRADY AP VP PERCENT: 3 %
MDC IDC STAT BRADY AP VS PERCENT: 47 %
MDC IDC STAT BRADY AS VP PERCENT: 4 %

## 2017-07-05 ENCOUNTER — Telehealth: Payer: Self-pay | Admitting: Cardiology

## 2017-07-05 ENCOUNTER — Encounter: Payer: Medicare Other | Admitting: *Deleted

## 2017-07-05 NOTE — Telephone Encounter (Signed)
Spoke with pt and reminded pt of remote transmission that is due today. Pt verbalized understanding.   

## 2017-07-13 ENCOUNTER — Encounter: Payer: Medicare Other | Admitting: *Deleted

## 2017-07-13 ENCOUNTER — Telehealth: Payer: Self-pay | Admitting: Cardiology

## 2017-07-13 NOTE — Telephone Encounter (Signed)
Spoke with pt and reminded pt of remote transmission that is due today. Pt verbalized understanding.   

## 2017-07-14 ENCOUNTER — Encounter: Payer: Self-pay | Admitting: Cardiology

## 2017-08-01 ENCOUNTER — Ambulatory Visit (INDEPENDENT_AMBULATORY_CARE_PROVIDER_SITE_OTHER): Payer: Medicare Other | Admitting: *Deleted

## 2017-08-01 DIAGNOSIS — I441 Atrioventricular block, second degree: Secondary | ICD-10-CM

## 2017-08-01 NOTE — Progress Notes (Signed)
Remote pacemaker transmission.   

## 2017-08-02 ENCOUNTER — Encounter: Payer: Self-pay | Admitting: Cardiology

## 2017-08-03 LAB — CUP PACEART REMOTE DEVICE CHECK
Battery Remaining Longevity: 92 mo
Brady Statistic AS VP Percent: 7 %
Implantable Lead Implant Date: 20111206
Implantable Lead Implant Date: 20111206
Implantable Lead Model: 5092
Implantable Pulse Generator Implant Date: 20111206
Lead Channel Pacing Threshold Amplitude: 1 V
Lead Channel Pacing Threshold Pulse Width: 0.4 ms
Lead Channel Setting Pacing Amplitude: 2 V
Lead Channel Setting Pacing Amplitude: 2.5 V
Lead Channel Setting Pacing Pulse Width: 0.4 ms
Lead Channel Setting Sensing Sensitivity: 5.6 mV
MDC IDC LEAD LOCATION: 753859
MDC IDC LEAD LOCATION: 753860
MDC IDC MSMT BATTERY IMPEDANCE: 495 Ohm
MDC IDC MSMT BATTERY VOLTAGE: 2.78 V
MDC IDC MSMT LEADCHNL RA IMPEDANCE VALUE: 449 Ohm
MDC IDC MSMT LEADCHNL RA PACING THRESHOLD AMPLITUDE: 0.875 V
MDC IDC MSMT LEADCHNL RA PACING THRESHOLD PULSEWIDTH: 0.4 ms
MDC IDC MSMT LEADCHNL RA SENSING INTR AMPL: 1.4 mV
MDC IDC MSMT LEADCHNL RV IMPEDANCE VALUE: 810 Ohm
MDC IDC MSMT LEADCHNL RV SENSING INTR AMPL: 8 mV
MDC IDC SESS DTM: 20181002145526
MDC IDC STAT BRADY AP VP PERCENT: 6 %
MDC IDC STAT BRADY AP VS PERCENT: 48 %
MDC IDC STAT BRADY AS VS PERCENT: 39 %

## 2017-08-17 ENCOUNTER — Other Ambulatory Visit: Payer: Self-pay | Admitting: Urology

## 2017-08-17 DIAGNOSIS — R310 Gross hematuria: Secondary | ICD-10-CM

## 2017-08-21 ENCOUNTER — Ambulatory Visit
Admission: RE | Admit: 2017-08-21 | Discharge: 2017-08-21 | Disposition: A | Discharge status: Home / Self Care | Payer: Medicare Other | Source: Ambulatory Visit | Attending: Urology | Admitting: Urology

## 2017-08-21 DIAGNOSIS — R310 Gross hematuria: Secondary | ICD-10-CM

## 2017-08-21 MED ORDER — IOPAMIDOL (ISOVUE-300) INJECTION 61%
125.0000 mL | Freq: Once | INTRAVENOUS | Status: DC | PRN
Start: 2017-08-21 — End: 2017-08-22

## 2017-09-18 ENCOUNTER — Telehealth: Payer: Self-pay | Admitting: Internal Medicine

## 2017-09-18 NOTE — Telephone Encounter (Signed)
Pt's wife calling regarding pt falling Saturday and now has pain under Pacemaker, some SOB that started yesterday, denies any other symptoms. Per wife pt wants to be seen-pls advise [phone]

## 2017-09-18 NOTE — Telephone Encounter (Signed)
Spoke with patient regarding his fall on Saturday and the discomfort he is having at his pacemaker site.  He will send [place] a transmission and we will further evaluate.  He verbalized an understanding.

## 2017-09-18 NOTE — Telephone Encounter (Signed)
Spoke with patient regarding his pacemaker transmission and informed him that things look good from a mechanical standpoint.  We discussed him coming in on 11/21 to f/u with Dr. [name], but he would like to see how things go over the next week and keep [place] updated and make an appt if needed.

## 2017-10-15 ENCOUNTER — Other Ambulatory Visit: Payer: Self-pay | Admitting: Interventional Cardiology

## 2017-11-01 ENCOUNTER — Telehealth: Payer: Self-pay | Admitting: Cardiology

## 2017-11-01 ENCOUNTER — Ambulatory Visit (INDEPENDENT_AMBULATORY_CARE_PROVIDER_SITE_OTHER): Payer: Medicare Other | Admitting: *Deleted

## 2017-11-01 DIAGNOSIS — I441 Atrioventricular block, second degree: Secondary | ICD-10-CM | POA: Diagnosis not present

## 2017-11-01 NOTE — Telephone Encounter (Signed)
Spoke with pt and reminded pt of remote transmission that is due today. Pt verbalized understanding.   

## 2017-11-02 NOTE — Progress Notes (Signed)
Remote pacemaker transmission.   

## 2017-11-03 ENCOUNTER — Encounter: Payer: Self-pay | Admitting: Cardiology

## 2017-11-04 LAB — CUP PACEART REMOTE DEVICE CHECK
Battery Impedance: 544 Ohm
Battery Remaining Longevity: 87 mo
Brady Statistic AP VP Percent: 8 %
Brady Statistic AS VP Percent: 8 %
Brady Statistic AS VS Percent: 38 %
Date Time Interrogation Session: 20190102203708
Implantable Lead Implant Date: 20111206
Implantable Lead Implant Date: 20111206
Implantable Lead Location: 753859
Implantable Lead Model: 5092
Implantable Pulse Generator Implant Date: 20111206
Lead Channel Impedance Value: 425 Ohm
Lead Channel Impedance Value: 765 Ohm
Lead Channel Pacing Threshold Amplitude: 0.75 V
Lead Channel Pacing Threshold Amplitude: 0.75 V
Lead Channel Pacing Threshold Pulse Width: 0.4 ms
Lead Channel Setting Pacing Amplitude: 2 V
Lead Channel Setting Pacing Amplitude: 2.5 V
Lead Channel Setting Sensing Sensitivity: 5.6 mV
MDC IDC LEAD LOCATION: 753860
MDC IDC MSMT BATTERY VOLTAGE: 2.79 V
MDC IDC MSMT LEADCHNL RV PACING THRESHOLD PULSEWIDTH: 0.4 ms
MDC IDC SET LEADCHNL RV PACING PULSEWIDTH: 0.4 ms
MDC IDC STAT BRADY AP VS PERCENT: 46 %

## 2017-11-11 ENCOUNTER — Other Ambulatory Visit: Payer: Self-pay | Admitting: Interventional Cardiology

## 2017-11-15 ENCOUNTER — Other Ambulatory Visit: Payer: Self-pay | Admitting: Interventional Cardiology

## 2017-11-16 ENCOUNTER — Other Ambulatory Visit: Payer: Self-pay | Admitting: Interventional Cardiology

## 2017-11-16 NOTE — Progress Notes (Signed)
Cardiology Office Note   Date:  11/17/2017   ID:  Gregory Mccormick, DOB [birth year], MRN [id number]  PCP:  Tally Joe, MD    No chief complaint on file.  CAD  Wt Readings from Last 3 Encounters:  11/17/17 199 lb 6.4 oz (90.4 kg)  01/02/17 205 lb 6 oz (93.2 kg)  10/06/16 203 lb (92.1 kg)       History of Present Illness: Gregory Mccormick is a 84 y.o. male  who had a cardiomyopathy. He was found to have CAD. He had stents to the LAD and circumflex in 2014 with rotational atherectomy.  Denies : Chest pain. Dizziness. Leg edema. Nitroglycerin use. Orthopnea. Palpitations. Paroxysmal nocturnal dyspnea. Shortness of breath. Syncope.   He continues to walk.  He has a dog.  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    He continues to get his pacemaker checked as well.       Past Medical History:  Diagnosis Date  . Anemia, unspecified   . Arthritis    "right knee" (03/28/2013  . CAD in native artery    takes Xarelto daily  . Cardiomyopathy (HCC)    EF 40%  . Carpal tunnel syndrome   . Carpal tunnel syndrome, bilateral   . Essential hypertension, benign    takes Metoprolol daily  . Exertional shortness of breath    "recently" (03/28/2013)  . H/O hiatal hernia   . History of kidney stones   . Hyperlipidemia    takes Lipitor daily  . Joint pain   . Nocturia   . Nonspecific abnormal unspecified cardiovascular function study   . OSA on CPAP    wears CPAP 02/03/16  . Other primary cardiomyopathies   . Pacemaker    MDT  . Paroxysmal ventricular tachycardia (HCC)   . Peripheral edema    takes Lasix daily  . PONV (postoperative nausea and vomiting)   . Vitamin D deficiency     Past Surgical History:  Procedure Laterality Date  . CARDIAC CATHETERIZATION  03/21/2013  . CARPAL TUNNEL RELEASE Right 02/05/2016   Procedure: RIGHT LIMITED OPEN CARPAL TUNNEL RELEASE;  Surgeon: Dominica Severin, MD;  Location: [facility] OR;  Service: Orthopedics;   Laterality: Right;  . CARPAL TUNNEL RELEASE Left 04/21/2016   Procedure: CARPAL TUNNEL RELEASE;  Surgeon: Dominica Severin, MD;  Location: [facility] OR;  Service: Orthopedics;  Laterality: Left;  . CATARACT EXTRACTION W/ INTRAOCULAR LENS  IMPLANT, BILATERAL Bilateral 2000's  . COLONOSCOPY    . CORONARY ANGIOPLASTY WITH STENT PLACEMENT  03/28/2013   "3 stents" (03/28/2013)  . INSERT / REPLACE / REMOVE PACEMAKER    . KNEE CARTILAGE SURGERY Left 1972   "cut it open" (03/28/2013)  . KNEE CARTILAGE SURGERY Right ~ 1977   "cut it open" (03/28/2013)  . PACEMAKER INSERTION  10/05/10   MDT Adapta L implanted by Dr [name] for mobitz II second degree AV block  . PERCUTANEOUS CORONARY ROTOBLATOR INTERVENTION (PCI-R) N/A 03/28/2013   Procedure: PERCUTANEOUS CORONARY ROTOBLATOR INTERVENTION (PCI-R);  Surgeon: Corky Crafts, MD;  Location: [facility] CATH LAB;  Service: Cardiovascular;  Laterality: N/A;  . SEPTOPLASTY N/A 02/05/2016   Procedure: SEPTOPLASTY WITH INTRANASAL SKIN GRAFT;  Surgeon: Drema Halon, MD;  Location: [facility] OR;  Service: ENT;  Laterality: N/A;  . SHOULDER HEMI-ARTHROPLASTY Right 1987   "got hit by sled & dragged down sheet > 200 feet; tore shoulder up" (03/28/2013)  . SHOULDER SURGERY Right 1970's   "  pulled muscle apart; sewed it back together" (03/28/2013  . TOTAL KNEE ARTHROPLASTY Right 2007  . TOTAL KNEE ARTHROPLASTY  11/26/2012   Procedure: TOTAL KNEE ARTHROPLASTY;  Surgeon: Loanne Drilling, MD;  Location: [facility] ORS;  Service: Orthopedics;  Laterality: Left;     Current Outpatient Medications  Medication Sig Dispense Refill  . acetaminophen (TYLENOL) 500 MG tablet Take 1,000 mg by mouth at bedtime as needed for mild pain. For leg pain.    Marland Kitchen atorvastatin (LIPITOR) 10 MG tablet Take 1 tablet (10 mg total) by mouth daily at 6 PM. Please make overdue yearly appt with Dr. Eldridge Dace before anymore refills. 1st attempt 30 tablet 0  . Cholecalciferol (VITAMIN D3) 5000 units CAPS Take 1 capsule by mouth  daily.    . ciprofloxacin (CIPRO) 250 MG tablet Take 250 mg by mouth 2 (two) times daily.  0  . clindamycin (CLEOCIN) 150 MG capsule Take 1 capsule by mouth 2 (two) times daily.    Marland Kitchen doxycycline (VIBRA-TABS) 100 MG tablet Take 100 mg by mouth 2 (two) times daily.  0  . furosemide (LASIX) 40 MG tablet TAKE 1 TABLET (40 MG TOTAL) BY MOUTH DAILY. 30 tablet 7  . HYDROcodone-acetaminophen (NORCO) 5-325 MG tablet Take 1 tablet by mouth every 6 (six) hours as needed for moderate pain. 30 tablet 0  . metoprolol tartrate (LOPRESSOR) 25 MG tablet Take 1 tablet (25 mg total) by mouth 2 (two) times daily. Please make yearly appt with Dr. Eldridge Dace before anymore refills. 1st attempt 60 tablet 0  . Misc Natural Products (OSTEO BI-FLEX ADV DOUBLE ST PO) Take 1 tablet by mouth daily.    . Multiple Vitamin (MULTIVITAMIN) tablet Take 1 tablet by mouth daily.    . Omega-3 Fatty Acids (FISH OIL PO) Take 1 tablet by mouth daily.    Carlena Hurl 20 MG TABS tablet TAKE 1 TABLET (20 MG TOTAL) BY MOUTH DAILY WITH SUPPER. 30 tablet 3   No current facility-administered medications for this visit.     Allergies:   Penicillins and Hydromorphone hcl    Social History:  The patient  reports that he quit smoking about 44 years ago. His smoking use included cigarettes. He has a 26.00 pack-year smoking history. he has never used smokeless tobacco. He reports that he does not drink alcohol or use drugs.   Family History:  The patient's family history includes CAD (age of onset: 26) in his brother; Cancer in his unknown relative; Heart disease in his unknown relative; Stroke in his brother.    ROS:  Please see the history of present illness.   Otherwise, review of systems are positive for hematuria.   All other systems are reviewed and negative.    PHYSICAL EXAM: VS:  BP 124/80   Pulse 85   Ht 5' 9.5" (1.765 m)   Wt 199 lb 6.4 oz (90.4 kg)   SpO2 98%   BMI 29.02 kg/m  , BMI Body mass index is 29.02 kg/m. GEN: Well  nourished, well developed, in no acute distress  HEENT: normal  Neck: no JVD, carotid bruits, or masses Cardiac: RRR, premature beats; no murmurs, rubs, or gallops,no edema  Respiratory:  clear to auscultation bilaterally, normal work of breathing GI: soft, nontender, nondistended, + BS MS: no deformity or atrophy  Skin: warm and dry, no rash Neuro:  Strength and sensation are intact Psych: euthymic mood, full affect   EKG:   The ekg ordered today demonstrates atrial pacing   Recent Labs:  01/02/2017: BUN 11; Creatinine, Ser 1.06; Hemoglobin 13.9; Platelets 171; Potassium 4.1; Sodium 143   Lipid Panel    Component Value Date/Time   CHOL  05/08/2009 0337    156        ATP III CLASSIFICATION:  <200     mg/dL   Desirable  546-568  mg/dL   Borderline High  >=127    mg/dL   High          TRIG 73 05/08/2009 0337   HDL 42 05/08/2009 0337   CHOLHDL 3.7 05/08/2009 0337   VLDL 15 05/08/2009 0337   LDLCALC  05/08/2009 0337    99        Total Cholesterol/HDL:CHD Risk Coronary Heart Disease Risk Table                     Men   Women  1/2 Average Risk   3.4   3.3  Average Risk       5.0   4.4  2 X Average Risk   9.6   7.1  3 X Average Risk  23.4   11.0        Use the calculated Patient Ratio above and the CHD Risk Table to determine the patient's CHD Risk.        ATP III CLASSIFICATION (LDL):  <100     mg/dL   Optimal  517-001  mg/dL   Near or Above                    Optimal  130-159  mg/dL   Borderline  749-449  mg/dL   High  >675     mg/dL   Very High     Other studies Reviewed: Additional studies/ records that were reviewed today with results demonstrating: .   ASSESSMENT AND PLAN:  1. CAD: No angina on medical therapy.  Continue aggressive secondary prevention. 2. Pacer: Follows with EP.  3. Hyperlipidemia: Needs lipids rechecked.  He needs lipids and liver.  4. AFib: Xarelto for stroke prevention.  Has some hematuria.  May have to hold it if a cystocopy is  needed.   Current medicines are reviewed at length with the patient today.  The patient concerns regarding his medicines were addressed.  The following changes have been made:  No change  Labs/ tests ordered today include:  No orders of the defined types were placed in this encounter.   Recommend 150 minutes/week of aerobic exercise Low fat, low carb, high fiber diet recommended  Disposition:   FU in 1 year   Signed, Lance Muss, MD  11/17/2017 8:27 AM    [facility] [address] [place], [place], [place]  [address] Phone: [phone]; Fax: [phone]

## 2017-11-17 ENCOUNTER — Encounter: Payer: Self-pay | Admitting: Interventional Cardiology

## 2017-11-17 ENCOUNTER — Ambulatory Visit (INDEPENDENT_AMBULATORY_CARE_PROVIDER_SITE_OTHER): Payer: Medicare Other | Admitting: Interventional Cardiology

## 2017-11-17 VITALS — BP 124/80 | HR 85 | Ht 69.5 in | Wt 199.4 lb

## 2017-11-17 DIAGNOSIS — I10 Essential (primary) hypertension: Secondary | ICD-10-CM

## 2017-11-17 DIAGNOSIS — I2510 Atherosclerotic heart disease of native coronary artery without angina pectoris: Secondary | ICD-10-CM | POA: Diagnosis not present

## 2017-11-17 DIAGNOSIS — E782 Mixed hyperlipidemia: Secondary | ICD-10-CM

## 2017-11-17 DIAGNOSIS — I480 Paroxysmal atrial fibrillation: Secondary | ICD-10-CM

## 2017-11-17 LAB — HEPATIC FUNCTION PANEL
ALT: 15 IU/L (ref 0–44)
AST: 19 IU/L (ref 0–40)
Albumin: 4.1 g/dL (ref 3.5–4.7)
Alkaline Phosphatase: 79 IU/L (ref 39–117)
Bilirubin Total: 0.7 mg/dL (ref 0.0–1.2)
Bilirubin, Direct: 0.21 mg/dL (ref 0.00–0.40)
TOTAL PROTEIN: 6.6 g/dL (ref 6.0–8.5)

## 2017-11-17 LAB — LIPID PANEL
CHOLESTEROL TOTAL: 108 mg/dL (ref 100–199)
Chol/HDL Ratio: 1.9 ratio (ref 0.0–5.0)
HDL: 56 mg/dL (ref >39)
LDL Calculated: 39 mg/dL (ref 0–99)
TRIGLYCERIDES: 64 mg/dL (ref 0–149)
VLDL Cholesterol Cal: 13 mg/dL (ref 5–40)

## 2017-11-17 MED ORDER — ATORVASTATIN CALCIUM 10 MG PO TABS: 10.0000 mg | ORAL_TABLET | Freq: Every day | ORAL | 3 refills | Status: DC

## 2017-11-17 MED ORDER — NITROGLYCERIN 0.4 MG SL SUBL: 0.4000 mg | SUBLINGUAL_TABLET | SUBLINGUAL | 5 refills | Status: DC | PRN

## 2017-11-17 MED ORDER — FUROSEMIDE 40 MG PO TABS: ORAL_TABLET | ORAL | 3 refills | Status: DC

## 2017-11-17 NOTE — Patient Instructions (Signed)
Medication Instructions:  Your physician recommends that you continue on your current medications as directed. Please refer to the Current Medication list given to you today.   Labwork: TODAY: LIPIDS, LFTS  Testing/Procedures: None ordered  Follow-Up: Your physician wants you to follow-up in: 1 year with Dr. Varanasi. You will receive a reminder letter in the mail two months in advance. If you don't receive a letter, please call our office to schedule the follow-up appointment.   Any Other Special Instructions Will Be Listed Below (If Applicable).     If you need a refill on your cardiac medications before your next appointment, please call your pharmacy.   

## 2017-12-13 ENCOUNTER — Other Ambulatory Visit: Payer: Self-pay | Admitting: Interventional Cardiology

## 2017-12-13 NOTE — Telephone Encounter (Signed)
Medication Detail    Disp Refills Start End   atorvastatin (LIPITOR) 10 MG tablet 90 tablet 3 11/17/2017    Sig - Route: Take 1 tablet (10 mg total) by mouth daily at 6 PM. - Oral   Sent to pharmacy as: atorvastatin (LIPITOR) 10 MG tablet   E-Prescribing Status: Receipt confirmed by pharmacy (11/17/2017 8:39 AM EST)   Pharmacy   [id number] - [place], [place] - [address]

## 2017-12-21 ENCOUNTER — Telehealth: Payer: Self-pay | Admitting: Interventional Cardiology

## 2017-12-21 NOTE — Telephone Encounter (Signed)
   Primary Cardiologist: Lance Muss, MD  Chart reviewed as part of pre-operative protocol coverage. Patient was contacted 12/21/2017 in reference to pre-operative risk assessment for pending surgery as outlined below.  Gregory Mccormick was last seen on 11/17/2017 by Dr. Eldridge Dace.  Since that day, Gregory Mccormick has done well.  He denies chest pain or shortness of breath he has had no changes in his activity level or exercise tolerance.  Therefore, based on ACC/AHA guidelines, the patient would be at acceptable risk for the planned procedure without further cardiovascular testing.    The original message has been routed to the pharmacist so that they can address the Xarelto.  I will route this recommendation to the requesting party via Epic fax function and remove from pre-op pool.  Please call with questions.  Theodore Demark, PA-C 12/21/2017, 2:16 PM

## 2017-12-21 NOTE — Telephone Encounter (Signed)
° °  [facility] Pre-operative Risk Assessment    Request for surgical clearance:  What type of surgery is being performed?Turp 1. When is this surgery scheduled? Pending Clearance   2. What type of clearance is required (medical clearance vs. Pharmacy clearance to hold med vs. Both)?Both  3. Are there any medications that need to be held prior to surgery and how long?Xarelto (72 hrs )  Practice name and name of physician performing surgery?Dr. Gloriann Loan  4. What is your office phone and fax number? [phone], [phone]  Anesthesia type (None, local, MAC, general) ? General  Romana Juniper 12/21/2017, 10:23 AM  _________________________________________________________________   (provider comments below)

## 2017-12-21 NOTE — Telephone Encounter (Signed)
Pt takes Xarelto for afib with CHADS2VASc score of 5 (age x2, HTN, CAD, CHF). Renal function is normal. Ok to hold Xarelto 48-72 hours prior to higher bleed risk TURP procedure.

## 2017-12-22 ENCOUNTER — Encounter (HOSPITAL_BASED_OUTPATIENT_CLINIC_OR_DEPARTMENT_OTHER): Payer: Self-pay

## 2017-12-22 ENCOUNTER — Other Ambulatory Visit: Payer: Self-pay

## 2017-12-22 ENCOUNTER — Other Ambulatory Visit: Payer: Self-pay | Admitting: Urology

## 2017-12-22 NOTE — Progress Notes (Signed)
Spoke with:  [name] (Jerell's wife) NPO:  After Midnight, no gum, candy, or mints   Arrival time:  0700AM Labs: CBC, CMP drawn 12/26/2017 AM medications:  Metoprolol Pre op orders: Needs second sign Ride home:  [name] (wife) [phone]

## 2017-12-26 ENCOUNTER — Encounter (HOSPITAL_COMMUNITY)
Admission: RE | Admit: 2017-12-26 | Discharge: 2017-12-26 | Disposition: A | Discharge status: Home / Self Care | Payer: Medicare Other | Source: Ambulatory Visit | Attending: Urology | Admitting: Urology

## 2017-12-26 DIAGNOSIS — I4891 Unspecified atrial fibrillation: Secondary | ICD-10-CM | POA: Diagnosis not present

## 2017-12-26 DIAGNOSIS — Z96659 Presence of unspecified artificial knee joint: Secondary | ICD-10-CM | POA: Diagnosis not present

## 2017-12-26 DIAGNOSIS — R3912 Poor urinary stream: Secondary | ICD-10-CM | POA: Diagnosis not present

## 2017-12-26 DIAGNOSIS — M1990 Unspecified osteoarthritis, unspecified site: Secondary | ICD-10-CM | POA: Diagnosis not present

## 2017-12-26 DIAGNOSIS — Z950 Presence of cardiac pacemaker: Secondary | ICD-10-CM | POA: Diagnosis not present

## 2017-12-26 DIAGNOSIS — I10 Essential (primary) hypertension: Secondary | ICD-10-CM | POA: Diagnosis not present

## 2017-12-26 DIAGNOSIS — R3911 Hesitancy of micturition: Secondary | ICD-10-CM | POA: Diagnosis not present

## 2017-12-26 DIAGNOSIS — N401 Enlarged prostate with lower urinary tract symptoms: Secondary | ICD-10-CM | POA: Diagnosis not present

## 2017-12-26 DIAGNOSIS — R3914 Feeling of incomplete bladder emptying: Secondary | ICD-10-CM | POA: Diagnosis not present

## 2017-12-26 DIAGNOSIS — Z87891 Personal history of nicotine dependence: Secondary | ICD-10-CM | POA: Diagnosis not present

## 2017-12-26 DIAGNOSIS — Z7901 Long term (current) use of anticoagulants: Secondary | ICD-10-CM | POA: Diagnosis not present

## 2017-12-26 DIAGNOSIS — Z880 Allergy status to penicillin: Secondary | ICD-10-CM | POA: Diagnosis not present

## 2017-12-26 DIAGNOSIS — G4730 Sleep apnea, unspecified: Secondary | ICD-10-CM | POA: Diagnosis not present

## 2017-12-26 DIAGNOSIS — N400 Enlarged prostate without lower urinary tract symptoms: Secondary | ICD-10-CM | POA: Diagnosis present

## 2017-12-26 DIAGNOSIS — Z885 Allergy status to narcotic agent status: Secondary | ICD-10-CM | POA: Diagnosis not present

## 2017-12-26 DIAGNOSIS — R3915 Urgency of urination: Secondary | ICD-10-CM | POA: Diagnosis not present

## 2017-12-26 DIAGNOSIS — Z955 Presence of coronary angioplasty implant and graft: Secondary | ICD-10-CM | POA: Diagnosis not present

## 2017-12-26 DIAGNOSIS — N138 Other obstructive and reflux uropathy: Secondary | ICD-10-CM | POA: Diagnosis not present

## 2017-12-26 DIAGNOSIS — R351 Nocturia: Secondary | ICD-10-CM | POA: Diagnosis not present

## 2017-12-26 DIAGNOSIS — Z79899 Other long term (current) drug therapy: Secondary | ICD-10-CM | POA: Diagnosis not present

## 2017-12-26 DIAGNOSIS — I2510 Atherosclerotic heart disease of native coronary artery without angina pectoris: Secondary | ICD-10-CM | POA: Diagnosis not present

## 2017-12-26 DIAGNOSIS — R350 Frequency of micturition: Secondary | ICD-10-CM | POA: Diagnosis not present

## 2017-12-26 LAB — COMPREHENSIVE METABOLIC PANEL
ALT: 14 U/L — AB (ref 17–63)
AST: 22 U/L (ref 15–41)
Albumin: 3.9 g/dL (ref 3.5–5.0)
Alkaline Phosphatase: 72 U/L (ref 38–126)
Anion gap: 10 (ref 5–15)
BILIRUBIN TOTAL: 0.8 mg/dL (ref 0.3–1.2)
BUN: 13 mg/dL (ref 6–20)
CO2: 28 mmol/L (ref 22–32)
CREATININE: 0.99 mg/dL (ref 0.61–1.24)
Calcium: 9.1 mg/dL (ref 8.9–10.3)
Chloride: 104 mmol/L (ref 101–111)
GFR calc Af Amer: >60 mL/min (ref >60)
Glucose, Bld: 94 mg/dL (ref 65–99)
Potassium: 4.1 mmol/L (ref 3.5–5.1)
Sodium: 142 mmol/L (ref 135–145)
TOTAL PROTEIN: 6.9 g/dL (ref 6.5–8.1)

## 2017-12-26 LAB — CBC
HEMATOCRIT: 41.6 % (ref 39.0–52.0)
Hemoglobin: 13.4 g/dL (ref 13.0–17.0)
MCH: 30.9 pg (ref 26.0–34.0)
MCHC: 32.2 g/dL (ref 30.0–36.0)
MCV: 96.1 fL (ref 78.0–100.0)
Platelets: 158 10*3/uL (ref 150–400)
RBC: 4.33 MIL/uL (ref 4.22–5.81)
RDW: 14 % (ref 11.5–15.5)
WBC: 5.6 10*3/uL (ref 4.0–10.5)

## 2017-12-27 ENCOUNTER — Encounter (HOSPITAL_BASED_OUTPATIENT_CLINIC_OR_DEPARTMENT_OTHER)
Admission: RE | Disposition: A | Discharge status: Home / Self Care | Payer: Self-pay | Source: Ambulatory Visit | Attending: Urology

## 2017-12-27 ENCOUNTER — Observation Stay (HOSPITAL_BASED_OUTPATIENT_CLINIC_OR_DEPARTMENT_OTHER)
Admission: RE | Admit: 2017-12-27 | Discharge: 2017-12-28 | Disposition: A | Discharge status: Home / Self Care | Payer: Medicare Other | Source: Ambulatory Visit | Attending: Urology | Admitting: Urology

## 2017-12-27 ENCOUNTER — Ambulatory Visit (HOSPITAL_BASED_OUTPATIENT_CLINIC_OR_DEPARTMENT_OTHER): Payer: Medicare Other | Admitting: Anesthesiology

## 2017-12-27 ENCOUNTER — Other Ambulatory Visit: Payer: Self-pay

## 2017-12-27 ENCOUNTER — Encounter (HOSPITAL_BASED_OUTPATIENT_CLINIC_OR_DEPARTMENT_OTHER): Payer: Self-pay | Admitting: Certified Registered Nurse Anesthetist

## 2017-12-27 DIAGNOSIS — M1990 Unspecified osteoarthritis, unspecified site: Secondary | ICD-10-CM | POA: Insufficient documentation

## 2017-12-27 DIAGNOSIS — G4730 Sleep apnea, unspecified: Secondary | ICD-10-CM | POA: Insufficient documentation

## 2017-12-27 DIAGNOSIS — Z7901 Long term (current) use of anticoagulants: Secondary | ICD-10-CM | POA: Insufficient documentation

## 2017-12-27 DIAGNOSIS — Z79899 Other long term (current) drug therapy: Secondary | ICD-10-CM | POA: Insufficient documentation

## 2017-12-27 DIAGNOSIS — R351 Nocturia: Secondary | ICD-10-CM | POA: Insufficient documentation

## 2017-12-27 DIAGNOSIS — I4891 Unspecified atrial fibrillation: Secondary | ICD-10-CM | POA: Insufficient documentation

## 2017-12-27 DIAGNOSIS — N400 Enlarged prostate without lower urinary tract symptoms: Secondary | ICD-10-CM | POA: Diagnosis present

## 2017-12-27 DIAGNOSIS — Z950 Presence of cardiac pacemaker: Secondary | ICD-10-CM | POA: Insufficient documentation

## 2017-12-27 DIAGNOSIS — Z880 Allergy status to penicillin: Secondary | ICD-10-CM | POA: Insufficient documentation

## 2017-12-27 DIAGNOSIS — R3915 Urgency of urination: Secondary | ICD-10-CM | POA: Insufficient documentation

## 2017-12-27 DIAGNOSIS — Z87891 Personal history of nicotine dependence: Secondary | ICD-10-CM | POA: Insufficient documentation

## 2017-12-27 DIAGNOSIS — N138 Other obstructive and reflux uropathy: Secondary | ICD-10-CM | POA: Insufficient documentation

## 2017-12-27 DIAGNOSIS — Z96659 Presence of unspecified artificial knee joint: Secondary | ICD-10-CM | POA: Insufficient documentation

## 2017-12-27 DIAGNOSIS — R3914 Feeling of incomplete bladder emptying: Secondary | ICD-10-CM | POA: Insufficient documentation

## 2017-12-27 DIAGNOSIS — Z955 Presence of coronary angioplasty implant and graft: Secondary | ICD-10-CM | POA: Insufficient documentation

## 2017-12-27 DIAGNOSIS — I2510 Atherosclerotic heart disease of native coronary artery without angina pectoris: Secondary | ICD-10-CM | POA: Insufficient documentation

## 2017-12-27 DIAGNOSIS — Z885 Allergy status to narcotic agent status: Secondary | ICD-10-CM | POA: Insufficient documentation

## 2017-12-27 DIAGNOSIS — R350 Frequency of micturition: Secondary | ICD-10-CM | POA: Insufficient documentation

## 2017-12-27 DIAGNOSIS — I10 Essential (primary) hypertension: Secondary | ICD-10-CM | POA: Diagnosis not present

## 2017-12-27 DIAGNOSIS — N401 Enlarged prostate with lower urinary tract symptoms: Secondary | ICD-10-CM | POA: Diagnosis not present

## 2017-12-27 DIAGNOSIS — R3911 Hesitancy of micturition: Secondary | ICD-10-CM | POA: Insufficient documentation

## 2017-12-27 DIAGNOSIS — R3912 Poor urinary stream: Secondary | ICD-10-CM | POA: Insufficient documentation

## 2017-12-27 HISTORY — PX: TRANSURETHRAL RESECTION OF PROSTATE: SHX73

## 2017-12-27 HISTORY — DX: Epistaxis: R04.0

## 2017-12-27 SURGERY — TURP (TRANSURETHRAL RESECTION OF PROSTATE)
Anesthesia: General | Site: Prostate

## 2017-12-27 MED ORDER — ACETAMINOPHEN 325 MG PO TABS
650.0000 mg | ORAL_TABLET | ORAL | Status: DC | PRN
  Filled 2017-12-27: qty 2

## 2017-12-27 MED ORDER — BELLADONNA ALKALOIDS-OPIUM 16.2-60 MG RE SUPP
1.0000 | Freq: Four times a day (QID) | RECTAL | Status: DC | PRN
  Filled 2017-12-27: qty 1

## 2017-12-27 MED ORDER — OXYCODONE HCL 5 MG PO TABS
5.0000 mg | ORAL_TABLET | ORAL | Status: DC | PRN
  Filled 2017-12-27: qty 1

## 2017-12-27 MED ORDER — DEXAMETHASONE SODIUM PHOSPHATE 10 MG/ML IJ SOLN
INTRAMUSCULAR | Status: DC | PRN
  Administered 2017-12-27: 10 mg via INTRAVENOUS

## 2017-12-27 MED ORDER — CIPROFLOXACIN IN D5W 400 MG/200ML IV SOLN
INTRAVENOUS | Status: AC
  Filled 2017-12-27: qty 200

## 2017-12-27 MED ORDER — MORPHINE SULFATE (PF) 2 MG/ML IV SOLN
2.0000 mg | INTRAVENOUS | Status: DC | PRN
  Filled 2017-12-27: qty 2

## 2017-12-27 MED ORDER — SODIUM CHLORIDE 0.9 % IR SOLN
Status: DC | PRN
Start: 2017-12-27 — End: 2017-12-27
  Administered 2017-12-27: 6000 mL via INTRAVESICAL
  Administered 2017-12-27 (×4): 3000 mL via INTRAVESICAL

## 2017-12-27 MED ORDER — LIDOCAINE 2% (20 MG/ML) 5 ML SYRINGE
INTRAMUSCULAR | Status: AC
  Filled 2017-12-27: qty 5

## 2017-12-27 MED ORDER — LIDOCAINE 2% (20 MG/ML) 5 ML SYRINGE
INTRAMUSCULAR | Status: DC | PRN
  Administered 2017-12-27: 80 mg via INTRAVENOUS

## 2017-12-27 MED ORDER — ONDANSETRON HCL 4 MG/2ML IJ SOLN
INTRAMUSCULAR | Status: AC
  Filled 2017-12-27: qty 2

## 2017-12-27 MED ORDER — ACETAMINOPHEN 500 MG PO TABS
1000.0000 mg | ORAL_TABLET | Freq: Once | ORAL | Status: AC
  Administered 2017-12-27: 1000 mg via ORAL
  Filled 2017-12-27: qty 2

## 2017-12-27 MED ORDER — SENNOSIDES-DOCUSATE SODIUM 8.6-50 MG PO TABS
2.0000 | ORAL_TABLET | Freq: Every day | ORAL | Status: DC
  Administered 2017-12-27: 2 via ORAL
  Filled 2017-12-27 (×2): qty 2

## 2017-12-27 MED ORDER — CIPROFLOXACIN IN D5W 400 MG/200ML IV SOLN
400.0000 mg | Freq: Two times a day (BID) | INTRAVENOUS | Status: DC
  Administered 2017-12-27: 400 mg via INTRAVENOUS
  Filled 2017-12-27: qty 200

## 2017-12-27 MED ORDER — DIPHENHYDRAMINE HCL 12.5 MG/5ML PO ELIX
12.5000 mg | ORAL_SOLUTION | Freq: Four times a day (QID) | ORAL | Status: DC | PRN
  Filled 2017-12-27: qty 5

## 2017-12-27 MED ORDER — LACTATED RINGERS IV SOLN
INTRAVENOUS | Status: DC
  Administered 2017-12-27: 09:00:00 via INTRAVENOUS
  Filled 2017-12-27: qty 1000

## 2017-12-27 MED ORDER — FENTANYL CITRATE (PF) 100 MCG/2ML IJ SOLN
25.0000 ug | INTRAMUSCULAR | Status: DC | PRN
  Filled 2017-12-27: qty 1

## 2017-12-27 MED ORDER — HYDROCODONE-ACETAMINOPHEN 5-325 MG PO TABS: 1.0000 | ORAL_TABLET | ORAL | 0 refills | Status: DC | PRN

## 2017-12-27 MED ORDER — BACITRACIN-NEOMYCIN-POLYMYXIN 400-5-5000 EX OINT
1.0000 "application " | TOPICAL_OINTMENT | Freq: Three times a day (TID) | CUTANEOUS | Status: DC | PRN
  Filled 2017-12-27: qty 1

## 2017-12-27 MED ORDER — DIPHENHYDRAMINE HCL 50 MG/ML IJ SOLN
12.5000 mg | Freq: Four times a day (QID) | INTRAMUSCULAR | Status: DC | PRN
  Filled 2017-12-27: qty 0.25

## 2017-12-27 MED ORDER — FENTANYL CITRATE (PF) 100 MCG/2ML IJ SOLN
INTRAMUSCULAR | Status: DC | PRN
  Administered 2017-12-27 (×4): 25 ug via INTRAVENOUS

## 2017-12-27 MED ORDER — PROPOFOL 10 MG/ML IV BOLUS
INTRAVENOUS | Status: AC
  Filled 2017-12-27: qty 20

## 2017-12-27 MED ORDER — FENTANYL CITRATE (PF) 100 MCG/2ML IJ SOLN
INTRAMUSCULAR | Status: AC
  Filled 2017-12-27: qty 2

## 2017-12-27 MED ORDER — SODIUM CHLORIDE 0.9 % IV SOLN
INTRAVENOUS | Status: DC
  Administered 2017-12-27: 23:00:00 via INTRAVENOUS
  Filled 2017-12-27 (×2): qty 1000

## 2017-12-27 MED ORDER — FUROSEMIDE 40 MG PO TABS
40.0000 mg | ORAL_TABLET | Freq: Every day | ORAL | Status: DC
  Administered 2017-12-27: 40 mg via ORAL
  Filled 2017-12-27: qty 1

## 2017-12-27 MED ORDER — ONDANSETRON HCL 4 MG/2ML IJ SOLN
4.0000 mg | Freq: Once | INTRAMUSCULAR | Status: DC | PRN
  Filled 2017-12-27: qty 2

## 2017-12-27 MED ORDER — ONDANSETRON HCL 4 MG/2ML IJ SOLN
4.0000 mg | INTRAMUSCULAR | Status: DC | PRN
  Filled 2017-12-27: qty 2

## 2017-12-27 MED ORDER — ATORVASTATIN CALCIUM 10 MG PO TABS
10.0000 mg | ORAL_TABLET | Freq: Every day | ORAL | Status: DC
  Administered 2017-12-27: 10 mg via ORAL
  Filled 2017-12-27: qty 1

## 2017-12-27 MED ORDER — DEXAMETHASONE SODIUM PHOSPHATE 10 MG/ML IJ SOLN
INTRAMUSCULAR | Status: AC
  Filled 2017-12-27: qty 1

## 2017-12-27 MED ORDER — ONDANSETRON HCL 4 MG/2ML IJ SOLN
INTRAMUSCULAR | Status: DC | PRN
  Administered 2017-12-27: 4 mg via INTRAVENOUS

## 2017-12-27 MED ORDER — OXYBUTYNIN CHLORIDE 5 MG PO TABS
5.0000 mg | ORAL_TABLET | Freq: Three times a day (TID) | ORAL | Status: DC | PRN
  Filled 2017-12-27: qty 1

## 2017-12-27 MED ORDER — ACETAMINOPHEN 500 MG PO TABS
ORAL_TABLET | ORAL | Status: AC
  Filled 2017-12-27: qty 2

## 2017-12-27 MED ORDER — KETOROLAC TROMETHAMINE 30 MG/ML IJ SOLN
INTRAMUSCULAR | Status: AC
  Filled 2017-12-27: qty 1

## 2017-12-27 MED ORDER — PROPOFOL 10 MG/ML IV BOLUS
INTRAVENOUS | Status: DC | PRN
  Administered 2017-12-27: 150 mg via INTRAVENOUS

## 2017-12-27 MED ORDER — METOPROLOL TARTRATE 25 MG PO TABS
25.0000 mg | ORAL_TABLET | Freq: Two times a day (BID) | ORAL | Status: DC
  Administered 2017-12-27: 25 mg via ORAL
  Filled 2017-12-27: qty 1

## 2017-12-27 MED ORDER — DIPHENHYDRAMINE HCL 50 MG/ML IJ SOLN
INTRAMUSCULAR | Status: AC
  Filled 2017-12-27: qty 1

## 2017-12-27 SURGICAL SUPPLY — 26 items
BAG DRAIN URO-CYSTO SKYTR STRL (DRAIN) ×3 IMPLANT
BAG DRN UROCATH (DRAIN) ×1
BAG URINE DRAINAGE (UROLOGICAL SUPPLIES) ×3 IMPLANT
CATH FOLEY 2WAY SLVR  5CC 22FR (CATHETERS)
CATH FOLEY 2WAY SLVR 5CC 22FR (CATHETERS) IMPLANT
CATH HEMA 3WAY 30CC 22FR COUDE (CATHETERS) IMPLANT
CATH HEMA 3WAY 30CC 24FR RND (CATHETERS) ×2 IMPLANT
CLOTH BEACON ORANGE TIMEOUT ST (SAFETY) ×3 IMPLANT
COVER FOOTSWITCH UNIV (MISCELLANEOUS) ×3 IMPLANT
GLOVE BIO SURGEON STRL SZ 6.5 (GLOVE) ×1 IMPLANT
GLOVE BIO SURGEON STRL SZ7.5 (GLOVE) ×3 IMPLANT
GLOVE BIO SURGEONS STRL SZ 6.5 (GLOVE) ×1
GLOVE BIOGEL PI IND STRL 6.5 (GLOVE) IMPLANT
GLOVE BIOGEL PI INDICATOR 6.5 (GLOVE) ×2
GOWN STRL REUS W/TWL LRG LVL3 (GOWN DISPOSABLE) ×3 IMPLANT
GOWN STRL REUS W/TWL XL LVL3 (GOWN DISPOSABLE) ×3 IMPLANT
HOLDER FOLEY CATH W/STRAP (MISCELLANEOUS) ×3 IMPLANT
IV NS IRRIG 3000ML ARTHROMATIC (IV SOLUTION) ×18 IMPLANT
LOOP CUT BIPOLAR 24F LRG (ELECTROSURGICAL) ×3 IMPLANT
MANIFOLD NEPTUNE II (INSTRUMENTS) ×3 IMPLANT
PACK CYSTO (CUSTOM PROCEDURE TRAY) ×3 IMPLANT
SYR 30ML LL (SYRINGE) ×2 IMPLANT
SYRINGE IRR TOOMEY STRL 70CC (SYRINGE) ×3 IMPLANT
TUBE CONNECTING 12'X1/4 (SUCTIONS) ×1
TUBE CONNECTING 12X1/4 (SUCTIONS) ×2 IMPLANT
WATER STERILE IRR 500ML POUR (IV SOLUTION) ×2 IMPLANT

## 2017-12-27 NOTE — Transfer of Care (Signed)
Immediate Anesthesia Transfer of Care Note  Patient: Gregory Mccormick  Procedure(s) Performed: TRANSURETHRAL RESECTION OF THE PROSTATE (TURP) (N/A Prostate)  Patient Location: PACU  Anesthesia Type:General  Level of Consciousness: awake, alert  and oriented  Airway & Oxygen Therapy: Patient Spontanous Breathing and Patient connected to face mask oxygen  Post-op Assessment: Report given to RN and Post -op Vital signs reviewed and stable  Post vital signs: Reviewed and stable  Last Vitals:  Vitals:   12/27/17 0715  BP: (!) 157/96  Pulse: 88  Resp: 16  Temp: 36.7 C  SpO2: 97%    Last Pain:  Vitals:   12/27/17 0715  TempSrc: Oral         Complications: No apparent anesthesia complications

## 2017-12-27 NOTE — Interval H&P Note (Signed)
History and Physical Interval Note:  12/27/2017 9:22 AM  Gregory Mccormick  has presented today for surgery, with the diagnosis of BENIGN PROSTATE HYPERPLASIA  The various methods of treatment have been discussed with the patient and family. After consideration of risks, benefits and other options for treatment, the patient has consented to  Procedure(s): TRANSURETHRAL RESECTION OF THE PROSTATE (TURP) (N/A) as a surgical intervention .  The patient's history has been reviewed, patient examined, no change in status, stable for surgery.  I have reviewed the patient's chart and labs.  Questions were answered to the patient's satisfaction.     Ray Church, III

## 2017-12-27 NOTE — Progress Notes (Signed)
12/27/2017 6:32 PM RN called to room to witness pt. Taking evening medications. During conversation, pt. Mentioned to RN that he had also taken his Flomax and Proscar at 1600 without notifying RN. Education provided to patient to please not take any medications from home without verifying appropriateness with RN first. Pt. Verbalized understanding. Will continue to closely monitor patient.  Lavren Lewan, Blanchard Kelch

## 2017-12-27 NOTE — Anesthesia Postprocedure Evaluation (Signed)
Anesthesia Post Note  Patient: Gregory Mccormick  Procedure(s) Performed: TRANSURETHRAL RESECTION OF THE PROSTATE (TURP) (N/A Prostate)     Patient location during evaluation: PACU Anesthesia Type: General Level of consciousness: awake and alert, awake, oriented and patient cooperative Pain management: pain level controlled Vital Signs Assessment: post-procedure vital signs reviewed and stable Respiratory status: spontaneous breathing, nonlabored ventilation and respiratory function stable Cardiovascular status: blood pressure returned to baseline and stable Postop Assessment: no apparent nausea or vomiting Anesthetic complications: no    Last Vitals:  Vitals:   12/27/17 1115 12/27/17 1130  BP: (!) 149/82 (!) 142/80  Pulse: (!) 50 (!) 44  Resp: 15 14  Temp:    SpO2: 100% 100%    Last Pain:  Vitals:   12/27/17 0715  TempSrc: Oral                 Cecile Hearing

## 2017-12-27 NOTE — Op Note (Signed)
Preoperative diagnosis: 1. Bladder outlet obstruction secondary to BPH  Postoperative diagnosis:  1. Bladder outlet obstruction secondary to BPH  Procedure:  1. Cystoscopy 2. Transurethral resection of the prostate  Surgeon: Ray Church, III. M.D.  Anesthesia: General  Complications: None  EBL: Minimal  Specimens: 1. Prostate chips  Indication: Gregory Mccormick is a patient with bladder outlet obstruction secondary to benign prostatic hyperplasia. After reviewing the management options for treatment, he elected to proceed with the above surgical procedure(s). We have discussed the potential benefits and risks of the procedure, side effects of the proposed treatment, the likelihood of the patient achieving the goals of the procedure, and any potential problems that might occur during the procedure or recuperation. Informed consent has been obtained.  Description of procedure:  The patient was taken to the operating room and general anesthesia was induced.  The patient was placed in the dorsal lithotomy position, prepped and draped in the usual sterile fashion, and preoperative antibiotics were administered. A preoperative time-out was performed.   Cystourethroscopy was performed.  The patient's urethra was examined and was normal/ demonstrated trilobar prostatic hypertrophy with a large intravesical median lobe.   The bladder was then systematically examined in its entirety. There was no evidence of any bladder tumors, stones, or other mucosal pathology.  There was significant trabeculation.  The ureteral orifices were identified and marked so as to be avoided during the procedure.  The prostate adenoma was then resected utilizing loop cautery resection with the bipolar cutting loop.  The prostate adenoma from the bladder neck back to the verumontanum was resected beginning with resection of the median lobe followed by resection at the six o'clock position and then extended to  include the right and left lobes of the prostate and anterior prostate. Care was taken not to resect distal to the verumontanum.  Hemostasis was then achieved with the cautery and the bladder was emptied and prostate chips were collected then the bladder was reinspected with no significant bleeding noted at the end of the procedure.    A 24 French 3 way catheter was then placed into the bladder.  Continuous bladder irrigation was started.  The patient appeared to tolerate the procedure well and without complications.  The patient was able to be awakened and transferred to the recovery unit in satisfactory condition.

## 2017-12-27 NOTE — H&P (Signed)
CC: BPH  HPI: Gregory Mccormick is a 84 year-old male established patient who is here for follow up regarding further evaluation of BPH and lower urinary tract symptoms.  The patient complains of lower urinary tract symptom(s) that include frequency, urgency, hesitancy, weak stream, and nocturia. The patient states his most bothersome symptom(s) are the following: nocturia. His symptoms have been stable over the last year.   08/25/17:  The patient has no further hematuria. CT IVP on 08/21/2017 revealed no explanation for his hematuria. He did have a severely trabeculated bladder with enlarged prostate. He has no complaints today. He presents for cystoscopy. He does have some obstructive voiding complaints today. He mainly complains of weak stream, hesitancy, intermittency, frequency, nocturia. He has not tried the Flomax yet.   10/12/17  The patient has had no further hematuria. He has been taking Flomax and finasteride. He states that this has improved his nocturia from every 30 minutes down to 2-4 times a night. He does still experience some intermittency and hesitancy as well as a weaker stream. He does feel like his symptoms have improved to the point where he does not want to consider surgery currently but he will continue to think about that option given his symptoms are persisting. The patient also complains about some dysuria with voiding.   12/20/17  The patient continues today Flomax and finasteride. He has not had any gross hematuria but he continues to have significant voiding symptoms including frequency, urgency, occasional urge urinary incontinence, nocturia every 2 hours, weak stream, intermittency, hesitancy. He is very bothered by this. He is on xarelto for history of coronary artery disease and has a history of cardiac bypass. He also has a pacemaker. He greatly desires intervention for his voiding symptoms. He has a cystoscopy in the past that showed a large median lobe and trilobar  hypertrophy.     AUA Symptom Score: He has to get up to urinate 4 times from the time he goes to bed until the time he gets up in the morning.     ALLERGIES: penicillin     MEDICATIONS: Finasteride 5 mg tablet 1 tablet PO Daily  Metoprolol Tartrate 25 mg tablet  Atorvastatin Calcium 10 mg tablet  Fish Oil  Furosemide 40 mg tablet  Preservision Areds  Vitamin D3  Xarelto 20 mg tablet     GU PSH: Cystoscopy - 08/25/2017      PSH Notes: ear surgery     NON-GU PSH: Cardiac Stent Placement Knee replacement Pacemaker placement Shoulder Surgery (Unspecified)    GU PMH: Acute prostatitis - 10/19/2017 BPH w/LUTS - 10/19/2017, - 10/12/2017, - 08/11/2017 Dysuria - 10/12/2017 Gross hematuria - 08/25/2017, - 08/11/2017    NON-GU PMH: Coronary Artery Disease Sleep Apnea    FAMILY HISTORY: Cancer - Mother Heart problem - Father Kidney Failure - Brother   SOCIAL HISTORY: Marital Status: Married Preferred Language: English; Race: White Current Smoking Status: Patient does not smoke anymore.   Tobacco Use Assessment Completed: Used Tobacco in last 30 days? Does not drink caffeine.     Notes: 3 sons    REVIEW OF SYSTEMS:    GU Review Male:   Patient reports frequent urination, hard to postpone urination, get up at night to urinate, leakage of urine, stream starts and stops, and trouble starting your stream. Patient denies burning/ pain with urination, have to strain to urinate , erection problems, and penile pain.  Gastrointestinal (Upper):   Patient denies nausea, vomiting, and indigestion/ heartburn.  Gastrointestinal (  Lower):   Patient denies diarrhea and constipation.  Constitutional:   Patient denies fever, night sweats, fatigue, and weight loss.  Skin:   Patient denies skin rash/ lesion and itching.  Eyes:   Patient denies blurred vision and double vision.  Ears/ Nose/ Throat:   Patient denies sore throat and sinus problems.  Hematologic/Lymphatic:   Patient denies  swollen glands and easy bruising.  Cardiovascular:   Patient reports leg swelling. Patient denies chest pains.  Respiratory:   Patient denies cough and shortness of breath.  Endocrine:   Patient denies excessive thirst.  Musculoskeletal:   Patient denies back pain and joint pain.  Neurological:   Patient denies headaches and dizziness.  Psychologic:   Patient denies depression and anxiety.   VITAL SIGNS:      12/20/2017 01:10 PM  BP 150/70 mmHg  Pulse 38 /min  Temperature 97.3 F / 36.2 C   MULTI-SYSTEM PHYSICAL EXAMINATION:    Constitutional: Well-nourished. No physical deformities. Normally developed. Good grooming.  Respiratory: No labored breathing, no use of accessory muscles.   Cardiovascular: Normal temperature, evidence of adequate peripheral perfusion of extremities  Skin: No paleness, no jaundice  Neurologic / Psychiatric: Oriented to time, oriented to place, oriented to person. No depression, no anxiety, no agitation.  Gastrointestinal: No mass, no tenderness, no rigidity, non obese abdomen.  Eyes: Normal conjunctivae. Normal eyelids.  Musculoskeletal: Normal gait and station of head and neck.     PAST DATA REVIEWED:  Source Of History:  Patient   PROCEDURES:           PVR Ultrasound - 92446  Scanned Volume: 391 cc         Urinalysis Dipstick Dipstick Cont'd  Color: Straw Bilirubin: Neg  Appearance: Clear Ketones: Neg  Specific Gravity: 1.015 Blood: Neg  pH: 5.5 Protein: Neg  Glucose: Neg Urobilinogen: 0.2    Nitrites: Neg    Leukocyte Esterase: Neg    ASSESSMENT:      ICD-10 Details  1 GU:   BPH w/LUTS - N40.1   2   Nocturia - R35.1   3   Weak Urinary Stream - R39.12   4   Incomplete bladder emptying - R39.14   5   Urinary Urgency - R39.15    PLAN:            Medications Refill Meds: Tamsulosin Hcl 0.4 mg capsule 2 capsule PO Daily   #60  6 Refill(s)            Document Letter(s):  Created for Patient: Clinical Summary         Notes:    Increase Flomax to 0.8 mg daily and continue the finasteride   He greatly desires intervention. He knows that surgery as an increased risk of cardiac events. We'll have to get cardiac clearance before proceeding.   The patient has failed medical management for his lower urinary tract symptoms. He would like to proceed with surgical resection. I discussed bipolar transurethral resection of the prostate. I specifically discussed the risks including but not limited to bleeding which could require blood transfusion, infection, and injury to surrounding structures. Also discussed the possibility that the surgery would not improve symptoms though most men have a great improvement in their symptoms. Also discussed the low likelihood but possibility of development of new symptoms such as irritative voiding symptoms or urinary incontinence. Most men will have some degree of urinary urgency and discomfort immediately following the surgery that resolves in a short amount  of time. He understands that most often this is an outpatient procedure but occasionally patients require hospitalization for continuous bladder irrigation in the event of excess bleeding. He also understands the possibility of being sent home with a urethral catheter. The patient expressed understanding and is eager to proceed.      Signed by Modena Slater, III, M.D. on 12/20/17 at 1:52 PM (EST

## 2017-12-27 NOTE — Anesthesia Preprocedure Evaluation (Addendum)
Anesthesia Evaluation  Patient identified by MRN, date of birth, ID band Patient awake    Reviewed: Allergy & Precautions, NPO status , Patient's Chart, lab work & pertinent test results, reviewed documented beta blocker date and time   History of Anesthesia Complications (+) PONV and history of anesthetic complications  Airway Mallampati: II  TM Distance: >3 FB Neck ROM: Full    Dental  (+) Dental Advisory Given, Upper Dentures   Pulmonary sleep apnea , former smoker,    Pulmonary exam normal breath sounds clear to auscultation       Cardiovascular hypertension, Pt. on home beta blockers + CAD and + Cardiac Stents (stents to the LAD and circumflex in 2014 )  + dysrhythmias (Paroxysmal VT) Atrial Fibrillation + pacemaker  Rhythm:Regular Rate:Abnormal  Atrial pacing   Echo 11/25/15: Study Conclusions  - Left ventricle: The cavity size was normal. Wall thickness was increased in a pattern of mild LVH. Systolic function was normal. The estimated ejection fraction was in the range of 55% to 60%. Wall motion was normal; there were no regional wall motion abnormalities. Doppler parameters are consistent with abnormal left ventricular relaxation (grade 1 diastolic dysfunction). The E/e&' ratio is between 8-15, suggesting indeterminate LV filling pressure. - Aortic valve: Trileaflet; mildly calcified leaflets. Mild   stenosis. Mean gradient (S): 10 mm Hg. Peak gradient (S): 18 mm Hg. Valve area (VTI): 1.76 cm^2. Valve area (Vmax): 1.71 cm^2. Valve area (Vmean): 1.84 cm^2. - Mitral valve: Mildly thickened leaflets . There was trivial   regurgitation. - Right ventricle: The cavity size was mildly dilated. Wall   thickness was normal. The moderator band was prominent. Pacer wire or catheter noted in right ventricle. Systolic function was normal. - Right atrium: The atrium was mildly dilated. Pacer wire or catheter noted in right atrium. -  Tricuspid valve: There was mild regurgitation. - Pulmonary arteries: PA peak pressure: 26 mm Hg (S). - Inferior vena cava: The vessel was normal in size. The   respirophasic diameter changes were in the normal range (= 50%), consistent with normal central venous pressure.   Neuro/Psych negative neurological ROS  negative psych ROS   GI/Hepatic negative GI ROS, Neg liver ROS,   Endo/Other  negative endocrine ROS  Renal/GU negative Renal ROS     Musculoskeletal  (+) Arthritis , Osteoarthritis,    Abdominal   Peds  Hematology  (+) Blood dyscrasia (Xarelto), ,   Anesthesia Other Findings Day of surgery medications reviewed with the patient.  Reproductive/Obstetrics                            Anesthesia Physical Anesthesia Plan  ASA: III  Anesthesia Plan: General   Post-op Pain Management:    Induction: Intravenous  PONV Risk Score and Plan: 4 or greater and Dexamethasone, Ondansetron and Treatment may vary due to age or medical condition  Airway Management Planned: LMA  Additional Equipment:   Intra-op Plan:   Post-operative Plan: Extubation in OR  Informed Consent: I have reviewed the patients History and Physical, chart, labs and discussed the procedure including the risks, benefits and alternatives for the proposed anesthesia with the patient or authorized representative who has indicated his/her understanding and acceptance.   Dental advisory given  Plan Discussed with: CRNA  Anesthesia Plan Comments:         Anesthesia Quick Evaluation

## 2017-12-27 NOTE — Anesthesia Procedure Notes (Signed)
Procedure Name: LMA Insertion Date/Time: 12/27/2017 9:43 AM Performed by: Pearson Grippe, CRNA Pre-anesthesia Checklist: Patient identified, Emergency Drugs available, Suction available and Patient being monitored Patient Re-evaluated:Patient Re-evaluated prior to induction Oxygen Delivery Method: Circle system utilized Preoxygenation: Pre-oxygenation with 100% oxygen Induction Type: IV induction Ventilation: Mask ventilation without difficulty LMA: LMA inserted LMA Size: 5.0 Number of attempts: 1 Airway Equipment and Method: Bite block Placement Confirmation: positive ETCO2 Tube secured with: Tape Dental Injury: Teeth and Oropharynx as per pre-operative assessment

## 2017-12-27 NOTE — Discharge Instructions (Addendum)
Transurethral Resection of the Prostate (TURP) or Greenlight laser ablation of the Prostate  Do not resume Xarelto until urine is clear for at least 3 days. Care After  Refer to this sheet in the next few weeks. These discharge instructions provide you with general information on caring for yourself after you leave the hospital. Your caregiver may also give you specific instructions. Your treatment has been planned according to the most current medical practices available, but unavoidable complications sometimes occur. If you have any problems or questions after discharge, please call your caregiver.  HOME CARE INSTRUCTIONS   Medications  You may receive medicine for pain management. As your level of discomfort decreases, adjustments in your pain medicines may be made.   Take all medicines as directed.   You may be given a medicine (antibiotic) to kill germs following surgery. Finish all medicines. Let your caregiver know if you have any side effects or problems from the medicine.   If you are on aspirin, it would be best not to restart the aspirin until the blood in the urine clears Hygiene  You can take a shower after surgery.   You should not take a bath while you still have the urethral catheter. Activity  You will be encouraged to get out of bed as much as possible and increase your activity level as tolerated.   Spend the first week in and around your home. For 3 weeks, avoid the following:   Straining.   Running.   Strenuous work.   Walks longer than a few blocks.   Riding for extended periods.   Sexual relations.   Do not lift heavy objects (more than 20 pounds) for at least 1 month. When lifting, use your arms instead of your abdominal muscles.   You will be encouraged to walk as tolerated. Do not exert yourself. Increase your activity level slowly. Remember that it is important to keep moving after an operation of any type. This cuts down on the possibility of  developing blood clots.   Your caregiver will tell you when you can resume driving and light housework. Discuss this at your first office visit after discharge. Diet  No special diet is ordered after a TURP. However, if you are on a special diet for another medical problem, it should be continued.   Normal fluid intake is usually recommended.   Avoid alcohol and caffeinated drinks for 2 weeks. They irritate the bladder. Decaffeinated drinks are okay.   Avoid spicy foods.  Bladder Function  For the first 10 days, empty the bladder whenever you feel a definite desire. Do not try to hold the urine for long periods of time.   Urinating once or twice a night even after you are healed is not uncommon.   You may see some recurrence of blood in the urine after discharge from the hospital. This usually happens within 2 weeks after the procedure.If this occurs, force fluids again as you did in the hospital and reduce your activity.  Bowel Function  You may experience some constipation after surgery. This can be minimized by increasing fluids and fiber in your diet. Drink enough water and fluids to keep your urine clear or pale yellow.   A stool softener may be prescribed for use at home. Do not strain to move your bowels.   If you are requiring increased pain medicine, it is important that you take stool softeners to prevent constipation. This will help to promote proper healing by reducing the  need to strain to move your bowels.  Sexual Activity  Semen movement in the opposite direction and into the bladder (retrograde ejaculation) may occur. Since the semen passes into the bladder, cloudy urine can occur the first time you urinate after intercourse. Or, you may not have an ejaculation during erection. Ask your caregiver when you can resume sexual activity. Retrograde ejaculation and reduced semen discharge should not reduce one's pleasure of intercourse.  Postoperative Visit  Arrange the date  and time of your after surgery visit with your caregiver.  Return to Work  After your recovery is complete, you will be able to return to work and resume all activities. Your caregiver will inform you when you can return to work.    Foley Catheter Care A soft, flexible tube (Foley catheter) may have been placed in your bladder to drain urine and fluid. Follow these instructions: Taking Care of the Catheter  Keep the area where the catheter leaves your body clean.   Attach the catheter to the leg so there is no tension on the catheter.   Keep the drainage bag below the level of the bladder, but keep it OFF the floor.   Do not take long soaking baths. Your caregiver will give instructions about showering.   Wash your hands before touching ANYTHING related to the catheter or bag.   Using mild soap and warm water on a washcloth:   Clean the area closest to the catheter insertion site using a circular motion around the catheter.   Clean the catheter itself by wiping AWAY from the insertion site for several inches down the tube.   NEVER wipe upward as this could sweep bacteria up into the urethra (tube in your body that normally drains the bladder) and cause infection.   Place a small amount of sterile lubricant at the tip of the penis where the catheter is entering.  Taking Care of the Drainage Bags  Two drainage bags may be taken home: a large overnight drainage bag, and a smaller leg bag which fits underneath clothing.   It is okay to wear the overnight bag at any time, but NEVER wear the smaller leg bag at night.   Keep the drainage bag well below the level of your bladder. This prevents backflow of urine into the bladder and allows the urine to drain freely.   Anchor the tubing to your leg to prevent pulling or tension on the catheter. Use tape or a leg strap provided by the hospital.   Empty the drainage bag when it is 1/2 to 3/4 full. Wash your hands before and after touching  the bag.   Periodically check the tubing for kinks to make sure there is no pressure on the tubing which could restrict the flow of urine.  Changing the Drainage Bags  Cleanse both ends of the clean bag with alcohol before changing.   Pinch off the rubber catheter to avoid urine spillage during the disconnection.   Disconnect the dirty bag and connect the clean one.   Empty the dirty bag carefully to avoid a urine spill.   Attach the new bag to the leg with tape or a leg strap.  Cleaning the Drainage Bags  Whenever a drainage bag is disconnected, it must be cleaned quickly so it is ready for the next use.   Wash the bag in warm, soapy water.   Rinse the bag thoroughly with warm water.   Soak the bag for 30 minutes in a  solution of white vinegar and water (1 cup vinegar to 1 quart warm water).   Rinse with warm water.  SEEK MEDICAL CARE IF:   You have chills or night sweats.   You are leaking around your catheter or have problems with your catheter. It is not uncommon to have sporadic leakage around your catheter as a result of bladder spasms. If the leakage stops, there is not much need for concern. If you are uncertain, call your caregiver.   You develop side effects that you think are coming from your medicines.  SEEK IMMEDIATE MEDICAL CARE IF:   You are suddenly unable to urinate. Check to see if there are any kinks in the drainage tubing that may cause this. If you cannot find any kinks, call your caregiver immediately. This is an emergency.   You develop shortness of breath or chest pains.   Bleeding persists or clots develop in your urine.   You have a fever.   You develop pain in your back or over your lower belly (abdomen).   You develop pain or swelling in your legs.   Any problems you are having get worse rather than better.  MAKE SURE YOU:   Understand these instructions.   Will watch your condition.   Will get help right away if you are not doing well  or get worse.        Indwelling Urinary Catheter Care, Adult Take good care of your catheter to keep it working and to prevent problems. How to wear your catheter Attach your catheter to your leg with tape (adhesive tape) or a leg strap. Make sure it is not too tight. If you use tape, remove any bits of tape that are already on the catheter. How to wear a drainage bag You should have:  A large overnight bag.  A small leg bag.  Overnight Bag You may wear the overnight bag at any time. Always keep the bag below the level of your bladder but off the floor. When you sleep, put a clean plastic bag in a wastebasket. Then hang the bag inside the wastebasket. Leg Bag Never wear the leg bag at night. Always wear the leg bag below your knee. Keep the leg bag secure with a leg strap or tape. How to care for your skin  Clean the skin around the catheter at least once every day.  Shower every day. Do not take baths.  Put creams, lotions, or ointments on your genital area only as told by your doctor.  Do not use powders, sprays, or lotions on your genital area. How to clean your catheter and your skin 1. Wash your hands with soap and water. 2. Wet a washcloth in warm water and gentle (mild) soap. 3. Use the washcloth to clean the skin where the catheter enters your body. Clean downward and wipe away from the catheter in small circles. Do not wipe toward the catheter. 4. Pat the area dry with a clean towel. Make sure to clean off all soap. How to care for your drainage bags Empty your drainage bag when it is ?- full or at least 2-3 times a day. Replace your drainage bag once a month or sooner if it starts to smell bad or look dirty. Do not clean your drainage bag unless told by your doctor. Emptying a drainage bag  Supplies Needed  Rubbing alcohol.  Gauze pad or cotton ball.  Tape or a leg strap.  Steps 1. Wash your hands  with soap and water. 2. Separate (detach) the bag from your  leg. 3. Hold the bag over the toilet or a clean container. Keep the bag below your hips and bladder. This stops pee (urine) from going back into the tube. 4. Open the pour spout at the bottom of the bag. 5. Empty the pee into the toilet or container. Do not let the pour spout touch any surface. 6. Put rubbing alcohol on a gauze pad or cotton ball. 7. Use the gauze pad or cotton ball to clean the pour spout. 8. Close the pour spout. 9. Attach the bag to your leg with tape or a leg strap. 10. Wash your hands.  Changing a drainage bag Supplies Needed  Alcohol wipes.  A clean drainage bag.  Adhesive tape or a leg strap.  Steps 1. Wash your hands with soap and water. 2. Separate the dirty bag from your leg. 3. Pinch the rubber catheter with your fingers so that pee does not spill out. 4. Separate the catheter tube from the drainage tube where these tubes connect (at the connection valve). Do not let the tubes touch any surface. 5. Clean the end of the catheter tube with an alcohol wipe. Use a different alcohol wipe to clean the end of the drainage tube. 6. Connect the catheter tube to the drainage tube of the clean bag. 7. Attach the new bag to the leg with adhesive tape or a leg strap. 8. Wash your hands.  How to prevent infection and other problems  Never pull on your catheter or try to remove it. Pulling can damage tissue in your body.  Always wash your hands before and after touching your catheter.  If a leg strap gets wet, replace it with a dry one.  Drink enough fluids to keep your pee clear or pale yellow, or as told by your doctor.  Do not let the drainage bag or tubing touch the floor.  Wear cotton underwear.  If you are male, wipe from front to back after you poop (have a bowel movement).  Check on the catheter often to make sure it works and the tubing is not twisted. Get help if:  Your pee is cloudy.  Your pee smells unusually bad.  Your pee is not  draining into the bag.  Your tube gets clogged.  Your catheter starts to leak.  Your bladder feels full. Get help right away if:  You have redness, swelling, or pain where the catheter enters your body.  You have fluid, pus, or a bad smell coming from the area where the catheter enters your body.  The area where the catheter enters your body feels warm.  You have a fever.  You have pain in your: ? Stomach (abdomen). ? Legs. ? Lower back. ? Bladder.  You see blood fill the catheter.  Your pee is pink or red.  You feel sick to your stomach (nauseous).  You throw up (vomit).  You have chills.  Your catheter gets pulled out. This information is not intended to replace advice given to you by your health care provider. Make sure you discuss any questions you have with your health care provider. Document Released: 02/11/2013 Document Revised: 09/14/2016 Document Reviewed: 04/01/2014 Elsevier Interactive Patient Education  [employer].

## 2017-12-28 ENCOUNTER — Encounter (HOSPITAL_BASED_OUTPATIENT_CLINIC_OR_DEPARTMENT_OTHER): Payer: Self-pay | Admitting: Urology

## 2017-12-28 DIAGNOSIS — N401 Enlarged prostate with lower urinary tract symptoms: Secondary | ICD-10-CM | POA: Diagnosis not present

## 2017-12-28 LAB — HEMOGLOBIN AND HEMATOCRIT, BLOOD
HEMATOCRIT: 37.3 % — AB (ref 39.0–52.0)
Hemoglobin: 12.2 g/dL — ABNORMAL LOW (ref 13.0–17.0)

## 2017-12-28 NOTE — Discharge Summary (Signed)
Physician Discharge Summary  Patient ID: Gregory Mccormick MRN: [id number] DOB/AGE: [birth year] 84 y.o.  Admit date: 12/27/2017 Discharge date: 12/28/2017  Admission Diagnoses:  Discharge Diagnoses:  Active Problems:   BPH (benign prostatic hyperplasia)   Discharged Condition: good  Hospital Course: 84 year old male with BPH underwent a TURP.  He was weaned off CBI the following day.  He was stable for discharge.  Consults: None  Significant Diagnostic Studies: None  Treatments: surgery: TURP  Discharge Exam: Blood pressure (!) 153/71, pulse 62, temperature 98.6 F (37 C), temperature source Oral, resp. rate 18, height 5\' 10"  (1.778 m), weight 89.9 kg (198 lb 4.8 oz), SpO2 96 %. General appearance: alert no acute distress Adequate peripheral perfusion Abdomen soft nontender nondistended Urine light pink off CBI  Disposition: 01-Home or Self Care   Allergies as of 12/28/2017      Reactions   Penicillins Hives   Has patient had a PCN reaction causing immediate rash, facial/tongue/throat swelling, SOB or lightheadedness with hypotension: YES Has patient had a PCN reaction causing severe rash involving mucus membranes or skin necrosis: No Has patient had a PCN reaction that required hospitalization No Has patient had a PCN reaction occurring within the last 10 years: No If all of the above answers are "NO", then may proceed with Cephalosporin use.   Hydromorphone Hcl Nausea And Vomiting      Medication List    STOP taking these medications   finasteride 5 MG tablet Commonly known as:  PROSCAR   tamsulosin 0.4 MG Caps capsule Commonly known as:  FLOMAX   XARELTO 20 MG Tabs tablet Generic drug:  rivaroxaban     TAKE these medications   acetaminophen 500 MG tablet Commonly known as:  TYLENOL Take 1,000 mg by mouth at bedtime as needed for mild pain. For leg pain.   atorvastatin 10 MG tablet Commonly known as:  LIPITOR Take 1 tablet (10 mg total) by mouth daily at 6  PM.   FISH OIL PO Take 1 tablet by mouth daily.   furosemide 40 MG tablet Commonly known as:  LASIX TAKE 1 TABLET (40 MG TOTAL) BY MOUTH DAILY.   HYDROcodone-acetaminophen 5-325 MG tablet Commonly known as:  NORCO Take 1 tablet by mouth every 4 (four) hours as needed for moderate pain.   metoprolol tartrate 25 MG tablet Commonly known as:  LOPRESSOR Take 1 tablet (25 mg total) by mouth 2 (two) times daily.   MOVE FREE PO Take 1 tablet by mouth daily.   nitroGLYCERIN 0.4 MG SL tablet Commonly known as:  NITROSTAT Place 1 tablet (0.4 mg total) under the tongue every 5 (five) minutes as needed for chest pain.   PRESERVISION AREDS 2 PO Take 2 capsules by mouth daily. 2 tabs daily   Vitamin D3 5000 units Caps Take 1 capsule by mouth daily.        Signed: Ray Church, III 12/28/2017, 8:02 AM

## 2017-12-28 NOTE — Progress Notes (Signed)
Urinary catheter Standard drainage bag changed to leg bag. Teaching done w/ pt and spouse. Teachback method and explanation. Verbalized understanding. Marvia Pickles, RN

## 2018-01-04 NOTE — Progress Notes (Signed)
Electrophysiology Office Note Date: 01/05/2018  ID:  Gregory Mccormick, DOB 02-23-1929, MRN [id number]  PCP: Antony Contras, MD Primary Cardiologist: Beau Fanny Electrophysiologist: Allred  CC: Pacemaker follow-up  Gregory Mccormick is a 84 y.o. male seen today for Dr Rayann Heman.  He presents today for routine electrophysiology followup.  Since last being seen in our clinic, the patient reports doing reasonably well. He has recently undergone TURP and had to have catheter replaced 2/2 bleeding after he resumed Xarelto.  He is currently holding Xarelto until next Wednesday when seen by urology.   He denies chest pain, palpitations, dyspnea, PND, orthopnea, nausea, vomiting, dizziness, syncope, edema, weight gain, or early satiety.  Device History: MDT dual chamber PPM implanted 2011 for Mobitz II   Past Medical History:  Diagnosis Date  . Anemia, unspecified   . Arthritis    "right knee" (03/28/2013  . CAD in native artery    takes Xarelto daily  . Cardiomyopathy ([place])    EF 40%:  ECHO 11/25/2015 EF 55%-60%  . Carpal tunnel syndrome   . Carpal tunnel syndrome, bilateral   . Epistaxis   . Essential hypertension, benign    takes Metoprolol daily  . Exertional shortness of breath    "recently" (03/28/2013)  . H/O hiatal hernia   . History of atrial fibrillation   . History of kidney stones   . Hyperlipidemia    takes Lipitor daily  . Joint pain   . Nocturia   . Nonspecific abnormal unspecified cardiovascular function study   . OSA on CPAP    wears CPAP 02/03/16  . Other primary cardiomyopathies   . Pacemaker    MDT  . Paroxysmal ventricular tachycardia ([place])   . Peripheral edema    takes Lasix daily  . PONV (postoperative nausea and vomiting)   . Vitamin D deficiency    Past Surgical History:  Procedure Laterality Date  . CARDIAC CATHETERIZATION  03/21/2013  . CARPAL TUNNEL RELEASE Right 02/05/2016   Procedure: RIGHT LIMITED OPEN CARPAL TUNNEL RELEASE;  Surgeon: Roseanne Kaufman, MD;   Location: [place];  Service: Orthopedics;  Laterality: Right;  . CARPAL TUNNEL RELEASE Left 04/21/2016   Procedure: CARPAL TUNNEL RELEASE;  Surgeon: Roseanne Kaufman, MD;  Location: [facility];  Service: Orthopedics;  Laterality: Left;  . CATARACT EXTRACTION W/ INTRAOCULAR LENS  IMPLANT, BILATERAL Bilateral 2000's  . COLONOSCOPY    . CORONARY ANGIOPLASTY WITH STENT PLACEMENT  03/28/2013   "3 stents" (03/28/2013)  . INSERT / REPLACE / [place] Left 1972   "cut it open" (03/28/2013)  . KNEE CARTILAGE SURGERY Right ~ 1977   "cut it open" (03/28/2013)  . 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 PERCUTANEOUS CORONARY ROTOBLATOR INTERVENTION (PCI-R) N/A 03/28/2013   Procedure: PERCUTANEOUS CORONARY ROTOBLATOR INTERVENTION (PCI-R);  Surgeon: Jettie Booze, MD;  Location: [facility] CATH LAB;  Service: Cardiovascular;  Laterality: N/A;  . SEPTOPLASTY N/A 02/05/2016   Procedure: SEPTOPLASTY WITH INTRANASAL SKIN GRAFT;  Surgeon: Rozetta Nunnery, MD;  Location: [place];  Service: ENT;  Laterality: N/A;  . SHOULDER HEMI-ARTHROPLASTY Right 1987   "got hit by sled & dragged down sheet > 200 feet; tore shoulder up" (03/28/2013)  . SHOULDER SURGERY Right 1970's   "pulled muscle apart; sewed it back together" (03/28/2013  . TOTAL KNEE ARTHROPLASTY Right 2007  . TOTAL KNEE ARTHROPLASTY  11/26/2012  Procedure: TOTAL KNEE ARTHROPLASTY;  Surgeon: Gearlean Alf, MD;  Location: [facility];  Service: Orthopedics;  Laterality: Left;  . TRANSURETHRAL RESECTION OF PROSTATE N/A 12/27/2017   Procedure: TRANSURETHRAL RESECTION OF THE PROSTATE (TURP);  Surgeon: Lucas Mallow, MD;  [facility];  Service: Urology;  Laterality: N/A;    Current Outpatient Medications  Medication Sig Dispense Refill  . acetaminophen (TYLENOL) 500 MG tablet Take 1,000 mg by mouth at bedtime as needed for mild pain. For leg pain.     Marland Kitchen atorvastatin (LIPITOR) 10 MG tablet Take 1 tablet (10 mg total) by mouth daily at 6 PM. 90 tablet 3  . Cholecalciferol (VITAMIN D3) 5000 units CAPS Take 1 capsule by mouth daily.    . furosemide (LASIX) 40 MG tablet TAKE 1 TABLET (40 MG TOTAL) BY MOUTH DAILY. 90 tablet 3  . Glucosamine-Chondroitin (MOVE FREE PO) Take 1 tablet by mouth daily.     Marland Kitchen HYDROcodone-acetaminophen (NORCO) 5-325 MG tablet Take 1 tablet by mouth every 4 (four) hours as needed for moderate pain. 10 tablet 0  . metoprolol tartrate (LOPRESSOR) 25 MG tablet Take 1 tablet (25 mg total) by mouth 2 (two) times daily. 60 tablet 11  . Multiple Vitamins-Minerals (PRESERVISION AREDS 2 PO) Take 2 capsules by mouth daily. 2 tabs daily     . nitroGLYCERIN (NITROSTAT) 0.4 MG SL tablet Place 1 tablet (0.4 mg total) under the tongue every 5 (five) minutes as needed for chest pain. 25 tablet 5  . Omega-3 Fatty Acids (FISH OIL PO) Take 1 tablet by mouth daily.    . rivaroxaban (XARELTO) 20 MG TABS tablet Take 20 mg by mouth daily with supper.     No current facility-administered medications for this visit.     Allergies:   Penicillins and Hydromorphone hcl   Social History: Social History   Socioeconomic History  . Marital status: Married    Spouse name: Not on file  . Number of children: Not on file  . Years of education: Not on file  . Highest education level: Not on file  Social Needs  . Financial resource strain: Not on file  . Food insecurity - worry: Not on file  . Food insecurity - inability: Not on file  . Transportation needs - medical: Not on file  . Transportation needs - non-medical: Not on file  Occupational History  . Occupation: retired    Fish farm manager: [employer]  Tobacco Use  . Smoking status: Former Smoker    Packs/day: 1.00    Years: 26.00    Pack years: 26.00    Types: Cigarettes    Last attempt to quit: 10/31/1973    Years since quitting: 44.2  . Smokeless tobacco: Never Used  Substance and  Sexual Activity  . Alcohol use: No  . Drug use: No  . Sexual activity: No  Other Topics Concern  . Not on file  Social History Narrative  . Not on file    Family History: Family History  Problem Relation Age of Onset  . CAD Brother 74  . Stroke Brother   . Cancer Unknown   . Heart disease Unknown   . Heart attack Neg Hx   . Hypertension Neg Hx      Review of Systems: All other systems reviewed and are otherwise negative except as noted above.   Physical Exam: VS:  BP 118/70   Pulse 82   Ht 5' 10"  (1.778 m)   Wt 200  lb (90.7 kg)   BMI 28.70 kg/m  , BMI Body mass index is 28.7 kg/m.  GEN- The patient is elderly appearing, alert and oriented x 3 today.   HEENT: normocephalic, atraumatic; sclera clear, conjunctiva pink; hearing intact; oropharynx clear; neck supple  Lungs- Clear to ausculation bilaterally, normal work of breathing.  No wheezes, rales, rhonchi Heart- Irregular rate and rhythm  GI- soft, non-tender, non-distended, bowel sounds present  Extremities- no clubbing, cyanosis, or edema  MS- no significant deformity or atrophy Skin- warm and dry, no rash or lesion; PPM pocket well healed Psych- euthymic mood, full affect Neuro- strength and sensation are intact  PPM Interrogation- reviewed in detail today,  See PACEART report  EKG:  EKG is not ordered today.  Recent Labs: 12/26/2017: ALT 14; BUN 13; Creatinine, Ser 0.99; Platelets 158; Potassium 4.1; Sodium 142 12/28/2017: Hemoglobin 12.2   Wt Readings from Last 3 Encounters:  01/05/18 200 lb (90.7 kg)  12/27/17 198 lb 4.8 oz (89.9 kg)  11/17/17 199 lb 6.4 oz (90.4 kg)     Other studies Reviewed: Additional studies/ records that were reviewed today include: Dr Irish Lack and Dr Jackalyn Lombard office notes   Assessment and Plan:  1.  Mobitz II Normal PPM function See Pace Art report No changes today  2.  Paroxysmal atrial fibrillation Burden by device interrogation <0.1% Continue Xarelto for CHADS2VASC  of 5 Labs recently stable   3.  HTN Stable No change required today  4.  ICM/CAD Stable No change required today  5.  NSVT/PVC's Burden by device interrogation at least 10%, he is in bigeminy today with NSVT Asymptomatic Increase Metoprolol to 29m qam and 562mqpm Will follow burden remotely     Current medicines are reviewed at length with the patient today.   The patient does not have concerns regarding his medicines.  The following changes were made today:  none  Labs/ tests ordered today include: none Orders Placed This Encounter  Procedures  . CUP PACEART INCLINIC DEVICE CHECK     Disposition:   Follow up with [facility], me every year, Dr VaIrish Lacks scheduled     Signed, AmChanetta MarshallNP 01/05/2018 10:28 AM  [address] [address] SevierC [phone]) ([phone])

## 2018-01-05 ENCOUNTER — Ambulatory Visit (INDEPENDENT_AMBULATORY_CARE_PROVIDER_SITE_OTHER): Payer: Medicare Other | Admitting: Nurse Practitioner

## 2018-01-05 ENCOUNTER — Encounter: Payer: Self-pay | Admitting: Nurse Practitioner

## 2018-01-05 VITALS — BP 118/70 | HR 82 | Ht 70.0 in | Wt 200.0 lb

## 2018-01-05 DIAGNOSIS — I480 Paroxysmal atrial fibrillation: Secondary | ICD-10-CM

## 2018-01-05 DIAGNOSIS — I441 Atrioventricular block, second degree: Secondary | ICD-10-CM | POA: Diagnosis not present

## 2018-01-05 DIAGNOSIS — I2510 Atherosclerotic heart disease of native coronary artery without angina pectoris: Secondary | ICD-10-CM | POA: Diagnosis not present

## 2018-01-05 DIAGNOSIS — I10 Essential (primary) hypertension: Secondary | ICD-10-CM

## 2018-01-05 LAB — CUP PACEART INCLINIC DEVICE CHECK
Implantable Lead Implant Date: 20111206
Implantable Lead Location: 753859
Implantable Lead Model: 5076
Implantable Lead Model: 5092
Implantable Pulse Generator Implant Date: 20111206
MDC IDC LEAD IMPLANT DT: 20111206
MDC IDC LEAD LOCATION: 753860
MDC IDC SESS DTM: 20190308101335

## 2018-01-05 MED ORDER — METOPROLOL TARTRATE 25 MG PO TABS: ORAL_TABLET | ORAL | 11 refills | Status: DC

## 2018-01-05 NOTE — Patient Instructions (Addendum)
Medication Instructions:   START TAKING METOPROLOL 25 MG   IN THE AM AND 50 MG IN THE PM   If you need a refill on your cardiac medications before your next appointment, please call your pharmacy.  Labwork: NONE ORDERED  TODAY    Testing/Procedures: NONE ORDERED  TODAY    Follow-Up:  Your physician wants you to follow-up in: ONE YEAR WITH   SEILER  You will receive a reminder letter in the mail two months in advance. If you don't receive a letter, please call our office to schedule the follow-up appointment.  Remote monitoring is used to monitor your Pacemaker of ICD from home. This monitoring reduces the number of office visits required to check your device to one time per year. It allows [place] to keep an eye on the functioning of your device to ensure it is working properly. You are scheduled for a device check from home on . 01-31-18  You may send your transmission at any time that day. If you have a wireless device, the transmission will be sent automatically. After your physician reviews your transmission, you will receive a postcard with your next transmission date.     Any Other Special Instructions Will Be Listed Below (If Applicable).

## 2018-01-05 NOTE — Addendum Note (Signed)
Addended by: Oleta Mouse on: 01/05/2018 11:05 AM   Modules accepted: Orders

## 2018-01-31 ENCOUNTER — Telehealth: Payer: Self-pay | Admitting: Cardiology

## 2018-01-31 ENCOUNTER — Ambulatory Visit (INDEPENDENT_AMBULATORY_CARE_PROVIDER_SITE_OTHER): Payer: Medicare Other | Admitting: *Deleted

## 2018-01-31 DIAGNOSIS — I441 Atrioventricular block, second degree: Secondary | ICD-10-CM | POA: Diagnosis not present

## 2018-01-31 NOTE — Telephone Encounter (Signed)
Spoke with pt and reminded pt of remote transmission that is due today. Pt verbalized understanding.   

## 2018-02-01 ENCOUNTER — Encounter: Payer: Self-pay | Admitting: Cardiology

## 2018-02-01 NOTE — Progress Notes (Signed)
Remote pacemaker transmission.   

## 2018-02-12 LAB — CUP PACEART REMOTE DEVICE CHECK
Brady Statistic AP VS Percent: 49 %
Brady Statistic AS VP Percent: 8 %
Brady Statistic AS VS Percent: 31 %
Date Time Interrogation Session: 20190404001951
Implantable Lead Implant Date: 20111206
Implantable Lead Implant Date: 20111206
Implantable Lead Location: 753859
Implantable Lead Location: 753860
Lead Channel Impedance Value: 719 Ohm
Lead Channel Pacing Threshold Amplitude: 0.75 V
Lead Channel Pacing Threshold Pulse Width: 0.4 ms
Lead Channel Pacing Threshold Pulse Width: 0.4 ms
Lead Channel Setting Pacing Amplitude: 2.5 V
Lead Channel Setting Pacing Pulse Width: 0.4 ms
Lead Channel Setting Sensing Sensitivity: 5.6 mV
MDC IDC MSMT BATTERY IMPEDANCE: 620 Ohm
MDC IDC MSMT BATTERY REMAINING LONGEVITY: 83 mo
MDC IDC MSMT BATTERY VOLTAGE: 2.77 V
MDC IDC MSMT LEADCHNL RA IMPEDANCE VALUE: 441 Ohm
MDC IDC MSMT LEADCHNL RA PACING THRESHOLD AMPLITUDE: 1 V
MDC IDC PG IMPLANT DT: 20111206
MDC IDC SET LEADCHNL RA PACING AMPLITUDE: 2 V
MDC IDC STAT BRADY AP VP PERCENT: 12 %

## 2018-02-27 DIAGNOSIS — M47812 Spondylosis without myelopathy or radiculopathy, cervical region: Secondary | ICD-10-CM | POA: Insufficient documentation

## 2018-02-27 DIAGNOSIS — M542 Cervicalgia: Secondary | ICD-10-CM | POA: Insufficient documentation

## 2018-03-15 ENCOUNTER — Other Ambulatory Visit: Payer: Self-pay | Admitting: Internal Medicine

## 2018-03-15 NOTE — Telephone Encounter (Signed)
Xarelto 20mg  refill request received, pt is 84 yrs old, wt-90.7kg, Crea-0.99 on 12/26/17, last seen by Gypsy Balsam on 01/05/18, CrCl-67.28ml/min. Will send in refill to requested pharmacy.

## 2018-05-02 ENCOUNTER — Telehealth: Payer: Self-pay | Admitting: Cardiology

## 2018-05-02 ENCOUNTER — Ambulatory Visit (INDEPENDENT_AMBULATORY_CARE_PROVIDER_SITE_OTHER): Payer: Medicare Other | Admitting: *Deleted

## 2018-05-02 DIAGNOSIS — I441 Atrioventricular block, second degree: Secondary | ICD-10-CM

## 2018-05-02 NOTE — Telephone Encounter (Signed)
Spoke with pt and reminded pt of remote transmission that is due today. Pt verbalized understanding.   

## 2018-05-04 ENCOUNTER — Encounter: Payer: Self-pay | Admitting: Cardiology

## 2018-05-04 NOTE — Progress Notes (Signed)
Remote pacemaker transmission.   

## 2018-05-24 LAB — CUP PACEART REMOTE DEVICE CHECK
Battery Impedance: 770 Ohm
Battery Voltage: 2.77 V
Brady Statistic AP VP Percent: 10 %
Brady Statistic AP VS Percent: 49 %
Brady Statistic AS VS Percent: 31 %
Implantable Lead Implant Date: 20111206
Implantable Lead Location: 753860
Implantable Lead Model: 5076
Implantable Lead Model: 5092
Lead Channel Impedance Value: 455 Ohm
Lead Channel Impedance Value: 776 Ohm
Lead Channel Pacing Threshold Amplitude: 0.75 V
Lead Channel Pacing Threshold Pulse Width: 0.4 ms
Lead Channel Sensing Intrinsic Amplitude: 11.2 mV
Lead Channel Sensing Intrinsic Amplitude: 2.8 mV
Lead Channel Setting Pacing Amplitude: 2 V
Lead Channel Setting Pacing Pulse Width: 0.4 ms
MDC IDC LEAD IMPLANT DT: 20111206
MDC IDC LEAD LOCATION: 753859
MDC IDC MSMT BATTERY REMAINING LONGEVITY: 74 mo
MDC IDC MSMT LEADCHNL RA PACING THRESHOLD PULSEWIDTH: 0.4 ms
MDC IDC MSMT LEADCHNL RV PACING THRESHOLD AMPLITUDE: 0.625 V
MDC IDC PG IMPLANT DT: 20111206
MDC IDC SESS DTM: 20190703200932
MDC IDC SET LEADCHNL RV PACING AMPLITUDE: 2.5 V
MDC IDC SET LEADCHNL RV SENSING SENSITIVITY: 5.6 mV
MDC IDC STAT BRADY AS VP PERCENT: 9 %

## 2018-08-02 ENCOUNTER — Telehealth: Payer: Self-pay | Admitting: Cardiology

## 2018-08-02 ENCOUNTER — Ambulatory Visit (INDEPENDENT_AMBULATORY_CARE_PROVIDER_SITE_OTHER): Payer: Medicare Other | Admitting: *Deleted

## 2018-08-02 DIAGNOSIS — I4430 Unspecified atrioventricular block: Secondary | ICD-10-CM | POA: Diagnosis not present

## 2018-08-02 DIAGNOSIS — I441 Atrioventricular block, second degree: Secondary | ICD-10-CM

## 2018-08-02 LAB — CUP PACEART REMOTE DEVICE CHECK
Battery Impedance: 770 Ohm
Battery Remaining Longevity: 73 mo
Battery Voltage: 2.77 V
Brady Statistic AS VP Percent: 10 %
Implantable Lead Implant Date: 20111206
Implantable Lead Location: 753860
Implantable Lead Model: 5076
Implantable Lead Model: 5092
Implantable Pulse Generator Implant Date: 20111206
Lead Channel Impedance Value: 455 Ohm
Lead Channel Impedance Value: 774 Ohm
Lead Channel Pacing Threshold Amplitude: 0.75 V
Lead Channel Sensing Intrinsic Amplitude: 11.2 mV
Lead Channel Sensing Intrinsic Amplitude: 2.8 mV
Lead Channel Setting Pacing Amplitude: 2.25 V
Lead Channel Setting Pacing Pulse Width: 0.4 ms
Lead Channel Setting Sensing Sensitivity: 5.6 mV
MDC IDC LEAD IMPLANT DT: 20111206
MDC IDC LEAD LOCATION: 753859
MDC IDC MSMT LEADCHNL RA PACING THRESHOLD AMPLITUDE: 1.125 V
MDC IDC MSMT LEADCHNL RA PACING THRESHOLD PULSEWIDTH: 0.4 ms
MDC IDC MSMT LEADCHNL RV PACING THRESHOLD PULSEWIDTH: 0.4 ms
MDC IDC SESS DTM: 20191003153652
MDC IDC SET LEADCHNL RV PACING AMPLITUDE: 2.5 V
MDC IDC STAT BRADY AP VP PERCENT: 12 %
MDC IDC STAT BRADY AP VS PERCENT: 47 %
MDC IDC STAT BRADY AS VS PERCENT: 31 %

## 2018-08-02 NOTE — Telephone Encounter (Signed)
Spoke with pt and reminded pt of remote transmission that is due today. Pt verbalized understanding.   

## 2018-08-03 NOTE — Progress Notes (Signed)
Remote pacemaker transmission.   

## 2018-09-27 ENCOUNTER — Observation Stay (HOSPITAL_COMMUNITY)
Admission: EM | Admit: 2018-09-27 | Discharge: 2018-09-28 | Disposition: A | Discharge status: Home / Self Care | Payer: Medicare Other | Attending: Family Medicine | Admitting: Family Medicine

## 2018-09-27 ENCOUNTER — Encounter (HOSPITAL_COMMUNITY): Payer: Self-pay | Admitting: Emergency Medicine

## 2018-09-27 ENCOUNTER — Emergency Department (HOSPITAL_COMMUNITY): Payer: Medicare Other

## 2018-09-27 ENCOUNTER — Observation Stay (HOSPITAL_COMMUNITY): Payer: Medicare Other

## 2018-09-27 ENCOUNTER — Other Ambulatory Visit: Payer: Self-pay

## 2018-09-27 DIAGNOSIS — J90 Pleural effusion, not elsewhere classified: Secondary | ICD-10-CM | POA: Insufficient documentation

## 2018-09-27 DIAGNOSIS — G4733 Obstructive sleep apnea (adult) (pediatric): Secondary | ICD-10-CM | POA: Insufficient documentation

## 2018-09-27 DIAGNOSIS — E559 Vitamin D deficiency, unspecified: Secondary | ICD-10-CM | POA: Insufficient documentation

## 2018-09-27 DIAGNOSIS — E785 Hyperlipidemia, unspecified: Secondary | ICD-10-CM | POA: Insufficient documentation

## 2018-09-27 DIAGNOSIS — I083 Combined rheumatic disorders of mitral, aortic and tricuspid valves: Secondary | ICD-10-CM | POA: Insufficient documentation

## 2018-09-27 DIAGNOSIS — I10 Essential (primary) hypertension: Secondary | ICD-10-CM

## 2018-09-27 DIAGNOSIS — N401 Enlarged prostate with lower urinary tract symptoms: Secondary | ICD-10-CM | POA: Insufficient documentation

## 2018-09-27 DIAGNOSIS — Z79899 Other long term (current) drug therapy: Secondary | ICD-10-CM | POA: Insufficient documentation

## 2018-09-27 DIAGNOSIS — I429 Cardiomyopathy, unspecified: Secondary | ICD-10-CM | POA: Diagnosis not present

## 2018-09-27 DIAGNOSIS — I420 Dilated cardiomyopathy: Secondary | ICD-10-CM | POA: Diagnosis not present

## 2018-09-27 DIAGNOSIS — Z8249 Family history of ischemic heart disease and other diseases of the circulatory system: Secondary | ICD-10-CM | POA: Insufficient documentation

## 2018-09-27 DIAGNOSIS — Z9889 Other specified postprocedural states: Secondary | ICD-10-CM | POA: Insufficient documentation

## 2018-09-27 DIAGNOSIS — I130 Hypertensive heart and chronic kidney disease with heart failure and stage 1 through stage 4 chronic kidney disease, or unspecified chronic kidney disease: Secondary | ICD-10-CM | POA: Insufficient documentation

## 2018-09-27 DIAGNOSIS — Z96611 Presence of right artificial shoulder joint: Secondary | ICD-10-CM | POA: Insufficient documentation

## 2018-09-27 DIAGNOSIS — Z87442 Personal history of urinary calculi: Secondary | ICD-10-CM | POA: Insufficient documentation

## 2018-09-27 DIAGNOSIS — R079 Chest pain, unspecified: Secondary | ICD-10-CM | POA: Diagnosis not present

## 2018-09-27 DIAGNOSIS — I700 Atherosclerosis of aorta: Secondary | ICD-10-CM | POA: Insufficient documentation

## 2018-09-27 DIAGNOSIS — Z87891 Personal history of nicotine dependence: Secondary | ICD-10-CM | POA: Insufficient documentation

## 2018-09-27 DIAGNOSIS — K449 Diaphragmatic hernia without obstruction or gangrene: Secondary | ICD-10-CM | POA: Diagnosis not present

## 2018-09-27 DIAGNOSIS — J439 Emphysema, unspecified: Secondary | ICD-10-CM | POA: Insufficient documentation

## 2018-09-27 DIAGNOSIS — I5032 Chronic diastolic (congestive) heart failure: Secondary | ICD-10-CM | POA: Insufficient documentation

## 2018-09-27 DIAGNOSIS — I2510 Atherosclerotic heart disease of native coronary artery without angina pectoris: Secondary | ICD-10-CM | POA: Insufficient documentation

## 2018-09-27 DIAGNOSIS — R0789 Other chest pain: Secondary | ICD-10-CM | POA: Diagnosis present

## 2018-09-27 DIAGNOSIS — Z9841 Cataract extraction status, right eye: Secondary | ICD-10-CM | POA: Insufficient documentation

## 2018-09-27 DIAGNOSIS — M1990 Unspecified osteoarthritis, unspecified site: Secondary | ICD-10-CM | POA: Insufficient documentation

## 2018-09-27 DIAGNOSIS — I4891 Unspecified atrial fibrillation: Secondary | ICD-10-CM | POA: Diagnosis not present

## 2018-09-27 DIAGNOSIS — Z9989 Dependence on other enabling machines and devices: Secondary | ICD-10-CM

## 2018-09-27 DIAGNOSIS — I441 Atrioventricular block, second degree: Secondary | ICD-10-CM | POA: Diagnosis not present

## 2018-09-27 DIAGNOSIS — Z955 Presence of coronary angioplasty implant and graft: Secondary | ICD-10-CM | POA: Insufficient documentation

## 2018-09-27 DIAGNOSIS — N179 Acute kidney failure, unspecified: Secondary | ICD-10-CM | POA: Insufficient documentation

## 2018-09-27 DIAGNOSIS — Z823 Family history of stroke: Secondary | ICD-10-CM | POA: Insufficient documentation

## 2018-09-27 DIAGNOSIS — Z9842 Cataract extraction status, left eye: Secondary | ICD-10-CM | POA: Insufficient documentation

## 2018-09-27 DIAGNOSIS — Z950 Presence of cardiac pacemaker: Secondary | ICD-10-CM | POA: Insufficient documentation

## 2018-09-27 DIAGNOSIS — Z96653 Presence of artificial knee joint, bilateral: Secondary | ICD-10-CM | POA: Insufficient documentation

## 2018-09-27 DIAGNOSIS — N182 Chronic kidney disease, stage 2 (mild): Secondary | ICD-10-CM | POA: Insufficient documentation

## 2018-09-27 DIAGNOSIS — Z880 Allergy status to penicillin: Secondary | ICD-10-CM | POA: Insufficient documentation

## 2018-09-27 DIAGNOSIS — Z7901 Long term (current) use of anticoagulants: Secondary | ICD-10-CM | POA: Insufficient documentation

## 2018-09-27 DIAGNOSIS — Z885 Allergy status to narcotic agent status: Secondary | ICD-10-CM | POA: Insufficient documentation

## 2018-09-27 LAB — CBC
HCT: 38.9 % — ABNORMAL LOW (ref 39.0–52.0)
HEMOGLOBIN: 12.3 g/dL — AB (ref 13.0–17.0)
MCH: 30.4 pg (ref 26.0–34.0)
MCHC: 31.6 g/dL (ref 30.0–36.0)
MCV: 96 fL (ref 80.0–100.0)
Platelets: 183 10*3/uL (ref 150–400)
RBC: 4.05 MIL/uL — ABNORMAL LOW (ref 4.22–5.81)
RDW: 14.4 % (ref 11.5–15.5)
WBC: 8.8 10*3/uL (ref 4.0–10.5)
nRBC: 0 % (ref 0.0–0.2)

## 2018-09-27 LAB — BASIC METABOLIC PANEL
Anion gap: 11 (ref 5–15)
BUN: 19 mg/dL (ref 8–23)
CO2: 24 mmol/L (ref 22–32)
Calcium: 8.8 mg/dL — ABNORMAL LOW (ref 8.9–10.3)
Chloride: 101 mmol/L (ref 98–111)
Creatinine, Ser: 1.36 mg/dL — ABNORMAL HIGH (ref 0.61–1.24)
GFR calc Af Amer: 54 mL/min — ABNORMAL LOW (ref >60)
GFR, EST NON AFRICAN AMERICAN: 46 mL/min — AB (ref >60)
Glucose, Bld: 118 mg/dL — ABNORMAL HIGH (ref 70–99)
POTASSIUM: 4.9 mmol/L (ref 3.5–5.1)
Sodium: 136 mmol/L (ref 135–145)

## 2018-09-27 LAB — PROTIME-INR
INR: 1.94
Prothrombin Time: 21.9 seconds — ABNORMAL HIGH (ref 11.4–15.2)

## 2018-09-27 LAB — BRAIN NATRIURETIC PEPTIDE: B Natriuretic Peptide: 539.4 pg/mL — ABNORMAL HIGH (ref 0.0–100.0)

## 2018-09-27 LAB — I-STAT TROPONIN, ED: Troponin i, poc: 0 ng/mL (ref 0.00–0.08)

## 2018-09-27 LAB — TROPONIN I: Troponin I: 0.03 ng/mL (ref <0.03)

## 2018-09-27 LAB — D-DIMER, QUANTITATIVE: D-Dimer, Quant: 0.76 ug/mL-FEU — ABNORMAL HIGH (ref 0.00–0.50)

## 2018-09-27 MED ORDER — OXYCODONE-ACETAMINOPHEN 5-325 MG PO TABS: 1.0000 | ORAL_TABLET | ORAL | Status: DC | PRN

## 2018-09-27 MED ORDER — RIVAROXABAN 20 MG PO TABS
20.0000 mg | ORAL_TABLET | Freq: Every day | ORAL | Status: DC
  Administered 2018-09-28: 20 mg via ORAL
  Filled 2018-09-27: qty 1

## 2018-09-27 MED ORDER — NITROGLYCERIN 0.4 MG SL SUBL: 0.4000 mg | SUBLINGUAL_TABLET | SUBLINGUAL | Status: DC | PRN

## 2018-09-27 MED ORDER — IOPAMIDOL (ISOVUE-370) INJECTION 76%
INTRAVENOUS | Status: AC
  Administered 2018-09-27: 85 mL
  Filled 2018-09-27: qty 100

## 2018-09-27 MED ORDER — ATORVASTATIN CALCIUM 10 MG PO TABS: 10.0000 mg | ORAL_TABLET | Freq: Every day | ORAL | Status: DC

## 2018-09-27 MED ORDER — PANTOPRAZOLE SODIUM 40 MG PO TBEC
40.0000 mg | DELAYED_RELEASE_TABLET | Freq: Every day | ORAL | Status: DC
  Administered 2018-09-28 (×2): 40 mg via ORAL
  Filled 2018-09-27 (×2): qty 1

## 2018-09-27 MED ORDER — METOPROLOL TARTRATE 25 MG PO TABS
25.0000 mg | ORAL_TABLET | Freq: Every day | ORAL | Status: DC
  Administered 2018-09-28: 25 mg via ORAL
  Filled 2018-09-27: qty 1

## 2018-09-27 MED ORDER — ACETAMINOPHEN 325 MG PO TABS: 650.0000 mg | ORAL_TABLET | ORAL | Status: DC | PRN

## 2018-09-27 MED ORDER — OMEGA-3-ACID ETHYL ESTERS 1 G PO CAPS
1.0000 g | ORAL_CAPSULE | Freq: Every day | ORAL | Status: DC
  Administered 2018-09-28: 1 g via ORAL
  Filled 2018-09-27: qty 1

## 2018-09-27 MED ORDER — ONDANSETRON HCL 4 MG/2ML IJ SOLN: 4.0000 mg | Freq: Four times a day (QID) | INTRAMUSCULAR | Status: DC | PRN

## 2018-09-27 MED ORDER — METOPROLOL TARTRATE 50 MG PO TABS
50.0000 mg | ORAL_TABLET | Freq: Every day | ORAL | Status: DC
  Administered 2018-09-28: 50 mg via ORAL
  Filled 2018-09-27: qty 1

## 2018-09-27 MED ORDER — RIVAROXABAN 20 MG PO TABS: 20.0000 mg | ORAL_TABLET | Freq: Every day | ORAL | Status: DC

## 2018-09-27 MED ORDER — OCUVITE-LUTEIN PO CAPS
ORAL_CAPSULE | Freq: Every day | ORAL | Status: DC
  Filled 2018-09-27: qty 1

## 2018-09-27 MED ORDER — ZOLPIDEM TARTRATE 5 MG PO TABS: 5.0000 mg | ORAL_TABLET | Freq: Every evening | ORAL | Status: DC | PRN

## 2018-09-27 MED ORDER — HYDRALAZINE HCL 20 MG/ML IJ SOLN: 5.0000 mg | INTRAMUSCULAR | Status: DC | PRN

## 2018-09-27 MED ORDER — ONDANSETRON HCL 4 MG/2ML IJ SOLN: 4.0000 mg | Freq: Once | INTRAMUSCULAR | Status: DC

## 2018-09-27 MED ORDER — MORPHINE SULFATE (PF) 4 MG/ML IV SOLN: 4.0000 mg | Freq: Once | INTRAVENOUS | Status: DC

## 2018-09-27 MED ORDER — HYDROCODONE-ACETAMINOPHEN 5-325 MG PO TABS: 1.0000 | ORAL_TABLET | ORAL | Status: DC | PRN

## 2018-09-27 NOTE — ED Notes (Addendum)
Pt placed on 2 [place] for sats 90% on RA.

## 2018-09-27 NOTE — ED Notes (Addendum)
Pt reports pain upon inspiration.

## 2018-09-27 NOTE — ED Provider Notes (Signed)
[facility] EMERGENCY DEPARTMENT Provider Note   CSN: [id number] Arrival date & time: 09/27/18  1641     History   Chief Complaint Chief Complaint  Patient presents with  . Chest Pain    HPI Gregory Mccormick is a 84 y.o. male.  Patient is an 84 year old male with history of atrial fibrillation, coronary artery disease with stents, SVT, cardiomyopathy.  He presents today for evaluation of chest discomfort.  He reports not feeling well starting this morning, then stood up and felt sharp pain in the front of his chest.  He describes sharp pain that is worse with deep breathing.  He does feel somewhat short of breath, but denies any cough, fevers, or chills.  The history is provided by the patient.  Chest Pain   This is a new problem. The current episode started 1 to 2 hours ago. The problem occurs constantly. The problem has not changed since onset.The pain is associated with breathing. The pain is present in the substernal region. The pain is moderate. The quality of the pain is described as sharp. The pain does not radiate. Associated symptoms include shortness of breath. Pertinent negatives include no diaphoresis, no dizziness, no fever, no nausea and no palpitations.    Past Medical History:  Diagnosis Date  . Anemia, unspecified   . Arthritis    "right knee" (03/28/2013  . CAD in native artery    takes Xarelto daily  . Cardiomyopathy ([facility])    EF 40%:  ECHO 11/25/2015 EF 55%-60%  . Carpal tunnel syndrome   . Carpal tunnel syndrome, bilateral   . Epistaxis   . Essential hypertension, benign    takes Metoprolol daily  . Exertional shortness of breath    "recently" (03/28/2013)  . H/O hiatal hernia   . History of atrial fibrillation   . History of kidney stones   . Hyperlipidemia    takes Lipitor daily  . Joint pain   . Nocturia   . Nonspecific abnormal unspecified cardiovascular function study   . OSA on CPAP    wears CPAP 02/03/16  . Other primary  cardiomyopathies   . Pacemaker    MDT  . Paroxysmal ventricular tachycardia ([facility])   . Peripheral edema    takes Lasix daily  . PONV (postoperative nausea and vomiting)   . Vitamin D deficiency     Patient Active Problem List   Diagnosis Date Noted  . BPH (benign prostatic hyperplasia) 12/27/2017  . Atrial fibrillation ([facility]) 01/25/2016  . Hyperlipidemia 10/06/2015  . Coronary atherosclerosis of native coronary artery 02/13/2014  . Chronic systolic dysfunction of left ventricle 12/05/2013  . Nonspecific abnormal unspecified cardiovascular function study   . Other primary cardiomyopathies   . Paroxysmal ventricular tachycardia ([facility])   . Postop Acute blood loss anemia 11/27/2012  . Postop Hyponatremia 11/27/2012  . OA (osteoarthritis) of knee 11/26/2012  . Mobitz type II atrioventricular block 01/24/2011  . ESSENTIAL HYPERTENSION, BENIGN 09/27/2010  . ATRIOVENTRICULAR BLOCK, 2ND DEGREE 09/27/2010  . SYNCOPE 09/27/2010    Past Surgical History:  Procedure Laterality Date  . CARDIAC CATHETERIZATION  03/21/2013  . CARPAL TUNNEL RELEASE Right 02/05/2016   Procedure: RIGHT LIMITED OPEN CARPAL TUNNEL RELEASE;  Surgeon: Dominica Severin, MD;  Location: [facility];  Service: Orthopedics;  Laterality: Right;  . CARPAL TUNNEL RELEASE Left 04/21/2016   Procedure: CARPAL TUNNEL RELEASE;  Surgeon: Dominica Severin, MD;  Location: [facility];  Service: Orthopedics;  Laterality: Left;  . CATARACT EXTRACTION W/ INTRAOCULAR  LENS  IMPLANT, BILATERAL Bilateral 2000's  . COLONOSCOPY    . CORONARY ANGIOPLASTY WITH STENT PLACEMENT  03/28/2013   "3 stents" (03/28/2013)  . INSERT / REPLACE / REMOVE PACEMAKER    . KNEE CARTILAGE SURGERY Left 1972   "cut it open" (03/28/2013)  . KNEE CARTILAGE SURGERY Right ~ 1977   "cut it open" (03/28/2013)  . PACEMAKER INSERTION  10/05/10   MDT Adapta L implanted by Dr [name] for mobitz II second degree AV block  . PERCUTANEOUS CORONARY ROTOBLATOR INTERVENTION (PCI-R) N/A 03/28/2013    Procedure: PERCUTANEOUS CORONARY ROTOBLATOR INTERVENTION (PCI-R);  Surgeon: Corky Crafts, MD;  Location: [facility] CATH LAB;  Service: Cardiovascular;  Laterality: N/A;  . SEPTOPLASTY N/A 02/05/2016   Procedure: SEPTOPLASTY WITH INTRANASAL SKIN GRAFT;  Surgeon: Drema Halon, MD;  Location: [facility] OR;  Service: ENT;  Laterality: N/A;  . SHOULDER HEMI-ARTHROPLASTY Right 1987   "got hit by sled & dragged down sheet > 200 feet; tore shoulder up" (03/28/2013)  . SHOULDER SURGERY Right 1970's   "pulled muscle apart; sewed it back together" (03/28/2013  . TOTAL KNEE ARTHROPLASTY Right 2007  . TOTAL KNEE ARTHROPLASTY  11/26/2012   Procedure: TOTAL KNEE ARTHROPLASTY;  Surgeon: Loanne Drilling, MD;  Location: [facility];  Service: Orthopedics;  Laterality: Left;  . TRANSURETHRAL RESECTION OF PROSTATE N/A 12/27/2017   Procedure: TRANSURETHRAL RESECTION OF THE PROSTATE (TURP);  Surgeon: Crista Elliot, MD;  Location: [facility];  Service: Urology;  Laterality: N/A;        Home Medications    Prior to Admission medications   Medication Sig Start Date End Date Taking? Authorizing Provider  acetaminophen (TYLENOL) 500 MG tablet Take 1,000 mg by mouth at bedtime as needed for mild pain. For leg pain.    [provider]  atorvastatin (LIPITOR) 10 MG tablet Take 1 tablet (10 mg total) by mouth daily at 6 PM. 11/17/17   Corky Crafts, MD  Cholecalciferol (VITAMIN D3) 5000 units CAPS Take 1 capsule by mouth daily.    [provider]  furosemide (LASIX) 40 MG tablet TAKE 1 TABLET (40 MG TOTAL) BY MOUTH DAILY. 11/17/17   Corky Crafts, MD  Glucosamine-Chondroitin (MOVE FREE PO) Take 1 tablet by mouth daily.     [provider]  HYDROcodone-acetaminophen (NORCO) 5-325 MG tablet Take 1 tablet by mouth every 4 (four) hours as needed for moderate pain. 12/27/17   Crista Elliot, MD  metoprolol tartrate (LOPRESSOR) 25 MG tablet Take 25 mg in the am and Take  50   mg in the pm 01/05/18   Gypsy Balsam K, NP  Multiple Vitamins-Minerals (PRESERVISION AREDS 2 PO) Take 2 capsules by mouth daily. 2 tabs daily     [provider]  nitroGLYCERIN (NITROSTAT) 0.4 MG SL tablet Place 1 tablet (0.4 mg total) under the tongue every 5 (five) minutes as needed for chest pain. 11/17/17 02/15/18  Corky Crafts, MD  Omega-3 Fatty Acids (FISH OIL PO) Take 1 tablet by mouth daily.    [provider]  rivaroxaban (XARELTO) 20 MG TABS tablet Take 20 mg by mouth daily with supper.    [provider]  XARELTO 20 MG TABS tablet TAKE 1 TABLET (20 MG TOTAL) BY MOUTH DAILY WITH SUPPER. 03/15/18   Hillis Range, MD    Family History Family History  Problem Relation Age of Onset  . CAD Brother 53  . Stroke Brother   . Cancer Unknown   .  Heart disease Unknown   . Heart attack Neg Hx   . Hypertension Neg Hx     Social History Social History   Tobacco Use  . Smoking status: Former Smoker    Packs/day: 1.00    Years: 26.00    Pack years: 26.00    Types: Cigarettes    Last attempt to quit: 10/31/1973    Years since quitting: 44.9  . Smokeless tobacco: Never Used  Substance Use Topics  . Alcohol use: No  . Drug use: No     Allergies   Penicillins and Hydromorphone hcl   Review of Systems Review of Systems  Constitutional: Negative for diaphoresis and fever.  Respiratory: Positive for shortness of breath.   Cardiovascular: Positive for chest pain. Negative for palpitations.  Gastrointestinal: Negative for nausea.  Neurological: Negative for dizziness.  All other systems reviewed and are negative.    Physical Exam Updated Vital Signs BP 125/69   Pulse 73   Temp 98.4 F (36.9 C) (Oral)   Resp (!) 25   Ht 5\' 8"  (1.727 m)   Wt 83.9 kg   SpO2 91%   BMI 28.13 kg/m   Physical Exam  Constitutional: He is oriented to person, place, and time. He appears well-developed and well-nourished. No distress.  HENT:  Head: Normocephalic  and atraumatic.  Mouth/Throat: Oropharynx is clear and moist.  Neck: Normal range of motion. Neck supple.  Cardiovascular: An irregularly irregular rhythm present. Bradycardia present. Exam reveals no friction rub.  No murmur heard. Pulmonary/Chest: Effort normal and breath sounds normal. No respiratory distress. He has no wheezes. He has no rales.  Abdominal: Soft. Bowel sounds are normal. He exhibits no distension. There is no tenderness.  Musculoskeletal: Normal range of motion. He exhibits no edema.       Right lower leg: Normal. He exhibits no tenderness and no edema.       Left lower leg: Normal. He exhibits no tenderness and no edema.  Neurological: He is alert and oriented to person, place, and time. Coordination normal.  Skin: Skin is warm and dry. He is not diaphoretic.  Nursing note and vitals reviewed.    ED Treatments / Results  Labs (all labs ordered are listed, but only abnormal results are displayed) Labs Reviewed  BASIC METABOLIC PANEL  CBC  PROTIME-INR  I-STAT TROPONIN, ED    EKG EKG Interpretation  Date/Time:  Thursday September 27 2018 16:46:16 EST Ventricular Rate:  86 PR Interval:    QRS Duration: 93 QT Interval:  397 QTC Calculation: 373 R Axis:   86 Text Interpretation:  Vpaced Rhythm Ventricular bigeminy Prolonged PR interval Probable left atrial enlargement Borderline right axis deviation LVH with secondary repolarization abnormality Confirmed by Geoffery Lyons ([id number]) on 09/27/2018 5:17:57 PM   Radiology No results found.  Procedures Procedures (including critical care time)  Medications Ordered in ED Medications - No data to display   Initial Impression / Assessment and Plan / ED Course  I have reviewed the triage vital signs and the nursing notes.  Pertinent labs & imaging results that were available during my care of the patient were reviewed by me and considered in my medical decision making (see chart for details).  Patient with  history of coronary artery disease with stents presenting with complaints of sharp pain in the front of his chest since earlier this afternoon.  It seems to be worse with inspiration, but he denies shortness of breath.  Work-up reveals negative troponin and unchanged EKG.  His chest x-ray is clear.  He has declined pain medication offered in the ER.  Due to the patient's advanced age and prior cardiac history, I feel as though observation is appropriate.  He will be admitted to the hospitalist service under the care of Dr. Clyde Lundborg for serial cardiac enzymes and observation.  Final Clinical Impressions(s) / ED Diagnoses   Final diagnoses:  None    ED Discharge Orders    None       Geoffery Lyons, MD 09/27/18 2001

## 2018-09-27 NOTE — ED Notes (Signed)
Pt given turkey sandwich and coke 

## 2018-09-27 NOTE — H&P (Addendum)
History and Physical    Gregory Mccormick ZOX:[id number] DOB: May 31, 1929 DOA: 09/27/2018  Referring MD/NP/PA:   PCP: Tally Joe, MD   Patient coming from:  The patient is coming from home.  At baseline, pt is independent for most of ADL.        Chief Complaint: Chest pain  HPI: Gregory Mccormick is a 84 y.o. 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  Patient has states that his chest pain started at about 3 PM.  It is located in the substernal area, constant, initially 8 out of 10 in severity, currently 1 out of 10 severity, sharp, pleuritic, aggravated with deep breath and movement.  He had mild shortness of breath earlier, which has resolved.  Currently patient does not have shortness of breath, cough, fever or chills.  No recent long distant traveling.  No tenderness in calf areas.  Patient is on Xarelto, last dose was yesterday.  Denies nausea, vomiting, diarrhea, abdominal pain, symptoms of UTI or unilateral weakness.  ED Course: pt was found to have negative troponin, INR 1.94, positive d-dimer 0.76, WBC 8.8, worsening renal function, temperature normal, no tachycardia, has tachypnea, oxygen satting 91 to 96% on room air.  Chest x-ray showed bilateral small pleural effusion, mild cardiomegaly.  Patient is placed on telemetry bed for observation.  Review of Systems:   General: no fevers, chills, no body weight gain, has fatigue HEENT: no blurry vision, hearing changes or sore throat Respiratory: no dyspnea, coughing, wheezing CV: has chest pain, no palpitations GI: no nausea, vomiting, abdominal pain, diarrhea, constipation GU: no dysuria, burning on urination, increased urinary frequency, hematuria  Ext: has mild leg edema Neuro: no unilateral weakness, numbness, or tingling, no vision  change or hearing loss Skin: no rash, no skin tear. MSK: No muscle spasm, no deformity, no limitation of range of movement in spin Heme: No easy bruising.  Travel history: No recent long distant travel.  Allergy:  Allergies  Allergen Reactions  . Penicillins Hives    Has patient had a PCN reaction causing immediate rash, facial/tongue/throat swelling, SOB or lightheadedness with hypotension: YES Has patient had a PCN reaction causing severe rash involving mucus membranes or skin necrosis: No Has patient had a PCN reaction that required hospitalization No Has patient had a PCN reaction occurring within the last 10 years: No If all of the above answers are "NO", then may proceed with Cephalosporin use.   Marland Kitchen Hydromorphone Hcl Nausea And Vomiting    Past Medical History:  Diagnosis Date  . Anemia, unspecified   . Arthritis    "right knee" (03/28/2013  . CAD in native artery    takes Xarelto daily  . Cardiomyopathy (HCC)    EF 40%:  ECHO 11/25/2015 EF 55%-60%  . Carpal tunnel syndrome   . Carpal tunnel syndrome, bilateral   . Epistaxis   . Essential hypertension, benign    takes Metoprolol daily  . Exertional shortness of breath    "recently" (03/28/2013)  . H/O hiatal hernia   . History of atrial fibrillation   . History of kidney stones   . Hyperlipidemia    takes Lipitor daily  . Joint pain   . Nocturia   . Nonspecific abnormal unspecified cardiovascular function study   . OSA on CPAP    wears CPAP 02/03/16  . Other  primary cardiomyopathies   . Pacemaker    MDT  . Paroxysmal ventricular tachycardia (HCC)   . Peripheral edema    takes Lasix daily  . PONV (postoperative nausea and vomiting)   . Vitamin D deficiency     Past Surgical History:  Procedure Laterality Date  . CARDIAC CATHETERIZATION  03/21/2013  . CARPAL TUNNEL RELEASE Right 02/05/2016   Procedure: RIGHT LIMITED OPEN CARPAL TUNNEL RELEASE;  Surgeon: Dominica Severin, MD;  Location: [facility] OR;  Service: Orthopedics;   Laterality: Right;  . CARPAL TUNNEL RELEASE Left 04/21/2016   Procedure: CARPAL TUNNEL RELEASE;  Surgeon: Dominica Severin, MD;  Location: [facility] OR;  Service: Orthopedics;  Laterality: Left;  . CATARACT EXTRACTION W/ INTRAOCULAR LENS  IMPLANT, BILATERAL Bilateral 2000's  . COLONOSCOPY    . CORONARY ANGIOPLASTY WITH STENT PLACEMENT  03/28/2013   "3 stents" (03/28/2013)  . INSERT / REPLACE / REMOVE PACEMAKER    . KNEE CARTILAGE SURGERY Left 1972   "cut it open" (03/28/2013)  . KNEE CARTILAGE SURGERY Right ~ 1977   "cut it open" (03/28/2013)  . PACEMAKER INSERTION  10/05/10   MDT Adapta L implanted by Dr [name] for mobitz II second degree AV block  . PERCUTANEOUS CORONARY ROTOBLATOR INTERVENTION (PCI-R) N/A 03/28/2013   Procedure: PERCUTANEOUS CORONARY ROTOBLATOR INTERVENTION (PCI-R);  Surgeon: Corky Crafts, MD;  Location: [facility] CATH LAB;  Service: Cardiovascular;  Laterality: N/A;  . SEPTOPLASTY N/A 02/05/2016   Procedure: SEPTOPLASTY WITH INTRANASAL SKIN GRAFT;  Surgeon: Drema Halon, MD;  Location: [facility] OR;  Service: ENT;  Laterality: N/A;  . SHOULDER HEMI-ARTHROPLASTY Right 1987   "got hit by sled & dragged down sheet > 200 feet; tore shoulder up" (03/28/2013)  . SHOULDER SURGERY Right 1970's   "pulled muscle apart; sewed it back together" (03/28/2013  . TOTAL KNEE ARTHROPLASTY Right 2007  . TOTAL KNEE ARTHROPLASTY  11/26/2012   Procedure: TOTAL KNEE ARTHROPLASTY;  Surgeon: Loanne Drilling, MD;  Location: [facility] ORS;  Service: Orthopedics;  Laterality: Left;  . TRANSURETHRAL RESECTION OF PROSTATE N/A 12/27/2017   Procedure: TRANSURETHRAL RESECTION OF THE PROSTATE (TURP);  Surgeon: Crista Elliot, MD;  Location: [facility];  Service: Urology;  Laterality: N/A;    Social History:  reports that he quit smoking about 44 years ago. His smoking use included cigarettes. He has a 26.00 pack-year smoking history. He has never used smokeless tobacco. He reports that he does not drink  alcohol or use drugs.  Family History:  Family History  Problem Relation Age of Onset  . CAD Brother 58  . Stroke Brother   . Cancer Unknown   . Heart disease Unknown   . Heart attack Neg Hx   . Hypertension Neg Hx      Prior to Admission medications   Medication Sig Start Date End Date Taking? Authorizing Provider  acetaminophen (TYLENOL) 500 MG tablet Take 1,000 mg by mouth at bedtime as needed for mild pain. For leg pain.   Yes [provider]  atorvastatin (LIPITOR) 10 MG tablet Take 1 tablet (10 mg total) by mouth daily at 6 PM. 11/17/17  Yes Corky Crafts, MD  furosemide (LASIX) 40 MG tablet TAKE 1 TABLET (40 MG TOTAL) BY MOUTH DAILY. Patient taking differently: Take 40 mg by mouth daily.  11/17/17  Yes Corky Crafts, MD  Glucosamine-Chondroitin (MOVE FREE PO) Take 1 tablet by mouth daily.    Yes [provider]  metoprolol tartrate (LOPRESSOR) 25 MG tablet  Take 25 mg in the am and Take  50  mg in the pm Patient taking differently: Take 25-50 mg by mouth See admin instructions. Take 25 mg in the am and Take  50  mg in the pm 01/05/18  Yes Seiler, [facility] K, NP  Multiple Vitamins-Minerals (PRESERVISION AREDS 2 PO) Take 2 capsules by mouth daily. 2 tabs daily    Yes [provider]  nitroGLYCERIN (NITROSTAT) 0.4 MG SL tablet Place 1 tablet (0.4 mg total) under the tongue every 5 (five) minutes as needed for chest pain. 11/17/17 09/27/18 Yes Corky Crafts, MD  Omega-3 Fatty Acids (FISH OIL PO) Take 1 tablet by mouth daily.   Yes [provider]  XARELTO 20 MG TABS tablet TAKE 1 TABLET (20 MG TOTAL) BY MOUTH DAILY WITH SUPPER. Patient taking differently: Take 20 mg by mouth daily with supper.  03/15/18  Yes Allred, Fayrene Fearing, MD  HYDROcodone-acetaminophen (NORCO) 5-325 MG tablet Take 1 tablet by mouth every 4 (four) hours as needed for moderate pain. Patient not taking: Reported on 09/27/2018 12/27/17   Crista Elliot, MD    Physical  Exam: Vitals:   09/27/18 1900 09/27/18 1915 09/27/18 1930 09/27/18 2000  BP: 134/79 (!) 134/57 139/60 (!) 139/95  Pulse:  (!) 34 (!) 34 (!) 35  Resp:  (!) 26 (!) 26 (!) 22  Temp:      TempSrc:      SpO2: 96% 94% 95% 91%  Weight:      Height:       General: Not in acute distress HEENT:       Eyes: PERRL, EOMI, no scleral icterus.       ENT: No discharge from the ears and nose, no pharynx injection, no tonsillar enlargement.        Neck: No JVD, no bruit, no mass felt. Heme: No neck lymph node enlargement. Cardiac: S1/S2, RRR, 1/6 systolic murmurs, No gallops or rubs. Respiratory: No rales, wheezing, rhonchi or rubs. GI: Soft, nondistended, nontender, no rebound pain, no organomegaly, BS present. GU: No hematuria Ext: has trace leg edema bilaterally. 2+DP/PT pulse bilaterally. Musculoskeletal: No joint deformities, No joint redness or warmth, no limitation of ROM in spin. Skin: No rashes.  Neuro: Alert, oriented X3, cranial nerves II-XII grossly intact, moves all extremities normally.  Psych: Patient is not psychotic, no suicidal or hemocidal ideation.  Labs on Admission: I have personally reviewed following labs and imaging studies  CBC: Recent Labs  Lab 09/27/18 1700  WBC 8.8  HGB 12.3*  HCT 38.9*  MCV 96.0  PLT 183   Basic Metabolic Panel: Recent Labs  Lab 09/27/18 1700  NA 136  K 4.9  CL 101  CO2 24  GLUCOSE 118*  BUN 19  CREATININE 1.36*  CALCIUM 8.8*   GFR: Estimated Creatinine Clearance: 40.4 mL/min (A) (by C-G formula based on SCr of 1.36 mg/dL (H)). Liver Function Tests: No results for input(s): AST, ALT, ALKPHOS, BILITOT, PROT, ALBUMIN in the last 168 hours. No results for input(s): LIPASE, AMYLASE in the last 168 hours. No results for input(s): AMMONIA in the last 168 hours. Coagulation Profile: Recent Labs  Lab 09/27/18 1700  INR 1.94   Cardiac Enzymes: No results for input(s): CKTOTAL, CKMB, CKMBINDEX, TROPONINI in the last 168 hours. BNP  (last 3 results) No results for input(s): PROBNP in the last 8760 hours. HbA1C: No results for input(s): HGBA1C in the last 72 hours. CBG: No results for input(s): GLUCAP in the last  168 hours. Lipid Profile: No results for input(s): CHOL, HDL, LDLCALC, TRIG, CHOLHDL, LDLDIRECT in the last 72 hours. Thyroid Function Tests: No results for input(s): TSH, T4TOTAL, FREET4, T3FREE, THYROIDAB in the last 72 hours. Anemia Panel: No results for input(s): VITAMINB12, FOLATE, FERRITIN, TIBC, IRON, RETICCTPCT in the last 72 hours. Urine analysis:    Component Value Date/Time   COLORURINE YELLOW 11/20/2012 1324   APPEARANCEUR CLEAR 11/20/2012 1324   LABSPEC 1.018 11/20/2012 1324   PHURINE 6.0 11/20/2012 1324   GLUCOSEU NEGATIVE 11/20/2012 1324   HGBUR NEGATIVE 11/20/2012 1324   BILIRUBINUR NEGATIVE 11/20/2012 1324   KETONESUR NEGATIVE 11/20/2012 1324   PROTEINUR NEGATIVE 11/20/2012 1324   UROBILINOGEN 0.2 11/20/2012 1324   NITRITE NEGATIVE 11/20/2012 1324   LEUKOCYTESUR NEGATIVE 11/20/2012 1324   Sepsis Labs: @LABRCNTIP (procalcitonin:4,lacticidven:4) )No results found for this or any previous visit (from the past 240 hour(s)).   Radiological Exams on Admission: Dg Chest 2 View  Result Date: 09/27/2018 CLINICAL DATA:  Chest pain EXAM: CHEST - 2 VIEW COMPARISON:  11/20/2012 chest radiograph. FINDINGS: Stable configuration of 2 lead left subclavian pacemaker. Stable cardiomediastinal silhouette with mild cardiomegaly and large hiatal hernia. No pneumothorax. Hyperinflated lungs. Small bilateral pleural effusions. No overt pulmonary edema. Mild bibasilar atelectasis. IMPRESSION: 1. Small bilateral pleural effusions with mild bibasilar atelectasis. 2. Mild cardiomegaly without overt pulmonary edema. 3. Large hiatal hernia. Electronically Signed   By: Delbert Phenix M.D.   On: 09/27/2018 17:44     EKG: Independently reviewed.  Sinus rhythm, QTC 373, bigeminy.  Nonspecific T wave  change.  Assessment/Plan Principal Problem:   Chest pain Active Problems:   Essential hypertension, benign   Coronary atherosclerosis of native coronary artery   Hyperlipidemia   Atrial fibrillation (HCC)   Acute renal failure superimposed on stage 2 chronic kidney disease (HCC)   Chronic diastolic (congestive) heart failure (HCC)   Chest pain and hx of CAD: s/p of stent. Patient has pleuritic chest pain, which is aggravated by deep breath.  Patient is on Xarelto, though less likely to have PE, yet cannot completely rule out this possibility given positive d-dimer.  No signs of infection, less likely to have pneumonia. Will also need to rule out pericarditis.  - will place on Tele bed for obs - cycle CE q6 x3 and repeat EKG in the am  - prn Nitroglycerin, lipitor and metoprol - prn percocet for CP (pt is allergic to hydromorphone, cannot tolerate morphine per patient. - Risk factor stratification: will check FLP and A1C  - 2d echo - will get CTA to r/o PE - LE doppler to r/o DVT - card consult was requested via Epic   Addendum: CTA showed 1. No pulmonary embolus. 2. Small bilateral pleural effusions and adjacent atelectasis. 3. Large hiatal hernia. -will add protonix   HTN:  -Continue home medications: Metoprolol -IV hydralazine prn  Hyperlipidemia: -Lipitor  Atrial Fibrillation: CHA2DS2-VASc Score is 5, needs oral anticoagulation. Patient is on Xarelto at home. Heart rate is well controlled. -Continue Xarelto and metoprolol  Acute renal failure superimposed on stage 2 chronic kidney disease (HCC): Baseline creatinine 1.0.  His creatinine is 1.38, BUN 19.  Likely due to dehydration and continuation of Lasix. -Hold Lasix -Follow-up renal function by BMP  Chronic diastolic (congestive) heart failure (HCC): Today, 11/25/2015 showed EF of 55 to 60% with grade 1 diastolic dysfunction.  Patient has trace leg edema, no JVD.  No acute respiratory distress.  No pulmonary edema  chest x-ray.  Does not  seem to have CHF exacerbation. -Hold Lasix due to worsening renal function -Check BNP    DVT ppx: on Xarelto Code Status: Full code Family Communication:  Yes, patient's son at bed side Disposition Plan:  Anticipate discharge back to previous home environment Consults called:  none Admission status: Obs / tele    Date of Service 09/27/2018    Lorretta Harp [facility] Pager [phone]  If 7PM-7AM, please contact night-coverage [email or web address] Password [facility] 09/27/2018, 8:39 PM

## 2018-09-27 NOTE — ED Triage Notes (Signed)
Pt brought in by [facility] from home for chest tightness and sharp pain with inspiration since 1500 today. Pt also c/o fatigue and generalized weakness since this am. Pt took x1 of his own nitro with relief. Per EMS pt is in bigeminy, has hx of demand pacemaker. Pt given 324mg  ASA and x1 nitro with EMS. Pt states tightness is now a 1/10 but the sharp pain is still present with inspiration. Pt is on xarelto. Pt A+Ox4, skin warm and dry.

## 2018-09-27 NOTE — ED Notes (Signed)
Patient transported to X-ray 

## 2018-09-28 ENCOUNTER — Observation Stay (HOSPITAL_BASED_OUTPATIENT_CLINIC_OR_DEPARTMENT_OTHER): Payer: Medicare Other

## 2018-09-28 DIAGNOSIS — R7989 Other specified abnormal findings of blood chemistry: Secondary | ICD-10-CM | POA: Diagnosis not present

## 2018-09-28 DIAGNOSIS — R079 Chest pain, unspecified: Secondary | ICD-10-CM | POA: Diagnosis not present

## 2018-09-28 DIAGNOSIS — R931 Abnormal findings on diagnostic imaging of heart and coronary circulation: Secondary | ICD-10-CM

## 2018-09-28 DIAGNOSIS — I25118 Atherosclerotic heart disease of native coronary artery with other forms of angina pectoris: Secondary | ICD-10-CM

## 2018-09-28 DIAGNOSIS — I5032 Chronic diastolic (congestive) heart failure: Secondary | ICD-10-CM | POA: Diagnosis not present

## 2018-09-28 LAB — HEMOGLOBIN A1C
Hgb A1c MFr Bld: 5.6 % (ref 4.8–5.6)
Mean Plasma Glucose: 114.02 mg/dL

## 2018-09-28 LAB — LIPID PANEL
Cholesterol: 99 mg/dL (ref 0–200)
HDL: 47 mg/dL (ref >40)
LDL Cholesterol: 46 mg/dL (ref 0–99)
Total CHOL/HDL Ratio: 2.1 RATIO
Triglycerides: 28 mg/dL (ref <150)
VLDL: 6 mg/dL (ref 0–40)

## 2018-09-28 LAB — ECHOCARDIOGRAM COMPLETE
Height: 68 in
WEIGHTICAEL: 2958.4 [oz_av]

## 2018-09-28 LAB — TROPONIN I: Troponin I: 0.04 ng/mL (ref <0.03)

## 2018-09-28 MED ORDER — PROSIGHT PO TABS
1.0000 | ORAL_TABLET | Freq: Every day | ORAL | Status: DC
  Administered 2018-09-28: 1 via ORAL
  Filled 2018-09-28: qty 1

## 2018-09-28 MED ORDER — ISOSORBIDE MONONITRATE ER 30 MG PO TB24
30.0000 mg | ORAL_TABLET | Freq: Every day | ORAL | Status: DC
  Administered 2018-09-28: 30 mg via ORAL
  Filled 2018-09-28: qty 1

## 2018-09-28 MED ORDER — ISOSORBIDE MONONITRATE ER 30 MG PO TB24: 30.0000 mg | ORAL_TABLET | Freq: Every day | ORAL | 1 refills | Status: DC

## 2018-09-28 NOTE — Consult Note (Addendum)
Cardiology Consultation:   Patient ID: Gregory Mccormick MRN: [id number]; DOB: 1928/12/07  Admit date: 09/27/2018 Date of Consult: 09/28/2018  Primary Care Provider: Tally Joe, MD Primary Cardiologist: Gregory Muss, MD  Primary Electrophysiologist:  None    Patient Profile:   Gregory Mccormick is a 84 y.o. male with a hx of paroxysmal atrial fibrillation, cad with stents in LAD and circumflex in 2014 with rotational atherectomy, cardiomyopathy, osa on cpap, 2nd degree av block on pacemaker, diastolic heart failure, ckd 2, essential hypertension who is being seen today for the evaluation of chest pain at the request of Dr. Clyde Mccormick.  History of Present Illness:   Gregory Mccormick is a 84 y.o. male with a hx of paroxysmal atrial fibrillation, cad with stents in LAD and circumflex in 2014 with rotational atherectomy, cardiomyopathy, osa on cpap, 2nd degree av block on pacemaker, diastolic heart failure, ckd 2, essential hypertension who was brought in for chest pain that started yesterday at 15:00 described as tight/sharp, 8/10 intensity, worsened with inspiration, accompanied with fatigue and generalized weakness, relieved with nitroglycerin. The patient was apparently lifting some heavy plywood on Wednesday and exerted himself. At baseline the patient is very active as he still works, walks long distances, and walks his dog.  Initial ekg was without any st or t wave changes. Troponin negative. Chest xray with bilateral pleural effusion. CT angio did not show any pulmonary embolism. TTE done today shows worsening EF to 30-35% along with diffuse hypokinesis.   Past Medical History:  Diagnosis Date  . Anemia, unspecified   . Arthritis    "right knee" (03/28/2013  . CAD in native artery    takes Xarelto daily  . Cardiomyopathy (HCC)    EF 40%:  ECHO 11/25/2015 EF 55%-60%  . Carpal tunnel syndrome   . Carpal tunnel syndrome, bilateral   . Epistaxis   . Essential hypertension, benign    takes  Metoprolol daily  . Exertional shortness of breath    "recently" (03/28/2013)  . H/O hiatal hernia   . History of atrial fibrillation   . History of kidney stones   . Hyperlipidemia    takes Lipitor daily  . Joint pain   . Nocturia   . Nonspecific abnormal unspecified cardiovascular function study   . OSA on CPAP    wears CPAP 02/03/16  . Other primary cardiomyopathies   . Pacemaker    MDT  . Paroxysmal ventricular tachycardia (HCC)   . Peripheral edema    takes Lasix daily  . PONV (postoperative nausea and vomiting)   . Vitamin D deficiency     Past Surgical History:  Procedure Laterality Date  . CARDIAC CATHETERIZATION  03/21/2013  . CARPAL TUNNEL RELEASE Right 02/05/2016   Procedure: RIGHT LIMITED OPEN CARPAL TUNNEL RELEASE;  Surgeon: Dominica Severin, MD;  Location: [facility] OR;  Service: Orthopedics;  Laterality: Right;  . CARPAL TUNNEL RELEASE Left 04/21/2016   Procedure: CARPAL TUNNEL RELEASE;  Surgeon: Dominica Severin, MD;  Location: [facility] OR;  Service: Orthopedics;  Laterality: Left;  . CATARACT EXTRACTION W/ INTRAOCULAR LENS  IMPLANT, BILATERAL Bilateral 2000's  . COLONOSCOPY    . CORONARY ANGIOPLASTY WITH STENT PLACEMENT  03/28/2013   "3 stents" (03/28/2013)  . INSERT / REPLACE / REMOVE PACEMAKER    . KNEE CARTILAGE SURGERY Left 1972   "cut it open" (03/28/2013)  . KNEE CARTILAGE SURGERY Right ~ 1977   "cut it open" (03/28/2013)  . PACEMAKER INSERTION  10/05/10   MDT Adapta L implanted  by Dr [name] for mobitz II second degree AV block  . PERCUTANEOUS CORONARY ROTOBLATOR INTERVENTION (PCI-R) N/A 03/28/2013   Procedure: PERCUTANEOUS CORONARY ROTOBLATOR INTERVENTION (PCI-R);  Surgeon: Corky Crafts, MD;  [facility] CATH LAB;  Service: Cardiovascular;  Laterality: N/A;  . SEPTOPLASTY N/A 02/05/2016   Procedure: SEPTOPLASTY WITH INTRANASAL SKIN GRAFT;  Surgeon: Drema Halon, MD;  Location: [facility] OR;  Service: ENT;  Laterality: N/A;  . SHOULDER HEMI-ARTHROPLASTY Right 1987    "got hit by sled & dragged down sheet > 200 feet; tore shoulder up" (03/28/2013)  . SHOULDER SURGERY Right 1970's   "pulled muscle apart; sewed it back together" (03/28/2013  . TOTAL KNEE ARTHROPLASTY Right 2007  . TOTAL KNEE ARTHROPLASTY  11/26/2012   Procedure: TOTAL KNEE ARTHROPLASTY;  Surgeon: Loanne Drilling, MD;  Location: [facility] ORS;  Service: Orthopedics;  Laterality: Left;  . TRANSURETHRAL RESECTION OF PROSTATE N/A 12/27/2017   Procedure: TRANSURETHRAL RESECTION OF THE PROSTATE (TURP);  Surgeon: Crista Elliot, MD;  Location[facility];  Service: Urology;  Laterality: N/A;       Inpatient Medications: Scheduled Meds: . atorvastatin  10 mg Oral q1800  . metoprolol tartrate  25 mg Oral Daily  . metoprolol tartrate  50 mg Oral QHS  . multivitamin  1 tablet Oral Daily  . omega-3 acid ethyl esters  1 g Oral Daily  . ondansetron (ZOFRAN) IV  4 mg Intravenous Once  . pantoprazole  40 mg Oral Q1200  . rivaroxaban  20 mg Oral Q supper   Continuous Infusions:  PRN Meds: acetaminophen, hydrALAZINE, nitroGLYCERIN, ondansetron (ZOFRAN) IV, oxyCODONE-acetaminophen, zolpidem  Allergies:    Allergies  Allergen Reactions  . Penicillins Hives    Has patient had a PCN reaction causing immediate rash, facial/tongue/throat swelling, SOB or lightheadedness with hypotension: YES Has patient had a PCN reaction causing severe rash involving mucus membranes or skin necrosis: No Has patient had a PCN reaction that required hospitalization No Has patient had a PCN reaction occurring within the last 10 years: No If all of the above answers are "NO", then may proceed with Cephalosporin use.   Marland Kitchen Hydromorphone Hcl Nausea And Vomiting    Social History:   Social History   Socioeconomic History  . Marital status: Married    Spouse name: Not on file  . Number of children: Not on file  . Years of education: Not on file  . Highest education level: Not on file  Occupational History    . Occupation: retired    Associate Professor: [employer]  Social Needs  . Financial resource strain: Not on file  . Food insecurity:    Worry: Not on file    Inability: Not on file  . Transportation needs:    Medical: Not on file    Non-medical: Not on file  Tobacco Use  . Smoking status: Former Smoker    Packs/day: 1.00    Years: 26.00    Pack years: 26.00    Types: Cigarettes    Last attempt to quit: 10/31/1973    Years since quitting: 44.9  . Smokeless tobacco: Never Used  Substance and Sexual Activity  . Alcohol use: No  . Drug use: No  . Sexual activity: Never  Lifestyle  . Physical activity:    Days per week: Not on file    Minutes per session: Not on file  . Stress: Not on file  Relationships  . Social connections:    Talks on phone:  Not on file    Gets together: Not on file    Attends religious service: Not on file    Active member of club or organization: Not on file    Attends meetings of clubs or organizations: Not on file    Relationship status: Not on file  . Intimate partner violence:    Fear of current or ex partner: Not on file    Emotionally abused: Not on file    Physically abused: Not on file    Forced sexual activity: Not on file  Other Topics Concern  . Not on file  Social History Narrative  . Not on file    Family History:    Family History  Problem Relation Age of Onset  . CAD Brother 60  . Stroke Brother   . Cancer Unknown   . Heart disease Unknown   . Heart attack Neg Hx   . Hypertension Neg Hx      ROS:  Please see the history of present illness.   All other ROS reviewed and negative.     Physical Exam/Data:   Vitals:   09/27/18 2120 09/27/18 2349 09/28/18 0538 09/28/18 0541  BP: (!) 145/68 133/60 (!) 144/63   Pulse: 82 73 71   Resp: 18 18 18    Temp: 98.7 F (37.1 C) 98.6 F (37 C) 97.8 F (36.6 C)   TempSrc: Oral Oral Oral   SpO2: 97% 99% 100%   Weight: 84.1 kg   83.9 kg  Height:        Intake/Output Summary (Last 24  hours) at 09/28/2018 1025 Last data filed at 09/28/2018 0600 Gross per 24 hour  Intake 0 ml  Output 200 ml  Net -200 ml   Filed Weights   09/27/18 1653 09/27/18 2120 09/28/18 0541  Weight: 83.9 kg 84.1 kg 83.9 kg   Body mass index is 28.11 kg/m.   Physical Exam  Constitutional: Appears well-developed and well-nourished. No distress.  HENT:  Head: Normocephalic and atraumatic.  Eyes: Conjunctivae are normal.  Cardiovascular: Normal rate, regular rhythm and normal heart sounds. Pacemaker in place Respiratory: Effort normal and breath sounds normal. No respiratory distress. No wheezes.  GI: Soft. Bowel sounds are normal. No distension. There is no tenderness.  Musculoskeletal: No edema.  Neurological: Is alert.  Skin: Not diaphoretic. No erythema.  Psychiatric: Normal mood and affect. Behavior is normal. Judgment and thought content normal.    EKG:  The EKG was personally reviewed and demonstrates:  PR prolongation, lvh, bigeminy  Telemetry:  Telemetry was personally reviewed and demonstrates:  NSR  Relevant CV Studies:  TTE 09/28/18 LV EF: 30% -   35%  ------------------------------------------------------------------- Indications:      Chest pain 786.51.  ------------------------------------------------------------------- History:   PMH:   Atrial fibrillation.  Coronary artery disease. Risk factors:  Dyslipidemia.  ------------------------------------------------------------------- Study Conclusions  - Left ventricle: The cavity size was normal. Wall thickness was   increased in a pattern of mild LVH. Systolic function was   moderately to severely reduced. The estimated ejection fraction   was in the range of 30% to 35%. Diffuse hypokinesis. The study is   not technically sufficient to allow evaluation of LV diastolic   function. LV filling pressure is elevated. - Aortic valve: Calcified leaflets. Moderate stenosis. Mean   gradient (S): 22 mm Hg. Valve area  (VTI): 1.35 cm^2. Valve area   (Vmean): 1.5 cm^2. - Mitral valve: Mildly thickened leaflets . There was mild   regurgitation. -  Left atrium: Moderately dilated. - Right ventricle: The cavity size was mildly dilated. - Right atrium: Severely dilated. - Tricuspid valve: There was mild regurgitation. - Pulmonary arteries: PA peak pressure: 46 mm Hg (S). - Inferior vena cava: The vessel was dilated. The respirophasic   diameter changes were blunted (< 50%), consistent with elevated   central venous pressure.  Impressions:  - Compared to a prior study in 2017, the LVEF is lower at 30-35%.   There is now moderate aortic stenosis with a mean gradient of 22   mmHg.   Laboratory Data:  Chemistry Recent Labs  Lab 09/27/18 1700  NA 136  K 4.9  CL 101  CO2 24  GLUCOSE 118*  BUN 19  CREATININE 1.36*  CALCIUM 8.8*  GFRNONAA 46*  GFRAA 54*  ANIONGAP 11    No results for input(s): PROT, ALBUMIN, AST, ALT, ALKPHOS, BILITOT in the last 168 hours. Hematology Recent Labs  Lab 09/27/18 1700  WBC 8.8  RBC 4.05*  HGB 12.3*  HCT 38.9*  MCV 96.0  MCH 30.4  MCHC 31.6  RDW 14.4  PLT 183   Cardiac Enzymes Recent Labs  Lab 09/27/18 2020 09/28/18 0251  TROPONINI 0.03* 0.04*    Recent Labs  Lab 09/27/18 1708  TROPIPOC 0.00    BNP Recent Labs  Lab 09/27/18 2020  BNP 539.4*    DDimer  Recent Labs  Lab 09/27/18 2020  DDIMER 0.76*    Radiology/Studies:  Dg Chest 2 View  Result Date: 09/27/2018 CLINICAL DATA:  Chest pain EXAM: CHEST - 2 VIEW COMPARISON:  11/20/2012 chest radiograph. FINDINGS: Stable configuration of 2 lead left subclavian pacemaker. Stable cardiomediastinal silhouette with mild cardiomegaly and large hiatal hernia. No pneumothorax. Hyperinflated lungs. Small bilateral pleural effusions. No overt pulmonary edema. Mild bibasilar atelectasis. IMPRESSION: 1. Small bilateral pleural effusions with mild bibasilar atelectasis. 2. Mild cardiomegaly without  overt pulmonary edema. 3. Large hiatal hernia. Electronically Signed   By: Delbert Phenix M.D.   On: 09/27/2018 17:44   Ct Angio Chest Pe W Or Wo Contrast  Result Date: 09/27/2018 CLINICAL DATA:  PE suspected, intermediate prob, positive D-dimer. Chest pain. EXAM: CT ANGIOGRAPHY CHEST WITH CONTRAST TECHNIQUE: Multidetector CT imaging of the chest was performed using the standard protocol during bolus administration of intravenous contrast. Multiplanar CT image reconstructions and MIPs were obtained to evaluate the vascular anatomy. CONTRAST:  85mL ISOVUE-370 IOPAMIDOL (ISOVUE-370) INJECTION 76% COMPARISON:  Radiographs earlier this day. Chest CT 12/17/2008. Report from chest CT 03/24/2011, those images are unavailable. FINDINGS: Cardiovascular: There are no filling defects within the pulmonary arteries to suggest pulmonary embolus. Multi chamber cardiomegaly. Tiny pericardial effusion. There are coronary artery calcifications and stents. Atherosclerosis of the thoracic aorta without aneurysm. Mediastinum/Nodes: Small calcified right hilar nodes. Small noncalcified prevascular and paratracheal nodes, not enlarged by size criteria. Large hiatal hernia, the majority of the stomach is intrathoracic. No abnormal gastric distension. No visualized thyroid nodule. Lungs/Pleura: Small bilateral pleural effusions and adjacent atelectasis. Compressive atelectasis adjacent to hiatal hernia. Mild apical prominent emphysema. Previous right middle lobe and lower lobe pulmonary nodules are not well visualized. No increasing nodule or pulmonary mass. No pulmonary edema. Upper Abdomen: No acute findings. Musculoskeletal: There are no acute or suspicious osseous abnormalities. Prior injury to the right coracoclavicular ligament. Review of the MIP images confirms the above findings. IMPRESSION: 1. No pulmonary embolus. 2. Small bilateral pleural effusions and adjacent atelectasis. 3. Large hiatal hernia. Aortic Atherosclerosis  (ICD10-I70.0) and Emphysema (ICD10-J43.9). Electronically Signed  By: Narda Rutherford M.D.   On: 09/27/2018 22:47    Assessment and Plan:   CAD Patient is currently chest pain free. His EKG showed lvh, ventricularly paced rhythm, and pr prolongation. Troponin peaked at 0.04. Some concerning symptoms for typical chest pain include chest pain after exertion, relief with nitroglycerin, lasting few hours. Will probably benefit form a cath at a later point due to low ef.   Patient found to have decreased ejection fraction in comparison to last echo in 2017 . Echocardiogram shows lvef 30-35%, diffuse hypokinesis, mild mitral regurgitation, severely dilated right atrium.   -continue metoprolol 25 in morning and 50mg  qhs -atorvastatin 10mg  qhs -started imdur 30mg  qd -prn hydralazine  -prn nitroglycerin -A1C=5.6 -Lipid panel: tcholes=99, trig=28, hdl=47, ldl=46  Paroxysmal atrial fibrillation Patient is in normal sinus rhythm.  -Continue xarelto   Second degree AV block with pacemaker in place -Cardiac monitoring   Chronic diastolic heart failure  Patient with worsening ejection fraction. BNP=539.   Essential hypertension  Patient's blood pressure has ranged 120-140/60-90s over the past 24 hrs.  [facility] will sign off.   Medication Recommendations:  Imdur 30mg  qd Other recommendations (labs, testing, etc):  Possible cath Follow up as an outpatient:  Dr. Eldridge Dace on Dec 9th   For questions or updates, please contact [facility] Please consult [email or web address] for contact info under     Signed, Lorenso Courier, MD  09/28/2018 11:50 AM  I have examined the patient and reviewed assessment and plan and discussed with patient.  Agree with above as stated.  Atypical features to chest pain.  Essentially flat troponin after hours of chest pain. EF diffusely decreased.  Add Imdur.  Conssdier cath as outpatient if he continues to have sx, or to possibly w/u decreased LV function.   He is agreeable to this plan.   Gregory Mccormick

## 2018-09-28 NOTE — Progress Notes (Signed)
  Echocardiogram 2D Echocardiogram has been performed.  Dorothey Baseman 09/28/2018, 9:13 AM

## 2018-09-28 NOTE — Progress Notes (Signed)
Troponin 0.04, was 0.03, no s/s, MD notified, will continue to monitor, Thanks MIke F RN.

## 2018-09-28 NOTE — Discharge Summary (Addendum)
Physician Discharge Summary  QUINDELL REUTHER LVD:[id number] DOB: 1929-04-01 DOA: 09/27/2018  PCP: Tally Joe, MD  Admit date: 09/27/2018 Discharge date: 09/28/2018  Time spent: 35 minutes  Recommendations for Outpatient Follow-up:  1. Follow-up cardiology on December 9    Discharge Diagnoses:  Principal Problem:   Chest pain Active Problems:   Essential hypertension, benign   Coronary atherosclerosis of native coronary artery   Hyperlipidemia   Atrial fibrillation (HCC)   Acute renal failure superimposed on stage 2 chronic kidney disease (HCC)   Chronic diastolic (congestive) heart failure [facility])   Discharge Condition: Stable  Diet recommendation: Heart healthy diet  Filed Weights   09/27/18 1653 09/27/18 2120 09/28/18 0541  Weight: 83.9 kg 84.1 kg 83.9 kg    History of present illness:  84 year old male with a history of hypertension, hyperlipidemia, BPH, CAD, stent placement, atrial fibrillation on Xarelto, obstructive sleep apnea not on CPAP, pacemaker placement due to second degree AV block, diastolic CHF, CKD stage II came to ED with chest pain.  Hospital Course:   Chest pain-resolved, cardiac enzymes were mildly elevated 0.04, cardiology was consulted.  Patient has been started on Imdur 30 mg p.o. daily.  He has an appointment to see cardiology on December 9 for possible outpatient cardiac catheterization.  Patient also had a CTA chest which ruled out pulmonary embolism.  Lower extremity venous Dopplers were negative for DVT.  Patient is on Xarelto.  CAD status post stent placement-stable, continue home medications.  Imdur added per cardiology.  Atrial fibrillation-continue Xarelto and metoprolol.  Chronic diastolic CHF-continue Lasix.    Procedures:  None  Consultations:  Cardiology  Discharge Exam: Vitals:   09/28/18 0538 09/28/18 1227  BP: (!) 144/63 (!) 123/56  Pulse: 71 80  Resp: 18 18  Temp: 97.8 F (36.6 C) 97.8 F (36.6 C)  SpO2: 100%  97%    General: Appears in no acute distress Cardiovascular: S1-S2, regular Respiratory: Clear to auscultation bilaterally  Discharge Instructions   Discharge Instructions    Diet - low sodium heart healthy   Complete by:  As directed    Increase activity slowly   Complete by:  As directed      Allergies as of 09/28/2018      Reactions   Penicillins Hives   Has patient had a PCN reaction causing immediate rash, facial/tongue/throat swelling, SOB or lightheadedness with hypotension: YES Has patient had a PCN reaction causing severe rash involving mucus membranes or skin necrosis: No Has patient had a PCN reaction that required hospitalization No Has patient had a PCN reaction occurring within the last 10 years: No If all of the above answers are "NO", then may proceed with Cephalosporin use.   Hydromorphone Hcl Nausea And Vomiting      Medication List    STOP taking these medications   HYDROcodone-acetaminophen 5-325 MG tablet Commonly known as:  NORCO/VICODIN     TAKE these medications   acetaminophen 500 MG tablet Commonly known as:  TYLENOL Take 1,000 mg by mouth at bedtime as needed for mild pain. For leg pain.   atorvastatin 10 MG tablet Commonly known as:  LIPITOR Take 1 tablet (10 mg total) by mouth daily at 6 PM.   FISH OIL PO Take 1 tablet by mouth daily.   furosemide 40 MG tablet Commonly known as:  LASIX TAKE 1 TABLET (40 MG TOTAL) BY MOUTH DAILY. What changed:    how much to take  how to take this  when to take  this  additional instructions   isosorbide mononitrate 30 MG 24 hr tablet Commonly known as:  IMDUR Take 1 tablet (30 mg total) by mouth daily.   metoprolol tartrate 25 MG tablet Commonly known as:  LOPRESSOR Take 25 mg in the am and Take  50  mg in the pm What changed:    how much to take  how to take this  when to take this   MOVE FREE PO Take 1 tablet by mouth daily.   nitroGLYCERIN 0.4 MG SL tablet Commonly known as:   NITROSTAT Place 1 tablet (0.4 mg total) under the tongue every 5 (five) minutes as needed for chest pain.   PRESERVISION AREDS 2 PO Take 2 capsules by mouth daily. 2 tabs daily   XARELTO 20 MG Tabs tablet Generic drug:  rivaroxaban TAKE 1 TABLET (20 MG TOTAL) BY MOUTH DAILY WITH SUPPER. What changed:  See the new instructions.      Allergies  Allergen Reactions  . Penicillins Hives    Has patient had a PCN reaction causing immediate rash, facial/tongue/throat swelling, SOB or lightheadedness with hypotension: YES Has patient had a PCN reaction causing severe rash involving mucus membranes or skin necrosis: No Has patient had a PCN reaction that required hospitalization No Has patient had a PCN reaction occurring within the last 10 years: No If all of the above answers are "NO", then may proceed with Cephalosporin use.   Marland Kitchen Hydromorphone Hcl Nausea And Vomiting   Follow-up Information    Corky Crafts, MD Follow up on 10/08/2018.   Specialties:  Cardiology, Radiology, Interventional Cardiology Why:  We will contact you to confirm follow up date and time. Contact information: [address] [place] [address] [phone]            The results of significant diagnostics from this hospitalization (including imaging, microbiology, ancillary and laboratory) are listed below for reference.    Significant Diagnostic Studies: Dg Chest 2 View  Result Date: 09/27/2018 CLINICAL DATA:  Chest pain EXAM: CHEST - 2 VIEW COMPARISON:  11/20/2012 chest radiograph. FINDINGS: Stable configuration of 2 lead left subclavian pacemaker. Stable cardiomediastinal silhouette with mild cardiomegaly and large hiatal hernia. No pneumothorax. Hyperinflated lungs. Small bilateral pleural effusions. No overt pulmonary edema. Mild bibasilar atelectasis. IMPRESSION: 1. Small bilateral pleural effusions with mild bibasilar atelectasis. 2. Mild cardiomegaly without overt pulmonary  edema. 3. Large hiatal hernia. Electronically Signed   By: Delbert Phenix M.D.   On: 09/27/2018 17:44   Ct Angio Chest Pe W Or Wo Contrast  Result Date: 09/27/2018 CLINICAL DATA:  PE suspected, intermediate prob, positive D-dimer. Chest pain. EXAM: CT ANGIOGRAPHY CHEST WITH CONTRAST TECHNIQUE: Multidetector CT imaging of the chest was performed using the standard protocol during bolus administration of intravenous contrast. Multiplanar CT image reconstructions and MIPs were obtained to evaluate the vascular anatomy. CONTRAST:  85mL ISOVUE-370 IOPAMIDOL (ISOVUE-370) INJECTION 76% COMPARISON:  Radiographs earlier this day. Chest CT 12/17/2008. Report from chest CT 03/24/2011, those images are unavailable. FINDINGS: Cardiovascular: There are no filling defects within the pulmonary arteries to suggest pulmonary embolus. Multi chamber cardiomegaly. Tiny pericardial effusion. There are coronary artery calcifications and stents. Atherosclerosis of the thoracic aorta without aneurysm. Mediastinum/Nodes: Small calcified right hilar nodes. Small noncalcified prevascular and paratracheal nodes, not enlarged by size criteria. Large hiatal hernia, the majority of the stomach is intrathoracic. No abnormal gastric distension. No visualized thyroid nodule. Lungs/Pleura: Small bilateral pleural effusions and adjacent atelectasis. Compressive atelectasis adjacent  to hiatal hernia. Mild apical prominent emphysema. Previous right middle lobe and lower lobe pulmonary nodules are not well visualized. No increasing nodule or pulmonary mass. No pulmonary edema. Upper Abdomen: No acute findings. Musculoskeletal: There are no acute or suspicious osseous abnormalities. Prior injury to the right coracoclavicular ligament. Review of the MIP images confirms the above findings. IMPRESSION: 1. No pulmonary embolus. 2. Small bilateral pleural effusions and adjacent atelectasis. 3. Large hiatal hernia. Aortic Atherosclerosis (ICD10-I70.0) and  Emphysema (ICD10-J43.9). Electronically Signed   By: Narda Rutherford M.D.   On: 09/27/2018 22:47    Microbiology: No results found for this or any previous visit (from the past 240 hour(s)).   Labs: Basic Metabolic Panel: Recent Labs  Lab 09/27/18 1700  NA 136  K 4.9  CL 101  CO2 24  GLUCOSE 118*  BUN 19  CREATININE 1.36*  CALCIUM 8.8*   Liver Function Tests: No results for input(s): AST, ALT, ALKPHOS, BILITOT, PROT, ALBUMIN in the last 168 hours. No results for input(s): LIPASE, AMYLASE in the last 168 hours. No results for input(s): AMMONIA in the last 168 hours. CBC: Recent Labs  Lab 09/27/18 1700  WBC 8.8  HGB 12.3*  HCT 38.9*  MCV 96.0  PLT 183   Cardiac Enzymes: Recent Labs  Lab 09/27/18 2020 09/28/18 0251  TROPONINI 0.03* 0.04*   BNP: BNP (last 3 results) Recent Labs    09/27/18 2020  BNP 539.4*     Signed:  Meredeth Ide MD.  [facility] 09/28/2018, 1:05 PM

## 2018-09-28 NOTE — Progress Notes (Deleted)
Cardiology Consultation:   Patient ID: Gregory Mccormick MRN: [id number]; DOB: 1928/12/07  Admit date: 09/27/2018 Date of Consult: 09/28/2018  Primary Care Provider: Tally Joe, MD Primary Cardiologist: Lance Muss, MD  Primary Electrophysiologist:  None    Patient Profile:   Gregory Mccormick is a 84 y.o. male with a hx of paroxysmal atrial fibrillation, cad with stents in LAD and circumflex in 2014 with rotational atherectomy, cardiomyopathy, osa on cpap, 2nd degree av block on pacemaker, diastolic heart failure, ckd 2, essential hypertension who is being seen today for the evaluation of chest pain at the request of Dr. Clyde Lundborg.  History of Present Illness:   Gregory Mccormick is a 84 y.o. male with a hx of paroxysmal atrial fibrillation, cad with stents in LAD and circumflex in 2014 with rotational atherectomy, cardiomyopathy, osa on cpap, 2nd degree av block on pacemaker, diastolic heart failure, ckd 2, essential hypertension who was brought in for chest pain that started yesterday at 15:00 described as tight/sharp, 8/10 intensity, worsened with inspiration, accompanied with fatigue and generalized weakness, relieved with nitroglycerin. The patient was apparently lifting some heavy plywood on Wednesday and exerted himself. At baseline the patient is very active as he still works, walks long distances, and walks his dog.  Initial ekg was without any st or t wave changes. Troponin negative. Chest xray with bilateral pleural effusion. CT angio did not show any pulmonary embolism. TTE done today shows worsening EF to 30-35% along with diffuse hypokinesis.   Past Medical History:  Diagnosis Date  . Anemia, unspecified   . Arthritis    "right knee" (03/28/2013  . CAD in native artery    takes Xarelto daily  . Cardiomyopathy (HCC)    EF 40%:  ECHO 11/25/2015 EF 55%-60%  . Carpal tunnel syndrome   . Carpal tunnel syndrome, bilateral   . Epistaxis   . Essential hypertension, benign    takes  Metoprolol daily  . Exertional shortness of breath    "recently" (03/28/2013)  . H/O hiatal hernia   . History of atrial fibrillation   . History of kidney stones   . Hyperlipidemia    takes Lipitor daily  . Joint pain   . Nocturia   . Nonspecific abnormal unspecified cardiovascular function study   . OSA on CPAP    wears CPAP 02/03/16  . Other primary cardiomyopathies   . Pacemaker    MDT  . Paroxysmal ventricular tachycardia (HCC)   . Peripheral edema    takes Lasix daily  . PONV (postoperative nausea and vomiting)   . Vitamin D deficiency     Past Surgical History:  Procedure Laterality Date  . CARDIAC CATHETERIZATION  03/21/2013  . CARPAL TUNNEL RELEASE Right 02/05/2016   Procedure: RIGHT LIMITED OPEN CARPAL TUNNEL RELEASE;  Surgeon: Dominica Severin, MD;  Location: [facility] OR;  Service: Orthopedics;  Laterality: Right;  . CARPAL TUNNEL RELEASE Left 04/21/2016   Procedure: CARPAL TUNNEL RELEASE;  Surgeon: Dominica Severin, MD;  Location: [facility] OR;  Service: Orthopedics;  Laterality: Left;  . CATARACT EXTRACTION W/ INTRAOCULAR LENS  IMPLANT, BILATERAL Bilateral 2000's  . COLONOSCOPY    . CORONARY ANGIOPLASTY WITH STENT PLACEMENT  03/28/2013   "3 stents" (03/28/2013)  . INSERT / REPLACE / REMOVE PACEMAKER    . KNEE CARTILAGE SURGERY Left 1972   "cut it open" (03/28/2013)  . KNEE CARTILAGE SURGERY Right ~ 1977   "cut it open" (03/28/2013)  . PACEMAKER INSERTION  10/05/10   MDT Adapta L implanted  by Dr [name] for mobitz II second degree AV block  . PERCUTANEOUS CORONARY ROTOBLATOR INTERVENTION (PCI-R) N/A 03/28/2013   Procedure: PERCUTANEOUS CORONARY ROTOBLATOR INTERVENTION (PCI-R);  Surgeon: Corky Crafts, MD;  Location[facility] CATH LAB;  Service: Cardiovascular;  Laterality: N/A;  . SEPTOPLASTY N/A 02/05/2016   Procedure: SEPTOPLASTY WITH INTRANASAL SKIN GRAFT;  Surgeon: Drema Halon, MD;  Location: [facility] OR;  Service: ENT;  Laterality: N/A;  . SHOULDER HEMI-ARTHROPLASTY Right 1987    "got hit by sled & dragged down sheet > 200 feet; tore shoulder up" (03/28/2013)  . SHOULDER SURGERY Right 1970's   "pulled muscle apart; sewed it back together" (03/28/2013  . TOTAL KNEE ARTHROPLASTY Right 2007  . TOTAL KNEE ARTHROPLASTY  11/26/2012   Procedure: TOTAL KNEE ARTHROPLASTY;  Surgeon: Loanne Drilling, MD;  Location: [facility] ORS;  Service: Orthopedics;  Laterality: Left;  . TRANSURETHRAL RESECTION OF PROSTATE N/A 12/27/2017   Procedure: TRANSURETHRAL RESECTION OF THE PROSTATE (TURP);  Surgeon: Crista Elliot, MD;  Location[facility];  Service: Urology;  Laterality: N/A;       Inpatient Medications: Scheduled Meds: . atorvastatin  10 mg Oral q1800  . metoprolol tartrate  25 mg Oral Daily  . metoprolol tartrate  50 mg Oral QHS  . multivitamin  1 tablet Oral Daily  . omega-3 acid ethyl esters  1 g Oral Daily  . ondansetron (ZOFRAN) IV  4 mg Intravenous Once  . pantoprazole  40 mg Oral Q1200  . rivaroxaban  20 mg Oral Q supper   Continuous Infusions:  PRN Meds: acetaminophen, hydrALAZINE, nitroGLYCERIN, ondansetron (ZOFRAN) IV, oxyCODONE-acetaminophen, zolpidem  Allergies:    Allergies  Allergen Reactions  . Penicillins Hives    Has patient had a PCN reaction causing immediate rash, facial/tongue/throat swelling, SOB or lightheadedness with hypotension: YES Has patient had a PCN reaction causing severe rash involving mucus membranes or skin necrosis: No Has patient had a PCN reaction that required hospitalization No Has patient had a PCN reaction occurring within the last 10 years: No If all of the above answers are "NO", then may proceed with Cephalosporin use.   Marland Kitchen Hydromorphone Hcl Nausea And Vomiting    Social History:   Social History   Socioeconomic History  . Marital status: Married    Spouse name: Not on file  . Number of children: Not on file  . Years of education: Not on file  . Highest education level: Not on file  Occupational History    . Occupation: retired    Associate Professor: [employer]  Social Needs  . Financial resource strain: Not on file  . Food insecurity:    Worry: Not on file    Inability: Not on file  . Transportation needs:    Medical: Not on file    Non-medical: Not on file  Tobacco Use  . Smoking status: Former Smoker    Packs/day: 1.00    Years: 26.00    Pack years: 26.00    Types: Cigarettes    Last attempt to quit: 10/31/1973    Years since quitting: 44.9  . Smokeless tobacco: Never Used  Substance and Sexual Activity  . Alcohol use: No  . Drug use: No  . Sexual activity: Never  Lifestyle  . Physical activity:    Days per week: Not on file    Minutes per session: Not on file  . Stress: Not on file  Relationships  . Social connections:    Talks on phone:  Not on file    Gets together: Not on file    Attends religious service: Not on file    Active member of club or organization: Not on file    Attends meetings of clubs or organizations: Not on file    Relationship status: Not on file  . Intimate partner violence:    Fear of current or ex partner: Not on file    Emotionally abused: Not on file    Physically abused: Not on file    Forced sexual activity: Not on file  Other Topics Concern  . Not on file  Social History Narrative  . Not on file    Family History:    Family History  Problem Relation Age of Onset  . CAD Brother 79  . Stroke Brother   . Cancer Unknown   . Heart disease Unknown   . Heart attack Neg Hx   . Hypertension Neg Hx      ROS:  Please see the history of present illness.   All other ROS reviewed and negative.     Physical Exam/Data:   Vitals:   09/27/18 2120 09/27/18 2349 09/28/18 0538 09/28/18 0541  BP: (!) 145/68 133/60 (!) 144/63   Pulse: 82 73 71   Resp: 18 18 18    Temp: 98.7 F (37.1 C) 98.6 F (37 C) 97.8 F (36.6 C)   TempSrc: Oral Oral Oral   SpO2: 97% 99% 100%   Weight: 84.1 kg   83.9 kg  Height:        Intake/Output Summary (Last 24  hours) at 09/28/2018 1025 Last data filed at 09/28/2018 0600 Gross per 24 hour  Intake 0 ml  Output 200 ml  Net -200 ml   Filed Weights   09/27/18 1653 09/27/18 2120 09/28/18 0541  Weight: 83.9 kg 84.1 kg 83.9 kg   Body mass index is 28.11 kg/m.   Physical Exam  Constitutional: Appears well-developed and well-nourished. No distress.  HENT:  Head: Normocephalic and atraumatic.  Eyes: Conjunctivae are normal.  Cardiovascular: Normal rate, regular rhythm and normal heart sounds. Pacemaker in place Respiratory: Effort normal and breath sounds normal. No respiratory distress. No wheezes.  GI: Soft. Bowel sounds are normal. No distension. There is no tenderness.  Musculoskeletal: No edema.  Neurological: Is alert.  Skin: Not diaphoretic. No erythema.  Psychiatric: Normal mood and affect. Behavior is normal. Judgment and thought content normal.    EKG:  The EKG was personally reviewed and demonstrates:  PR prolongation, lvh, ventricular paced rhythm  Telemetry:  Telemetry was personally reviewed and demonstrates:  NSR  Relevant CV Studies:  TTE 09/28/18 LV EF: 30% -   35%  ------------------------------------------------------------------- Indications:      Chest pain 786.51.  ------------------------------------------------------------------- History:   PMH:   Atrial fibrillation.  Coronary artery disease. Risk factors:  Dyslipidemia.  ------------------------------------------------------------------- Study Conclusions  - Left ventricle: The cavity size was normal. Wall thickness was   increased in a pattern of mild LVH. Systolic function was   moderately to severely reduced. The estimated ejection fraction   was in the range of 30% to 35%. Diffuse hypokinesis. The study is   not technically sufficient to allow evaluation of LV diastolic   function. LV filling pressure is elevated. - Aortic valve: Calcified leaflets. Moderate stenosis. Mean   gradient (S): 22 mm  Hg. Valve area (VTI): 1.35 cm^2. Valve area   (Vmean): 1.5 cm^2. - Mitral valve: Mildly thickened leaflets . There was mild  regurgitation. - Left atrium: Moderately dilated. - Right ventricle: The cavity size was mildly dilated. - Right atrium: Severely dilated. - Tricuspid valve: There was mild regurgitation. - Pulmonary arteries: PA peak pressure: 46 mm Hg (S). - Inferior vena cava: The vessel was dilated. The respirophasic   diameter changes were blunted (< 50%), consistent with elevated   central venous pressure.  Impressions:  - Compared to a prior study in 2017, the LVEF is lower at 30-35%.   There is now moderate aortic stenosis with a mean gradient of 22   mmHg.   Laboratory Data:  Chemistry Recent Labs  Lab 09/27/18 1700  NA 136  K 4.9  CL 101  CO2 24  GLUCOSE 118*  BUN 19  CREATININE 1.36*  CALCIUM 8.8*  GFRNONAA 46*  GFRAA 54*  ANIONGAP 11    No results for input(s): PROT, ALBUMIN, AST, ALT, ALKPHOS, BILITOT in the last 168 hours. Hematology Recent Labs  Lab 09/27/18 1700  WBC 8.8  RBC 4.05*  HGB 12.3*  HCT 38.9*  MCV 96.0  MCH 30.4  MCHC 31.6  RDW 14.4  PLT 183   Cardiac Enzymes Recent Labs  Lab 09/27/18 2020 09/28/18 0251  TROPONINI 0.03* 0.04*    Recent Labs  Lab 09/27/18 1708  TROPIPOC 0.00    BNP Recent Labs  Lab 09/27/18 2020  BNP 539.4*    DDimer  Recent Labs  Lab 09/27/18 2020  DDIMER 0.76*    Radiology/Studies:  Dg Chest 2 View  Result Date: 09/27/2018 CLINICAL DATA:  Chest pain EXAM: CHEST - 2 VIEW COMPARISON:  11/20/2012 chest radiograph. FINDINGS: Stable configuration of 2 lead left subclavian pacemaker. Stable cardiomediastinal silhouette with mild cardiomegaly and large hiatal hernia. No pneumothorax. Hyperinflated lungs. Small bilateral pleural effusions. No overt pulmonary edema. Mild bibasilar atelectasis. IMPRESSION: 1. Small bilateral pleural effusions with mild bibasilar atelectasis. 2. Mild  cardiomegaly without overt pulmonary edema. 3. Large hiatal hernia. Electronically Signed   By: Delbert Phenix M.D.   On: 09/27/2018 17:44   Ct Angio Chest Pe W Or Wo Contrast  Result Date: 09/27/2018 CLINICAL DATA:  PE suspected, intermediate prob, positive D-dimer. Chest pain. EXAM: CT ANGIOGRAPHY CHEST WITH CONTRAST TECHNIQUE: Multidetector CT imaging of the chest was performed using the standard protocol during bolus administration of intravenous contrast. Multiplanar CT image reconstructions and MIPs were obtained to evaluate the vascular anatomy. CONTRAST:  85mL ISOVUE-370 IOPAMIDOL (ISOVUE-370) INJECTION 76% COMPARISON:  Radiographs earlier this day. Chest CT 12/17/2008. Report from chest CT 03/24/2011, those images are unavailable. FINDINGS: Cardiovascular: There are no filling defects within the pulmonary arteries to suggest pulmonary embolus. Multi chamber cardiomegaly. Tiny pericardial effusion. There are coronary artery calcifications and stents. Atherosclerosis of the thoracic aorta without aneurysm. Mediastinum/Nodes: Small calcified right hilar nodes. Small noncalcified prevascular and paratracheal nodes, not enlarged by size criteria. Large hiatal hernia, the majority of the stomach is intrathoracic. No abnormal gastric distension. No visualized thyroid nodule. Lungs/Pleura: Small bilateral pleural effusions and adjacent atelectasis. Compressive atelectasis adjacent to hiatal hernia. Mild apical prominent emphysema. Previous right middle lobe and lower lobe pulmonary nodules are not well visualized. No increasing nodule or pulmonary mass. No pulmonary edema. Upper Abdomen: No acute findings. Musculoskeletal: There are no acute or suspicious osseous abnormalities. Prior injury to the right coracoclavicular ligament. Review of the MIP images confirms the above findings. IMPRESSION: 1. No pulmonary embolus. 2. Small bilateral pleural effusions and adjacent atelectasis. 3. Large hiatal hernia. Aortic  Atherosclerosis (ICD10-I70.0) and Emphysema (ICD10-J43.9).  Electronically Signed   By: Narda Rutherford M.D.   On: 09/27/2018 22:47    Assessment and Plan:   CAD Patient is currently chest pain free. His EKG showed lvh, ventricularly paced rhythm, and pr prolongation. Troponin peaked at 0.04. Some concerning symptoms for typical chest pain include chest pain after exertion, relief with nitroglycerin, lasting few hours. Will probably benefit form a cath at a later point due to low ef.   Patient found to have decreased ejection fraction in comparison to last echo in 2017 . Echocardiogram shows lvef 30-35%, diffuse hypokinesis, mild mitral regurgitation, severely dilated right atrium.   -continue metoprolol 25 in morning and 50mg  qhs -atorvastatin 10mg  qhs -started imdur 30mg  qd -prn hydralazine  -prn nitroglycerin -A1C=5.6 -Lipid panel: tcholes=99, trig=28, hdl=47, ldl=46  Paroxysmal atrial fibrillation Patient is in normal sinus rhythm.  -Continue xarelto   Second degree AV block with pacemaker in place -Cardiac monitoring   Chronic diastolic heart failure  Patient with worsening ejection fraction. BNP=539.   Essential hypertension  Patient's blood pressure has ranged 120-140/60-90s over the past 24 hrs.  [facility] will sign off.   Medication Recommendations:  Imdur 30mg  qd Other recommendations (labs, testing, etc):  Possible cath Follow up as an outpatient:  Dr. Eldridge Dace on Dec 9th   For questions or updates, please contact [facility] Please consult [email or web address] for contact info under     Signed, Lorenso Courier, MD  09/28/2018 11:50 AM

## 2018-09-28 NOTE — Plan of Care (Signed)
  Problem: Clinical Measurements: Goal: Will remain free from infection Outcome: Completed/Met   Problem: Activity: Goal: Risk for activity intolerance will decrease Outcome: Completed/Met   Problem: Nutrition: Goal: Adequate nutrition will be maintained Outcome: Completed/Met   Problem: Coping: Goal: Level of anxiety will decrease Outcome: Completed/Met   Problem: Pain Managment: Goal: General experience of comfort will improve Outcome: Completed/Met   Problem: Safety: Goal: Ability to remain free from injury will improve Outcome: Completed/Met   Problem: Skin Integrity: Goal: Risk for impaired skin integrity will decrease Outcome: Completed/Met   Problem: Activity: Goal: Ability to tolerate increased activity will improve Outcome: Completed/Met

## 2018-09-28 NOTE — Progress Notes (Signed)
*  PRELIMINARY RESULTS* Vascular Ultrasound Bilateral lower extremity venous duplex has been completed.  Preliminary findings: No evidence of deep vein thrombosis or baker's cysts bilaterally.   Chauncey Fischer 09/28/2018, 10:17 AM

## 2018-09-28 NOTE — Progress Notes (Signed)
Discharge instructions reviewed with patient and family, pt to follow up with PCP until discharge, iv removed, questions and concerns answered Stanford Breed RN 1:53 PM 09-28-2018

## 2018-10-08 ENCOUNTER — Other Ambulatory Visit: Payer: Self-pay | Admitting: Interventional Cardiology

## 2018-10-08 ENCOUNTER — Ambulatory Visit (INDEPENDENT_AMBULATORY_CARE_PROVIDER_SITE_OTHER): Payer: Medicare Other | Admitting: Interventional Cardiology

## 2018-10-08 ENCOUNTER — Encounter: Payer: Self-pay | Admitting: Interventional Cardiology

## 2018-10-08 VITALS — BP 122/80 | HR 53 | Ht 68.0 in | Wt 188.6 lb

## 2018-10-08 DIAGNOSIS — Z7901 Long term (current) use of anticoagulants: Secondary | ICD-10-CM | POA: Diagnosis not present

## 2018-10-08 DIAGNOSIS — I4821 Permanent atrial fibrillation: Secondary | ICD-10-CM

## 2018-10-08 DIAGNOSIS — I2510 Atherosclerotic heart disease of native coronary artery without angina pectoris: Secondary | ICD-10-CM | POA: Diagnosis not present

## 2018-10-08 DIAGNOSIS — R0609 Other forms of dyspnea: Secondary | ICD-10-CM

## 2018-10-08 LAB — CBC
HEMOGLOBIN: 12.8 g/dL — AB (ref 13.0–17.7)
Hematocrit: 39 % (ref 37.5–51.0)
MCH: 30.1 pg (ref 26.6–33.0)
MCHC: 32.8 g/dL (ref 31.5–35.7)
MCV: 92 fL (ref 79–97)
Platelets: 392 10*3/uL (ref 150–450)
RBC: 4.25 x10E6/uL (ref 4.14–5.80)
RDW: 14 % (ref 12.3–15.4)
WBC: 10.7 10*3/uL (ref 3.4–10.8)

## 2018-10-08 LAB — BASIC METABOLIC PANEL
BUN/Creatinine Ratio: 15 (ref 10–24)
BUN: 18 mg/dL (ref 8–27)
CO2: 23 mmol/L (ref 20–29)
Calcium: 9.1 mg/dL (ref 8.6–10.2)
Chloride: 95 mmol/L — ABNORMAL LOW (ref 96–106)
Creatinine, Ser: 1.17 mg/dL (ref 0.76–1.27)
GFR calc Af Amer: 64 mL/min/{1.73_m2} (ref >59)
GFR calc non Af Amer: 56 mL/min/{1.73_m2} — ABNORMAL LOW (ref >59)
Glucose: 112 mg/dL — ABNORMAL HIGH (ref 65–99)
Potassium: 4.3 mmol/L (ref 3.5–5.2)
Sodium: 138 mmol/L (ref 134–144)

## 2018-10-08 MED ORDER — AMIODARONE HCL 200 MG PO TABS: 400.0000 mg | ORAL_TABLET | Freq: Every day | ORAL | 3 refills | Status: DC

## 2018-10-08 NOTE — Progress Notes (Signed)
  Cardiology Office Note   Date:  10/08/2018   ID:  Gregory Mccormick, DOB 01/11/1929, MRN [id number]  PCP:  Swayne, David, MD    No chief complaint on file.  CAD/Fatigue  Wt Readings from Last 3 Encounters:  10/08/18 188 lb 9.6 oz (85.5 kg)  09/28/18 184 lb 14.4 oz (83.9 kg)  01/05/18 200 lb (90.7 kg)       History of Present Illness: Gregory Mccormick is a 84 y.o. male  who had a cardiomyopathy. He was found to have CAD. He had stents to the LAD and circumflex in 2014 with rotational atherectomy.  He has had atrial fibrillation in the past and has been treated with Xarelto.  He was hospitalized in November 2019 with atypical chest pain.  He ruled out for MI.  Given the nature of his symptoms, we elected for medical therapy rather than any further cardiac testing.  Ejection fraction was moderately decreased by echocardiogram.  At that time, his heart rate was well controlled, as he was in sinus rhythm with frequent PVCs.  He felt well upon leaving the hospital.  The day after he got home, he started noticing some fatigue.  It has persisted since that time.  Even a short walk will get him very fatigued.  All he does is eat and then feels like going back to sleep.  Denies : Chest pain. Dizziness. Leg edema. Nitroglycerin use. Orthopnea. Palpitations. Paroxysmal nocturnal dyspnea. Syncope.      Past Medical History:  Diagnosis Date  . Anemia, unspecified   . Arthritis    "right knee" (03/28/2013  . CAD in native artery    takes Xarelto daily  . Cardiomyopathy (HCC)    EF 40%:  ECHO 11/25/2015 EF 55%-60%  . Carpal tunnel syndrome   . Carpal tunnel syndrome, bilateral   . Epistaxis   . Essential hypertension, benign    takes Metoprolol daily  . Exertional shortness of breath    "recently" (03/28/2013)  . H/O hiatal hernia   . History of atrial fibrillation   . History of kidney stones   . Hyperlipidemia    takes Lipitor daily  . Joint pain   . Nocturia   . Nonspecific  abnormal unspecified cardiovascular function study   . OSA on CPAP    wears CPAP 02/03/16  . Other primary cardiomyopathies   . Pacemaker    MDT  . Paroxysmal ventricular tachycardia (HCC)   . Peripheral edema    takes Lasix daily  . PONV (postoperative nausea and vomiting)   . Vitamin D deficiency     Past Surgical History:  Procedure Laterality Date  . CARDIAC CATHETERIZATION  03/21/2013  . CARPAL TUNNEL RELEASE Right 02/05/2016   Procedure: RIGHT LIMITED OPEN CARPAL TUNNEL RELEASE;  Surgeon: Gregory Gramig, MD;  Location: [facility] OR;  Service: Orthopedics;  Laterality: Right;  . CARPAL TUNNEL RELEASE Left 04/21/2016   Procedure: CARPAL TUNNEL RELEASE;  Surgeon: Gregory Gramig, MD;  Location: [facility] OR;  Service: Orthopedics;  Laterality: Left;  . CATARACT EXTRACTION W/ INTRAOCULAR LENS  IMPLANT, BILATERAL Bilateral 2000's  . COLONOSCOPY    . CORONARY ANGIOPLASTY WITH STENT PLACEMENT  03/28/2013   "3 stents" (03/28/2013)  . INSERT / REPLACE / REMOVE PACEMAKER    . KNEE CARTILAGE SURGERY Left 1972   "cut it open" (03/28/2013)  . KNEE CARTILAGE SURGERY Right ~ 1977   "cut it open" (03/28/2013)  . PACEMAKER INSERTION  10/05/10   MDT Adapta L implanted   by Gregory Mccormick for mobitz II second degree AV block  . PERCUTANEOUS CORONARY ROTOBLATOR INTERVENTION (PCI-R) N/A 03/28/2013   Procedure: PERCUTANEOUS CORONARY ROTOBLATOR INTERVENTION (PCI-R);  Surgeon: Gregory Kubly S Hjalmer Iovino, MD;  Location: [facility];  Service: Cardiovascular;  Laterality: N/A;  . SEPTOPLASTY N/A 02/05/2016   Procedure: SEPTOPLASTY WITH INTRANASAL SKIN GRAFT;  Surgeon: Gregory E Newman, MD;  Location: [facility] OR;  Service: ENT;  Laterality: N/A;  . SHOULDER HEMI-ARTHROPLASTY Right 1987   "got hit by sled & dragged down sheet > 200 feet; tore shoulder up" (03/28/2013)  . SHOULDER SURGERY Right 1970's   "pulled muscle apart; sewed it back together" (03/28/2013  . TOTAL KNEE ARTHROPLASTY Right 2007  . TOTAL KNEE ARTHROPLASTY  11/26/2012    Procedure: TOTAL KNEE ARTHROPLASTY;  Surgeon: Gregory V Aluisio, MD;  Location: [facility];  Service: Orthopedics;  Laterality: Left;  . TRANSURETHRAL RESECTION OF PROSTATE N/A 12/27/2017   Procedure: TRANSURETHRAL RESECTION OF THE PROSTATE (TURP);  Surgeon: Bell, Gregory D III, MD;  Location: [facility];  Service: Urology;  Laterality: N/A;     Current Outpatient Medications  Medication Sig Dispense Refill  . acetaminophen (TYLENOL) 500 MG tablet Take 1,000 mg by mouth at bedtime as needed for mild pain. For leg pain.    . atorvastatin (LIPITOR) 10 MG tablet Take 1 tablet (10 mg total) by mouth daily at 6 PM. 90 tablet 3  . furosemide (LASIX) 40 MG tablet TAKE 1 TABLET (40 MG TOTAL) BY MOUTH DAILY. (Patient taking differently: Take 40 mg by mouth daily. ) 90 tablet 3  . Glucosamine-Chondroitin (MOVE FREE PO) Take 1 tablet by mouth daily.     . isosorbide mononitrate (IMDUR) 30 MG 24 hr tablet Take 1 tablet (30 mg total) by mouth daily. 30 tablet 1  . metoprolol tartrate (LOPRESSOR) 25 MG tablet Take 25 mg in the am and Take  50  mg in the pm (Patient taking differently: Take 25-50 mg by mouth See admin instructions. Take 25 mg in the am and Take  50  mg in the pm) 90 tablet 11  . Multiple Vitamins-Minerals (PRESERVISION AREDS 2 PO) Take 2 capsules by mouth daily. 2 tabs daily     . Omega-3 Fatty Acids (FISH OIL PO) Take 1 tablet by mouth daily.    . XARELTO 20 MG TABS tablet TAKE 1 TABLET (20 MG TOTAL) BY MOUTH DAILY WITH SUPPER. (Patient taking differently: Take 20 mg by mouth daily with supper. ) 30 tablet 11  . nitroGLYCERIN (NITROSTAT) 0.4 MG SL tablet Place 1 tablet (0.4 mg total) under the tongue every 5 (five) minutes as needed for chest pain. 25 tablet 5   No current facility-administered medications for this visit.     Allergies:   Penicillins and Hydromorphone hcl    Social History:  The patient  reports that he quit smoking about 44 years ago. His smoking use included  cigarettes. He has a 26.00 pack-year smoking history. He has never used smokeless tobacco. He reports that he does not drink alcohol or use drugs.   Family History:  The patient's family history includes CAD (age of onset: 75) in his brother; Cancer in his unknown relative; Heart disease in his unknown relative; Stroke in his brother.    ROS:  Please see the history of present illness.   Otherwise, review of systems are positive for fatigue.   All other systems are reviewed and negative.    PHYSICAL EXAM: VS:  BP 122/80     Pulse (!) 53   Ht 5' 8" (1.727 m)   Wt 188 lb 9.6 oz (85.5 kg)   SpO2 93%   BMI 28.68 kg/m  , BMI Body mass index is 28.68 kg/m. GEN: Well nourished, well developed, in no acute distress  HEENT: normal  Neck: no JVD, carotid bruits, or masses Cardiac: Tachycardic, irregular; no murmurs, rubs, or gallops,no edema  Respiratory:  clear to auscultation bilaterally, normal work of breathing GI: soft, nontender, nondistended, + BS MS: no deformity or atrophy  Skin: warm and dry, no rash Neuro:  Strength and sensation are intact Psych: euthymic mood, full affect   EKG:   The ekg ordered today demonstrates atrial fibrillation, rapid ventricular response   Recent Labs: 12/26/2017: ALT 14 09/27/2018: B Natriuretic Peptide 539.4; BUN 19; Creatinine, Ser 1.36; Hemoglobin 12.3; Platelets 183; Potassium 4.9; Sodium 136   Lipid Panel    Component Value Date/Time   CHOL 99 09/28/2018 0251   CHOL 108 11/17/2017 0848   TRIG 28 09/28/2018 0251   HDL 47 09/28/2018 0251   HDL 56 11/17/2017 0848   CHOLHDL 2.1 09/28/2018 0251   VLDL 6 09/28/2018 0251   LDLCALC 46 09/28/2018 0251   LDLCALC 39 11/17/2017 0848     Other studies Reviewed: Additional studies/ records that were reviewed today with results demonstrating: EF 30-35% in 11/19.   ASSESSMENT AND PLAN:  1. CAD: No angina on aggressive medical therapy.  No further chest discomfort. 2. Atrial fibrillation: He is  back in atrial fibrillation with RVR.  This is the cause of his fatigue/DOE.  We will start amiodarone 400 mg daily.  Plan for cardioversion tomorrow.  The procedure was explained to the patient.  He has had this several years ago.  We also discussed putting him in the hospital.  I think if he went to the emergency room, he would get a cardioversion and likely be sent home.  Therefore, we will expedite cardioversion.  3. Cardiomyopathy: We will give 1 extra dose of Lasix today, since he will be missing his dose tomorrow morning before the procedure. 4. Long-term, would plan on amiodarone therapy to maintain sinus rhythm since he is quite symptomatic when out of rhythm.   Current medicines are reviewed at length with the patient today.  The patient concerns regarding his medicines were addressed.  The following changes have been made:  No change  Labs/ tests ordered today include:  No orders of the defined types were placed in this encounter.   Recommend 150 minutes/week of aerobic exercise Low fat, low carb, high fiber diet recommended  Disposition:   FU in 2-3 weeks   Signed, Desani Sprung, MD  10/08/2018 10:13 AM    [facility] [address], [place], [place]  [address] Phone: [phone]; Fax: [phone]   

## 2018-10-08 NOTE — Addendum Note (Signed)
Addended by: Daleen Bo I on: 10/08/2018 11:05 AM   Modules accepted: Orders

## 2018-10-08 NOTE — Patient Instructions (Addendum)
Medication Instructions:  Your physician has recommended you make the following change in your medication:   1. START: amiodarone 200 mg tablet: Take 2 tablets (400 mg total) by mouth once a day  2. Take an extra 40 mg tablet of furosemide (lasix) today  If you need a refill on your cardiac medications before your next appointment, please call your pharmacy.   Lab work: TODAY: CBC, BMET  If you have labs (blood work) drawn today and your tests are completely normal, you will receive your results only by: Marland Kitchen MyChart Message (if you have MyChart) OR . A paper copy in the mail If you have any lab test that is abnormal or we need to change your treatment, we will call you to review the results.  Testing/Procedures: Your physician has recommended that you have a Cardioversion (DCCV) TOMORROW. Electrical Cardioversion uses a jolt of electricity to your heart either through paddles or wired patches attached to your chest. This is a controlled, usually prescheduled, procedure. Defibrillation is done under light anesthesia in the hospital, and you usually go home the day of the procedure. This is done to get your heart back into a normal rhythm. You are not awake for the procedure. Please see the instruction sheet given to you today.  Follow-Up: . Your physician recommends that you schedule a follow-up appointment in: 2 weeks with Dr. Eldridge Dace   Any Other Special Instructions Will Be Listed Below (If Applicable).  CARDIOVERSION INSTRUCTIONS You are scheduled for a Cardioversion TOMORROW 10/09/18 with Dr. Eden Emms.  Please arrive at the [facility] (Main Entrance A) at [facility]: [address] [place], [place] [address] at 11:15 AM. (1 hour prior to procedure)  DIET: Nothing to eat or drink after midnight except a sip of water with medications (see medication instructions below)  Medication Instructions: Hold furosemide (lasix) the morning of your procedure  Continue your  anticoagulant: Xarelto You will need to continue your anticoagulant after your procedure until you are told by your Provider that it is safe to stop   Labs: TODAY: CBC, BMET  You must have a responsible person to drive you home and stay in the waiting area during your procedure. Failure to do so could result in cancellation.  Bring your insurance cards.  *Special Note: Every effort is made to have your procedure done on time. Occasionally there are emergencies that occur at the hospital that may cause delays. Please be patient if a delay does occur.     Electrical Cardioversion Electrical cardioversion is the delivery of a jolt of electricity to restore a normal rhythm to the heart. A rhythm that is too fast or is not regular keeps the heart from pumping well. In this procedure, sticky patches or metal paddles are placed on the chest to deliver electricity to the heart from a device. This procedure may be done in an emergency if:  There is low or no blood pressure as a result of the heart rhythm.  Normal rhythm must be restored as fast as possible to protect the brain and heart from further damage.  It may save a life.  This procedure may also be done for irregular or fast heart rhythms that are not immediately life-threatening. Tell a health care provider about:  Any allergies you have.  All medicines you are taking, including vitamins, herbs, eye drops, creams, and over-the-counter medicines.  Any problems you or family members have had with anesthetic medicines.  Any blood disorders you have.  Any surgeries you have had.  Any medical conditions you have.  Whether you are pregnant or may be pregnant. What are the risks? Generally, this is a safe procedure. However, problems may occur, including:  Allergic reactions to medicines.  A blood clot that breaks free and travels to other parts of your body.  The possible return of an abnormal heart rhythm within hours or days  after the procedure.  Your heart stopping (cardiac arrest). This is rare.  What happens before the procedure? Medicines  Your health care provider may have you start taking: ? Blood-thinning medicines (anticoagulants) so your blood does not clot as easily. ? Medicines may be given to help stabilize your heart rate and rhythm.  Ask your health care provider about changing or stopping your regular medicines. This is especially important if you are taking diabetes medicines or blood thinners. General instructions  Plan to have someone take you home from the hospital or clinic.  If you will be going home right after the procedure, plan to have someone with you for 24 hours.  Follow instructions from your health care provider about eating or drinking restrictions. What happens during the procedure?  To lower your risk of infection: ? Your health care team will wash or sanitize their hands. ? Your skin will be washed with soap.  An IV tube will be inserted into one of your veins.  You will be given a medicine to help you relax (sedative).  Sticky patches (electrodes) or metal paddles may be placed on your chest.  An electrical shock will be delivered. The procedure may vary among health care providers and hospitals. What happens after the procedure?  Your blood pressure, heart rate, breathing rate, and blood oxygen level will be monitored until the medicines you were given have worn off.  Do not drive for 24 hours if you were given a sedative.  Your heart rhythm will be watched to make sure it does not change. This information is not intended to replace advice given to you by your health care provider. Make sure you discuss any questions you have with your health care provider. Document Released: 10/07/2002 Document Revised: 06/15/2016 Document Reviewed: 04/22/2016 Elsevier Interactive Patient Education  2017 [employer].

## 2018-10-08 NOTE — H&P (View-Only) (Signed)
Cardiology Office Note   Date:  10/08/2018   ID:  Gregory Mccormick, DOB 11/23/1928, MRN [id number]  PCP:  Tally Joe, MD    No chief complaint on file.  CAD/Fatigue  Wt Readings from Last 3 Encounters:  10/08/18 188 lb 9.6 oz (85.5 kg)  09/28/18 184 lb 14.4 oz (83.9 kg)  01/05/18 200 lb (90.7 kg)       History of Present Illness: Gregory Mccormick is a 84 y.o. male  who had a cardiomyopathy. He was found to have CAD. He had stents to the LAD and circumflex in 2014 with rotational atherectomy.  He has had atrial fibrillation in the past and has been treated with Xarelto.  He was hospitalized in November 2019 with atypical chest pain.  He ruled out for MI.  Given the nature of his symptoms, we elected for medical therapy rather than any further cardiac testing.  Ejection fraction was moderately decreased by echocardiogram.  At that time, his heart rate was well controlled, as he was in sinus rhythm with frequent PVCs.  He felt well upon leaving the hospital.  The day after he got home, he started noticing some fatigue.  It has persisted since that time.  Even a short walk will get him very fatigued.  All he does is eat and then feels like going back to sleep.  Denies : Chest pain. Dizziness. Leg edema. Nitroglycerin use. Orthopnea. Palpitations. Paroxysmal nocturnal dyspnea. Syncope.      Past Medical History:  Diagnosis Date  . Anemia, unspecified   . Arthritis    "right knee" (03/28/2013  . CAD in native artery    takes Xarelto daily  . Cardiomyopathy (HCC)    EF 40%:  ECHO 11/25/2015 EF 55%-60%  . Carpal tunnel syndrome   . Carpal tunnel syndrome, bilateral   . Epistaxis   . Essential hypertension, benign    takes Metoprolol daily  . Exertional shortness of breath    "recently" (03/28/2013)  . H/O hiatal hernia   . History of atrial fibrillation   . History of kidney stones   . Hyperlipidemia    takes Lipitor daily  . Joint pain   . Nocturia   . Nonspecific  abnormal unspecified cardiovascular function study   . OSA on CPAP    wears CPAP 02/03/16  . Other primary cardiomyopathies   . Pacemaker    MDT  . Paroxysmal ventricular tachycardia (HCC)   . Peripheral edema    takes Lasix daily  . PONV (postoperative nausea and vomiting)   . Vitamin D deficiency     Past Surgical History:  Procedure Laterality Date  . CARDIAC CATHETERIZATION  03/21/2013  . CARPAL TUNNEL RELEASE Right 02/05/2016   Procedure: RIGHT LIMITED OPEN CARPAL TUNNEL RELEASE;  Surgeon: Dominica Severin, MD;  Location: [facility];  Service: Orthopedics;  Laterality: Right;  . CARPAL TUNNEL RELEASE Left 04/21/2016   Procedure: CARPAL TUNNEL RELEASE;  Surgeon: Dominica Severin, MD;  Location: [facility] OR;  Service: Orthopedics;  Laterality: Left;  . CATARACT EXTRACTION W/ INTRAOCULAR LENS  IMPLANT, BILATERAL Bilateral 2000's  . COLONOSCOPY    . CORONARY ANGIOPLASTY WITH STENT PLACEMENT  03/28/2013   "3 stents" (03/28/2013)  . INSERT / REPLACE / REMOVE PACEMAKER    . KNEE CARTILAGE SURGERY Left 1972   "cut it open" (03/28/2013)  . KNEE CARTILAGE SURGERY Right ~ 1977   "cut it open" (03/28/2013)  . PACEMAKER INSERTION  10/05/10   MDT Adapta L implanted  by Dr [name] for mobitz II second degree AV block  . PERCUTANEOUS CORONARY ROTOBLATOR INTERVENTION (PCI-R) N/A 03/28/2013   Procedure: PERCUTANEOUS CORONARY ROTOBLATOR INTERVENTION (PCI-R);  Surgeon: Corky Crafts, MD;  Location: [facility] C[facility] LAB;  Service: Cardiovascular;  Laterality: N/A;  . SEPTOPLASTY N/A 02/05/2016   Procedure: SEPTOPLASTY WITH INTRANASAL SKIN GRAFT;  Surgeon: Drema Halon, MD;  Location: [facility] OR;  Service: ENT;  Laterality: N/A;  . SHOULDER HEMI-ARTHROPLASTY Right 1987   "got hit by sled & dragged down sheet > 200 feet; tore shoulder up" (03/28/2013)  . SHOULDER SURGERY Right 1970's   "pulled muscle apart; sewed it back together" (03/28/2013  . TOTAL KNEE ARTHROPLASTY Right 2007  . TOTAL KNEE ARTHROPLASTY  11/26/2012    Procedure: TOTAL KNEE ARTHROPLASTY;  Surgeon: Loanne Drilling, MD;  Location: [facility] ORS;  Service: Orthopedics;  Laterality: Left;  . TRANSURETHRAL RESECTION OF PROSTATE N/A 12/27/2017   Procedure: TRANSURETHRAL RESECTION OF THE PROSTATE (TURP);  Surgeon: Crista Elliot, MD;  Location: [facility];  Service: Urology;  Laterality: N/A;     Current Outpatient Medications  Medication Sig Dispense Refill  . acetaminophen (TYLENOL) 500 MG tablet Take 1,000 mg by mouth at bedtime as needed for mild pain. For leg pain.    Marland Kitchen atorvastatin (LIPITOR) 10 MG tablet Take 1 tablet (10 mg total) by mouth daily at 6 PM. 90 tablet 3  . furosemide (LASIX) 40 MG tablet TAKE 1 TABLET (40 MG TOTAL) BY MOUTH DAILY. (Patient taking differently: Take 40 mg by mouth daily. ) 90 tablet 3  . Glucosamine-Chondroitin (MOVE FREE PO) Take 1 tablet by mouth daily.     . isosorbide mononitrate (IMDUR) 30 MG 24 hr tablet Take 1 tablet (30 mg total) by mouth daily. 30 tablet 1  . metoprolol tartrate (LOPRESSOR) 25 MG tablet Take 25 mg in the am and Take  50  mg in the pm (Patient taking differently: Take 25-50 mg by mouth See admin instructions. Take 25 mg in the am and Take  50  mg in the pm) 90 tablet 11  . Multiple Vitamins-Minerals (PRESERVISION AREDS 2 PO) Take 2 capsules by mouth daily. 2 tabs daily     . Omega-3 Fatty Acids (FISH OIL PO) Take 1 tablet by mouth daily.    Carlena Hurl 20 MG TABS tablet TAKE 1 TABLET (20 MG TOTAL) BY MOUTH DAILY WITH SUPPER. (Patient taking differently: Take 20 mg by mouth daily with supper. ) 30 tablet 11  . nitroGLYCERIN (NITROSTAT) 0.4 MG SL tablet Place 1 tablet (0.4 mg total) under the tongue every 5 (five) minutes as needed for chest pain. 25 tablet 5   No current facility-administered medications for this visit.     Allergies:   Penicillins and Hydromorphone hcl    Social History:  The patient  reports that he quit smoking about 44 years ago. His smoking use included  cigarettes. He has a 26.00 pack-year smoking history. He has never used smokeless tobacco. He reports that he does not drink alcohol or use drugs.   Family History:  The patient's family history includes CAD (age of onset: 82) in his brother; Cancer in his unknown relative; Heart disease in his unknown relative; Stroke in his brother.    ROS:  Please see the history of present illness.   Otherwise, review of systems are positive for fatigue.   All other systems are reviewed and negative.    PHYSICAL EXAM: VS:  BP 122/80  Pulse (!) 53   Ht 5\' 8"  (1.727 m)   Wt 188 lb 9.6 oz (85.5 kg)   SpO2 93%   BMI 28.68 kg/m  , BMI Body mass index is 28.68 kg/m. GEN: Well nourished, well developed, in no acute distress  HEENT: normal  Neck: no JVD, carotid bruits, or masses Cardiac: Tachycardic, irregular; no murmurs, rubs, or gallops,no edema  Respiratory:  clear to auscultation bilaterally, normal work of breathing GI: soft, nontender, nondistended, + BS MS: no deformity or atrophy  Skin: warm and dry, no rash Neuro:  Strength and sensation are intact Psych: euthymic mood, full affect   EKG:   The ekg ordered today demonstrates atrial fibrillation, rapid ventricular response   Recent Labs: 12/26/2017: ALT 14 09/27/2018: B Natriuretic Peptide 539.4; BUN 19; Creatinine, Ser 1.36; Hemoglobin 12.3; Platelets 183; Potassium 4.9; Sodium 136   Lipid Panel    Component Value Date/Time   CHOL 99 09/28/2018 0251   CHOL 108 11/17/2017 0848   TRIG 28 09/28/2018 0251   HDL 47 09/28/2018 0251   HDL 56 11/17/2017 0848   CHOLHDL 2.1 09/28/2018 0251   VLDL 6 09/28/2018 0251   LDLCALC 46 09/28/2018 0251   LDLCALC 39 11/17/2017 0848     Other studies Reviewed: Additional studies/ records that were reviewed today with results demonstrating: EF 30-35% in 11/19.   ASSESSMENT AND PLAN:  1. CAD: No angina on aggressive medical therapy.  No further chest discomfort. 2. Atrial fibrillation: He is  back in atrial fibrillation with RVR.  This is the cause of his fatigue/DOE.  We will start amiodarone 400 mg daily.  Plan for cardioversion tomorrow.  The procedure was explained to the patient.  He has had this several years ago.  We also discussed putting him in the hospital.  I think if he went to the emergency room, he would get a cardioversion and likely be sent home.  Therefore, we will expedite cardioversion.  3. Cardiomyopathy: We will give 1 extra dose of Lasix today, since he will be missing his dose tomorrow morning before the procedure. 4. Long-term, would plan on amiodarone therapy to maintain sinus rhythm since he is quite symptomatic when out of rhythm.   Current medicines are reviewed at length with the patient today.  The patient concerns regarding his medicines were addressed.  The following changes have been made:  No change  Labs/ tests ordered today include:  No orders of the defined types were placed in this encounter.   Recommend 150 minutes/week of aerobic exercise Low fat, low carb, high fiber diet recommended  Disposition:   FU in 2-3 weeks   Signed, Lance Muss, MD  10/08/2018 10:13 AM    [facility] [address] [place], [place], [place]  [address] Phone: [phone]; Fax: [phone]

## 2018-10-09 ENCOUNTER — Ambulatory Visit (HOSPITAL_COMMUNITY): Payer: Medicare Other | Admitting: Certified Registered"

## 2018-10-09 ENCOUNTER — Encounter (HOSPITAL_COMMUNITY)
Admission: RE | Disposition: A | Discharge status: Home / Self Care | Payer: Self-pay | Source: Ambulatory Visit | Attending: Cardiovascular Disease

## 2018-10-09 ENCOUNTER — Encounter (HOSPITAL_COMMUNITY): Payer: Self-pay | Admitting: *Deleted

## 2018-10-09 ENCOUNTER — Ambulatory Visit (HOSPITAL_COMMUNITY)
Admission: RE | Admit: 2018-10-09 | Discharge: 2018-10-09 | Disposition: A | Discharge status: Home / Self Care | Payer: Medicare Other | Source: Ambulatory Visit | Attending: Cardiovascular Disease | Admitting: Cardiovascular Disease

## 2018-10-09 DIAGNOSIS — I4821 Permanent atrial fibrillation: Secondary | ICD-10-CM

## 2018-10-09 DIAGNOSIS — M1990 Unspecified osteoarthritis, unspecified site: Secondary | ICD-10-CM | POA: Insufficient documentation

## 2018-10-09 DIAGNOSIS — I2510 Atherosclerotic heart disease of native coronary artery without angina pectoris: Secondary | ICD-10-CM | POA: Diagnosis not present

## 2018-10-09 DIAGNOSIS — Z87891 Personal history of nicotine dependence: Secondary | ICD-10-CM | POA: Diagnosis not present

## 2018-10-09 DIAGNOSIS — I429 Cardiomyopathy, unspecified: Secondary | ICD-10-CM | POA: Insufficient documentation

## 2018-10-09 DIAGNOSIS — Z950 Presence of cardiac pacemaker: Secondary | ICD-10-CM | POA: Insufficient documentation

## 2018-10-09 DIAGNOSIS — Z7901 Long term (current) use of anticoagulants: Secondary | ICD-10-CM | POA: Diagnosis not present

## 2018-10-09 DIAGNOSIS — E785 Hyperlipidemia, unspecified: Secondary | ICD-10-CM | POA: Insufficient documentation

## 2018-10-09 DIAGNOSIS — Z8249 Family history of ischemic heart disease and other diseases of the circulatory system: Secondary | ICD-10-CM | POA: Diagnosis not present

## 2018-10-09 DIAGNOSIS — Z955 Presence of coronary angioplasty implant and graft: Secondary | ICD-10-CM | POA: Insufficient documentation

## 2018-10-09 DIAGNOSIS — I4891 Unspecified atrial fibrillation: Secondary | ICD-10-CM | POA: Diagnosis not present

## 2018-10-09 DIAGNOSIS — E559 Vitamin D deficiency, unspecified: Secondary | ICD-10-CM | POA: Insufficient documentation

## 2018-10-09 DIAGNOSIS — I10 Essential (primary) hypertension: Secondary | ICD-10-CM | POA: Diagnosis not present

## 2018-10-09 DIAGNOSIS — G4733 Obstructive sleep apnea (adult) (pediatric): Secondary | ICD-10-CM | POA: Insufficient documentation

## 2018-10-09 DIAGNOSIS — Z880 Allergy status to penicillin: Secondary | ICD-10-CM | POA: Diagnosis not present

## 2018-10-09 HISTORY — PX: CARDIOVERSION: SHX1299

## 2018-10-09 SURGERY — CARDIOVERSION
Anesthesia: General

## 2018-10-09 MED ORDER — HYDROCORTISONE 1 % EX CREA
1.0000 "application " | TOPICAL_CREAM | Freq: Three times a day (TID) | CUTANEOUS | Status: DC | PRN
  Filled 2018-10-09: qty 28

## 2018-10-09 MED ORDER — SODIUM CHLORIDE 0.9 % IV SOLN
INTRAVENOUS | Status: DC | PRN
  Administered 2018-10-09: 12:00:00 via INTRAVENOUS

## 2018-10-09 MED ORDER — PROPOFOL 10 MG/ML IV BOLUS
INTRAVENOUS | Status: DC | PRN
  Administered 2018-10-09: 70 mg via INTRAVENOUS

## 2018-10-09 MED ORDER — LIDOCAINE HCL (CARDIAC) PF 100 MG/5ML IV SOSY
PREFILLED_SYRINGE | INTRAVENOUS | Status: DC | PRN
  Administered 2018-10-09: 60 mg via INTRAVENOUS

## 2018-10-09 NOTE — Anesthesia Preprocedure Evaluation (Addendum)
Anesthesia Evaluation  Patient identified by MRN, date of birth, ID band Patient awake    Reviewed: Allergy & Precautions, NPO status , Patient's Chart, lab work & pertinent test results  Airway Mallampati: II  TM Distance: >3 FB Neck ROM: Full    Dental no notable dental hx.    Pulmonary sleep apnea and Continuous Positive Airway Pressure Ventilation , former smoker,    Pulmonary exam normal breath sounds clear to auscultation       Cardiovascular hypertension, Pt. on medications and Pt. on home beta blockers + dysrhythmias Atrial Fibrillation + pacemaker  Rhythm:Irregular Rate:Normal  EF 55-60%   Neuro/Psych negative neurological ROS  negative psych ROS   GI/Hepatic negative GI ROS, Neg liver ROS,   Endo/Other  negative endocrine ROS  Renal/GU negative Renal ROS  negative genitourinary   Musculoskeletal negative musculoskeletal ROS (+)   Abdominal   Peds negative pediatric ROS (+)  Hematology negative hematology ROS (+)   Anesthesia Other Findings   Reproductive/Obstetrics negative OB ROS                            Anesthesia Physical Anesthesia Plan  ASA: III  Anesthesia Plan: General   Post-op Pain Management:    Induction: Intravenous  PONV Risk Score and Plan: 0  Airway Management Planned: Mask  Additional Equipment:   Intra-op Plan:   Post-operative Plan:   Informed Consent: I have reviewed the patients History and Physical, chart, labs and discussed the procedure including the risks, benefits and alternatives for the proposed anesthesia with the patient or authorized representative who has indicated his/her understanding and acceptance.   Dental advisory given  Plan Discussed with: CRNA and Surgeon  Anesthesia Plan Comments:         Anesthesia Quick Evaluation

## 2018-10-09 NOTE — Anesthesia Postprocedure Evaluation (Signed)
Anesthesia Post Note  Patient: Gregory Mccormick  Procedure(s) Performed: CARDIOVERSION (N/A )     Patient location during evaluation: PACU Anesthesia Type: General Level of consciousness: awake and alert Pain management: pain level controlled Vital Signs Assessment: post-procedure vital signs reviewed and stable Respiratory status: spontaneous breathing, nonlabored ventilation, respiratory function stable and patient connected to nasal cannula oxygen Cardiovascular status: blood pressure returned to baseline and stable Postop Assessment: no apparent nausea or vomiting Anesthetic complications: no    Last Vitals:  Vitals:   10/09/18 1121 10/09/18 1215  BP: (!) 124/95 (!) 84/52  Pulse: (!) 152 76  Resp: (!) 22 (!) 32  Temp: 36.6 C 36.7 C  SpO2: 95% 99%    Last Pain:  Vitals:   10/09/18 1215  TempSrc: Axillary  PainSc: 0-No pain                 Rosalynn Sergent S

## 2018-10-09 NOTE — Interval H&P Note (Signed)
History and Physical Interval Note:  10/09/2018 12:32 PM  Gregory Mccormick  has presented today for surgery, with the diagnosis of atrial fibrillation  The various methods of treatment have been discussed with the patient and family. After consideration of risks, benefits and other options for treatment, the patient has consented to  Procedure(s): CARDIOVERSION (N/A) as a surgical intervention .  The patient's history has been reviewed, patient examined, no change in status, stable for surgery.  I have reviewed the patient's chart and labs.  Questions were answered to the patient's satisfaction.     Charlton Haws

## 2018-10-09 NOTE — Discharge Instructions (Signed)
Electrical Cardioversion, Care After °This sheet gives you information about how to care for yourself after your procedure. Your health care provider may also give you more specific instructions. If you have problems or questions, contact your health care provider. °What can I expect after the procedure? °After the procedure, it is common to have: °· Some redness on the skin where the shocks were given. ° °Follow these instructions at home: °· Do not drive for 24 hours if you were given a medicine to help you relax (sedative). °· Take over-the-counter and prescription medicines only as told by your health care provider. °· Ask your health care provider how to check your pulse. Check it often. °· Rest for 48 hours after the procedure or as told by your health care provider. °· Avoid or limit your caffeine use as told by your health care provider. °Contact a health care provider if: °· You feel like your heart is beating too quickly or your pulse is not regular. °· You have a serious muscle cramp that does not go away. °Get help right away if: °· You have discomfort in your chest. °· You are dizzy or you feel faint. °· You have trouble breathing or you are short of breath. °· Your speech is slurred. °· You have trouble moving an arm or leg on one side of your body. °· Your fingers or toes turn cold or blue. °This information is not intended to replace advice given to you by your health care provider. Make sure you discuss any questions you have with your health care provider. °Document Released: 08/07/2013 Document Revised: 05/20/2016 Document Reviewed: 04/22/2016 °Elsevier Interactive Patient Education © 2018 [employer] ° °

## 2018-10-09 NOTE — Transfer of Care (Signed)
Immediate Anesthesia Transfer of Care Note  Patient: Gregory Mccormick  Procedure(s) Performed: CARDIOVERSION (N/A )  Patient Location: Endoscopy Unit  Anesthesia Type:General  Level of Consciousness: awake, alert  and sedated  Airway & Oxygen Therapy: Patient Spontanous Breathing and Patient connected to nasal cannula oxygen  Post-op Assessment: Report given to RN, Post -op Vital signs reviewed and stable and Patient moving all extremities X 4  Post vital signs: Reviewed and stable  Last Vitals:  Vitals Value Taken Time  BP    Temp    Pulse    Resp    SpO2      Last Pain:  Vitals:   10/09/18 1121  TempSrc: Oral  PainSc: 0-No pain         Complications: No apparent anesthesia complications

## 2018-10-09 NOTE — CV Procedure (Signed)
DCC: Anesthesia Propofol/Lidocaine  DCC x 2 120 J and 150 J converted from afib rate 110 to NSR with PVC;s rate 70 Confirmed by [employer] AICD interrogation  On Rx anticoagulation with no missed doses  No immediate neurologic sequelae   Charlton Haws

## 2018-10-17 NOTE — Progress Notes (Signed)
Cardiology Office Note   Date:  10/18/2018   ID:  Gregory Mccormick, DOB Feb 22, 1929, MRN [id number]  PCP:  Tally Joe, MD    No chief complaint on file.  AFib/CAD  Wt Readings from Last 3 Encounters:  10/18/18 184 lb (83.5 kg)  10/08/18 188 lb 9.6 oz (85.5 kg)  09/28/18 184 lb 14.4 oz (83.9 kg)       History of Present Illness: Gregory Mccormick is a 84 y.o. male  who had a cardiomyopathy. He was found to have CAD. He had stents to the LAD and circumflex in 2014 with rotational atherectomy.  He has had atrial fibrillation in the past and has been treated with Xarelto.  He was hospitalized in November 2019 with atypical chest pain.  He ruled out for MI.  Given the nature of his symptoms, we elected for medical therapy rather than any further cardiac testing.  Ejection fraction was moderately decreased by echocardiogram.  At that time, his heart rate was well controlled, as he was in sinus rhythm with frequent PVCs.  He was found to be in AFib.   He had cardioversion a few days later. He was back in NSR.  Since then, he still feels tired, but is better than he was at that time. No anginal sx.   Denies : Chest pain. Dizziness. Leg edema. Nitroglycerin use. Orthopnea. Palpitations. Paroxysmal nocturnal dyspnea. Shortness of breath. Syncope.   Past Medical History:  Diagnosis Date  . Anemia, unspecified   . Arthritis    "right knee" (03/28/2013  . CAD in native artery    takes Xarelto daily  . Cardiomyopathy (HCC)    EF 40%:  ECHO 11/25/2015 EF 55%-60%  . Carpal tunnel syndrome   . Carpal tunnel syndrome, bilateral   . Epistaxis   . Essential hypertension, benign    takes Metoprolol daily  . Exertional shortness of breath    "recently" (03/28/2013)  . H/O hiatal hernia   . History of atrial fibrillation   . History of kidney stones   . Hyperlipidemia    takes Lipitor daily  . Joint pain   . Nocturia   . Nonspecific abnormal unspecified cardiovascular function study    . OSA on CPAP    wears CPAP 02/03/16  . Other primary cardiomyopathies   . Pacemaker    MDT  . Paroxysmal ventricular tachycardia (HCC)   . Peripheral edema    takes Lasix daily  . PONV (postoperative nausea and vomiting)   . Vitamin D deficiency     Past Surgical History:  Procedure Laterality Date  . CARDIAC CATHETERIZATION  03/21/2013  . CARDIOVERSION N/A 10/09/2018   Procedure: CARDIOVERSION;  Surgeon: Wendall Stade, MD;  Location: [facility] ENDOSCOPY;  Service: Cardiovascular;  Laterality: N/A;  . CARPAL TUNNEL RELEASE Right 02/05/2016   Procedure: RIGHT LIMITED OPEN CARPAL TUNNEL RELEASE;  Surgeon: Dominica Severin, MD;  Location: [facility] OR;  Service: Orthopedics;  Laterality: Right;  . CARPAL TUNNEL RELEASE Left 04/21/2016   Procedure: CARPAL TUNNEL RELEASE;  Surgeon: Dominica Severin, MD;  Location: [facility] OR;  Service: Orthopedics;  Laterality: Left;  . CATARACT EXTRACTION W/ INTRAOCULAR LENS  IMPLANT, BILATERAL Bilateral 2000's  . COLONOSCOPY    . CORONARY ANGIOPLASTY WITH STENT PLACEMENT  03/28/2013   "3 stents" (03/28/2013)  . INSERT / REPLACE / REMOVE PACEMAKER    . KNEE CARTILAGE SURGERY Left 1972   "cut it open" (03/28/2013)  . KNEE CARTILAGE SURGERY Right ~ 1977   "  cut it open" (03/28/2013)  . PACEMAKER INSERTION  10/05/10   MDT Adapta L implanted by Dr [name] for mobitz II second degree AV block  . PERCUTANEOUS CORONARY ROTOBLATOR INTERVENTION (PCI-R) N/A 03/28/2013   Procedure: PERCUTANEOUS CORONARY ROTOBLATOR INTERVENTION (PCI-R);  Surgeon: Corky Crafts, MD;  Location: [facility] CATH LAB;  Service: Cardiovascular;  Laterality: N/A;  . SEPTOPLASTY N/A 02/05/2016   Procedure: SEPTOPLASTY WITH INTRANASAL SKIN GRAFT;  Surgeon: Drema Halon, MD;  Location: [facility] OR;  Service: ENT;  Laterality: N/A;  . SHOULDER HEMI-ARTHROPLASTY Right 1987   "got hit by sled & dragged down sheet > 200 feet; tore shoulder up" (03/28/2013)  . SHOULDER SURGERY Right 1970's   "pulled muscle apart; sewed it  back together" (03/28/2013  . TOTAL KNEE ARTHROPLASTY Right 2007  . TOTAL KNEE ARTHROPLASTY  11/26/2012   Procedure: TOTAL KNEE ARTHROPLASTY;  Surgeon: Loanne Drilling, MD;  Location: [facility] ORS;  Service: Orthopedics;  Laterality: Left;  . TRANSURETHRAL RESECTION OF PROSTATE N/A 12/27/2017   Procedure: TRANSURETHRAL RESECTION OF THE PROSTATE (TURP);  Surgeon: Crista Elliot, MD;  Location: [facility];  Service: Urology;  Laterality: N/A;     Current Outpatient Medications  Medication Sig Dispense Refill  . acetaminophen (TYLENOL) 500 MG tablet Take 1,000 mg by mouth daily as needed for mild pain or headache.     Marland Kitchen amiodarone (PACERONE) 200 MG tablet Take 2 tablets (400 mg total) by mouth daily. 180 tablet 3  . atorvastatin (LIPITOR) 10 MG tablet Take 1 tablet (10 mg total) by mouth daily at 6 PM. 90 tablet 3  . furosemide (LASIX) 40 MG tablet TAKE 1 TABLET (40 MG TOTAL) BY MOUTH DAILY. (Patient taking differently: Take 40 mg by mouth daily. ) 90 tablet 3  . Glucosamine-Chondroitin (MOVE FREE PO) Take 1 tablet by mouth 2 (two) times daily.     . isosorbide mononitrate (IMDUR) 30 MG 24 hr tablet Take 1 tablet (30 mg total) by mouth daily. 30 tablet 1  . metoprolol tartrate (LOPRESSOR) 25 MG tablet Take 25 mg in the am and Take  50  mg in the pm (Patient taking differently: Take 25-50 mg by mouth See admin instructions. Take 25 mg in the am and take 50 mg in the pm) 90 tablet 11  . Multiple Vitamins-Minerals (PRESERVISION AREDS 2 PO) Take 1 capsule by mouth 2 (two) times daily.     . Omega-3 Fatty Acids (FISH OIL) 1000 MG CAPS Take 1,000 mg by mouth daily.    Carlena Hurl 20 MG TABS tablet TAKE 1 TABLET (20 MG TOTAL) BY MOUTH DAILY WITH SUPPER. (Patient taking differently: Take 20 mg by mouth daily with supper. ) 30 tablet 11  . nitroGLYCERIN (NITROSTAT) 0.4 MG SL tablet Place 1 tablet (0.4 mg total) under the tongue every 5 (five) minutes as needed for chest pain. 25 tablet 5   No  current facility-administered medications for this visit.     Allergies:   Penicillins and Hydromorphone hcl    Social History:  The patient  reports that he quit smoking about 44 years ago. His smoking use included cigarettes. He has a 26.00 pack-year smoking history. He has never used smokeless tobacco. He reports that he does not drink alcohol or use drugs.   Family History:  The patient's family history includes CAD (age of onset: 53) in his brother; Cancer in his unknown relative; Heart disease in his unknown relative; Stroke in his brother.  ROS:  Please see the history of present illness.   Otherwise, review of systems are positive for fatigue.   All other systems are reviewed and negative.    PHYSICAL EXAM: VS:  BP 104/66   Pulse 79   Ht 5\' 8"  (1.727 m)   Wt 184 lb (83.5 kg)   SpO2 90%   BMI 27.98 kg/m  , BMI Body mass index is 27.98 kg/m. GEN: Well nourished, well developed, in no acute distress  HEENT: normal  Neck: no JVD, carotid bruits, or masses Cardiac: RRR, premature beats; no murmurs, rubs, or gallops,no edema  Respiratory:  clear to auscultation bilaterally, normal work of breathing GI: soft, nontender, nondistended, + BS MS: no deformity or atrophy  Skin: warm and dry, no rash Neuro:  Strength and sensation are intact Psych: euthymic mood, full affect   EKG:   The ekg ordered today demonstrates NSR, PVCs, paced beats   Recent Labs: 12/26/2017: ALT 14 09/27/2018: B Natriuretic Peptide 539.4 10/08/2018: BUN 18; Creatinine, Ser 1.17; Hemoglobin 12.8; Platelets 392; Potassium 4.3; Sodium 138   Lipid Panel    Component Value Date/Time   CHOL 99 09/28/2018 0251   CHOL 108 11/17/2017 0848   TRIG 28 09/28/2018 0251   HDL 47 09/28/2018 0251   HDL 56 11/17/2017 0848   CHOLHDL 2.1 09/28/2018 0251   VLDL 6 09/28/2018 0251   LDLCALC 46 09/28/2018 0251   LDLCALC 39 11/17/2017 0848     Other studies Reviewed: Additional studies/ records that were  reviewed today with results demonstrating: Normal TSH in 2016.   ASSESSMENT AND PLAN:  1. CAD: No angina.   COntinue aggressive secondary prevention.  2. AFib: s/p DCCV. Amio started. Will decrease Amio to 200 mg daily in one week.  COntinue 400 mg daily until then.  Fatigue could also be related to Amio.  WIll need TSH and LFTs folowed.  3. Cardiomyopathy: Appears euvolemic.  4. Anticoagulated: Tolerating Xarelto.    Current medicines are reviewed at length with the patient today.  The patient concerns regarding his medicines were addressed.  The following changes have been made:  No change  Labs/ tests ordered today include:   Orders Placed This Encounter  Procedures  . EKG 12-Lead    Recommend 150 minutes/week of aerobic exercise Low fat, low carb, high fiber diet recommended  Disposition:   FU in 1 month   Signed, Lance Muss, MD  10/18/2018 12:29 PM    [facility] [address] [place], [place], [place]  [address] Phone: [phone]; Fax: [phone]

## 2018-10-18 ENCOUNTER — Ambulatory Visit (INDEPENDENT_AMBULATORY_CARE_PROVIDER_SITE_OTHER): Payer: Medicare Other | Admitting: Interventional Cardiology

## 2018-10-18 ENCOUNTER — Encounter: Payer: Self-pay | Admitting: Interventional Cardiology

## 2018-10-18 VITALS — BP 104/66 | HR 79 | Ht 68.0 in | Wt 184.0 lb

## 2018-10-18 DIAGNOSIS — Z7901 Long term (current) use of anticoagulants: Secondary | ICD-10-CM | POA: Diagnosis not present

## 2018-10-18 DIAGNOSIS — I429 Cardiomyopathy, unspecified: Secondary | ICD-10-CM

## 2018-10-18 DIAGNOSIS — I4821 Permanent atrial fibrillation: Secondary | ICD-10-CM | POA: Diagnosis not present

## 2018-10-18 DIAGNOSIS — I2510 Atherosclerotic heart disease of native coronary artery without angina pectoris: Secondary | ICD-10-CM | POA: Diagnosis not present

## 2018-10-18 NOTE — Patient Instructions (Signed)
Medication Instructions:  Your physician recommends that you continue on your current medications as directed. Please refer to the Current Medication list given to you today.  If you need a refill on your cardiac medications before your next appointment, please call your pharmacy.   Lab work: None Ordered  If you have labs (blood work) drawn today and your tests are completely normal, you will receive your results only by: Marland Kitchen MyChart Message (if you have MyChart) OR . A paper copy in the mail If you have any lab test that is abnormal or we need to change your treatment, we will call you to review the results.  Testing/Procedures: None ordered  Follow-Up: . Follow up with Dr. Eldridge Dace on 11/30/18 at 11:40 AM  Any Other Special Instructions Will Be Listed Below (If Applicable).

## 2018-11-01 ENCOUNTER — Ambulatory Visit (INDEPENDENT_AMBULATORY_CARE_PROVIDER_SITE_OTHER): Payer: Medicare Other

## 2018-11-01 DIAGNOSIS — I441 Atrioventricular block, second degree: Secondary | ICD-10-CM

## 2018-11-01 LAB — CUP PACEART REMOTE DEVICE CHECK
Battery Impedance: 899 Ohm
Battery Remaining Longevity: 68 mo
Battery Voltage: 2.77 V
Brady Statistic AP VP Percent: 10 %
Brady Statistic AP VS Percent: 25 %
Brady Statistic AS VP Percent: 17 %
Brady Statistic AS VS Percent: 48 %
Date Time Interrogation Session: 20200102191435
Implantable Lead Implant Date: 20111206
Implantable Lead Implant Date: 20111206
Implantable Lead Location: 753859
Implantable Lead Location: 753860
Implantable Lead Model: 5076
Implantable Lead Model: 5092
Implantable Pulse Generator Implant Date: 20111206
Lead Channel Impedance Value: 762 Ohm
Lead Channel Pacing Threshold Amplitude: 1.25 V
Lead Channel Pacing Threshold Pulse Width: 0.4 ms
Lead Channel Pacing Threshold Pulse Width: 0.4 ms
Lead Channel Setting Pacing Amplitude: 2.5 V
Lead Channel Setting Pacing Amplitude: 2.5 V
Lead Channel Setting Pacing Pulse Width: 0.4 ms
Lead Channel Setting Sensing Sensitivity: 5.6 mV
MDC IDC MSMT LEADCHNL RA IMPEDANCE VALUE: 442 Ohm
MDC IDC MSMT LEADCHNL RV PACING THRESHOLD AMPLITUDE: 0.75 V

## 2018-11-02 NOTE — Progress Notes (Signed)
Remote pacemaker transmission.   

## 2018-11-06 ENCOUNTER — Encounter: Payer: Self-pay | Admitting: Nurse Practitioner

## 2018-11-07 ENCOUNTER — Encounter: Payer: Self-pay | Admitting: Cardiology

## 2018-11-07 ENCOUNTER — Ambulatory Visit (INDEPENDENT_AMBULATORY_CARE_PROVIDER_SITE_OTHER): Payer: Medicare Other | Admitting: Cardiology

## 2018-11-07 VITALS — BP 112/60 | HR 66 | Ht 68.0 in | Wt 186.8 lb

## 2018-11-07 DIAGNOSIS — I480 Paroxysmal atrial fibrillation: Secondary | ICD-10-CM

## 2018-11-07 DIAGNOSIS — I25118 Atherosclerotic heart disease of native coronary artery with other forms of angina pectoris: Secondary | ICD-10-CM | POA: Diagnosis not present

## 2018-11-07 DIAGNOSIS — E785 Hyperlipidemia, unspecified: Secondary | ICD-10-CM

## 2018-11-07 DIAGNOSIS — R5383 Other fatigue: Secondary | ICD-10-CM | POA: Insufficient documentation

## 2018-11-07 DIAGNOSIS — Z9989 Dependence on other enabling machines and devices: Secondary | ICD-10-CM

## 2018-11-07 DIAGNOSIS — G4733 Obstructive sleep apnea (adult) (pediatric): Secondary | ICD-10-CM

## 2018-11-07 DIAGNOSIS — Z950 Presence of cardiac pacemaker: Secondary | ICD-10-CM

## 2018-11-07 DIAGNOSIS — I519 Heart disease, unspecified: Secondary | ICD-10-CM | POA: Diagnosis not present

## 2018-11-07 MED ORDER — METOPROLOL SUCCINATE ER 50 MG PO TB24: 50.0000 mg | ORAL_TABLET | Freq: Every day | ORAL | 3 refills | Status: DC

## 2018-11-07 MED ORDER — FUROSEMIDE 20 MG PO TABS: 20.0000 mg | ORAL_TABLET | Freq: Every day | ORAL | 3 refills | Status: DC

## 2018-11-07 MED ORDER — SACUBITRIL-VALSARTAN 24-26 MG PO TABS: 1.0000 | ORAL_TABLET | Freq: Two times a day (BID) | ORAL | 11 refills | Status: DC

## 2018-11-07 NOTE — Patient Instructions (Signed)
Medication Instructions:  Your physician has recommended you make the following change in your medication:   1. STOP: metoprolol tartrate (lopressor)  2. START: metoprolol succinate (Toprol-XL) 50 mg: Take 1 tablet by mouth once a day  3. DECREASE: furosemide (lasix) to 20 mg once a day  4. START: entresto 24-26 mg by mouth twice a day  If you need a refill on your cardiac medications before your next appointment, please call your pharmacy.   Lab work: TODAY: TSH, LFTS, CK  Your physician recommends that you return for lab work in: 1 week for BMET  If you have labs (blood work) drawn today and your tests are completely normal, you will receive your results only by: Marland Kitchen MyChart Message (if you have MyChart) OR . A paper copy in the mail If you have any lab test that is abnormal or we need to change your treatment, we will call you to review the results.  Testing/Procedures: None ordered  Follow-Up: . Follow up with Gregory Spies, PA on 11/20/18 at 3:00 PM  Any Other Special Instructions Will Be Listed Below (If Applicable).  Be sure to use your CPAP   DASH Eating Plan DASH stands for "Dietary Approaches to Stop Hypertension." The DASH eating plan is a healthy eating plan that has been shown to reduce high blood pressure (hypertension). It may also reduce your risk for type 2 diabetes, heart disease, and stroke. The DASH eating plan may also help with weight loss. What are tips for following this plan?  General guidelines  Avoid eating more than 2,300 mg (milligrams) of salt (sodium) a day. If you have hypertension, you may need to reduce your sodium intake to 1,500 mg a day.  Limit alcohol intake to no more than 1 drink a day for nonpregnant women and 2 drinks a day for men. One drink equals 12 oz of beer, 5 oz of wine, or 1 oz of hard liquor.  Work with your health care provider to maintain a healthy body weight or to lose weight. Ask what an ideal weight is for you.  Get at  least 30 minutes of exercise that causes your heart to beat faster (aerobic exercise) most days of the week. Activities may include walking, swimming, or biking.  Work with your health care provider or diet and nutrition specialist (dietitian) to adjust your eating plan to your individual calorie needs. Reading food labels   Check food labels for the amount of sodium per serving. Choose foods with less than 5 percent of the Daily Value of sodium. Generally, foods with less than 300 mg of sodium per serving fit into this eating plan.  To find whole grains, look for the word "whole" as the first word in the ingredient list. Shopping  Buy products labeled as "low-sodium" or "no salt added."  Buy fresh foods. Avoid canned foods and premade or frozen meals. Cooking  Avoid adding salt when cooking. Use salt-free seasonings or herbs instead of table salt or sea salt. Check with your health care provider or pharmacist before using salt substitutes.  Do not fry foods. Cook foods using healthy methods such as baking, boiling, grilling, and broiling instead.  Cook with heart-healthy oils, such as olive, canola, soybean, or sunflower oil. Meal planning  Eat a balanced diet that includes: ? 5 or more servings of fruits and vegetables each day. At each meal, try to fill half of your plate with fruits and vegetables. ? Up to 6-8 servings of whole grains  each day. ? Less than 6 oz of lean meat, poultry, or fish each day. A 3-oz serving of meat is about the same size as a deck of cards. One egg equals 1 oz. ? 2 servings of low-fat dairy each day. ? A serving of nuts, seeds, or beans 5 times each week. ? Heart-healthy fats. Healthy fats called Omega-3 fatty acids are found in foods such as flaxseeds and coldwater fish, like sardines, salmon, and mackerel.  Limit how much you eat of the following: ? Canned or prepackaged foods. ? Food that is high in trans fat, such as fried foods. ? Food that is high  in saturated fat, such as fatty meat. ? Sweets, desserts, sugary drinks, and other foods with added sugar. ? Full-fat dairy products.  Do not salt foods before eating.  Try to eat at least 2 vegetarian meals each week.  Eat more home-cooked food and less restaurant, buffet, and fast food.  When eating at a restaurant, ask that your food be prepared with less salt or no salt, if possible. What foods are recommended? The items listed may not be a complete list. Talk with your dietitian about what dietary choices are best for you. Grains Whole-grain or whole-wheat bread. Whole-grain or whole-wheat pasta. Brown rice. Orpah Cobb. Bulgur. Whole-grain and low-sodium cereals. Pita bread. Low-fat, low-sodium crackers. Whole-wheat flour tortillas. Vegetables Fresh or frozen vegetables (raw, steamed, roasted, or grilled). Low-sodium or reduced-sodium tomato and vegetable juice. Low-sodium or reduced-sodium tomato sauce and tomato paste. Low-sodium or reduced-sodium canned vegetables. Fruits All fresh, dried, or frozen fruit. Canned fruit in natural juice (without added sugar). Meat and other protein foods Skinless chicken or [place]. Ground chicken or [place]. Pork with fat trimmed off. Fish and seafood. Egg whites. Dried beans, peas, or lentils. Unsalted nuts, nut butters, and seeds. Unsalted canned beans. Lean cuts of beef with fat trimmed off. Low-sodium, lean deli meat. Dairy Low-fat (1%) or fat-free (skim) milk. Fat-free, low-fat, or reduced-fat cheeses. Nonfat, low-sodium ricotta or cottage cheese. Low-fat or nonfat yogurt. Low-fat, low-sodium cheese. Fats and oils Soft margarine without trans fats. Vegetable oil. Low-fat, reduced-fat, or light mayonnaise and salad dressings (reduced-sodium). Canola, safflower, olive, soybean, and sunflower oils. Avocado. Seasoning and other foods Herbs. Spices. Seasoning mixes without salt. Unsalted popcorn and pretzels. Fat-free sweets. What foods are not  recommended? The items listed may not be a complete list. Talk with your dietitian about what dietary choices are best for you. Grains Baked goods made with fat, such as croissants, muffins, or some breads. Dry pasta or rice meal packs. Vegetables Creamed or fried vegetables. Vegetables in a cheese sauce. Regular canned vegetables (not low-sodium or reduced-sodium). Regular canned tomato sauce and paste (not low-sodium or reduced-sodium). Regular tomato and vegetable juice (not low-sodium or reduced-sodium). Rosita Fire. Olives. Fruits Canned fruit in a light or heavy syrup. Fried fruit. Fruit in cream or butter sauce. Meat and other protein foods Fatty cuts of meat. Ribs. Fried meat. Tomasa Blase. Sausage. Bologna and other processed lunch meats. Salami. Fatback. Hotdogs. Bratwurst. Salted nuts and seeds. Canned beans with added salt. Canned or smoked fish. Whole eggs or egg yolks. Chicken or [place] with skin. Dairy Whole or 2% milk, cream, and half-and-half. Whole or full-fat cream cheese. Whole-fat or sweetened yogurt. Full-fat cheese. Nondairy creamers. Whipped toppings. Processed cheese and cheese spreads. Fats and oils Butter. Stick margarine. Lard. Shortening. Ghee. Bacon fat. Tropical oils, such as coconut, palm kernel, or palm oil. Seasoning and other foods Salted popcorn and  pretzels. Onion salt, garlic salt, seasoned salt, table salt, and sea salt. Worcestershire sauce. Tartar sauce. Barbecue sauce. Teriyaki sauce. Soy sauce, including reduced-sodium. Steak sauce. Canned and packaged gravies. Fish sauce. Oyster sauce. Cocktail sauce. Horseradish that you find on the shelf. Ketchup. Mustard. Meat flavorings and tenderizers. Bouillon cubes. Hot sauce and Tabasco sauce. Premade or packaged marinades. Premade or packaged taco seasonings. Relishes. Regular salad dressings. Where to find more information:  National Heart, Lung, and Blood Institute: PopSteam.is  American Heart Association:  www.heart.org Summary  The DASH eating plan is a healthy eating plan that has been shown to reduce high blood pressure (hypertension). It may also reduce your risk for type 2 diabetes, heart disease, and stroke.  With the DASH eating plan, you should limit salt (sodium) intake to 2,300 mg a day. If you have hypertension, you may need to reduce your sodium intake to 1,500 mg a day.  When on the DASH eating plan, aim to eat more fresh fruits and vegetables, whole grains, lean proteins, low-fat dairy, and heart-healthy fats.  Work with your health care provider or diet and nutrition specialist (dietitian) to adjust your eating plan to your individual calorie needs. This information is not intended to replace advice given to you by your health care provider. Make sure you discuss any questions you have with your health care provider. Document Released: 10/06/2011 Document Revised: 10/10/2016 Document Reviewed: 10/10/2016 Elsevier Interactive Patient Education  2019 [employer].

## 2018-11-07 NOTE — Progress Notes (Addendum)
**Note Gregory-Identified via Obfuscation** Cardiology Office Note:    Date:  11/07/2018   ID:  DEKENDRICK Mccormick, DOB [birth year], MRN [id number]  PCP:  Tally Joe, MD  Cardiologist:  Lance Muss, MD  Referring MD: Tally Joe, MD   Chief Complaint  Patient presents with  . Fatigue    History of Present Illness:    Gregory Mccormick is a 85 y.o. male with a past medical history significant for CAD s/p stents to LAD and circumflex in 2014, PAF on Xarelto, Medtronic permanent pacemaker for second-degree heart block, OSA no longer using CPAP, hypertension, hyperlipidemia, cardiomyopathy and hiatal hernia.  He was hospitalized in 10/2017 with atypical chest pain.  He ruled out for he was treated with medical therapy EF was moderately decreased by echo.  He was maintaining sinus rhythm with PVCs.  After hospital discharge the patient began to notice increased fatigue.  He was seen back in the office on 10/08/2018 and found to be in atrial fibrillation with RVR.  He was started on amiodarone and underwent successful electric cardioversion on 10/09/2018 to sinus rhythm in the 70s.  Per Dr.Varanasi, Gregory Mccormick is to be continued on amiodarone as he is quite symptomatic with A. Fib.   [name] is here today with his son with complaints of continued fatigue. He gets short of breath walking his dog a short distance and he has pains like needles sticking in his bilateral thighs when he stands up straight for the last 7-10 days. Now he is sleeping poorly with only 2-4 hours of sleep per night.   He has lower leg edema R>L but no orthopnea or PND. He has not been using his CPAP since he had a cold early last year.    Past Medical History:  Diagnosis Date  . Anemia, unspecified   . Arthritis    "right knee" (03/28/2013  . CAD in native artery    takes Xarelto daily  . Cardiomyopathy (HCC)    EF 40%:  ECHO 11/25/2015 EF 55%-60%  . Carpal tunnel syndrome   . Carpal tunnel syndrome, bilateral   . Epistaxis   . Essential hypertension,  benign    takes Metoprolol daily  . Exertional shortness of breath    "recently" (03/28/2013)  . H/O hiatal hernia   . History of atrial fibrillation   . History of kidney stones   . Hyperlipidemia    takes Lipitor daily  . Joint pain   . Nocturia   . Nonspecific abnormal unspecified cardiovascular function study   . OSA on CPAP    wears CPAP 02/03/16  . Other primary cardiomyopathies   . Pacemaker    MDT  . Paroxysmal ventricular tachycardia (HCC)   . Peripheral edema    takes Lasix daily  . PONV (postoperative nausea and vomiting)   . Vitamin D deficiency     Past Surgical History:  Procedure Laterality Date  . CARDIAC CATHETERIZATION  03/21/2013  . CARDIOVERSION N/A 10/09/2018   Procedure: CARDIOVERSION;  Surgeon: Wendall Stade, MD;  Location: [facility] ENDOSCOPY;  Service: Cardiovascular;  Laterality: N/A;  . CARPAL TUNNEL RELEASE Right 02/05/2016   Procedure: RIGHT LIMITED OPEN CARPAL TUNNEL RELEASE;  Surgeon: Dominica Severin, MD;  Location: [facility] OR;  Service: Orthopedics;  Laterality: Right;  . CARPAL TUNNEL RELEASE Left 04/21/2016   Procedure: CARPAL TUNNEL RELEASE;  Surgeon: Dominica Severin, MD;  Location: [facility] OR;  Service: Orthopedics;  Laterality: Left;  . CATARACT EXTRACTION W/ INTRAOCULAR LENS  IMPLANT, BILATERAL Bilateral 2000's  .  COLONOSCOPY    . CORONARY ANGIOPLASTY WITH STENT PLACEMENT  03/28/2013   "3 stents" (03/28/2013)  . INSERT / REPLACE / REMOVE PACEMAKER    . KNEE CARTILAGE SURGERY Left 1972   "cut it open" (03/28/2013)  . KNEE CARTILAGE SURGERY Right ~ 1977   "cut it open" (03/28/2013)  . PACEMAKER INSERTION  10/05/10   MDT Adapta L implanted by Dr [name] for mobitz II second degree AV block  . PERCUTANEOUS CORONARY ROTOBLATOR INTERVENTION (PCI-R) N/A 03/28/2013   Procedure: PERCUTANEOUS CORONARY ROTOBLATOR INTERVENTION (PCI-R);  Surgeon: Corky Crafts, MD;  Location: [facility] CATH LAB;  Service: Cardiovascular;  Laterality: N/A;  . SEPTOPLASTY N/A 02/05/2016    Procedure: SEPTOPLASTY WITH INTRANASAL SKIN GRAFT;  Surgeon: Drema Halon, MD;  Location: [facility] OR;  Service: ENT;  Laterality: N/A;  . SHOULDER HEMI-ARTHROPLASTY Right 1987   "got hit by sled & dragged down sheet > 200 feet; tore shoulder up" (03/28/2013)  . SHOULDER SURGERY Right 1970's   "pulled muscle apart; sewed it back together" (03/28/2013  . TOTAL KNEE ARTHROPLASTY Right 2007  . TOTAL KNEE ARTHROPLASTY  11/26/2012   Procedure: TOTAL KNEE ARTHROPLASTY;  Surgeon: Loanne Drilling, MD;  Location: [facility] ORS;  Service: Orthopedics;  Laterality: Left;  . TRANSURETHRAL RESECTION OF PROSTATE N/A 12/27/2017   Procedure: TRANSURETHRAL RESECTION OF THE PROSTATE (TURP);  Surgeon: Crista Elliot, MD;  Location: [facility];  Service: Urology;  Laterality: N/A;    Current Medications: Current Meds  Medication Sig  . acetaminophen (TYLENOL) 500 MG tablet Take 1,000 mg by mouth daily as needed for mild pain or headache.   Marland Kitchen amiodarone (PACERONE) 200 MG tablet Take 2 tablets (400 mg total) by mouth daily.  Marland Kitchen atorvastatin (LIPITOR) 10 MG tablet Take 1 tablet (10 mg total) by mouth daily at 6 PM.  . furosemide (LASIX) 20 MG tablet Take 1 tablet (20 mg total) by mouth daily.  . Glucosamine-Chondroitin (MOVE FREE PO) Take 1 tablet by mouth 2 (two) times daily.   . isosorbide mononitrate (IMDUR) 30 MG 24 hr tablet Take 1 tablet (30 mg total) by mouth daily.  . Multiple Vitamins-Minerals (PRESERVISION AREDS 2 PO) Take 1 capsule by mouth 2 (two) times daily.   . Omega-3 Fatty Acids (FISH OIL) 1000 MG CAPS Take 1,000 mg by mouth daily.  Carlena Hurl 20 MG TABS tablet TAKE 1 TABLET (20 MG TOTAL) BY MOUTH DAILY WITH SUPPER. (Patient taking differently: Take 20 mg by mouth daily with supper. )  . [DISCONTINUED] furosemide (LASIX) 40 MG tablet TAKE 1 TABLET (40 MG TOTAL) BY MOUTH DAILY. (Patient taking differently: Take 40 mg by mouth daily. )  . [DISCONTINUED] metoprolol tartrate (LOPRESSOR) 25 MG  tablet Take 25 mg in the am and Take  50  mg in the pm (Patient taking differently: Take 25-50 mg by mouth See admin instructions. Take 25 mg in the am and take 50 mg in the pm)     Allergies:   Penicillins and Hydromorphone hcl   Social History   Socioeconomic History  . Marital status: Married    Spouse name: Not on file  . Number of children: Not on file  . Years of education: Not on file  . Highest education level: Not on file  Occupational History  . Occupation: retired    Associate Professor: [employer]  Social Needs  . Financial resource strain: Not on file  . Food insecurity:    Worry: Not on file  Inability: Not on file  . Transportation needs:    Medical: Not on file    Non-medical: Not on file  Tobacco Use  . Smoking status: Former Smoker    Packs/day: 1.00    Years: 26.00    Pack years: 26.00    Types: Cigarettes    Last attempt to quit: 10/31/1973    Years since quitting: 45.0  . Smokeless tobacco: Never Used  Substance and Sexual Activity  . Alcohol use: No  . Drug use: No  . Sexual activity: Never  Lifestyle  . Physical activity:    Days per week: Not on file    Minutes per session: Not on file  . Stress: Not on file  Relationships  . Social connections:    Talks on phone: Not on file    Gets together: Not on file    Attends religious service: Not on file    Active member of club or organization: Not on file    Attends meetings of clubs or organizations: Not on file    Relationship status: Not on file  Other Topics Concern  . Not on file  Social History Narrative  . Not on file     Family History: The patient's family history includes CAD (age of onset: 4375) in his brother; Cancer in an other family member; Heart disease in an other family member; Stroke in his brother. There is no history of Heart attack or Hypertension. ROS:   Please see the history of present illness.     All other systems reviewed and are negative.  EKGs/Labs/Other Studies  Reviewed:    The following studies were reviewed today:  Echocardiogram 09/28/2018 Study Conclusions - Left ventricle: The cavity size was normal. Wall thickness was   increased in a pattern of mild LVH. Systolic function was   moderately to severely reduced. The estimated ejection fraction   was in the range of 30% to 35%. Diffuse hypokinesis. The study is   not technically sufficient to allow evaluation of LV diastolic   function. LV filling pressure is elevated. - Aortic valve: Calcified leaflets. Moderate stenosis. Mean   gradient (S): 22 mm Hg. Valve area (VTI): 1.35 cm^2. Valve area   (Vmean): 1.5 cm^2. - Mitral valve: Mildly thickened leaflets . There was mild   regurgitation. - Left atrium: Moderately dilated. - Right ventricle: The cavity size was mildly dilated. - Right atrium: Severely dilated. - Tricuspid valve: There was mild regurgitation. - Pulmonary arteries: PA peak pressure: 46 mm Hg (S). - Inferior vena cava: The vessel was dilated. The respirophasic   diameter changes were blunted (< 50%), consistent with elevated   central venous pressure.  Impressions: - Compared to a prior study in 2017, the LVEF is lower at 30-35%.   There is now moderate aortic stenosis with a mean gradient of 22   mmHg.   EKG:  EKG is ordered today.  The ekg ordered today demonstrates  AV pacing, sinus. 66 bpm  Recent Labs: 12/26/2017: ALT 14 09/27/2018: B Natriuretic Peptide 539.4 10/08/2018: BUN 18; Creatinine, Ser 1.17; Hemoglobin 12.8; Platelets 392; Potassium 4.3; Sodium 138   Recent Lipid Panel    Component Value Date/Time   CHOL 99 09/28/2018 0251   CHOL 108 11/17/2017 0848   TRIG 28 09/28/2018 0251   HDL 47 09/28/2018 0251   HDL 56 11/17/2017 0848   CHOLHDL 2.1 09/28/2018 0251   VLDL 6 09/28/2018 0251   LDLCALC 46 09/28/2018 0251   LDLCALC 39  11/17/2017 0848    Physical Exam:    VS:  BP 112/60   Pulse 66   Ht 5\' 8"  (1.727 m)   Wt 186 lb 12.8 oz (84.7 kg)    BMI 28.40 kg/m     Wt Readings from Last 3 Encounters:  11/07/18 186 lb 12.8 oz (84.7 kg)  10/18/18 184 lb (83.5 kg)  10/08/18 188 lb 9.6 oz (85.5 kg)     Physical Exam  Constitutional: He is oriented to person, place, and time. He appears well-developed and well-nourished. No distress.  HENT:  Head: Normocephalic and atraumatic.  Neck: Normal range of motion. Neck supple. No JVD present.  Cardiovascular: Normal rate, regular rhythm, normal heart sounds and intact distal pulses. Exam reveals no gallop and no friction rub.  No murmur heard. Pulmonary/Chest: Effort normal and breath sounds normal. No respiratory distress. He has no wheezes. He has no rales.  Abdominal: Soft. Bowel sounds are normal.  Musculoskeletal:        General: Edema present.     Comments: Trace-1+ lower leg edema, R>L  Neurological: He is alert and oriented to person, place, and time.  Skin: Skin is warm and dry.  Psychiatric: He has a normal mood and affect. His behavior is normal. Judgment and thought content normal.  Vitals reviewed.   ASSESSMENT:    1. Paroxysmal atrial fibrillation (HCC)   2. Fatigue, unspecified type   3. Atherosclerosis of native coronary artery of native heart with other form of angina pectoris (HCC)   4. Chronic systolic dysfunction of left ventricle   5. Hyperlipidemia, unspecified hyperlipidemia type   6. Presence of cardiac pacemaker   7. OSA on CPAP    PLAN:    In order of problems listed above:  1.  Fatigue: Not improved with conversion from afib to sinus rhythm. May be related to reduced LV function or perhaps related to amiodarone.    2. Cardiomyopathy: EF recently down from 55-60% in 2017 to 30-35% in 08/2018 unclear if ischemic or tachyarrhythmia, likely a combination. Continues on Lasix 40 mg daily. Pt has DOE, activity intolerance and lower leg edema. Will initiate low dose Entresto and reduce lasix and metoprolol to make room in BP. Also change to Toprol XL.  I had  a long discussion with patient and his son about rationale for Entresto and that it may lower BP but have long term benefits on heart function.  Advised on safety precautions with rising slowly and moving with care. He will also ensure adequate fluid intake with 6-8 cups per day.  Will follow up renal function/K+ in a week. Close office follow up for tolerance to med changes.   2.  Atrial fibrillation: S/p DCCV on 10/09/2018.  He continues on amiodarone, now 200 mg daily.  Will check TSH and LFTs. He continues on Xarelto for stroke risk reduction If fatigue and activity tolerance do not improve with the above changes, can consider reducing amiodarone.   3. CAD: Status post stent to LAD and circumflex 2014.  Medically treated with beta-blocker, statin, Imdur.  Not on aspirin due to need for sedation. No chest discomfort.  4. Hyperlipidemia: On atorvastatin 10 mg daily and omega-3 fatty acids.  LDL was 46 on 09/28/2018 just at goal of <70.  Pt with c/o bilateral leg pain. He is not new to statin but may be cross reacting with amiodarone. Will check LFTs and CK.  5. Permanent pacemaker: Medtronic followed by Dr. [name] and the device clinic  6. OSA on CPAP: Pt has not been using for about a year after stopping due to a cold. Advised to restart use as this will help in maintaining sinus rhythm.    Medication Adjustments/Labs and Tests Ordered: Current medicines are reviewed at length with the patient today.  Concerns regarding medicines are outlined above. Labs and tests ordered and medication changes are outlined in the patient instructions below:  Patient Instructions  Medication Instructions:  Your physician has recommended you make the following change in your medication:   1. STOP: metoprolol tartrate (lopressor)  2. START: metoprolol succinate (Toprol-XL) 50 mg: Take 1 tablet by mouth once a day  3. DECREASE: furosemide (lasix) to 20 mg once a day  4. START: entresto 24-26 mg by mouth  twice a day  If you need a refill on your cardiac medications before your next appointment, please call your pharmacy.   Lab work: TODAY: TSH, LFTS, CK  Your physician recommends that you return for lab work in: 1 week for BMET  If you have labs (blood work) drawn today and your tests are completely normal, you will receive your results only by: Marland Kitchen MyChart Message (if you have MyChart) OR . A paper copy in the mail If you have any lab test that is abnormal or we need to change your treatment, we will call you to review the results.  Testing/Procedures: None ordered  Follow-Up: . Follow up with Ronie Spies, PA on 11/20/18 at 3:00 PM  Any Other Special Instructions Will Be Listed Below (If Applicable).  Be sure to use your CPAP   DASH Eating Plan DASH stands for "Dietary Approaches to Stop Hypertension." The DASH eating plan is a healthy eating plan that has been shown to reduce high blood pressure (hypertension). It may also reduce your risk for type 2 diabetes, heart disease, and stroke. The DASH eating plan may also help with weight loss. What are tips for following this plan?  General guidelines  Avoid eating more than 2,300 mg (milligrams) of salt (sodium) a day. If you have hypertension, you may need to reduce your sodium intake to 1,500 mg a day.  Limit alcohol intake to no more than 1 drink a day for nonpregnant women and 2 drinks a day for men. One drink equals 12 oz of beer, 5 oz of wine, or 1 oz of hard liquor.  Work with your health care provider to maintain a healthy body weight or to lose weight. Ask what an ideal weight is for you.  Get at least 30 minutes of exercise that causes your heart to beat faster (aerobic exercise) most days of the week. Activities may include walking, swimming, or biking.  Work with your health care provider or diet and nutrition specialist (dietitian) to adjust your eating plan to your individual calorie needs. Reading food  labels   Check food labels for the amount of sodium per serving. Choose foods with less than 5 percent of the Daily Value of sodium. Generally, foods with less than 300 mg of sodium per serving fit into this eating plan.  To find whole grains, look for the word "whole" as the first word in the ingredient list. Shopping  Buy products labeled as "low-sodium" or "no salt added."  Buy fresh foods. Avoid canned foods and premade or frozen meals. Cooking  Avoid adding salt when cooking. Use salt-free seasonings or herbs instead of table salt or sea salt. Check with your health care provider or pharmacist before using  salt substitutes.  Do not fry foods. Cook foods using healthy methods such as baking, boiling, grilling, and broiling instead.  Cook with heart-healthy oils, such as olive, canola, soybean, or sunflower oil. Meal planning  Eat a balanced diet that includes: ? 5 or more servings of fruits and vegetables each day. At each meal, try to fill half of your plate with fruits and vegetables. ? Up to 6-8 servings of whole grains each day. ? Less than 6 oz of lean meat, poultry, or fish each day. A 3-oz serving of meat is about the same size as a deck of cards. One egg equals 1 oz. ? 2 servings of low-fat dairy each day. ? A serving of nuts, seeds, or beans 5 times each week. ? Heart-healthy fats. Healthy fats called Omega-3 fatty acids are found in foods such as flaxseeds and coldwater fish, like sardines, salmon, and mackerel.  Limit how much you eat of the following: ? Canned or prepackaged foods. ? Food that is high in trans fat, such as fried foods. ? Food that is high in saturated fat, such as fatty meat. ? Sweets, desserts, sugary drinks, and other foods with added sugar. ? Full-fat dairy products.  Do not salt foods before eating.  Try to eat at least 2 vegetarian meals each week.  Eat more home-cooked food and less restaurant, buffet, and fast food.  When eating at a  restaurant, ask that your food be prepared with less salt or no salt, if possible. What foods are recommended? The items listed may not be a complete list. Talk with your dietitian about what dietary choices are best for you. Grains Whole-grain or whole-wheat bread. Whole-grain or whole-wheat pasta. Brown rice. Orpah Cobb. Bulgur. Whole-grain and low-sodium cereals. Pita bread. Low-fat, low-sodium crackers. Whole-wheat flour tortillas. Vegetables Fresh or frozen vegetables (raw, steamed, roasted, or grilled). Low-sodium or reduced-sodium tomato and vegetable juice. Low-sodium or reduced-sodium tomato sauce and tomato paste. Low-sodium or reduced-sodium canned vegetables. Fruits All fresh, dried, or frozen fruit. Canned fruit in natural juice (without added sugar). Meat and other protein foods Skinless chicken or [place]. Ground chicken or [place]. Pork with fat trimmed off. Fish and seafood. Egg whites. Dried beans, peas, or lentils. Unsalted nuts, nut butters, and seeds. Unsalted canned beans. Lean cuts of beef with fat trimmed off. Low-sodium, lean deli meat. Dairy Low-fat (1%) or fat-free (skim) milk. Fat-free, low-fat, or reduced-fat cheeses. Nonfat, low-sodium ricotta or cottage cheese. Low-fat or nonfat yogurt. Low-fat, low-sodium cheese. Fats and oils Soft margarine without trans fats. Vegetable oil. Low-fat, reduced-fat, or light mayonnaise and salad dressings (reduced-sodium). Canola, safflower, olive, soybean, and sunflower oils. Avocado. Seasoning and other foods Herbs. Spices. Seasoning mixes without salt. Unsalted popcorn and pretzels. Fat-free sweets. What foods are not recommended? The items listed may not be a complete list. Talk with your dietitian about what dietary choices are best for you. Grains Baked goods made with fat, such as croissants, muffins, or some breads. Dry pasta or rice meal packs. Vegetables Creamed or fried vegetables. Vegetables in a cheese sauce.  Regular canned vegetables (not low-sodium or reduced-sodium). Regular canned tomato sauce and paste (not low-sodium or reduced-sodium). Regular tomato and vegetable juice (not low-sodium or reduced-sodium). Rosita Fire. Olives. Fruits Canned fruit in a light or heavy syrup. Fried fruit. Fruit in cream or butter sauce. Meat and other protein foods Fatty cuts of meat. Ribs. Fried meat. Tomasa Blase. Sausage. Bologna and other processed lunch meats. Salami. Fatback. Hotdogs. Bratwurst. Salted nuts and seeds.  Canned beans with added salt. Canned or smoked fish. Whole eggs or egg yolks. Chicken or [place] with skin. Dairy Whole or 2% milk, cream, and half-and-half. Whole or full-fat cream cheese. Whole-fat or sweetened yogurt. Full-fat cheese. Nondairy creamers. Whipped toppings. Processed cheese and cheese spreads. Fats and oils Butter. Stick margarine. Lard. Shortening. Ghee. Bacon fat. Tropical oils, such as coconut, palm kernel, or palm oil. Seasoning and other foods Salted popcorn and pretzels. Onion salt, garlic salt, seasoned salt, table salt, and sea salt. Worcestershire sauce. Tartar sauce. Barbecue sauce. Teriyaki sauce. Soy sauce, including reduced-sodium. Steak sauce. Canned and packaged gravies. Fish sauce. Oyster sauce. Cocktail sauce. Horseradish that you find on the shelf. Ketchup. Mustard. Meat flavorings and tenderizers. Bouillon cubes. Hot sauce and Tabasco sauce. Premade or packaged marinades. Premade or packaged taco seasonings. Relishes. Regular salad dressings. Where to find more information:  National Heart, Lung, and Blood Institute: PopSteam.iswww.nhlbi.nih.gov  American Heart Association: www.heart.org Summary  The DASH eating plan is a healthy eating plan that has been shown to reduce high blood pressure (hypertension). It may also reduce your risk for type 2 diabetes, heart disease, and stroke.  With the DASH eating plan, you should limit salt (sodium) intake to 2,300 mg a day. If you have  hypertension, you may need to reduce your sodium intake to 1,500 mg a day.  When on the DASH eating plan, aim to eat more fresh fruits and vegetables, whole grains, lean proteins, low-fat dairy, and heart-healthy fats.  Work with your health care provider or diet and nutrition specialist (dietitian) to adjust your eating plan to your individual calorie needs. This information is not intended to replace advice given to you by your health care provider. Make sure you discuss any questions you have with your health care provider. Document Released: 10/06/2011 Document Revised: 10/10/2016 Document Reviewed: 10/10/2016 Elsevier Interactive Patient Education  [address]2019 Elsevier Inc.       Signed, Berton BonJanine Sandar Krinke, NP  11/07/2018 1:37 PM    [facility]

## 2018-11-08 ENCOUNTER — Telehealth: Payer: Self-pay

## 2018-11-08 ENCOUNTER — Telehealth: Payer: Self-pay | Admitting: Cardiology

## 2018-11-08 LAB — HEPATIC FUNCTION PANEL
ALT: 18 IU/L (ref 0–44)
AST: 19 IU/L (ref 0–40)
Albumin: 4 g/dL (ref 3.5–4.7)
Alkaline Phosphatase: 104 IU/L (ref 39–117)
Bilirubin Total: 0.7 mg/dL (ref 0.0–1.2)
Bilirubin, Direct: 0.28 mg/dL (ref 0.00–0.40)
Total Protein: 6.9 g/dL (ref 6.0–8.5)

## 2018-11-08 LAB — TSH: TSH: 8.69 u[IU]/mL — AB (ref 0.450–4.500)

## 2018-11-08 LAB — CK: Total CK: 49 U/L (ref 24–204)

## 2018-11-08 MED ORDER — AMIODARONE HCL 200 MG PO TABS: 200.0000 mg | ORAL_TABLET | Freq: Every day | ORAL | 3 refills | Status: DC

## 2018-11-08 NOTE — Telephone Encounter (Signed)
Pt is taking Amiodarone 200 mg daily

## 2018-11-08 NOTE — Telephone Encounter (Signed)
I called pt to inform him of his elevated TSH and need to follow up with PCP. Results sent to PCP. Pt states that he implemented med changes that we discussed and he is feeling better today. He is not fatigued like he was yesterday. No adverse effects noted.

## 2018-11-08 NOTE — Progress Notes (Signed)
Patient is already taking amio 200 mg QD. Discussed with Lizabeth Leyden, NP and patient will continue at this dose.

## 2018-11-11 ENCOUNTER — Telehealth: Payer: Self-pay | Admitting: Cardiology

## 2018-11-11 NOTE — Telephone Encounter (Signed)
I called to check on how pt is doing on the [place]. He says that he is feeling much better, no longer having shortness of breath and pedal edema is resolved. He has some mild lightheadedness if rises too quickly, otherwise he is tolerating it well. I advised that he can now take lasix only as needed for swelling. He appreciated the call and understands the plan.

## 2018-11-13 ENCOUNTER — Other Ambulatory Visit: Payer: Medicare Other

## 2018-11-13 DIAGNOSIS — I519 Heart disease, unspecified: Secondary | ICD-10-CM

## 2018-11-13 DIAGNOSIS — R5383 Other fatigue: Secondary | ICD-10-CM

## 2018-11-13 DIAGNOSIS — G4733 Obstructive sleep apnea (adult) (pediatric): Secondary | ICD-10-CM

## 2018-11-13 DIAGNOSIS — E785 Hyperlipidemia, unspecified: Secondary | ICD-10-CM

## 2018-11-13 DIAGNOSIS — I25118 Atherosclerotic heart disease of native coronary artery with other forms of angina pectoris: Secondary | ICD-10-CM

## 2018-11-13 DIAGNOSIS — Z9989 Dependence on other enabling machines and devices: Secondary | ICD-10-CM

## 2018-11-13 DIAGNOSIS — I480 Paroxysmal atrial fibrillation: Secondary | ICD-10-CM

## 2018-11-13 DIAGNOSIS — Z950 Presence of cardiac pacemaker: Secondary | ICD-10-CM

## 2018-11-13 LAB — BASIC METABOLIC PANEL
BUN / CREAT RATIO: 12 (ref 10–24)
BUN: 14 mg/dL (ref 8–27)
CO2: 24 mmol/L (ref 20–29)
Calcium: 9 mg/dL (ref 8.6–10.2)
Chloride: 101 mmol/L (ref 96–106)
Creatinine, Ser: 1.21 mg/dL (ref 0.76–1.27)
GFR calc Af Amer: 62 mL/min/{1.73_m2} (ref >59)
GFR calc non Af Amer: 54 mL/min/{1.73_m2} — ABNORMAL LOW (ref >59)
Glucose: 119 mg/dL — ABNORMAL HIGH (ref 65–99)
Potassium: 4.3 mmol/L (ref 3.5–5.2)
SODIUM: 140 mmol/L (ref 134–144)

## 2018-11-19 ENCOUNTER — Encounter: Payer: Self-pay | Admitting: Physician Assistant

## 2018-11-19 NOTE — Progress Notes (Addendum)
Cardiology Office Note    Date:  11/20/2018  ID:  Gregory Mccormick, DOB February 18, 1929, MRN [id number] PCP:  Tally JoeSwayne, David, MD  Cardiologist:  Lance MussJayadeep Varanasi, MD   Chief Complaint: f/u SOB  History of Present Illness:  Gregory Mccormick is a 85 y.o. 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  He had remote PCI in 2014 and previously had normal LV function. More recently he was hospitalized in 08/2018 with atypical chest pain. Troponins were flat and CTA was negative for PE. However, his EF was noted to be down - EF 30-35%, diffuse HK, moderate AS, mild MR, mod LAE, severely dilated RA, mildly dilated RV, mild TR, PASP 46, elevated CVP. Dr. Eldridge DaceVaranasi recommended to add Imdur, and consider cath as OP if he continued to have symptoms. He was maintaining sinus rhythm with PVCs.  After hospital discharge the patient began to notice increased fatigue. He was seen back in the office on 10/08/2018 and found to be in atrial fibrillation with RVR. He was started on amiodarone and underwent cardioversion on 10/09/2018 to sinus rhythm in the 70s. He was seen back in clinic 10/18/18 by Dr. Eldridge DaceVaranasi and feeling fatigued. Amiodarone was decreased in case it was contributing. His 11/01/18 device interrogation demonstrated some recurrent afib. Dr. Johney FrameAllred did not advise any specific changes per device report. He saw Lizabeth Leydenina Hammond in follow-up 11/07/18 and he continued to report DOE, activity intolerance, fatigue and lower extremity edema. EKG was AV paced with atrial activity. He was started on Entresto. Lopressor was switched to Toprol. He was encouraged to resume CPAP. Last labs 10/2018 showed normal LFTs, TSH 8.69 (asked to f/u PCP), 09/2018 Hgb 12.8, 08/2018 LDL 46. Repeat  BMET 1/14 showed Cr 1.21 (stable), K 4.3. Lasix was changed to PRN but he acknowledges he is still taking it once daily.  He returns for follow-up stating he feels absolutely great after the changes above. He denies any further fatigue or dyspnea. He has mild lower extremity edema but indicates it is markedly improved on the Entresto. He denies any palpitations or bleeding. No chest pain whatsoever. There is some confusion on whether he is taking Lopressor or the new Toprol. He and son plan to call us when they clarify. Reports only mild rare orthostasis which he manages by standing up slowly. He is so pleased with how he is feeling. His wife is Gregory Mccormick whom we care for.  Past Medical History:  Diagnosis Date  . Anemia, unspecified   . Aortic stenosis   . Arthritis    "right knee" (03/28/2013  . CAD in native artery    takes Xarelto daily  . Cardiomyopathy (HCC)    EF 40%:  ECHO 11/25/2015 EF 55%-60%  . Carpal tunnel syndrome, bilateral   . Chronic systolic CHF (congestive heart failure) (HCC)   . Epistaxis   . Essential hypertension, benign    takes Metoprolol daily  . H/O hiatal hernia   . History of kidney stones   . Hyperlipidemia    takes Lipitor daily  . Nonspecific abnormal unspecified cardiovascular function study   . OSA on CPAP    wears CPAP 02/03/16  . Pacemaker    MDT  . PAF (paroxysmal atrial fibrillation) (HCC)   . Paroxysmal ventricular tachycardia (HCC)   .  PONV (postoperative nausea and vomiting)   . Vitamin D deficiency     Past Surgical History:  Procedure Laterality Date  . CARDIAC CATHETERIZATION  03/21/2013  . CARDIOVERSION N/A 10/09/2018   Procedure: CARDIOVERSION;  Surgeon: Wendall Stade, MD;  Location: [facility] ENDOSCOPY;  Service: Cardiovascular;  Laterality: N/A;  . CARPAL TUNNEL RELEASE Right 02/05/2016   Procedure: RIGHT LIMITED OPEN CARPAL TUNNEL RELEASE;  Surgeon: Dominica Severin, MD;  Location: [facility] OR;  Service: Orthopedics;  Laterality: Right;  .  CARPAL TUNNEL RELEASE Left 04/21/2016   Procedure: CARPAL TUNNEL RELEASE;  Surgeon: Dominica Severin, MD;  Location: [facility] OR;  Service: Orthopedics;  Laterality: Left;  . CATARACT EXTRACTION W/ INTRAOCULAR LENS  IMPLANT, BILATERAL Bilateral 2000's  . COLONOSCOPY    . CORONARY ANGIOPLASTY WITH STENT PLACEMENT  03/28/2013   "3 stents" (03/28/2013)  . INSERT / REPLACE / REMOVE PACEMAKER    . KNEE CARTILAGE SURGERY Left 1972   "cut it open" (03/28/2013)  . KNEE CARTILAGE SURGERY Right ~ 1977   "cut it open" (03/28/2013)  . PACEMAKER INSERTION  10/05/10   MDT Adapta L implanted by Dr [name] for mobitz II second degree AV block  . PERCUTANEOUS CORONARY ROTOBLATOR INTERVENTION (PCI-R) N/A 03/28/2013   Procedure: PERCUTANEOUS CORONARY ROTOBLATOR INTERVENTION (PCI-R);  Surgeon: Corky Crafts, MD;  Location[facility];  Service: Cardiovascular;  Laterality: N/A;  . SEPTOPLASTY N/A 02/05/2016   Procedure: SEPTOPLASTY WITH INTRANASAL SKIN GRAFT;  Surgeon: Drema Halon, MD;  Location: [facility] OR;  Service: ENT;  Laterality: N/A;  . SHOULDER HEMI-ARTHROPLASTY Right 1987   "got hit by sled & dragged down sheet > 200 feet; tore shoulder up" (03/28/2013)  . SHOULDER SURGERY Right 1970's   "pulled muscle apart; sewed it back together" (03/28/2013  . TOTAL KNEE ARTHROPLASTY Right 2007  . TOTAL KNEE ARTHROPLASTY  11/26/2012   Procedure: TOTAL KNEE ARTHROPLASTY;  Surgeon: Loanne Drilling, MD;  Location: [facility] ORS;  Service: Orthopedics;  Laterality: Left;  . TRANSURETHRAL RESECTION OF PROSTATE N/A 12/27/2017   Procedure: TRANSURETHRAL RESECTION OF THE PROSTATE (TURP);  Surgeon: Crista Elliot, MD;  Location: [facility];  Service: Urology;  Laterality: N/A;    Current Medications: Current Meds  Medication Sig  . acetaminophen (TYLENOL) 500 MG tablet Take 1,000 mg by mouth daily as needed for mild pain or headache.   Marland Kitchen amiodarone (PACERONE) 200 MG tablet Take 1 tablet (200 mg total) by mouth  daily.  Marland Kitchen atorvastatin (LIPITOR) 10 MG tablet Take 1 tablet (10 mg total) by mouth daily at 6 PM.  . furosemide (LASIX) 20 MG tablet Take 1 tablet (20 mg total) by mouth daily.  . Glucosamine-Chondroitin (MOVE FREE PO) Take 1 tablet by mouth 2 (two) times daily.   . isosorbide mononitrate (IMDUR) 30 MG 24 hr tablet Take 1 tablet (30 mg total) by mouth daily.  . metoprolol succinate (TOPROL-XL) 25 MG 24 hr tablet Take 25 mg by mouth daily.  . Multiple Vitamins-Minerals (PRESERVISION AREDS 2 PO) Take 1 capsule by mouth 2 (two) times daily.   . nitroGLYCERIN (NITROSTAT) 0.4 MG SL tablet Place 1 tablet (0.4 mg total) under the tongue every 5 (five) minutes as needed for chest pain.  . Omega-3 Fatty Acids (FISH OIL) 1000 MG CAPS Take 1,000 mg by mouth daily.  . sacubitril-valsartan (ENTRESTO) 24-26 MG Take 1 tablet by mouth 2 (two) times daily.  Carlena Hurl 20 MG TABS tablet TAKE 1 TABLET (20 MG TOTAL) BY  MOUTH DAILY WITH SUPPER.       Allergies:   Penicillins and Hydromorphone hcl   Social History   Socioeconomic History  . Marital status: Married    Spouse name: Not on file  . Number of children: Not on file  . Years of education: Not on file  . Highest education level: Not on file  Occupational History  . Occupation: retired    Associate Professor: [employer]  Social Needs  . Financial resource strain: Not on file  . Food insecurity:    Worry: Not on file    Inability: Not on file  . Transportation needs:    Medical: Not on file    Non-medical: Not on file  Tobacco Use  . Smoking status: Former Smoker    Packs/day: 1.00    Years: 26.00    Pack years: 26.00    Types: Cigarettes    Last attempt to quit: 10/31/1973    Years since quitting: 45.0  . Smokeless tobacco: Never Used  Substance and Sexual Activity  . Alcohol use: No  . Drug use: No  . Sexual activity: Never  Lifestyle  . Physical activity:    Days per week: Not on file    Minutes per session: Not on file  . Stress: Not  on file  Relationships  . Social connections:    Talks on phone: Not on file    Gets together: Not on file    Attends religious service: Not on file    Active member of club or organization: Not on file    Attends meetings of clubs or organizations: Not on file    Relationship status: Not on file  Other Topics Concern  . Not on file  Social History Narrative  . Not on file     Family History:  The patient's family history includes CAD (age of onset: 69) in his brother; Cancer in an other family member; Heart disease in an other family member; Stroke in his brother. There is no history of Heart attack or Hypertension.  ROS:   Please see the history of present illness.  All other systems are reviewed and otherwise negative.    PHYSICAL EXAM:   VS:  BP 126/72   Pulse 66   Ht 5\' 8"  (1.727 m)   Wt 186 lb 12.8 oz (84.7 kg)   SpO2 95%   BMI 28.40 kg/m   BMI: Body mass index is 28.4 kg/m. GEN: Well nourished, well developed WM, in no acute distress HEENT: normocephalic, atraumatic Neck: no JVD, carotid bruits, or masses Cardiac: RRR; soft SEM with preserved S2, no rubs or gallops, mild shiny BLE edema  Respiratory:  clear to auscultation bilaterally, normal work of breathing GI: soft, nontender, nondistended, + BS MS: no deformity or atrophy Skin: warm and dry, no rash Neuro:  Alert and Oriented x 3, Strength and sensation are intact, follows commands Psych: euthymic mood, full affect  Wt Readings from Last 3 Encounters:  11/20/18 186 lb 12.8 oz (84.7 kg)  11/07/18 186 lb 12.8 oz (84.7 kg)  10/18/18 184 lb (83.5 kg)      Studies/Labs Reviewed:   EKG:  EKG was ordered today and personally reviewed by me and demonstrates atrial paced rhythm with occasional PVCs, LVH with repolarization abnormality. No sig change from 09/2018  Recent Labs: 09/27/2018: B Natriuretic Peptide 539.4 10/08/2018: Hemoglobin 12.8; Platelets 392 11/07/2018: ALT 18; TSH 8.690 11/13/2018: BUN 14;  Creatinine, Ser 1.21; Potassium 4.3; Sodium 140  Lipid Panel    Component Value Date/Time   CHOL 99 09/28/2018 0251   CHOL 108 11/17/2017 0848   TRIG 28 09/28/2018 0251   HDL 47 09/28/2018 0251   HDL 56 11/17/2017 0848   CHOLHDL 2.1 09/28/2018 0251   VLDL 6 09/28/2018 0251   LDLCALC 46 09/28/2018 0251   LDLCALC 39 11/17/2017 0848    Additional studies/ records that were reviewed today include: Summarized above  ASSESSMENT & PLAN:   1. Chronic systolic CHF/cardiomyopathy - symptoms have significantly improved on Entresto. He has mild residual edema on exam but states it is significantly improved and back to baseline. Given his age and mild orthostasis, I do not think we should push diuretic any further. He is feeling great today. Check BMET. He and his son will call with clarified dose of metoprolol. In the setting of his advanced age and improved symptoms, I do not suspect Dr. Eldridge Dace would suggest any invasive workup, but given his progressive AS and declining EF earlier this year, will send him a message to update him on how the patient is doing and clarify that conservative management would be his advised approach at this time. Reviewed 2g sodium restriction, 2L fluid restriction, daily weights with patient. 2. Aortic stenosis - moderate by echo in 08/2018. There is a potential this could represent low-gradient AS in setting of his low EF. However, he is now feeling significantly better. I will reach out to Dr. Eldridge Dace as above. 3. CAD - no recent chest pain. Continue BB, atorvastatin, Imdur. 4. Paroxysmal atrial fib - EKG appears atrial paced, V-sensed at this time. Rate is controlled. Check BMET. His CrCl will need to be followed closely in future visits as he is right on the border of Xarelto doses. I will defer decision about routine PFTs to primary cardiologist. 5. Essential HTN - controlled. Reports only mild rare orthostasis which he manages by standing up slowly. 6. Abnormal  TSH - son and patient request further clarification on this. Apparently it's hard to get into PCP. Will add free T4 and T3 to labs. 7. PVCs - incidentally noted on EKG. Asymptomatic today. Continue BB. Check K, Mg, thyroid function. PVC burden can be followed by PPM.  Disposition: F/u with Dr. Eldridge Dace in 3 months.  Medication Adjustments/Labs and Tests Ordered: Current medicines are reviewed at length with the patient today.  Concerns regarding medicines are outlined above. Medication changes, Labs and Tests ordered today are summarized above and listed in the Patient Instructions accessible in Encounters.   Signed, Laurann Montana, PA-C  11/20/2018 3:22 PM    [facility] [address] [place], [place], [place]  [address] Phone: [phone]; Fax: [phone]

## 2018-11-20 ENCOUNTER — Encounter: Payer: Self-pay | Admitting: Physician Assistant

## 2018-11-20 ENCOUNTER — Ambulatory Visit (INDEPENDENT_AMBULATORY_CARE_PROVIDER_SITE_OTHER): Payer: Medicare Other | Admitting: Physician Assistant

## 2018-11-20 VITALS — BP 126/72 | HR 66 | Ht 68.0 in | Wt 186.8 lb

## 2018-11-20 DIAGNOSIS — I5022 Chronic systolic (congestive) heart failure: Secondary | ICD-10-CM

## 2018-11-20 DIAGNOSIS — I493 Ventricular premature depolarization: Secondary | ICD-10-CM

## 2018-11-20 DIAGNOSIS — I350 Nonrheumatic aortic (valve) stenosis: Secondary | ICD-10-CM

## 2018-11-20 DIAGNOSIS — I2510 Atherosclerotic heart disease of native coronary artery without angina pectoris: Secondary | ICD-10-CM

## 2018-11-20 DIAGNOSIS — I429 Cardiomyopathy, unspecified: Secondary | ICD-10-CM | POA: Diagnosis not present

## 2018-11-20 DIAGNOSIS — R7989 Other specified abnormal findings of blood chemistry: Secondary | ICD-10-CM

## 2018-11-20 DIAGNOSIS — I10 Essential (primary) hypertension: Secondary | ICD-10-CM

## 2018-11-20 DIAGNOSIS — I480 Paroxysmal atrial fibrillation: Secondary | ICD-10-CM

## 2018-11-20 NOTE — Patient Instructions (Signed)
Medication Instructions:  Your physician recommends that you continue on your current medications as directed. Please refer to the Current Medication list given to you today.  If you need a refill on your cardiac medications before your next appointment, please call your pharmacy.   Lab work: TODAY:  BMET, MAG, CBC, FREE T4, & T3  If you have labs (blood work) drawn today and your tests are completely normal, you will receive your results only by: Marland Kitchen MyChart Message (if you have MyChart) OR . A paper copy in the mail If you have any lab test that is abnormal or we need to change your treatment, we will call you to review the results.  Testing/Procedures: None ordered  Follow-Up: Your physician recommends that you schedule a follow-up appointment in: 3 MONTHS WITH DR. VARANASI   Any Other Special Instructions Will Be Listed Below (If Applicable).

## 2018-11-21 ENCOUNTER — Telehealth: Payer: Self-pay

## 2018-11-21 LAB — CBC
Hematocrit: 42.3 % (ref 37.5–51.0)
Hemoglobin: 13.7 g/dL (ref 13.0–17.7)
MCH: 29.5 pg (ref 26.6–33.0)
MCHC: 32.4 g/dL (ref 31.5–35.7)
MCV: 91 fL (ref 79–97)
PLATELETS: 227 10*3/uL (ref 150–450)
RBC: 4.65 x10E6/uL (ref 4.14–5.80)
RDW: 14.9 % (ref 11.6–15.4)
WBC: 7.1 10*3/uL (ref 3.4–10.8)

## 2018-11-21 LAB — BASIC METABOLIC PANEL
BUN/Creatinine Ratio: 12 (ref 10–24)
BUN: 14 mg/dL (ref 8–27)
CO2: 22 mmol/L (ref 20–29)
Calcium: 9.2 mg/dL (ref 8.6–10.2)
Chloride: 102 mmol/L (ref 96–106)
Creatinine, Ser: 1.18 mg/dL (ref 0.76–1.27)
GFR calc Af Amer: 64 mL/min/{1.73_m2} (ref >59)
GFR calc non Af Amer: 55 mL/min/{1.73_m2} — ABNORMAL LOW (ref >59)
GLUCOSE: 102 mg/dL — AB (ref 65–99)
Potassium: 5.2 mmol/L (ref 3.5–5.2)
Sodium: 143 mmol/L (ref 134–144)

## 2018-11-21 LAB — T3: T3 TOTAL: 94 ng/dL (ref 71–180)

## 2018-11-21 LAB — T4, FREE: Free T4: 1.27 ng/dL (ref 0.82–1.77)

## 2018-11-21 LAB — MAGNESIUM: Magnesium: 2.2 mg/dL (ref 1.6–2.3)

## 2018-11-21 NOTE — Telephone Encounter (Signed)
-----   Message from Berton Bon, NP sent at 11/14/2018  1:08 PM EST ----- Kidney function and potassium are good. No changes.   Berton Bon, NP

## 2018-11-21 NOTE — Telephone Encounter (Signed)
Left message for patient to call office for results. °

## 2018-11-21 NOTE — Telephone Encounter (Signed)
Pt advised lab results as per Lucile Crater PA... and verbalized understanding... he reports that he is taking the Toprol XL  50mg  and will return to see Dr. Eldridge Dace on 02/19/19.

## 2018-11-21 NOTE — Telephone Encounter (Signed)
-----   Message from Laurann Montana, [place] sent at 11/21/2018 11:08 AM EST ----- Please let patient know labs are stable.   ALSO NOTE - it looks like Gregory Mccormick called his this morning to relay lab results from [place] from last week, so please let them know these are the labs we specifically did yesterday.  His potassium is at the upper limits of normal so he needs to make an effort to limit food sources high in potassium such as bananas, squash, yogurt, white beans, sweet potatoes, leafy greens, and avocados. Make sure he checks to see that he is not on any potassium supplements at home or getting any through vitamins.  They were going to clarify their dose of metoprolol at yesterday's visit - he was recently changed from Lopressor to Toprol 50mg , just need to make sure he did in fact make the switch. If he is taking any different dose than this, let me know.  I will route to Dr. Eldridge Dace for review as well - I had sent him a CC'd chart yesterday with an update of the progress Gregory Mccormick made and to clarify there were no specific plans to pursue eval of aortic stenosis in the setting of declining EF, especially since he is now feeling so much better.   It will also be important for he and his cardiologist to discuss kidney function at each visit because his CrCl is right on the border of Xarelto 15 / 20mg  doses. For now it is 55ml/min which still qualifies him for the higher dose but may need to be adjusted in years to come.  Dayna Dunn PA-C   Dayna Dunn PA-C

## 2018-11-21 NOTE — Telephone Encounter (Signed)
Patient returning call.

## 2018-11-22 ENCOUNTER — Telehealth: Payer: Self-pay | Admitting: Physician Assistant

## 2018-11-22 NOTE — Telephone Encounter (Signed)
Please let patient know that I just wanted him to know I reviewed plan with Dr. Eldridge Dace who is in favor of conservative therapy (I.e. medicines instead of procedures). He should let us know if he has any recurrent symptoms before next appt. Dayna Dunn PA-C

## 2018-11-22 NOTE — Telephone Encounter (Signed)
Gregory Mccormick, I spoke with the pt and endorsed yours and Gregory Mccormick  recommendations with conservative therapy.  Pt did however want me to ask you, what meds are you going to be advising on, and when should he start them?  He is aware though, that if he has any reoccurring symptoms before his next appt, he is to call our office and make [place] aware of this.  Informed the pt that I will follow-up with him once you provide further medication instructions.  Please advise!

## 2018-11-22 NOTE — Telephone Encounter (Signed)
Endorsed to the pt that per [facility], she advised that he continue his current med regimen, and keep that trend going.  Pt verbalized understanding and agrees with this plan.

## 2018-11-22 NOTE — Telephone Encounter (Signed)
I'm not sure I understand his question - meds were recently started per OV, and he is doing well on what he is currently on. So I do not wish to make any changes. Coralee North had started [place] recently and changed him to Toprol and he felt much better. Let's keep that trend going. Aldina Porta PA-C

## 2018-11-30 ENCOUNTER — Ambulatory Visit: Payer: Medicare Other | Admitting: Interventional Cardiology

## 2018-12-04 ENCOUNTER — Ambulatory Visit: Payer: Medicare Other | Admitting: Interventional Cardiology

## 2019-01-01 ENCOUNTER — Other Ambulatory Visit: Payer: Self-pay | Admitting: Interventional Cardiology

## 2019-01-21 ENCOUNTER — Other Ambulatory Visit: Payer: Self-pay | Admitting: Nurse Practitioner

## 2019-01-30 ENCOUNTER — Other Ambulatory Visit: Payer: Self-pay | Admitting: Interventional Cardiology

## 2019-01-31 ENCOUNTER — Encounter: Payer: Medicare Other | Admitting: *Deleted

## 2019-01-31 ENCOUNTER — Other Ambulatory Visit: Payer: Self-pay

## 2019-02-01 ENCOUNTER — Telehealth: Payer: Self-pay

## 2019-02-01 NOTE — Telephone Encounter (Signed)
Unable to speak  with patient to remind of missed remote transmission 

## 2019-02-08 ENCOUNTER — Encounter: Payer: Self-pay | Admitting: Cardiology

## 2019-02-11 ENCOUNTER — Telehealth: Payer: Self-pay

## 2019-02-11 NOTE — Telephone Encounter (Signed)
Virtual Visit Pre-Appointment Phone Call    TELEPHONE CALL NOTE  DELDRICK Mccormick has been deemed a candidate for a follow-up tele-health visit to limit community exposure during the Covid-19 pandemic. I spoke with the patient via phone to ensure availability of phone/video source, confirm preferred email & phone number, and discuss instructions and expectations.  I reminded Gregory Mccormick to be prepared with any vital sign and/or heart rhythm information that could potentially be obtained via home monitoring, at the time of his visit. I reminded Gregory Mccormick to expect a phone call at the time of his visit if his visit.  Did the patient verbally acknowledge consent to treatment? YES  Patient does not have a smartphone. Patient agrees to TELEPHONE Visit with Dr. Eldridge Dace on 4/15  [name], RN 02/11/2019 2:00 PM   DOWNLOADING THE WEBEX SOFTWARE TO SMARTPHONE  - If Apple, go to [employer] and type in WebEx in the search bar. Download [employer], the blue/green circle. The app is free but as with any other app downloads, their phone may require them to verify saved payment information or Apple password. The patient does NOT have to create an account.  - If Android, ask patient to go to [employer] and type in WebEx in the search bar. Download [employer], the blue/green circle. The app is free but as with any other app downloads, their phone may require them to verify saved payment information or Android password. The patient does NOT have to create an account.   CONSENT FOR TELE-HEALTH VISIT - PLEASE REVIEW  I hereby voluntarily request, consent and authorize [facility] and its employed or contracted physicians, physician assistants, nurse practitioners or other licensed health care professionals (the Practitioner), to provide me with telemedicine health care services (the "Services") as deemed necessary by the treating Practitioner. I acknowledge and  consent to receive the Services by the Practitioner via telemedicine. I understand that the telemedicine visit will involve communicating with the Practitioner through live audiovisual communication technology and the disclosure of certain medical information by electronic transmission. I acknowledge that I have been given the opportunity to request an in-person assessment or other available alternative prior to the telemedicine visit and am voluntarily participating in the telemedicine visit.  I understand that I have the right to withhold or withdraw my consent to the use of telemedicine in the course of my care at any time, without affecting my right to future care or treatment, and that the Practitioner or I may terminate the telemedicine visit at any time. I understand that I have the right to inspect all information obtained and/or recorded in the course of the telemedicine visit and may receive copies of available information for a reasonable fee.  I understand that some of the potential risks of receiving the Services via telemedicine include:  Marland Kitchen Delay or interruption in medical evaluation due to technological equipment failure or disruption; . Information transmitted may not be sufficient (e.g. poor resolution of images) to allow for appropriate medical decision making by the Practitioner; and/or  . In rare instances, security protocols could fail, causing a breach of personal health information.  Furthermore, I acknowledge that it is my responsibility to provide information about my medical history, conditions and care that is complete and accurate to the best of my ability. I acknowledge that Practitioner's advice, recommendations, and/or decision may be based on factors not within their control, such as incomplete or inaccurate data  provided by me or distortions of diagnostic images or specimens that may result from electronic transmissions. I understand that the practice of medicine is not an  exact science and that Practitioner makes no warranties or guarantees regarding treatment outcomes. I acknowledge that I will receive a copy of this consent concurrently upon execution via email to the email address I last provided but may also request a printed copy by calling the office of [facility].    I understand that my insurance will be billed for this visit.   I have read or had this consent read to me. . I understand the contents of this consent, which adequately explains the benefits and risks of the Services being provided via telemedicine.  . I have been provided ample opportunity to ask questions regarding this consent and the Services and have had my questions answered to my satisfaction. . I give my informed consent for the services to be provided through the use of telemedicine in my medical care  By participating in this telemedicine visit I agree to the above.

## 2019-02-13 ENCOUNTER — Other Ambulatory Visit: Payer: Self-pay

## 2019-02-13 ENCOUNTER — Encounter: Payer: Self-pay | Admitting: Interventional Cardiology

## 2019-02-13 ENCOUNTER — Telehealth (INDEPENDENT_AMBULATORY_CARE_PROVIDER_SITE_OTHER): Payer: Medicare Other | Admitting: Interventional Cardiology

## 2019-02-13 DIAGNOSIS — I25118 Atherosclerotic heart disease of native coronary artery with other forms of angina pectoris: Secondary | ICD-10-CM

## 2019-02-13 DIAGNOSIS — Z950 Presence of cardiac pacemaker: Secondary | ICD-10-CM

## 2019-02-13 DIAGNOSIS — I350 Nonrheumatic aortic (valve) stenosis: Secondary | ICD-10-CM

## 2019-02-13 DIAGNOSIS — I519 Heart disease, unspecified: Secondary | ICD-10-CM

## 2019-02-13 DIAGNOSIS — I480 Paroxysmal atrial fibrillation: Secondary | ICD-10-CM | POA: Diagnosis not present

## 2019-02-13 NOTE — Patient Instructions (Signed)

## 2019-02-13 NOTE — Progress Notes (Signed)
Virtual Visit via Telephone Note   This visit type was conducted due to national recommendations for restrictions regarding the COVID-19 Pandemic (e.g. social distancing) in an effort to limit this patient's exposure and mitigate transmission in our community.  Due to his co-morbid illnesses, this patient is at least at moderate risk for complications without adequate follow up.  This format is felt to be most appropriate for this patient at this time.  The patient did not have access to video technology/had technical difficulties with video requiring transitioning to audio format only (telephone).  All issues noted in this document were discussed and addressed.  No physical exam could be performed with this format.  Please refer to the patient's chart for his  consent to telehealth [facility].   Evaluation Performed:  Follow-up visit  Date:  02/13/2019   ID:  Gregory Mccormick, DOB 1929-08-27, MRN [id number]  Patient Location: Home Provider Location: Home  PCP:  Tally JoeSwayne, David, MD  Cardiologist:  Lance MussJayadeep Ketara Cavness, MD  Electrophysiologist:  None   Chief Complaint:  CAD  History of Present Illness:    Gregory Mccormick is a 85 y.o. male with with history of CAD s/p stents to LAD and circumflex in 2014, PAF on Xarelto, [employer] permanent pacemaker for second-degree heart block, OSA no longer using CPAP, hypertension, hyperlipidemia, probable CKD stage II-III, cardiomyopathy and hiatal hernia who presents to f/u afib/cardiomyopathy.  He had remote PCI in 2014 and previously had normal LV function.   He was hospitalized in 08/2018 with atypical chest pain. Troponins were flat and CTA was negative for PE. However, his EF was noted to be down - EF 30-35%, diffuse HK, moderate AS, mild MR, mod LAE, severely dilated RA, mildly dilated RV, mild TR, PASP 46, elevated CVP.  Denies : Chest pain. Dizziness. Leg edema. Nitroglycerin use. Orthopnea. Palpitations. Paroxysmal nocturnal dyspnea.  Shortness of breath. Syncope.   His sons help get his groceries.    The patient does not have symptoms concerning for COVID-19 infection (fever, chills, cough, or new shortness of breath).    Past Medical History:  Diagnosis Date  . Anemia, unspecified   . Aortic stenosis   . Arthritis    "right knee" (03/28/2013  . CAD in native artery    takes Xarelto daily  . Cardiomyopathy (HCC)    EF 40%:  ECHO 11/25/2015 EF 55%-60%  . Carpal tunnel syndrome, bilateral   . Chronic systolic CHF (congestive heart failure) (HCC)   . Epistaxis   . Essential hypertension, benign    takes Metoprolol daily  . H/O hiatal hernia   . History of kidney stones   . Hyperlipidemia    takes Lipitor daily  . Nonspecific abnormal unspecified cardiovascular function study   . OSA on CPAP    wears CPAP 02/03/16  . Pacemaker    MDT  . PAF (paroxysmal atrial fibrillation) (HCC)   . Paroxysmal ventricular tachycardia (HCC)   . PONV (postoperative nausea and vomiting)   . Vitamin D deficiency    Past Surgical History:  Procedure Laterality Date  . CARDIAC CATHETERIZATION  03/21/2013  . CARDIOVERSION N/A 10/09/2018   Procedure: CARDIOVERSION;  Surgeon: Wendall StadeNishan, Peter C, MD;  [facility];  Service: Cardiovascular;  Laterality: N/A;  . CARPAL TUNNEL RELEASE Right 02/05/2016   Procedure: RIGHT LIMITED OPEN CARPAL TUNNEL RELEASE;  Surgeon: Dominica SeverinWilliam Gramig, MD;  Location: [facility] OR;  Service: Orthopedics;  Laterality: Right;  . CARPAL TUNNEL RELEASE Left 04/21/2016  Procedure: CARPAL TUNNEL RELEASE;  Surgeon: Dominica Severin, MD;  Location: [facility] OR;  Service: Orthopedics;  Laterality: Left;  . CATARACT EXTRACTION W/ INTRAOCULAR LENS  IMPLANT, BILATERAL Bilateral 2000's  . COLONOSCOPY    . CORONARY ANGIOPLASTY WITH STENT PLACEMENT  03/28/2013   "3 stents" (03/28/2013)  . INSERT / REPLACE / REMOVE PACEMAKER    . KNEE CARTILAGE SURGERY Left 1972   "cut it open" (03/28/2013)  . KNEE CARTILAGE SURGERY Right ~ 1977    "cut it open" (03/28/2013)  . PACEMAKER INSERTION  10/05/10   MDT Adapta L implanted by Dr [name] for mobitz II second degree AV block  . PERCUTANEOUS CORONARY ROTOBLATOR INTERVENTION (PCI-R) N/A 03/28/2013   Procedure: PERCUTANEOUS CORONARY ROTOBLATOR INTERVENTION (PCI-R);  Surgeon: Corky Crafts, MD;  Location: [facility] CATH LAB;  Service: Cardiovascular;  Laterality: N/A;  . SEPTOPLASTY N/A 02/05/2016   Procedure: SEPTOPLASTY WITH INTRANASAL SKIN GRAFT;  Surgeon: Drema Halon, MD;  Location: [facility] OR;  Service: ENT;  Laterality: N/A;  . SHOULDER HEMI-ARTHROPLASTY Right 1987   "got hit by sled & dragged down sheet > 200 feet; tore shoulder up" (03/28/2013)  . SHOULDER SURGERY Right 1970's   "pulled muscle apart; sewed it back together" (03/28/2013  . TOTAL KNEE ARTHROPLASTY Right 2007  . TOTAL KNEE ARTHROPLASTY  11/26/2012   Procedure: TOTAL KNEE ARTHROPLASTY;  Surgeon: Loanne Drilling, MD;  Location: [facility] ORS;  Service: Orthopedics;  Laterality: Left;  . TRANSURETHRAL RESECTION OF PROSTATE N/A 12/27/2017   Procedure: TRANSURETHRAL RESECTION OF THE PROSTATE (TURP);  Surgeon: Crista Elliot, MD;  Location: [facility];  Service: Urology;  Laterality: N/A;     Current Meds  Medication Sig  . acetaminophen (TYLENOL) 500 MG tablet Take 1,000 mg by mouth daily as needed for mild pain or headache.   Marland Kitchen amiodarone (PACERONE) 200 MG tablet Take 1 tablet (200 mg total) by mouth daily.  Marland Kitchen atorvastatin (LIPITOR) 10 MG tablet TAKE 1 TABLET BY MOUTH DAILY AT 6 PM.  . furosemide (LASIX) 20 MG tablet Take 1 tablet (20 mg total) by mouth daily.  . Glucosamine-Chondroitin (MOVE FREE PO) Take 1 tablet by mouth 2 (two) times daily.   . isosorbide mononitrate (IMDUR) 30 MG 24 hr tablet Take 1 tablet (30 mg total) by mouth daily.  . metoprolol succinate (TOPROL-XL) 50 MG 24 hr tablet Take 50 mg by mouth daily. Take with or immediately following a meal.  . Multiple Vitamins-Minerals (PRESERVISION  AREDS 2 PO) Take 1 capsule by mouth 2 (two) times daily.   . nitroGLYCERIN (NITROSTAT) 0.4 MG SL tablet Place 1 tablet (0.4 mg total) under the tongue every 5 (five) minutes as needed for chest pain.  . Omega-3 Fatty Acids (FISH OIL) 1000 MG CAPS Take 1,000 mg by mouth daily.  . sacubitril-valsartan (ENTRESTO) 24-26 MG Take 1 tablet by mouth 2 (two) times daily.  Carlena Hurl 20 MG TABS tablet TAKE 1 TABLET (20 MG TOTAL) BY MOUTH DAILY WITH SUPPER.     Allergies:   Penicillins and Hydromorphone hcl   Social History   Tobacco Use  . Smoking status: Former Smoker    Packs/day: 1.00    Years: 26.00    Pack years: 26.00    Types: Cigarettes    Last attempt to quit: 10/31/1973    Years since quitting: 45.3  . Smokeless tobacco: Never Used  Substance Use Topics  . Alcohol use: No  . Drug use: No     Family  Hx: The patient's family history includes CAD (age of onset: 5) in his brother; Cancer in an other family member; Heart disease in an other family member; Stroke in his brother. There is no history of Heart attack or Hypertension.  ROS:   Please see the history of present illness.     All other systems reviewed and are negative.   Prior CV studies:   The following studies were reviewed today:    Labs/Other Tests and Data Reviewed:    EKG:  atrial paced, PVCs in Jan 2020  Recent Labs: 09/27/2018: B Natriuretic Peptide 539.4 11/07/2018: ALT 18; TSH 8.690 11/20/2018: BUN 14; Creatinine, Ser 1.18; Hemoglobin 13.7; Magnesium 2.2; Platelets 227; Potassium 5.2; Sodium 143   Recent Lipid Panel Lab Results  Component Value Date/Time   CHOL 99 09/28/2018 02:51 AM   CHOL 108 11/17/2017 08:48 AM   TRIG 28 09/28/2018 02:51 AM   HDL 47 09/28/2018 02:51 AM   HDL 56 11/17/2017 08:48 AM   CHOLHDL 2.1 09/28/2018 02:51 AM   LDLCALC 46 09/28/2018 02:51 AM   LDLCALC 39 11/17/2017 08:48 AM    Wt Readings from Last 3 Encounters:  02/13/19 185 lb (83.9 kg)  11/20/18 186 lb 12.8 oz (84.7  kg)  11/07/18 186 lb 12.8 oz (84.7 kg)     Objective:    Vital Signs:  BP 116/70   Pulse 76   Ht 5\' 8"  (1.727 m)   Wt 185 lb (83.9 kg)   BMI 28.13 kg/m    Well nourished, well developed male in no acute distress. No Shortness of breath  ASSESSMENT & PLAN:    1. CAD: No angina.  COntinue aggressive secondary prevention. No bleeding issues.  2. Aortic stenosis: Moderate.  No symptoms of severe left ear.  He will let us know if he has any significant dizziness or syncope. 3. Pacemaker in place.  Remote check planned for next month. 4. Chronic systolic heart failure: No shortness of breath over the phone.  He reports no orthopnea.  He appears euvolemic.  Labs from January were well controlled. 5. Atrial fibrillation: Xarelto for stroke prevention.  Asymptomatic PVCs noted on prior pacemaker check.  COVID-19 Education: The signs and symptoms of COVID-19 were discussed with the patient and how to seek care for testing (follow up with PCP or arrange E-visit).  The importance of social distancing was discussed today.  Time:   Today, I have spent 15 minutes with the patient with telehealth technology discussing the above problems.     Medication Adjustments/Labs and Tests Ordered: Current medicines are reviewed at length with the patient today.  Concerns regarding medicines are outlined above.   Tests Ordered: No orders of the defined types were placed in this encounter.   Medication Changes: No orders of the defined types were placed in this encounter.   Disposition:  Follow up in 6 month(s)  Signed, Lance Muss, MD  02/13/2019 10:47 AM    [facility]

## 2019-02-19 ENCOUNTER — Ambulatory Visit: Payer: Medicare Other | Admitting: Interventional Cardiology

## 2019-02-26 ENCOUNTER — Ambulatory Visit (INDEPENDENT_AMBULATORY_CARE_PROVIDER_SITE_OTHER): Payer: Medicare Other | Admitting: *Deleted

## 2019-02-26 DIAGNOSIS — I519 Heart disease, unspecified: Secondary | ICD-10-CM

## 2019-02-26 DIAGNOSIS — I480 Paroxysmal atrial fibrillation: Secondary | ICD-10-CM

## 2019-02-27 LAB — CUP PACEART REMOTE DEVICE CHECK
Battery Impedance: 1028 Ohm
Battery Remaining Longevity: 61 mo
Battery Voltage: 2.77 V
Brady Statistic AP VP Percent: 4 %
Brady Statistic AP VS Percent: 71 %
Brady Statistic AS VP Percent: 5 %
Brady Statistic AS VS Percent: 21 %
Date Time Interrogation Session: 20200428150610
Implantable Lead Implant Date: 20111206
Implantable Lead Implant Date: 20111206
Implantable Lead Location: 753859
Implantable Lead Location: 753860
Implantable Lead Model: 5076
Implantable Lead Model: 5092
Implantable Pulse Generator Implant Date: 20111206
Lead Channel Impedance Value: 454 Ohm
Lead Channel Impedance Value: 783 Ohm
Lead Channel Pacing Threshold Amplitude: 0.875 V
Lead Channel Pacing Threshold Amplitude: 1 V
Lead Channel Pacing Threshold Pulse Width: 0.4 ms
Lead Channel Pacing Threshold Pulse Width: 0.4 ms
Lead Channel Setting Pacing Amplitude: 2 V
Lead Channel Setting Pacing Amplitude: 2.5 V
Lead Channel Setting Pacing Pulse Width: 0.4 ms
Lead Channel Setting Sensing Sensitivity: 5.6 mV

## 2019-03-06 ENCOUNTER — Other Ambulatory Visit: Payer: Self-pay

## 2019-03-06 NOTE — Progress Notes (Signed)
Remote pacemaker transmission.   

## 2019-03-20 ENCOUNTER — Telehealth: Payer: Self-pay | Admitting: Interventional Cardiology

## 2019-03-20 NOTE — Telephone Encounter (Addendum)
Called and spoke to patient. He went bottle by bottle and reviewed all of his meds and he is taking the following meds as prescribed:  amio 200 mg QD Lasix 20 mg QD Atorvastatin 10 mg once at 6 pm  xarelto 20 mg with supper Metoprolol succinate 50 mg QD entresto 24-26 mg BID  Patient BP 122/76 HR 78.  Patient states that he is not taking his imdur 30 mg QD.  Patient denies having any Sx other than some occasional dizziness. Patient states that this is associated with position changes. Patient states that he does drink a lot of Pepsi and coffee. Patient states that these are decaf. Instructed for the patient to stay hydrated with water and to change positions slowly. Instructed patient to let us know if his Sx changed or worsened. Patient has a televisit with PCP tomorrow. Patient verbalized understanding and thanked me for the call.

## 2019-03-20 NOTE — Telephone Encounter (Signed)
Gregory Mccormick from [facility] called. He has a televisit scheduled tomorrow.   Gregory Mccormick called today to verify all of his medications, and she states that his current medication routine is different from what Dr. Eldridge Dace ordered,  He is currently taking both Metoprolol Succinate 50mg  1x daily and Metoprolol Tartrate 25mg  in the AM and 50mg  at night  He is supposed to be taking Isosorbide 30mg  tablet, but the patient is not taking it   He is only supposed to be taking Amiodarone  200mg , but Gregory Mccormick states he is taking that medication 2x daily  Gregory Mccormick would like someone to go over all of his medications with him, because he is taking too many medications that are slowing his heart rate.

## 2019-03-21 NOTE — Telephone Encounter (Signed)
° ° °  Patient calling with additional questions and to report dizziness

## 2019-03-21 NOTE — Telephone Encounter (Signed)
Patient calling back and wanting to know if Dr. Eldridge Dace wants to make any changes to his meds or if he wants him to be evaluated. Made patient aware that I would send to Dr. Eldridge Dace to review the message below. Patient will let us know if his Sx worsen.

## 2019-03-22 MED ORDER — METOPROLOL SUCCINATE 25 MG PO CS24: 25.0000 mg | EXTENDED_RELEASE_CAPSULE | Freq: Every day | ORAL | 3 refills | Status: DC

## 2019-03-22 NOTE — Telephone Encounter (Signed)
Called and spoke to patient and instructed him to decrease metoprolol succinate to 25 mg once a day to see if his dizziness. Patient verbalized understanding and will let us know if his Sx do not improve.

## 2019-03-22 NOTE — Telephone Encounter (Signed)
Decrease metoprolol to 25 mg daily. See if dizziness is better.

## 2019-04-28 ENCOUNTER — Other Ambulatory Visit: Payer: Self-pay

## 2019-04-28 ENCOUNTER — Emergency Department (HOSPITAL_COMMUNITY): Payer: Medicare Other

## 2019-04-28 ENCOUNTER — Inpatient Hospital Stay (HOSPITAL_COMMUNITY)
Admission: EM | Admit: 2019-04-28 | Discharge: 2019-05-02 | DRG: 470 | Disposition: A | Discharge status: Home / Self Care | Payer: Medicare Other | Attending: Internal Medicine | Admitting: Internal Medicine

## 2019-04-28 ENCOUNTER — Encounter (HOSPITAL_COMMUNITY): Payer: Self-pay

## 2019-04-28 DIAGNOSIS — I5042 Chronic combined systolic (congestive) and diastolic (congestive) heart failure: Secondary | ICD-10-CM | POA: Diagnosis not present

## 2019-04-28 DIAGNOSIS — I2510 Atherosclerotic heart disease of native coronary artery without angina pectoris: Secondary | ICD-10-CM | POA: Diagnosis present

## 2019-04-28 DIAGNOSIS — Z87891 Personal history of nicotine dependence: Secondary | ICD-10-CM | POA: Diagnosis not present

## 2019-04-28 DIAGNOSIS — E039 Hypothyroidism, unspecified: Secondary | ICD-10-CM | POA: Diagnosis not present

## 2019-04-28 DIAGNOSIS — S72001A Fracture of unspecified part of neck of right femur, initial encounter for closed fracture: Secondary | ICD-10-CM | POA: Diagnosis present

## 2019-04-28 DIAGNOSIS — I429 Cardiomyopathy, unspecified: Secondary | ICD-10-CM | POA: Diagnosis present

## 2019-04-28 DIAGNOSIS — I5032 Chronic diastolic (congestive) heart failure: Secondary | ICD-10-CM | POA: Diagnosis not present

## 2019-04-28 DIAGNOSIS — D649 Anemia, unspecified: Secondary | ICD-10-CM | POA: Diagnosis not present

## 2019-04-28 DIAGNOSIS — G4733 Obstructive sleep apnea (adult) (pediatric): Secondary | ICD-10-CM | POA: Diagnosis present

## 2019-04-28 DIAGNOSIS — Y92009 Unspecified place in unspecified non-institutional (private) residence as the place of occurrence of the external cause: Secondary | ICD-10-CM

## 2019-04-28 DIAGNOSIS — E785 Hyperlipidemia, unspecified: Secondary | ICD-10-CM | POA: Diagnosis not present

## 2019-04-28 DIAGNOSIS — Z950 Presence of cardiac pacemaker: Secondary | ICD-10-CM | POA: Diagnosis not present

## 2019-04-28 DIAGNOSIS — Z9989 Dependence on other enabling machines and devices: Secondary | ICD-10-CM | POA: Diagnosis not present

## 2019-04-28 DIAGNOSIS — N182 Chronic kidney disease, stage 2 (mild): Secondary | ICD-10-CM | POA: Diagnosis not present

## 2019-04-28 DIAGNOSIS — I10 Essential (primary) hypertension: Secondary | ICD-10-CM | POA: Diagnosis present

## 2019-04-28 DIAGNOSIS — Z7901 Long term (current) use of anticoagulants: Secondary | ICD-10-CM

## 2019-04-28 DIAGNOSIS — D696 Thrombocytopenia, unspecified: Secondary | ICD-10-CM | POA: Diagnosis not present

## 2019-04-28 DIAGNOSIS — I441 Atrioventricular block, second degree: Secondary | ICD-10-CM | POA: Diagnosis not present

## 2019-04-28 DIAGNOSIS — Z96651 Presence of right artificial knee joint: Secondary | ICD-10-CM | POA: Diagnosis present

## 2019-04-28 DIAGNOSIS — Z96649 Presence of unspecified artificial hip joint: Secondary | ICD-10-CM | POA: Diagnosis not present

## 2019-04-28 DIAGNOSIS — I480 Paroxysmal atrial fibrillation: Secondary | ICD-10-CM | POA: Diagnosis not present

## 2019-04-28 DIAGNOSIS — Z823 Family history of stroke: Secondary | ICD-10-CM

## 2019-04-28 DIAGNOSIS — W010XXA Fall on same level from slipping, tripping and stumbling without subsequent striking against object, initial encounter: Secondary | ICD-10-CM | POA: Diagnosis present

## 2019-04-28 DIAGNOSIS — S72011A Unspecified intracapsular fracture of right femur, initial encounter for closed fracture: Secondary | ICD-10-CM | POA: Diagnosis not present

## 2019-04-28 DIAGNOSIS — I4891 Unspecified atrial fibrillation: Secondary | ICD-10-CM | POA: Diagnosis present

## 2019-04-28 DIAGNOSIS — H409 Unspecified glaucoma: Secondary | ICD-10-CM | POA: Diagnosis present

## 2019-04-28 DIAGNOSIS — Z955 Presence of coronary angioplasty implant and graft: Secondary | ICD-10-CM | POA: Diagnosis not present

## 2019-04-28 DIAGNOSIS — I5022 Chronic systolic (congestive) heart failure: Secondary | ICD-10-CM | POA: Diagnosis not present

## 2019-04-28 DIAGNOSIS — Z01810 Encounter for preprocedural cardiovascular examination: Secondary | ICD-10-CM | POA: Diagnosis not present

## 2019-04-28 DIAGNOSIS — N400 Enlarged prostate without lower urinary tract symptoms: Secondary | ICD-10-CM | POA: Diagnosis present

## 2019-04-28 DIAGNOSIS — I130 Hypertensive heart and chronic kidney disease with heart failure and stage 1 through stage 4 chronic kidney disease, or unspecified chronic kidney disease: Secondary | ICD-10-CM | POA: Diagnosis present

## 2019-04-28 DIAGNOSIS — I350 Nonrheumatic aortic (valve) stenosis: Secondary | ICD-10-CM | POA: Diagnosis present

## 2019-04-28 DIAGNOSIS — Z8249 Family history of ischemic heart disease and other diseases of the circulatory system: Secondary | ICD-10-CM | POA: Diagnosis not present

## 2019-04-28 DIAGNOSIS — S72001P Fracture of unspecified part of neck of right femur, subsequent encounter for closed fracture with malunion: Secondary | ICD-10-CM | POA: Diagnosis not present

## 2019-04-28 DIAGNOSIS — Z880 Allergy status to penicillin: Secondary | ICD-10-CM

## 2019-04-28 DIAGNOSIS — Z1159 Encounter for screening for other viral diseases: Secondary | ICD-10-CM | POA: Diagnosis not present

## 2019-04-28 HISTORY — DX: Chronic kidney disease, stage 2 (mild): N18.2

## 2019-04-28 HISTORY — DX: Ventricular premature depolarization: I49.3

## 2019-04-28 LAB — BASIC METABOLIC PANEL
Anion gap: 10 (ref 5–15)
BUN: 22 mg/dL (ref 8–23)
CO2: 27 mmol/L (ref 22–32)
Calcium: 8.9 mg/dL (ref 8.9–10.3)
Chloride: 106 mmol/L (ref 98–111)
Creatinine, Ser: 1.46 mg/dL — ABNORMAL HIGH (ref 0.61–1.24)
GFR calc Af Amer: 49 mL/min — ABNORMAL LOW (ref >60)
GFR calc non Af Amer: 42 mL/min — ABNORMAL LOW (ref >60)
Glucose, Bld: 94 mg/dL (ref 70–99)
Potassium: 4.3 mmol/L (ref 3.5–5.1)
Sodium: 143 mmol/L (ref 135–145)

## 2019-04-28 LAB — CBC WITH DIFFERENTIAL/PLATELET
Abs Immature Granulocytes: 0.05 10*3/uL (ref 0.00–0.07)
Basophils Absolute: 0 10*3/uL (ref 0.0–0.1)
Basophils Relative: 0 %
Eosinophils Absolute: 0.1 10*3/uL (ref 0.0–0.5)
Eosinophils Relative: 1 %
HCT: 39.9 % (ref 39.0–52.0)
Hemoglobin: 12.4 g/dL — ABNORMAL LOW (ref 13.0–17.0)
Immature Granulocytes: 1 %
Lymphocytes Relative: 6 %
Lymphs Abs: 0.6 10*3/uL — ABNORMAL LOW (ref 0.7–4.0)
MCH: 31.6 pg (ref 26.0–34.0)
MCHC: 31.1 g/dL (ref 30.0–36.0)
MCV: 101.8 fL — ABNORMAL HIGH (ref 80.0–100.0)
Monocytes Absolute: 0.5 10*3/uL (ref 0.1–1.0)
Monocytes Relative: 5 %
Neutro Abs: 8.7 10*3/uL — ABNORMAL HIGH (ref 1.7–7.7)
Neutrophils Relative %: 87 %
Platelets: 162 10*3/uL (ref 150–400)
RBC: 3.92 MIL/uL — ABNORMAL LOW (ref 4.22–5.81)
RDW: 14.2 % (ref 11.5–15.5)
WBC: 9.8 10*3/uL (ref 4.0–10.5)
nRBC: 0 % (ref 0.0–0.2)

## 2019-04-28 LAB — PROTIME-INR
INR: 1.5 — ABNORMAL HIGH (ref 0.8–1.2)
Prothrombin Time: 17.8 seconds — ABNORMAL HIGH (ref 11.4–15.2)

## 2019-04-28 LAB — SARS CORONAVIRUS 2 BY RT PCR (HOSPITAL ORDER, PERFORMED IN ~~LOC~~ HOSPITAL LAB): SARS Coronavirus 2: NEGATIVE

## 2019-04-28 MED ORDER — LORAZEPAM 2 MG/ML IJ SOLN
1.0000 mg | Freq: Once | INTRAMUSCULAR | Status: AC
  Administered 2019-04-28: 1 mg via INTRAVENOUS
  Filled 2019-04-28: qty 1

## 2019-04-28 MED ORDER — ACETAMINOPHEN 325 MG PO TABS
650.0000 mg | ORAL_TABLET | Freq: Four times a day (QID) | ORAL | Status: DC | PRN
  Administered 2019-04-29: 650 mg via ORAL
  Filled 2019-04-28: qty 2

## 2019-04-28 MED ORDER — ONDANSETRON HCL 4 MG/2ML IJ SOLN: 4.0000 mg | Freq: Four times a day (QID) | INTRAMUSCULAR | Status: DC | PRN

## 2019-04-28 MED ORDER — ACETAMINOPHEN 650 MG RE SUPP: 650.0000 mg | Freq: Four times a day (QID) | RECTAL | Status: DC | PRN

## 2019-04-28 MED ORDER — SODIUM CHLORIDE 0.9 % IV SOLN
INTRAVENOUS | Status: DC
  Administered 2019-04-28: 20:00:00 via INTRAVENOUS

## 2019-04-28 MED ORDER — MORPHINE SULFATE (PF) 2 MG/ML IV SOLN
1.0000 mg | INTRAVENOUS | Status: DC | PRN
  Administered 2019-04-28: 1 mg via INTRAVENOUS
  Filled 2019-04-28: qty 1

## 2019-04-28 MED ORDER — MORPHINE SULFATE (PF) 4 MG/ML IV SOLN
6.0000 mg | Freq: Once | INTRAVENOUS | Status: AC
  Administered 2019-04-28: 6 mg via INTRAVENOUS
  Filled 2019-04-28: qty 2

## 2019-04-28 MED ORDER — SODIUM CHLORIDE 0.9 % IV SOLN
INTRAVENOUS | Status: AC
  Administered 2019-04-28: 23:00:00 via INTRAVENOUS

## 2019-04-28 NOTE — ED Provider Notes (Addendum)
[facility]-EMERGENCY DEPT Provider Note   CSN: [id number] Arrival date & time: 04/28/19  1745     History   Chief Complaint Chief Complaint  Patient presents with  . Fall  . Hip Pain    HPI Gregory Mccormick is a 85 y.o. male.     HPI   85 year old male with right hip pain.  Mechanical fall at home.  Happened shortly before arrival.  Severe persistent right hip pain since then.  He is on Xarelto.  Does not think he hit his head.  Denies any neck pain.  Past Medical History:  Diagnosis Date  . Anemia, unspecified   . Aortic stenosis   . Arthritis    "right knee" (03/28/2013  . CAD in native artery    takes Xarelto daily  . Cardiomyopathy (HCC)    EF 40%:  ECHO 11/25/2015 EF 55%-60%  . Carpal tunnel syndrome, bilateral   . Chronic systolic CHF (congestive heart failure) (HCC)   . Epistaxis   . Essential hypertension, benign    takes Metoprolol daily  . H/O hiatal hernia   . History of kidney stones   . Hyperlipidemia    takes Lipitor daily  . Nonspecific abnormal unspecified cardiovascular function study   . OSA on CPAP    wears CPAP 02/03/16  . Pacemaker    MDT  . PAF (paroxysmal atrial fibrillation) (HCC)   . Paroxysmal ventricular tachycardia (HCC)   . PONV (postoperative nausea and vomiting)   . Vitamin D deficiency     Patient Active Problem List   Diagnosis Date Noted  . Fatigue 11/07/2018  . Presence of cardiac pacemaker 11/07/2018  . OSA on CPAP 09/27/2018  . Acute renal failure superimposed on stage 2 chronic kidney disease (HCC) 09/27/2018  . Chronic diastolic (congestive) heart failure (HCC) 09/27/2018  . Chest pain 09/27/2018  . BPH (benign prostatic hyperplasia) 12/27/2017  . Atrial fibrillation (HCC) 01/25/2016  . Hyperlipidemia 10/06/2015  . Coronary atherosclerosis of native coronary artery 02/13/2014  . Chronic systolic dysfunction of left ventricle 12/05/2013  . Nonspecific abnormal unspecified cardiovascular function  study   . Other primary cardiomyopathies   . Paroxysmal ventricular tachycardia (HCC)   . Postop Acute blood loss anemia 11/27/2012  . Postop Hyponatremia 11/27/2012  . OA (osteoarthritis) of knee 11/26/2012  . Mobitz type II atrioventricular block 01/24/2011  . Essential hypertension, benign 09/27/2010  . ATRIOVENTRICULAR BLOCK, 2ND DEGREE 09/27/2010  . SYNCOPE 09/27/2010    Past Surgical History:  Procedure Laterality Date  . CARDIAC CATHETERIZATION  03/21/2013  . CARDIOVERSION N/A 10/09/2018   Procedure: CARDIOVERSION;  Surgeon: Wendall StadeNishan, Peter C, MD;  [facility];  Service: Cardiovascular;  Laterality: N/A;  . CARPAL TUNNEL RELEASE Right 02/05/2016   Procedure: RIGHT LIMITED OPEN CARPAL TUNNEL RELEASE;  Surgeon: Dominica SeverinWilliam Gramig, MD;  Location: [facility] OR;  Service: Orthopedics;  Laterality: Right;  . CARPAL TUNNEL RELEASE Left 04/21/2016   Procedure: CARPAL TUNNEL RELEASE;  Surgeon: Dominica SeverinWilliam Gramig, MD;  Location: [facility] OR;  Service: Orthopedics;  Laterality: Left;  . CATARACT EXTRACTION W/ INTRAOCULAR LENS  IMPLANT, BILATERAL Bilateral 2000's  . COLONOSCOPY    . CORONARY ANGIOPLASTY WITH STENT PLACEMENT  03/28/2013   "3 stents" (03/28/2013)  . INSERT / REPLACE / REMOVE PACEMAKER    . KNEE CARTILAGE SURGERY Left 1972   "cut it open" (03/28/2013)  . KNEE CARTILAGE SURGERY Right ~ 1977   "cut it open" (03/28/2013)  . PACEMAKER INSERTION  10/05/10   MDT  Adapta L implanted by Dr Rayann Heman for mobitz II second degree AV block  . PERCUTANEOUS CORONARY ROTOBLATOR INTERVENTION (PCI-R) N/A 03/28/2013   Procedure: PERCUTANEOUS CORONARY ROTOBLATOR INTERVENTION (PCI-R);  Surgeon: Jettie Booze, MD;  Location: [facility] CATH LAB;  Service: Cardiovascular;  Laterality: N/A;  . SEPTOPLASTY N/A 02/05/2016   Procedure: SEPTOPLASTY WITH INTRANASAL SKIN GRAFT;  Surgeon: Rozetta Nunnery, MD;  Location: [facility];  Service: ENT;  Laterality: N/A;  . SHOULDER HEMI-ARTHROPLASTY Right 1987   "got hit by sled &  dragged down sheet > 200 feet; tore shoulder up" (03/28/2013)  . SHOULDER SURGERY Right 1970's   "pulled muscle apart; sewed it back together" (03/28/2013  . TOTAL KNEE ARTHROPLASTY Right 2007  . TOTAL KNEE ARTHROPLASTY  11/26/2012   Procedure: TOTAL KNEE ARTHROPLASTY;  Surgeon: Gearlean Alf, MD;  Location: [facility] ORS;  Service: Orthopedics;  Laterality: Left;  . TRANSURETHRAL RESECTION OF PROSTATE N/A 12/27/2017   Procedure: TRANSURETHRAL RESECTION OF THE PROSTATE (TURP);  Surgeon: Lucas Mallow, MD;  Location: [facility];  Service: Urology;  Laterality: N/A;        Home Medications    Prior to Admission medications   Medication Sig Start Date End Date Taking? Authorizing Provider  acetaminophen (TYLENOL) 500 MG tablet Take 1,000 mg by mouth daily as needed for mild pain or headache.     [provider]  amiodarone (PACERONE) 200 MG tablet Take 1 tablet (200 mg total) by mouth daily. 11/08/18   Daune Perch, NP  atorvastatin (LIPITOR) 10 MG tablet TAKE 1 TABLET BY MOUTH DAILY AT 6 PM. 01/01/19   Jettie Booze, MD  furosemide (LASIX) 20 MG tablet Take 1 tablet (20 mg total) by mouth daily. 11/07/18   Daune Perch, NP  Glucosamine-Chondroitin (MOVE FREE PO) Take 1 tablet by mouth 2 (two) times daily.     [provider]  isosorbide mononitrate (IMDUR) 30 MG 24 hr tablet Take 1 tablet (30 mg total) by mouth daily. Patient not taking: Reported on 03/20/2019 09/28/18   Oswald Hillock, MD  Metoprolol Succinate 25 MG CS24 Take 25 mg by mouth daily. 03/22/19   Jettie Booze, MD  Multiple Vitamins-Minerals (PRESERVISION AREDS 2 PO) Take 1 capsule by mouth 2 (two) times daily.     [provider]  nitroGLYCERIN (NITROSTAT) 0.4 MG SL tablet Place 1 tablet (0.4 mg total) under the tongue every 5 (five) minutes as needed for chest pain. 11/17/17   Jettie Booze, MD  Omega-3 Fatty Acids (FISH OIL) 1000 MG CAPS Take 1,000 mg by mouth daily.     [provider]  sacubitril-valsartan (ENTRESTO) 24-26 MG Take 1 tablet by mouth 2 (two) times daily. 11/07/18   Daune Perch, NP  XARELTO 20 MG TABS tablet TAKE 1 TABLET (20 MG TOTAL) BY MOUTH DAILY WITH SUPPER. 03/15/18   Thompson Grayer, MD    Family History Family History  Problem Relation Age of Onset  . CAD Brother 53  . Stroke Brother   . Cancer Other   . Heart disease Other   . Heart attack Neg Hx   . Hypertension Neg Hx     Social History Social History   Tobacco Use  . Smoking status: Former Smoker    Packs/day: 1.00    Years: 26.00    Pack years: 26.00    Types: Cigarettes    Quit date: 10/31/1973    Years since quitting: 45.5  . Smokeless tobacco: Never Used  Substance Use Topics  . Alcohol use: No  . Drug use: No     Allergies   Penicillins and Hydromorphone hcl   Review of Systems Review of Systems  All systems reviewed and negative, other than as noted in HPI.  Physical Exam Updated Vital Signs BP 131/70   Pulse 64   Temp 98.3 F (36.8 C) (Oral)   Resp 13   Ht 5\' 8"  (1.727 m)   Wt 83.9 kg   SpO2 100%   BMI 28.13 kg/m   Physical Exam Vitals signs and nursing note reviewed.  Constitutional:      General: He is not in acute distress.    Appearance: He is well-developed.  HENT:     Head: Normocephalic and atraumatic.  Eyes:     General:        Right eye: No discharge.        Left eye: No discharge.     Conjunctiva/sclera: Conjunctivae normal.  Neck:     Musculoskeletal: Neck supple.  Cardiovascular:     Rate and Rhythm: Normal rate and regular rhythm.     Heart sounds: Normal heart sounds. No murmur. No friction rub. No gallop.   Pulmonary:     Effort: Pulmonary effort is normal. No respiratory distress.     Breath sounds: Normal breath sounds.  Abdominal:     General: There is no distension.     Palpations: Abdomen is soft.     Tenderness: There is no abdominal tenderness.  Musculoskeletal:     Comments: Right lower  extremity does not appear to be shortened or malrotated as compared to the left.  He does have severe pain with attempted range of motion the right hip.  Neurovascular intact distally.  Skin:    General: Skin is warm and dry.  Neurological:     Mental Status: He is alert.  Psychiatric:        Behavior: Behavior normal.        Thought Content: Thought content normal.      ED Treatments / Results  Labs (all labs ordered are listed, but only abnormal results are displayed) Labs Reviewed  CBC WITH DIFFERENTIAL/PLATELET - Abnormal; Notable for the following components:      Result Value   RBC 3.92 (*)    Hemoglobin 12.4 (*)    MCV 101.8 (*)    Neutro Abs 8.7 (*)    Lymphs Abs 0.6 (*)    All other components within normal limits  BASIC METABOLIC PANEL - Abnormal; Notable for the following components:   Creatinine, Ser 1.46 (*)    GFR calc non Af Amer 42 (*)    GFR calc Af Amer 49 (*)    All other components within normal limits  SARS CORONAVIRUS 2 (HOSPITAL ORDER, PERFORMED IN [facility])  PROTIME-INR  URINALYSIS, ROUTINE W REFLEX MICROSCOPIC    EKG EKG Interpretation  Date/Time:  Sunday April 28 2019 19:36:35 EDT Ventricular Rate:  60 PR Interval:    QRS Duration: 120 QT Interval:  447 QTC Calculation: 447 R Axis:   66 Text Interpretation:  Sinus or ectopic atrial rhythm Prolonged PR interval LVH with secondary repolarization abnormality Confirmed by Raeford RazorKohut, Keelee Yankey [phone]([id number]) on 04/28/2019 8:31:56 PM   Radiology Ct Head Wo Contrast  Result Date: 04/28/2019 CLINICAL DATA:  85 y/o M; fall with head trauma, minor, pt on anticoagulation. EXAM: CT HEAD WITHOUT CONTRAST TECHNIQUE: Contiguous axial images were obtained from the base of the skull through  the vertex without intravenous contrast. COMPARISON:  11/27/2012 CT head. FINDINGS: Brain: No evidence of acute infarction, hemorrhage, hydrocephalus, extra-axial collection or mass lesion/mass effect. Stable nonspecific  white matter hypodensities compatible with chronic microvascular ischemic changes and stable volume loss of the brain. Vascular: Calcific atherosclerosis of the internal carotid arteries and the vertebral arteries. No hyperdense vessel. Skull: Normal. Negative for fracture or focal lesion. Sinuses/Orbits: Included paranasal sinuses and left mastoid air cells are normally aerated. Right wall down mastoidectomy chronic postsurgical changes with stable fat attenuating soft tissue posterior mastoidectomy bowl. Other: Bilateral intra-ocular lens replacement. IMPRESSION: 1. No acute intracranial abnormality identified. 2. Stable chronic microvascular ischemic changes and parenchymal volume loss of the brain. Electronically Signed   By: Mitzi Hansen M.D.   On: 04/28/2019 20:17   Dg Chest Portable 1 View  Result Date: 04/28/2019 CLINICAL DATA:  Preoperative hip fracture EXAM: PORTABLE CHEST 1 VIEW COMPARISON:  10/06/2010 FINDINGS: Cardiomegaly with left chest multi lead pacer. Hiatal hernia. Both lungs are clear. The visualized skeletal structures are unremarkable. IMPRESSION: Cardiomegaly without acute abnormality of the lungs.  Hiatal hernia. Electronically Signed   By: Lauralyn Primes M.D.   On: 04/28/2019 19:12   Dg Hip Unilat  With Pelvis 2-3 Views Right  Result Date: 04/28/2019 CLINICAL DATA:  Fall, pain EXAM: DG HIP (WITH OR WITHOUT PELVIS) 2-3V RIGHT COMPARISON:  None. FINDINGS: There is an impacted subcapital fracture of the right femoral neck. No other radiographic evidence of fracture of the pelvis or proximal left femur. Osteopenia. IMPRESSION: There is an impacted subcapital fracture of the right femoral neck. No other radiographic evidence of fracture of the pelvis or proximal left femur. Osteopenia. Electronically Signed   By: Lauralyn Primes M.D.   On: 04/28/2019 18:34    Procedures Procedures (including critical care time)  Medications Ordered in ED Medications - No data to  display   Initial Impression / Assessment and Plan / ED Course  I have reviewed the triage vital signs and the nursing notes.  Pertinent labs & imaging results that were available during my care of the patient were reviewed by me and considered in my medical decision making (see chart for details).        85 year old male with right hip pain after mechanical fall.  Right hip fracture.  Initially discussed with Dr. Magnus Ivan, on-call orthopedic surgeon.  Patient's had previous surgery by Dr. Lequita Halt though.  Will contact [facility].  Discussed the case with Dr. Selena Batten, hospitalist service for admission.  9:19 PM Spoke with Dr Charlann Boxer. Dr Lequita Halt will be available to see pt in the morning.   Final Clinical Impressions(s) / ED Diagnoses   Final diagnoses:  Closed fracture of right hip, initial encounter [facility])    ED Discharge Orders    None       Raeford Razor, MD 04/28/19 2051    Raeford Razor, MD 04/28/19 2119

## 2019-04-28 NOTE — H&P (Addendum)
TRH H&P    Patient Demographics:    Gregory Mccormick, is a 85 y.o. male  MRN: [id number]  DOB - 30-Aug-1929  Admit Date - 04/28/2019  Referring MD/NP/PA: Raeford RazorStephen Kohut  Outpatient Primary MD for the patient is Tally JoeSwayne, David, MD  Patient coming from: home  Chief complaint- fall   HPI:    Gregory HarpsJack Lumm  is a 85 y.o. male,w hypertension, hyperlipidemia, h/o mobitz type 2, s/p pacer, chronic diastolic CHF,Pafib, who presents with mechanical fall, and c/o right hip pain.  Pt denies syncope, cp, palp, sob, n/v, abd pain, diarrhea, brbpr, dysuria.    In ED,  T 98.3, P 58  R 14  Bp 141/60  Pox 97% Wt 83.9 kg  Xray R hip IMPRESSION: There is an impacted subcapital fracture of the right femoral neck. No other radiographic evidence of fracture of the pelvis or proximal left femur. Osteopenia.  CXR IMPRESSION: Cardiomegaly without acute abnormality of the lungs.  Hiatal hernia.  CT brain IMPRESSION: 1. No acute intracranial abnormality identified. 2. Stable chronic microvascular ischemic changes and parenchymal volume loss of the brain.  Wbc 9.8, Hgb 12.4, Plt 162 Na 143, K 4.3, Bun 22, Creatinine 1.46 Glucose 94  ekg nsr at 60, nl axis, nl int,  Poor R progression, + lvh,   Pt given lorazepam and morphine iv in ED  ED consulted orthopedics,   Pt will be admitted for evaluation of right hip fracture.      Review of systems:    In addition to the HPI above,  No Fever-chills, No Headache, No changes with Vision or hearing, No problems swallowing food or Liquids, No Chest pain, Cough or Shortness of Breath, No Abdominal pain, No Nausea or Vomiting, bowel movements are regular, No Blood in stool or Urine, No dysuria, No new skin rashes or bruises,  No new weakness, tingling, numbness in any extremity, No recent weight gain or loss, No polyuria, polydypsia or polyphagia, No significant  Mental Stressors.  All other systems reviewed and are negative.    Past History of the following :    Past Medical History:  Diagnosis Date  . Anemia, unspecified   . Aortic stenosis   . Arthritis    "right knee" (03/28/2013  . CAD in native artery    takes Xarelto daily  . Cardiomyopathy (HCC)    EF 40%:  ECHO 11/25/2015 EF 55%-60%  . Carpal tunnel syndrome, bilateral   . Chronic systolic CHF (congestive heart failure) (HCC)   . Epistaxis   . Essential hypertension, benign    takes Metoprolol daily  . H/O hiatal hernia   . History of kidney stones   . Hyperlipidemia    takes Lipitor daily  . Nonspecific abnormal unspecified cardiovascular function study   . OSA on CPAP    wears CPAP 02/03/16  . Pacemaker    MDT  . PAF (paroxysmal atrial fibrillation) (HCC)   . Paroxysmal ventricular tachycardia (HCC)   . PONV (postoperative nausea and vomiting)   . Vitamin D deficiency  Past Surgical History:  Procedure Laterality Date  . CARDIAC CATHETERIZATION  03/21/2013  . CARDIOVERSION N/A 10/09/2018   Procedure: CARDIOVERSION;  Surgeon: Josue Hector, MD;  Location: [facility] ENDOSCOPY;  Service: Cardiovascular;  Laterality: N/A;  . CARPAL TUNNEL RELEASE Right 02/05/2016   Procedure: RIGHT LIMITED OPEN CARPAL TUNNEL RELEASE;  Surgeon: Roseanne Kaufman, MD;  Location: [place];  Service: Orthopedics;  Laterality: Right;  . CARPAL TUNNEL RELEASE Left 04/21/2016   Procedure: CARPAL TUNNEL RELEASE;  Surgeon: Roseanne Kaufman, MD;  Location: [place];  Service: Orthopedics;  Laterality: Left;  . CATARACT EXTRACTION W/ INTRAOCULAR LENS  IMPLANT, BILATERAL Bilateral 2000's  . COLONOSCOPY    . CORONARY ANGIOPLASTY WITH STENT PLACEMENT  03/28/2013   "3 stents" (03/28/2013)  . INSERT / REPLACE / [place] Left 1972   "cut it open" (03/28/2013)  . KNEE CARTILAGE SURGERY Right ~ 1977   "cut it open" (03/28/2013)  . PACEMAKER INSERTION  10/05/10   MDT Adapta L implanted  by Dr Rayann Heman for mobitz II second degree AV block  . PERCUTANEOUS CORONARY ROTOBLATOR INTERVENTION (PCI-R) N/A 03/28/2013   Procedure: PERCUTANEOUS CORONARY ROTOBLATOR INTERVENTION (PCI-R);  Surgeon: Jettie Booze, MD;  Location[facility] C[facility] LAB;  Service: Cardiovascular;  Laterality: N/A;  . SEPTOPLASTY N/A 02/05/2016   Procedure: SEPTOPLASTY WITH INTRANASAL SKIN GRAFT;  Surgeon: Rozetta Nunnery, MD;  Location: [place];  Service: ENT;  Laterality: N/A;  . SHOULDER HEMI-ARTHROPLASTY Right 1987   "got hit by sled & dragged down sheet > 200 feet; tore shoulder up" (03/28/2013)  . SHOULDER SURGERY Right 1970's   "pulled muscle apart; sewed it back together" (03/28/2013  . TOTAL KNEE ARTHROPLASTY Right 2007  . TOTAL KNEE ARTHROPLASTY  11/26/2012   Procedure: TOTAL KNEE ARTHROPLASTY;  Surgeon: Gearlean Alf, MD;  Location: [facility];  Service: Orthopedics;  Laterality: Left;  . TRANSURETHRAL RESECTION OF PROSTATE N/A 12/27/2017   Procedure: TRANSURETHRAL RESECTION OF THE PROSTATE (TURP);  Surgeon: Lucas Mallow, MD;  Location: [facility];  Service: Urology;  Laterality: N/A;      Social History:      Social History   Tobacco Use  . Smoking status: Former Smoker    Packs/day: 1.00    Years: 26.00    Pack years: 26.00    Types: Cigarettes    Quit date: 10/31/1973    Years since quitting: 45.5  . Smokeless tobacco: Never Used  Substance Use Topics  . Alcohol use: No       Family History :     Family History  Problem Relation Age of Onset  . CAD Brother 70  . Stroke Brother   . Cancer Other   . Heart disease Other   . Heart attack Neg Hx   . Hypertension Neg Hx       Home Medications:   Prior to Admission medications   Medication Sig Start Date End Date Taking? Authorizing Provider  acetaminophen (TYLENOL) 500 MG tablet Take 1,000 mg by mouth daily as needed for mild pain or headache.    Yes [provider]  amiodarone (PACERONE) 200 MG tablet  Take 1 tablet (200 mg total) by mouth daily. 11/08/18  Yes Daune Perch, NP  atorvastatin (LIPITOR) 10 MG tablet TAKE 1 TABLET BY MOUTH DAILY AT 6 PM. 01/01/19  Yes Jettie Booze, MD  furosemide (LASIX) 20 MG tablet Take 1 tablet (20 mg total) by mouth daily. 11/07/18  Yes Berton Bon, NP  Metoprolol Succinate 25 MG CS24 Take 25 mg by mouth daily. 03/22/19  Yes Corky Crafts, MD  Multiple Vitamins-Minerals (PRESERVISION AREDS 2 PO) Take 1 capsule by mouth 2 (two) times daily.    Yes [provider]  nitroGLYCERIN (NITROSTAT) 0.4 MG SL tablet Place 1 tablet (0.4 mg total) under the tongue every 5 (five) minutes as needed for chest pain. 11/17/17  Yes Corky Crafts, MD  Omega-3 Fatty Acids (FISH OIL) 1000 MG CAPS Take 1,000 mg by mouth daily.   Yes [provider]  sacubitril-valsartan (ENTRESTO) 24-26 MG Take 1 tablet by mouth 2 (two) times daily. 11/07/18  Yes Berton Bon, NP  XARELTO 20 MG TABS tablet TAKE 1 TABLET (20 MG TOTAL) BY MOUTH DAILY WITH SUPPER. 03/15/18  Yes Hillis Range, MD     Allergies:     Allergies  Allergen Reactions  . Penicillins Hives    Has patient had a PCN reaction causing immediate rash, facial/tongue/throat swelling, SOB or lightheadedness with hypotension: YES Has patient had a PCN reaction causing severe rash involving mucus membranes or skin necrosis: No Has patient had a PCN reaction that required hospitalization No Has patient had a PCN reaction occurring within the last 10 years: No If all of the above answers are "NO", then may proceed with Cephalosporin use.   Marland Kitchen Hydromorphone Hcl Nausea And Vomiting     Physical Exam:   Vitals  Blood pressure (!) 125/96, pulse 63, temperature 98.3 F (36.8 C), temperature source Oral, resp. rate 13, height  (1.727 m), weight 83.9 kg, SpO2 97 %.  1.  General: axoxo3  2. Psychiatric: euthymic  3. Neurologic: cn2-12 intact, reflexes 2+ symmetric, diffuse with no  clonus, motor 5/5 in all 4 ext  4. HEENMT:  Anicteric, pupils 1.69mm symmetric, direct, consensual, near intact Neck: no jvd  5. Respiratory : CTAB  6. Cardiovascular : rrr s1, s2, 2/6 sem rusb  7. Gastrointestinal:  Abd: soft, nt, nd, +bs  8. Skin:  Ext: no c/c/e, no rash  9.Musculoskeletal:  Good ROM  No adenopathy    Data Review:    CBC Recent Labs  Lab 04/28/19 1952  WBC 9.8  HGB 12.4*  HCT 39.9  PLT 162  MCV 101.8*  MCH 31.6  MCHC 31.1  RDW 14.2  LYMPHSABS 0.6*  MONOABS 0.5  EOSABS 0.1  BASOSABS 0.0   ------------------------------------------------------------------------------------------------------------------  Results for orders placed or performed during the hospital encounter of 04/28/19 (from the past 48 hour(s))  CBC with Differential     Status: Abnormal   Collection Time: 04/28/19  7:52 PM  Result Value Ref Range   WBC 9.8 4.0 - 10.5 K/uL   RBC 3.92 (L) 4.22 - 5.81 MIL/uL   Hemoglobin 12.4 (L) 13.0 - 17.0 g/dL   HCT 09.8 11.9 - 14.7 %   MCV 101.8 (H) 80.0 - 100.0 fL   MCH 31.6 26.0 - 34.0 pg   MCHC 31.1 30.0 - 36.0 g/dL   RDW 82.9 56.2 - 13.0 %   Platelets 162 150 - 400 K/uL   nRBC 0.0 0.0 - 0.2 %   Neutrophils Relative % 87 %   Neutro Abs 8.7 (H) 1.7 - 7.7 K/uL   Lymphocytes Relative 6 %   Lymphs Abs 0.6 (L) 0.7 - 4.0 K/uL   Monocytes Relative 5 %   Monocytes Absolute 0.5 0.1 - 1.0 K/uL   Eosinophils Relative 1 %   Eosinophils Absolute 0.1 0.0 -  0.5 K/uL   Basophils Relative 0 %   Basophils Absolute 0.0 0.0 - 0.1 K/uL   Immature Granulocytes 1 %   Abs Immature Granulocytes 0.05 0.00 - 0.07 K/uL    Comment: Performed at [facility], [address] [place], [place] [address]  Basic metabolic panel     Status: Abnormal   Collection Time: 04/28/19  7:52 PM  Result Value Ref Range   Sodium 143 135 - 145 mmol/L   Potassium 4.3 3.5 - 5.1 mmol/L   Chloride 106 98 - 111 mmol/L   CO2 27 22 - 32 mmol/L   Glucose,  Bld 94 70 - 99 mg/dL   BUN 22 8 - 23 mg/dL   Creatinine, Ser 6.041.46 (H) 0.61 - 1.24 mg/dL   Calcium 8.9 8.9 - 54.010.3 mg/dL   GFR calc non Af Amer 42 (L) >60 mL/min   GFR calc Af Amer 49 (L) >60 mL/min   Anion gap 10 5 - 15    Comment: Performed at [facility], [address] [place], [place] [address]  SARS Coronavirus 2 (CEPHEID - Performed in [facility] hospital lab), Hosp Order     Status: None   Collection Time: 04/28/19  7:52 PM   Specimen: Nasopharyngeal Swab  Result Value Ref Range   SARS Coronavirus 2 NEGATIVE NEGATIVE    Comment: (NOTE) If result is NEGATIVE SARS-CoV-2 target nucleic acids are NOT DETECTED. The SARS-CoV-2 RNA is generally detectable in upper and lower  respiratory specimens during the acute phase of infection. The lowest  concentration of SARS-CoV-2 viral copies this assay can detect is 250  copies / mL. A negative result does not preclude SARS-CoV-2 infection  and should not be used as the sole basis for treatment or other  patient management decisions.  A negative result may occur with  improper specimen collection / handling, submission of specimen other  than nasopharyngeal swab, presence of viral mutation(s) within the  areas targeted by this assay, and inadequate number of viral copies  (<250 copies / mL). A negative result must be combined with clinical  observations, patient history, and epidemiological information. If result is POSITIVE SARS-CoV-2 target nucleic acids are DETECTED. The SARS-CoV-2 RNA is generally detectable in upper and lower  respiratory specimens dur ing the acute phase of infection.  Positive  results are indicative of active infection with SARS-CoV-2.  Clinical  correlation with patient history and other diagnostic information is  necessary to determine patient infection status.  Positive results do  not rule out bacterial infection or co-infection with other viruses. If result is PRESUMPTIVE  POSTIVE SARS-CoV-2 nucleic acids MAY BE PRESENT.   A presumptive positive result was obtained on the submitted specimen  and confirmed on repeat testing.  While 2019 novel coronavirus  (SARS-CoV-2) nucleic acids may be present in the submitted sample  additional confirmatory testing may be necessary for epidemiological  and / or clinical management purposes  to differentiate between  SARS-CoV-2 and other Sarbecovirus currently known to infect humans.  If clinically indicated additional testing with an alternate test  methodology [phone]([id number]) is advised. The SARS-CoV-2 RNA is generally  detectable in upper and lower respiratory sp ecimens during the acute  phase of infection. The expected result is Negative. Fact Sheet for Patients:  BoilerBrush.com.cyhttps://www.fda.gov/media/136312/download Fact Sheet for Healthcare Providers: https://pope.com/https://www.fda.gov/media/136313/download This test is not yet approved or cleared by the [place] FDA and has been authorized for detection and/or diagnosis of SARS-CoV-2 by FDA under  an Emergency Use Authorization (EUA).  This EUA will remain in effect (meaning this test can be used) for the duration of the COVID-19 declaration under Section 564(b)(1) of the Act, 21 U.S.C. section 360bbb-3(b)(1), unless the authorization is terminated or revoked sooner. Performed at [facility], [address] [place], [place] [address]     Chemistries  Recent Labs  Lab 04/28/19 1952  NA 143  K 4.3  CL 106  CO2 27  GLUCOSE 94  BUN 22  CREATININE 1.46*  CALCIUM 8.9   ------------------------------------------------------------------------------------------------------------------  ------------------------------------------------------------------------------------------------------------------ GFR: Estimated Creatinine Clearance: 36.9 mL/min (A) (by C-G formula based on SCr of 1.46 mg/dL (H)). Liver Function Tests: No results for input(s): AST, ALT,  ALKPHOS, BILITOT, PROT, ALBUMIN in the last 168 hours. No results for input(s): LIPASE, AMYLASE in the last 168 hours. No results for input(s): AMMONIA in the last 168 hours. Coagulation Profile: No results for input(s): INR, PROTIME in the last 168 hours. Cardiac Enzymes: No results for input(s): CKTOTAL, CKMB, CKMBINDEX, TROPONINI in the last 168 hours. BNP (last 3 results) No results for input(s): PROBNP in the last 8760 hours. HbA1C: No results for input(s): HGBA1C in the last 72 hours. CBG: No results for input(s): GLUCAP in the last 168 hours. Lipid Profile: No results for input(s): CHOL, HDL, LDLCALC, TRIG, CHOLHDL, LDLDIRECT in the last 72 hours. Thyroid Function Tests: No results for input(s): TSH, T4TOTAL, FREET4, T3FREE, THYROIDAB in the last 72 hours. Anemia Panel: No results for input(s): VITAMINB12, FOLATE, FERRITIN, TIBC, IRON, RETICCTPCT in the last 72 hours.  --------------------------------------------------------------------------------------------------------------- Urine analysis:    Component Value Date/Time   COLORURINE YELLOW 11/20/2012 1324   APPEARANCEUR CLEAR 11/20/2012 1324   LABSPEC 1.018 11/20/2012 1324   PHURINE 6.0 11/20/2012 1324   GLUCOSEU NEGATIVE 11/20/2012 1324   HGBUR NEGATIVE 11/20/2012 1324   BILIRUBINUR NEGATIVE 11/20/2012 1324   KETONESUR NEGATIVE 11/20/2012 1324   PROTEINUR NEGATIVE 11/20/2012 1324   UROBILINOGEN 0.2 11/20/2012 1324   NITRITE NEGATIVE 11/20/2012 1324   LEUKOCYTESUR NEGATIVE 11/20/2012 1324      Imaging Results:    Ct Head Wo Contrast  Result Date: 04/28/2019 CLINICAL DATA:  85 y/o M; fall with head trauma, minor, pt on anticoagulation. EXAM: CT HEAD WITHOUT CONTRAST TECHNIQUE: Contiguous axial images were obtained from the base of the skull through the vertex without intravenous contrast. COMPARISON:  11/27/2012 CT head. FINDINGS: Brain: No evidence of acute infarction, hemorrhage, hydrocephalus, extra-axial  collection or mass lesion/mass effect. Stable nonspecific white matter hypodensities compatible with chronic microvascular ischemic changes and stable volume loss of the brain. Vascular: Calcific atherosclerosis of the internal carotid arteries and the vertebral arteries. No hyperdense vessel. Skull: Normal. Negative for fracture or focal lesion. Sinuses/Orbits: Included paranasal sinuses and left mastoid air cells are normally aerated. Right wall down mastoidectomy chronic postsurgical changes with stable fat attenuating soft tissue posterior mastoidectomy bowl. Other: Bilateral intra-ocular lens replacement. IMPRESSION: 1. No acute intracranial abnormality identified. 2. Stable chronic microvascular ischemic changes and parenchymal volume loss of the brain. Electronically Signed   By: Mitzi HansenLance  Furusawa-Stratton M.D.   On: 04/28/2019 20:17   Dg Chest Portable 1 View  Result Date: 04/28/2019 CLINICAL DATA:  Preoperative hip fracture EXAM: PORTABLE CHEST 1 VIEW COMPARISON:  10/06/2010 FINDINGS: Cardiomegaly with left chest multi lead pacer. Hiatal hernia. Both lungs are clear. The visualized skeletal structures are unremarkable. IMPRESSION: Cardiomegaly without acute abnormality of the lungs.  Hiatal hernia. Electronically Signed   By: Erasmo ScoreAlex  Bibbey M.D.  On: 04/28/2019 19:12   Dg Hip Unilat  With Pelvis 2-3 Views Right  Result Date: 04/28/2019 CLINICAL DATA:  Fall, pain EXAM: DG HIP (WITH OR WITHOUT PELVIS) 2-3V RIGHT COMPARISON:  None. FINDINGS: There is an impacted subcapital fracture of the right femoral neck. No other radiographic evidence of fracture of the pelvis or proximal left femur. Osteopenia. IMPRESSION: There is an impacted subcapital fracture of the right femoral neck. No other radiographic evidence of fracture of the pelvis or proximal left femur. Osteopenia. Electronically Signed   By: Lauralyn Primes M.D.   On: 04/28/2019 18:34       Assessment & Plan:    Principal Problem:   Fracture of  femoral neck, right, closed (HCC) Active Problems:   Essential hypertension, benign   Hyperlipidemia   Atrial fibrillation (HCC)   BPH (benign prostatic hyperplasia)   OSA on CPAP   Chronic diastolic (congestive) heart failure (HCC)   Closed right hip fracture (HCC)   Chronic systolic CHF (congestive heart failure) (HCC)  R femoral neck fracture NPO after midnite (not sure about timing of surgery due to Xarelto) Morphine 1mg  iv q4h prn  PT/OT to evaluate Pt requested [facility], Alusio has done his knees in the past Orthopedics consulted by ED, appreciate input   Pafib STOP Xarelto Cont Amiodarone 200mg  po qday Cont Toprol XL 25mg  po qday Check cbc in am  Chronic systolic CHF (EF 62-13%),  Moderate Aortic stenosis Mild Mitral regurgitation Cont Lasix 20mg  po qday Cont Toprol XL 25mg  po qday Cont Entresto 24/26mg  po qday Check cmp in am  Hyperlipidemia Cont Lipitor 10mg  po qday  Hx of Mobitz type 2/ s/p pacer Stable  Glaucoma Cont home eye drops  OSA,  pt states not using CPAP Might need outpatient follow up  Hypothyroidism TSH 8.690  (11/08/2018) Check TSH  DVT Prophylaxis-   SCD  AM Labs Ordered, also please review Full Orders  Family Communication: Admission, patients condition and plan of care including tests being ordered have been discussed with the patient  who indicate understanding and agree with the plan and Code Status.  Code Status:  FULL CODE , notified the son regarding pt's admission after getting permission from patient  Admission status: Inpatient: Based on patients clinical presentation and evaluation of above clinical data, I have made determination that patient meets Inpatient criteria at this time. Pt has high risk of clinical deterioration, without intervention for right hip fracture.  Pt will require > 2 nites stay and will  Require inpatient stay.   Time spent in minutes : 70   Pearson Grippe M.D on 04/28/2019 at 10:11 PM

## 2019-04-28 NOTE — ED Notes (Signed)
Patient's son requesting an update. [phone]

## 2019-04-28 NOTE — ED Notes (Signed)
Bed: WA24 Expected date:  Expected time:  Means of arrival:  Comments: Hold room 24 Fall, hip and leg pain

## 2019-04-28 NOTE — ED Triage Notes (Signed)
Pt is coming from home. Pt had a fall, denies hitting head or any LOC. EMS stated that shortening of the right leg when placed on stretcher. No shortening noted on arrival to hospital. Pt states 10/10 pain in right hip. Pt does take blood thinners.

## 2019-04-28 NOTE — Progress Notes (Signed)
Patient ID: Gregory Mccormick, male   DOB: [birth year], 85 y.o.   MRN: [id number]  Patient with right femoral neck fracture Admit to medicine Hold Eliquis for potential surgery within next 1-3 days NPO for now until surgery deemed safe Likely will need Hemiarthroplasty  Full consult to follow

## 2019-04-29 ENCOUNTER — Other Ambulatory Visit: Payer: Self-pay

## 2019-04-29 ENCOUNTER — Other Ambulatory Visit: Payer: Self-pay | Admitting: Internal Medicine

## 2019-04-29 DIAGNOSIS — I5022 Chronic systolic (congestive) heart failure: Secondary | ICD-10-CM

## 2019-04-29 DIAGNOSIS — Z9989 Dependence on other enabling machines and devices: Secondary | ICD-10-CM

## 2019-04-29 DIAGNOSIS — G4733 Obstructive sleep apnea (adult) (pediatric): Secondary | ICD-10-CM

## 2019-04-29 DIAGNOSIS — I350 Nonrheumatic aortic (valve) stenosis: Secondary | ICD-10-CM

## 2019-04-29 DIAGNOSIS — N400 Enlarged prostate without lower urinary tract symptoms: Secondary | ICD-10-CM

## 2019-04-29 LAB — CBC
HCT: 36.6 % — ABNORMAL LOW (ref 39.0–52.0)
Hemoglobin: 11.2 g/dL — ABNORMAL LOW (ref 13.0–17.0)
MCH: 31.3 pg (ref 26.0–34.0)
MCHC: 30.6 g/dL (ref 30.0–36.0)
MCV: 102.2 fL — ABNORMAL HIGH (ref 80.0–100.0)
Platelets: 129 10*3/uL — ABNORMAL LOW (ref 150–400)
RBC: 3.58 MIL/uL — ABNORMAL LOW (ref 4.22–5.81)
RDW: 14.1 % (ref 11.5–15.5)
WBC: 7.3 10*3/uL (ref 4.0–10.5)
nRBC: 0 % (ref 0.0–0.2)

## 2019-04-29 LAB — COMPREHENSIVE METABOLIC PANEL
ALT: 20 U/L (ref 0–44)
AST: 21 U/L (ref 15–41)
Albumin: 3.2 g/dL — ABNORMAL LOW (ref 3.5–5.0)
Alkaline Phosphatase: 53 U/L (ref 38–126)
Anion gap: 8 (ref 5–15)
BUN: 20 mg/dL (ref 8–23)
CO2: 24 mmol/L (ref 22–32)
Calcium: 8.2 mg/dL — ABNORMAL LOW (ref 8.9–10.3)
Chloride: 108 mmol/L (ref 98–111)
Creatinine, Ser: 1.16 mg/dL (ref 0.61–1.24)
GFR calc Af Amer: >60 mL/min (ref >60)
GFR calc non Af Amer: 56 mL/min — ABNORMAL LOW (ref >60)
Glucose, Bld: 96 mg/dL (ref 70–99)
Potassium: 4.1 mmol/L (ref 3.5–5.1)
Sodium: 140 mmol/L (ref 135–145)
Total Bilirubin: 1.1 mg/dL (ref 0.3–1.2)
Total Protein: 5.5 g/dL — ABNORMAL LOW (ref 6.5–8.1)

## 2019-04-29 LAB — URINALYSIS, ROUTINE W REFLEX MICROSCOPIC
Bilirubin Urine: NEGATIVE
Glucose, UA: NEGATIVE mg/dL
Hgb urine dipstick: NEGATIVE
Ketones, ur: 5 mg/dL — AB
Leukocytes,Ua: NEGATIVE
Nitrite: NEGATIVE
Protein, ur: NEGATIVE mg/dL
Specific Gravity, Urine: 1.016 (ref 1.005–1.030)
pH: 7 (ref 5.0–8.0)

## 2019-04-29 LAB — TSH: TSH: 3.504 u[IU]/mL (ref 0.350–4.500)

## 2019-04-29 LAB — MRSA PCR SCREENING: MRSA by PCR: NEGATIVE

## 2019-04-29 MED ORDER — TAMSULOSIN HCL 0.4 MG PO CAPS
0.4000 mg | ORAL_CAPSULE | Freq: Every day | ORAL | Status: DC
  Administered 2019-04-29 – 2019-05-02 (×3): 0.4 mg via ORAL
  Filled 2019-04-29 (×4): qty 1

## 2019-04-29 MED ORDER — NITROGLYCERIN 0.4 MG SL SUBL: 0.4000 mg | SUBLINGUAL_TABLET | SUBLINGUAL | Status: DC | PRN

## 2019-04-29 MED ORDER — POVIDONE-IODINE 10 % EX SWAB: 2.0000 "application " | Freq: Once | CUTANEOUS | Status: DC

## 2019-04-29 MED ORDER — FISH OIL 1000 MG PO CAPS: 1000.0000 mg | ORAL_CAPSULE | Freq: Every day | ORAL | Status: DC

## 2019-04-29 MED ORDER — CEFAZOLIN SODIUM-DEXTROSE 2-4 GM/100ML-% IV SOLN
2.0000 g | INTRAVENOUS | Status: AC
  Administered 2019-04-30: 2 g via INTRAVENOUS
  Filled 2019-04-29 (×2): qty 100

## 2019-04-29 MED ORDER — TRANEXAMIC ACID-NACL 1000-0.7 MG/100ML-% IV SOLN
1000.0000 mg | INTRAVENOUS | Status: AC
  Administered 2019-04-30: 1000 mg via INTRAVENOUS
  Filled 2019-04-29: qty 100

## 2019-04-29 MED ORDER — SODIUM CHLORIDE 0.9 % IV SOLN
INTRAVENOUS | Status: DC
  Administered 2019-04-29 (×2): via INTRAVENOUS

## 2019-04-29 MED ORDER — ATORVASTATIN CALCIUM 10 MG PO TABS
10.0000 mg | ORAL_TABLET | Freq: Every day | ORAL | Status: DC
  Administered 2019-05-01: 10 mg via ORAL
  Filled 2019-04-29: qty 1

## 2019-04-29 MED ORDER — CHLORHEXIDINE GLUCONATE 4 % EX LIQD
60.0000 mL | Freq: Once | CUTANEOUS | Status: DC
  Filled 2019-04-29: qty 60

## 2019-04-29 MED ORDER — FUROSEMIDE 20 MG PO TABS
20.0000 mg | ORAL_TABLET | Freq: Every day | ORAL | Status: DC
  Administered 2019-04-29 – 2019-04-30 (×2): 20 mg via ORAL
  Filled 2019-04-29 (×2): qty 1

## 2019-04-29 MED ORDER — METOPROLOL SUCCINATE ER 25 MG PO TB24
25.0000 mg | ORAL_TABLET | Freq: Every day | ORAL | Status: DC
  Administered 2019-04-29 – 2019-04-30 (×2): 25 mg via ORAL
  Filled 2019-04-29 (×2): qty 1

## 2019-04-29 MED ORDER — AMIODARONE HCL 200 MG PO TABS
200.0000 mg | ORAL_TABLET | Freq: Every day | ORAL | Status: DC
  Administered 2019-04-29 – 2019-05-02 (×4): 200 mg via ORAL
  Filled 2019-04-29 (×4): qty 1

## 2019-04-29 MED ORDER — OMEGA-3-ACID ETHYL ESTERS 1 G PO CAPS
1.0000 g | ORAL_CAPSULE | Freq: Every day | ORAL | Status: DC
  Administered 2019-04-29: 1 g via ORAL
  Filled 2019-04-29 (×2): qty 1

## 2019-04-29 MED ORDER — ENSURE PRE-SURGERY PO LIQD
296.0000 mL | Freq: Once | ORAL | Status: AC
  Administered 2019-04-29: 296 mL via ORAL
  Filled 2019-04-29: qty 296

## 2019-04-29 MED ORDER — MORPHINE SULFATE (PF) 2 MG/ML IV SOLN
1.0000 mg | INTRAVENOUS | Status: DC | PRN
  Administered 2019-04-29: 2 mg via INTRAVENOUS
  Administered 2019-04-29: 1 mg via INTRAVENOUS
  Administered 2019-04-29 (×2): 2 mg via INTRAVENOUS
  Administered 2019-04-30: 1 mg via INTRAVENOUS
  Filled 2019-04-29 (×7): qty 1

## 2019-04-29 MED ORDER — SACUBITRIL-VALSARTAN 24-26 MG PO TABS
1.0000 | ORAL_TABLET | Freq: Two times a day (BID) | ORAL | Status: DC
  Administered 2019-04-29 – 2019-05-02 (×6): 1 via ORAL
  Filled 2019-04-29 (×7): qty 1

## 2019-04-29 NOTE — Progress Notes (Signed)
PT Cancellation Note  Patient Details Name: Gregory Mccormick MRN: [id number] DOB: 1929/10/01   Cancelled Treatment:    Reason Eval/Treat Not Completed: Other (comment). Received PT orders to start tomorrow 6/30. Noted pt scheduled for ortho surgery tomorrow.  Will check back for post-op orders prior to completing PT eval.   Galen Manila 04/29/2019, 10:28 AM

## 2019-04-29 NOTE — Progress Notes (Signed)
Pt stated he hasn't voided since leaving home (before coming to the ED). He states he hasn't had any problems with voiding since he had his TURP in Feb 2019. Encouraged to try to void multiple times. When unable, bladder scan showed 582cc at 0444. Paged Dr Silas [place] and received order for foley cath. Successful insertion attempt followed by 650cc.

## 2019-04-29 NOTE — Progress Notes (Signed)
Spoke with patient about wearing CPAP machine to night. Patient stated he has not worn one in 4-5 years. RT asked patient if he would like to try and wear one tonight and patient declined. Patient encouraged to call if he changed his mind. RT will continue to monitor patient as needed.

## 2019-04-29 NOTE — Telephone Encounter (Signed)
Prescription refill request for Xarelto received.   Last office visit: [place] (11-20-2018) Weight: 84.7kg (11-20-2018) Age: 85 y.o. Scr: 1.16 (04-29-2019) CrCl:53 ml/min  Prescription refill sent.

## 2019-04-29 NOTE — Consult Note (Signed)
ORTHOPAEDIC CONSULTATION  REQUESTING PHYSICIAN: Rodolph Bong, MD  PCP:  Tally Joe, MD  Chief Complaint: right hip pain  HPI: Gregory Mccormick is a 85 y.o. male who complains of right hip pain. He states he was walking in his house, and didn't clear his toe over a riser and fell onto his right side. He was subsequently transported to [facility] via EMS. He reports pain in his right hip at 10/10. He also complains of pain in his right shoulder and left thigh as well. Denies head injury or LOC. Patient is hard of hearing.  Past Medical History:  Diagnosis Date  . Anemia, unspecified   . Aortic stenosis   . Arthritis    "right knee" (03/28/2013  . CAD in native artery    takes Xarelto daily  . Cardiomyopathy (HCC)    EF 40%:  ECHO 11/25/2015 EF 55%-60%  . Carpal tunnel syndrome, bilateral   . Chronic systolic CHF (congestive heart failure) (HCC)   . Epistaxis   . Essential hypertension, benign    takes Metoprolol daily  . H/O hiatal hernia   . History of kidney stones   . Hyperlipidemia    takes Lipitor daily  . Nonspecific abnormal unspecified cardiovascular function study   . OSA on CPAP    wears CPAP 02/03/16  . Pacemaker    MDT  . PAF (paroxysmal atrial fibrillation) (HCC)   . Paroxysmal ventricular tachycardia (HCC)   . PONV (postoperative nausea and vomiting)   . Vitamin D deficiency    Past Surgical History:  Procedure Laterality Date  . CARDIAC CATHETERIZATION  03/21/2013  . CARDIOVERSION N/A 10/09/2018   Procedure: CARDIOVERSION;  Surgeon: Wendall Stade, MD;  Location: [facility] ENDOSCOPY;  Service: Cardiovascular;  Laterality: N/A;  . CARPAL TUNNEL RELEASE Right 02/05/2016   Procedure: RIGHT LIMITED OPEN CARPAL TUNNEL RELEASE;  Surgeon: Dominica Severin, MD;  Location: [facility] OR;  Service: Orthopedics;  Laterality: Right;  . CARPAL TUNNEL RELEASE Left 04/21/2016   Procedure: CARPAL TUNNEL RELEASE;  Surgeon: Dominica Severin, MD;  Location: [facility] OR;  Service: Orthopedics;   Laterality: Left;  . CATARACT EXTRACTION W/ INTRAOCULAR LENS  IMPLANT, BILATERAL Bilateral 2000's  . COLONOSCOPY    . CORONARY ANGIOPLASTY WITH STENT PLACEMENT  03/28/2013   "3 stents" (03/28/2013)  . INSERT / REPLACE / REMOVE PACEMAKER    . KNEE CARTILAGE SURGERY Left 1972   "cut it open" (03/28/2013)  . KNEE CARTILAGE SURGERY Right ~ 1977   "cut it open" (03/28/2013)  . PACEMAKER INSERTION  10/05/10   MDT Adapta L implanted by Dr [name] for mobitz II second degree AV block  . PERCUTANEOUS CORONARY ROTOBLATOR INTERVENTION (PCI-R) N/A 03/28/2013   Procedure: PERCUTANEOUS CORONARY ROTOBLATOR INTERVENTION (PCI-R);  Surgeon: Corky Crafts, MD;  Location[facility] C[facility] LAB;  Service: Cardiovascular;  Laterality: N/A;  . SEPTOPLASTY N/A 02/05/2016   Procedure: SEPTOPLASTY WITH INTRANASAL SKIN GRAFT;  Surgeon: Drema Halon, MD;  Location: [facility] OR;  Service: ENT;  Laterality: N/A;  . SHOULDER HEMI-ARTHROPLASTY Right 1987   "got hit by sled & dragged down sheet > 200 feet; tore shoulder up" (03/28/2013)  . SHOULDER SURGERY Right 1970's   "pulled muscle apart; sewed it back together" (03/28/2013  . TOTAL KNEE ARTHROPLASTY Right 2007  . TOTAL KNEE ARTHROPLASTY  11/26/2012   Procedure: TOTAL KNEE ARTHROPLASTY;  Surgeon: Loanne Drilling, MD;  Location: [facility] ORS;  Service: Orthopedics;  Laterality: Left;  . TRANSURETHRAL RESECTION OF PROSTATE N/A  12/27/2017   Procedure: TRANSURETHRAL RESECTION OF THE PROSTATE (TURP);  Surgeon: Crista ElliotBell, Eugene D III, MD;  [facility];  Service: Urology;  Laterality: N/A;   Social History   Socioeconomic History  . Marital status: Married    Spouse name: Not on file  . Number of children: Not on file  . Years of education: Not on file  . Highest education level: Not on file  Occupational History  . Occupation: retired    [employer] [employer]  Social Needs  . Financial resource strain: Not on file  . Food insecurity    Worry: Not on file     Inability: Not on file  . Transportation needs    Medical: Not on file    Non-medical: Not on file  Tobacco Use  . Smoking status: Former Smoker    Packs/day: 1.00    Years: 26.00    Pack years: 26.00    Types: Cigarettes    Quit date: 10/31/1973    Years since quitting: 45.5  . Smokeless tobacco: Never Used  Substance and Sexual Activity  . Alcohol use: No  . Drug use: No  . Sexual activity: Never  Lifestyle  . Physical activity    Days per week: Not on file    Minutes per session: Not on file  . Stress: Not on file  Relationships  . Social Musicianconnections    Talks on phone: Not on file    Gets together: Not on file    Attends religious service: Not on file    Active member of club or organization: Not on file    Attends meetings of clubs or organizations: Not on file    Relationship status: Not on file  Other Topics Concern  . Not on file  Social History Narrative  . Not on file   Family History  Problem Relation Age of Onset  . CAD Brother 6975  . Stroke Brother   . Cancer Other   . Heart disease Other   . Heart attack Neg Hx   . Hypertension Neg Hx    Allergies  Allergen Reactions  . Penicillins Hives    Has patient had a PCN reaction causing immediate rash, facial/tongue/throat swelling, SOB or lightheadedness with hypotension: YES Has patient had a PCN reaction causing severe rash involving mucus membranes or skin necrosis: No Has patient had a PCN reaction that required hospitalization No Has patient had a PCN reaction occurring within the last 10 years: No If all of the above answers are "NO", then may proceed with Cephalosporin use.   Marland Kitchen. Hydromorphone Hcl Nausea And Vomiting   Prior to Admission medications   Medication Sig Start Date End Date Taking? Authorizing Provider  acetaminophen (TYLENOL) 500 MG tablet Take 1,000 mg by mouth daily as needed for mild pain or headache.    Yes [provider]  amiodarone (PACERONE) 200 MG tablet Take 1  tablet (200 mg total) by mouth daily. 11/08/18  Yes Berton BonHammond, Janine, NP  atorvastatin (LIPITOR) 10 MG tablet TAKE 1 TABLET BY MOUTH DAILY AT 6 PM. 01/01/19  Yes Corky CraftsVaranasi, Jayadeep S, MD  furosemide (LASIX) 20 MG tablet Take 1 tablet (20 mg total) by mouth daily. 11/07/18  Yes Berton BonHammond, Janine, NP  Metoprolol Succinate 25 MG CS24 Take 25 mg by mouth daily. 03/22/19  Yes Corky CraftsVaranasi, Jayadeep S, MD  Multiple Vitamins-Minerals (PRESERVISION AREDS 2 PO) Take 1 capsule by mouth 2 (two) times daily.    Yes [provider]  nitroGLYCERIN (NITROSTAT) 0.4 MG SL tablet Place 1 tablet (0.4 mg total) under the tongue every 5 (five) minutes as needed for chest pain. 11/17/17  Yes Jettie Booze, MD  Omega-3 Fatty Acids (FISH OIL) 1000 MG CAPS Take 1,000 mg by mouth daily.   Yes [provider]  sacubitril-valsartan (ENTRESTO) 24-26 MG Take 1 tablet by mouth 2 (two) times daily. 11/07/18  Yes Daune Perch, NP  XARELTO 20 MG TABS tablet TAKE 1 TABLET (20 MG TOTAL) BY MOUTH DAILY WITH SUPPER. 03/15/18  Yes Thompson Grayer, MD   Ct Head Wo Contrast  Result Date: 04/28/2019 CLINICAL DATA:  85 y/o M; fall with head trauma, minor, pt on anticoagulation. EXAM: CT HEAD WITHOUT CONTRAST TECHNIQUE: Contiguous axial images were obtained from the base of the skull through the vertex without intravenous contrast. COMPARISON:  11/27/2012 CT head. FINDINGS: Brain: No evidence of acute infarction, hemorrhage, hydrocephalus, extra-axial collection or mass lesion/mass effect. Stable nonspecific white matter hypodensities compatible with chronic microvascular ischemic changes and stable volume loss of the brain. Vascular: Calcific atherosclerosis of the internal carotid arteries and the vertebral arteries. No hyperdense vessel. Skull: Normal. Negative for fracture or focal lesion. Sinuses/Orbits: Included paranasal sinuses and left mastoid air cells are normally aerated. Right wall down mastoidectomy chronic postsurgical  changes with stable fat attenuating soft tissue posterior mastoidectomy bowl. Other: Bilateral intra-ocular lens replacement. IMPRESSION: 1. No acute intracranial abnormality identified. 2. Stable chronic microvascular ischemic changes and parenchymal volume loss of the brain. Electronically Signed   By: Kristine Garbe M.D.   On: 04/28/2019 20:17   Dg Chest Portable 1 View  Result Date: 04/28/2019 CLINICAL DATA:  Preoperative hip fracture EXAM: PORTABLE CHEST 1 VIEW COMPARISON:  10/06/2010 FINDINGS: Cardiomegaly with left chest multi lead pacer. Hiatal hernia. Both lungs are clear. The visualized skeletal structures are unremarkable. IMPRESSION: Cardiomegaly without acute abnormality of the lungs.  Hiatal hernia. Electronically Signed   By: Eddie Candle M.D.   On: 04/28/2019 19:12   Dg Hip Unilat  With Pelvis 2-3 Views Right  Result Date: 04/28/2019 CLINICAL DATA:  Fall, pain EXAM: DG HIP (WITH OR WITHOUT PELVIS) 2-3V RIGHT COMPARISON:  None. FINDINGS: There is an impacted subcapital fracture of the right femoral neck. No other radiographic evidence of fracture of the pelvis or proximal left femur. Osteopenia. IMPRESSION: There is an impacted subcapital fracture of the right femoral neck. No other radiographic evidence of fracture of the pelvis or proximal left femur. Osteopenia. Electronically Signed   By: Eddie Candle M.D.   On: 04/28/2019 18:34    Positive ROS: All other systems have been reviewed and were otherwise negative with the exception of those mentioned in the HPI and as above.  Physical Exam: General: Alert, no acute distress Cardiovascular: No pedal edema Respiratory: No cyanosis, no use of accessory musculature GI: No organomegaly, abdomen is soft and non-tender Skin: No lesions in the area of chief complaint Neurologic: Sensation intact distally Psychiatric: Patient is competent for consent with normal mood and affect Lymphatic: No axillary or cervical  lymphadenopathy  MUSCULOSKELETAL:  Right hip: Skin intact. No significant bruising. Pain with passive ROM. Neurovascularly intact to the RLE.   Assessment: #1 Mildly displaced right femoral neck fracture #2 On Xarelto for atrial fibrillation  Plan: Plan for right hemiarthroplasty tomorrow with Dr. Alvan Dame. Hold Xarelto. NPO after midnight.   Griffith Citron, PA-C Orthopedic Surgery [facility]      04/29/2019 8:28 AM

## 2019-04-29 NOTE — Progress Notes (Signed)
OT Cancellation Note  Patient Details Name: Gregory Mccormick MRN: [id number] DOB: 06/01/1929   Cancelled Treatment:    Reason Eval/Treat Not Completed: Other (comment)  Noted surgery for tomorrow.  Will await post op orders.  Kari Baars, OT Acute Rehabilitation Services [phone] Office- [phone]     Sukhmani Fetherolf, Edwena Felty D 04/29/2019, 1:16 PM

## 2019-04-29 NOTE — Progress Notes (Signed)
PROGRESS NOTE    [facility] [place]  HQP:[id number] DOB: [birth year] DOA: 04/28/2019 PCP: Tally Joe, MD    Brief Narrative:  HPI per Dr. Ernest Haber Remedios  is a 85 y.o. male,w hypertension, hyperlipidemia, h/o mobitz type 2, s/p pacer, chronic diastolic CHF,Pafib, who presents with mechanical fall, and c/o right hip pain.  Pt denies syncope, cp, palp, sob, n/v, abd pain, diarrhea, brbpr, dysuria.    In ED,  T 98.3, P 58  R 14  Bp 141/60  Pox 97% Wt 83.9 kg  Xray R hip IMPRESSION: There is an impacted subcapital fracture of the right femoral neck. No other radiographic evidence of fracture of the pelvis or proximal left femur. Osteopenia.  CXR IMPRESSION: Cardiomegaly without acute abnormality of the lungs. Hiatal hernia.  CT brain IMPRESSION: 1. No acute intracranial abnormality identified. 2. Stable chronic microvascular ischemic changes and parenchymal volume loss of the brain.  Wbc 9.8, Hgb 12.4, Plt 162 Na 143, K 4.3, Bun 22, Creatinine 1.46 Glucose 94  ekg nsr at 60, nl axis, nl int,  Poor R progression, + lvh,   Pt given lorazepam and morphine iv in ED  ED consulted orthopedics,   Pt will be admitted for evaluation of right hip fracture.    Assessment & Plan:   Principal Problem:   Fracture of femoral neck, right, closed (HCC) Active Problems:   Essential hypertension, benign   Hyperlipidemia   Atrial fibrillation (HCC)   BPH (benign prostatic hyperplasia)   OSA on CPAP   Chronic diastolic (congestive) heart failure (HCC)   Closed right hip fracture (HCC)   Chronic systolic CHF (congestive heart failure) (HCC)   Hypothyroidism  1 right femoral neck fracture Secondary to mechanical fall.  Patient denies any recent chest pain or shortness of breath.  Patient states able to go up and down a spiral staircase without any complaints of chest pain or shortness of breath.  Patient with complicated cardiac history with moderate aortic stenosis,  chronic systolic heart failure/cardiomyopathy with a EF of 30 to 35% per 2D echo of 09/28/2018, history of paroxysmal atrial fibrillation, history of Mobitz type II heart block status post PPM.  Due to complicated cardiac history will consult with cardiology for preop clearance.  Patient has been seen in consultation by orthopedics who are planning on hip repair on 04/30/2019.  Xarelto on hold.  Orthopedics following and appreciate input and recommendations.  2.  Paroxysmal atrial fibrillation Rate controlled.  Patient with history of Mobitz type II status post PPM.  Resume home regimen of amiodarone and metoprolol for rate control.  Patient was on anticoagulation with Xarelto which is on hold in anticipation of surgery tomorrow 04/30/2019.  3.  Chronic systolic heart failure/moderate aortic stenosis Currently stable.  2D echo from 09/28/2018 with a EF of 30 to 35% with diffuse hypokinesis and moderate aortic stenosis..  Patient euvolemic on examination.  Will resume home regimen of Lasix, Metroprolol, amiodarone, Entresto, statin, fish oil.  Follow.  4.  Obstructive sleep apnea CPAP nightly.  5.  Hyperlipidemia Check a fasting lipid panel.  Resume home regimen of statin, fish oil.  6.  Hypertension Stable.  Resume home regimen of metoprolol, Entresto.  7.  BPH Continue home regimen Flomax.   DVT prophylaxis: SCDs.  Was on Xarelto. Code Status: Full Family Communication: Updated patient.  No family at bedside. Disposition Plan: To be determined postoperatively.  Likely SNF versus home with home health   Consultants:   Orthopedics: Dr. Charlann Boxer 04/29/2019  Procedures:   Plain films of the right hip and pelvis 04/28/2019  Antimicrobials:  None   Subjective: Patient denies any chest pain.  No shortness of breath.  States when he sits still no pain in his right hip however when he moves has significant pain.  States pain medication decreases pain from a 10 to about a 6 or  7.  Objective: Vitals:   04/28/19 2100 04/28/19 2247 04/29/19 0457 04/29/19 0500  BP: (!) 125/96 (!) 154/75 119/65   Pulse: 63 61 60   Resp: 13 18 15    Temp:  98.2 F (36.8 C) 98.2 F (36.8 C)   TempSrc:  Oral Oral   SpO2: 97% 98% 99%   Weight:    84.2 kg  Height:    5\' 8"  (1.727 m)    Intake/Output Summary (Last 24 hours) at 04/29/2019 1126 Last data filed at 04/29/2019 0908 Gross per 24 hour  Intake 596.32 ml  Output 1300 ml  Net -703.68 ml   Filed Weights   04/28/19 1757 04/29/19 0500  Weight: 83.9 kg 84.2 kg    Examination:  General exam: Appears calm and comfortable  Respiratory system: Clear to auscultation. Respiratory effort normal. Cardiovascular system: S1 & S2 heard, RRR. No JVD, murmurs, rubs, gallops or clicks. No pedal edema. Gastrointestinal system: Abdomen is nondistended, soft and nontender. No organomegaly or masses felt. Normal bowel sounds heard. Central nervous system: Alert and oriented. No focal neurological deficits. Extremities: Symmetric 5 x 5 power. Skin: No rashes, lesions or ulcers Psychiatry: Judgement and insight appear normal. Mood & affect appropriate.     Data Reviewed: I have personally reviewed following labs and imaging studies  CBC: Recent Labs  Lab 04/28/19 1952 04/29/19 0525  WBC 9.8 7.3  NEUTROABS 8.7*  --   HGB 12.4* 11.2*  HCT 39.9 36.6*  MCV 101.8* 102.2*  PLT 162 129*   Basic Metabolic Panel: Recent Labs  Lab 04/28/19 1952 04/29/19 0525  NA 143 140  K 4.3 4.1  CL 106 108  CO2 27 24  GLUCOSE 94 96  BUN 22 20  CREATININE 1.46* 1.16  CALCIUM 8.9 8.2*   GFR: Estimated Creatinine Clearance: 46.5 mL/min (by C-G formula based on SCr of 1.16 mg/dL). Liver Function Tests: Recent Labs  Lab 04/29/19 0525  AST 21  ALT 20  ALKPHOS 53  BILITOT 1.1  PROT 5.5*  ALBUMIN 3.2*   No results for input(s): LIPASE, AMYLASE in the last 168 hours. No results for input(s): AMMONIA in the last 168 hours. Coagulation  Profile: Recent Labs  Lab 04/28/19 2240  INR 1.5*   Cardiac Enzymes: No results for input(s): CKTOTAL, CKMB, CKMBINDEX, TROPONINI in the last 168 hours. BNP (last 3 results) No results for input(s): PROBNP in the last 8760 hours. HbA1C: No results for input(s): HGBA1C in the last 72 hours. CBG: No results for input(s): GLUCAP in the last 168 hours. Lipid Profile: No results for input(s): CHOL, HDL, LDLCALC, TRIG, CHOLHDL, LDLDIRECT in the last 72 hours. Thyroid Function Tests: Recent Labs    04/29/19 0525  TSH 3.504   Anemia Panel: No results for input(s): VITAMINB12, FOLATE, FERRITIN, TIBC, IRON, RETICCTPCT in the last 72 hours. Sepsis Labs: No results for input(s): PROCALCITON, LATICACIDVEN in the last 168 hours.  Recent Results (from the past 240 hour(s))  SARS Coronavirus 2 (CEPHEID - Performed in [facility]), Hosp Order     Status: None   Collection Time: 04/28/19  7:52 PM  Specimen: Nasopharyngeal Swab  Result Value Ref Range Status   SARS Coronavirus 2 NEGATIVE NEGATIVE Final    Comment: (NOTE) If result is NEGATIVE SARS-CoV-2 target nucleic acids are NOT DETECTED. The SARS-CoV-2 RNA is generally detectable in upper and lower  respiratory specimens during the acute phase of infection. The lowest  concentration of SARS-CoV-2 viral copies this assay can detect is 250  copies / mL. A negative result does not preclude SARS-CoV-2 infection  and should not be used as the sole basis for treatment or other  patient management decisions.  A negative result may occur with  improper specimen collection / handling, submission of specimen other  than nasopharyngeal swab, presence of viral mutation(s) within the  areas targeted by this assay, and inadequate number of viral copies  (<250 copies / mL). A negative result must be combined with clinical  observations, patient history, and epidemiological information. If result is POSITIVE SARS-CoV-2 target nucleic  acids are DETECTED. The SARS-CoV-2 RNA is generally detectable in upper and lower  respiratory specimens dur ing the acute phase of infection.  Positive  results are indicative of active infection with SARS-CoV-2.  Clinical  correlation with patient history and other diagnostic information is  necessary to determine patient infection status.  Positive results do  not rule out bacterial infection or co-infection with other viruses. If result is PRESUMPTIVE POSTIVE SARS-CoV-2 nucleic acids MAY BE PRESENT.   A presumptive positive result was obtained on the submitted specimen  and confirmed on repeat testing.  While 2019 novel coronavirus  (SARS-CoV-2) nucleic acids may be present in the submitted sample  additional confirmatory testing may be necessary for epidemiological  and / or clinical management purposes  to differentiate between  SARS-CoV-2 and other Sarbecovirus currently known to infect humans.  If clinically indicated additional testing with an alternate test  methodology [phone]([id number]) is advised. The SARS-CoV-2 RNA is generally  detectable in upper and lower respiratory sp ecimens during the acute  phase of infection. The expected result is Negative. Fact Sheet for Patients:  BoilerBrush.com.cyhttps://www.fda.gov/media/136312/download Fact Sheet for Healthcare Providers: https://pope.com/https://www.fda.gov/media/136313/download This test is not yet approved or cleared by the [place] FDA and has been authorized for detection and/or diagnosis of SARS-CoV-2 by FDA under an Emergency Use Authorization (EUA).  This EUA will remain in effect (meaning this test can be used) for the duration of the COVID-19 declaration under Section 564(b)(1) of the Act, 21 U.S.C. section 360bbb-3(b)(1), unless the authorization is terminated or revoked sooner. Performed at [facility], [address] [place], [place] [address]          Radiology Studies: Ct Head Wo Contrast  Result Date:  04/28/2019 CLINICAL DATA:  85 y/o M; fall with head trauma, minor, pt on anticoagulation. EXAM: CT HEAD WITHOUT CONTRAST TECHNIQUE: Contiguous axial images were obtained from the base of the skull through the vertex without intravenous contrast. COMPARISON:  11/27/2012 CT head. FINDINGS: Brain: No evidence of acute infarction, hemorrhage, hydrocephalus, extra-axial collection or mass lesion/mass effect. Stable nonspecific white matter hypodensities compatible with chronic microvascular ischemic changes and stable volume loss of the brain. Vascular: Calcific atherosclerosis of the internal carotid arteries and the vertebral arteries. No hyperdense vessel. Skull: Normal. Negative for fracture or focal lesion. Sinuses/Orbits: Included paranasal sinuses and left mastoid air cells are normally aerated. Right wall down mastoidectomy chronic postsurgical changes with stable fat attenuating soft tissue posterior mastoidectomy bowl. Other: Bilateral intra-ocular lens replacement. IMPRESSION: 1. No acute intracranial abnormality identified. 2.  Stable chronic microvascular ischemic changes and parenchymal volume loss of the brain. Electronically Signed   By: Kristine Garbe M.D.   On: 04/28/2019 20:17   Dg Chest Portable 1 View  Result Date: 04/28/2019 CLINICAL DATA:  Preoperative hip fracture EXAM: PORTABLE CHEST 1 VIEW COMPARISON:  10/06/2010 FINDINGS: Cardiomegaly with left chest multi lead pacer. Hiatal hernia. Both lungs are clear. The visualized skeletal structures are unremarkable. IMPRESSION: Cardiomegaly without acute abnormality of the lungs.  Hiatal hernia. Electronically Signed   By: Eddie Candle M.D.   On: 04/28/2019 19:12   Dg Hip Unilat  With Pelvis 2-3 Views Right  Result Date: 04/28/2019 CLINICAL DATA:  Fall, pain EXAM: DG HIP (WITH OR WITHOUT PELVIS) 2-3V RIGHT COMPARISON:  None. FINDINGS: There is an impacted subcapital fracture of the right femoral neck. No other radiographic evidence of  fracture of the pelvis or proximal left femur. Osteopenia. IMPRESSION: There is an impacted subcapital fracture of the right femoral neck. No other radiographic evidence of fracture of the pelvis or proximal left femur. Osteopenia. Electronically Signed   By: Eddie Candle M.D.   On: 04/28/2019 18:34        Scheduled Meds: . tamsulosin  0.4 mg Oral Daily   Continuous Infusions:   LOS: 1 day    Time spent: 35 minutes    Irine Seal, MD [facility]  If 7PM-7AM, please contact night-coverage [email or web address] 04/29/2019, 11:26 AM

## 2019-04-30 ENCOUNTER — Encounter (HOSPITAL_COMMUNITY): Payer: Self-pay | Admitting: Physician Assistant

## 2019-04-30 ENCOUNTER — Inpatient Hospital Stay (HOSPITAL_COMMUNITY): Payer: Medicare Other | Admitting: Registered Nurse

## 2019-04-30 ENCOUNTER — Inpatient Hospital Stay (HOSPITAL_COMMUNITY): Payer: Medicare Other

## 2019-04-30 ENCOUNTER — Telehealth: Payer: Self-pay | Admitting: Interventional Cardiology

## 2019-04-30 ENCOUNTER — Encounter (HOSPITAL_COMMUNITY)
Admission: EM | Disposition: A | Discharge status: Home / Self Care | Payer: Self-pay | Source: Home / Self Care | Attending: Internal Medicine

## 2019-04-30 DIAGNOSIS — I10 Essential (primary) hypertension: Secondary | ICD-10-CM

## 2019-04-30 DIAGNOSIS — Z96649 Presence of unspecified artificial hip joint: Secondary | ICD-10-CM

## 2019-04-30 DIAGNOSIS — I5032 Chronic diastolic (congestive) heart failure: Secondary | ICD-10-CM

## 2019-04-30 DIAGNOSIS — I480 Paroxysmal atrial fibrillation: Secondary | ICD-10-CM

## 2019-04-30 DIAGNOSIS — S72001P Fracture of unspecified part of neck of right femur, subsequent encounter for closed fracture with malunion: Secondary | ICD-10-CM

## 2019-04-30 DIAGNOSIS — I350 Nonrheumatic aortic (valve) stenosis: Secondary | ICD-10-CM

## 2019-04-30 DIAGNOSIS — Z01810 Encounter for preprocedural cardiovascular examination: Secondary | ICD-10-CM

## 2019-04-30 HISTORY — PX: HIP ARTHROPLASTY: SHX981

## 2019-04-30 LAB — CBC
HCT: 37.8 % — ABNORMAL LOW (ref 39.0–52.0)
Hemoglobin: 11.6 g/dL — ABNORMAL LOW (ref 13.0–17.0)
MCH: 31.4 pg (ref 26.0–34.0)
MCHC: 30.7 g/dL (ref 30.0–36.0)
MCV: 102.4 fL — ABNORMAL HIGH (ref 80.0–100.0)
Platelets: 121 10*3/uL — ABNORMAL LOW (ref 150–400)
RBC: 3.69 MIL/uL — ABNORMAL LOW (ref 4.22–5.81)
RDW: 14.1 % (ref 11.5–15.5)
WBC: 6.8 10*3/uL (ref 4.0–10.5)
nRBC: 0 % (ref 0.0–0.2)

## 2019-04-30 LAB — TYPE AND SCREEN
ABO/RH(D): O POS
Antibody Screen: NEGATIVE

## 2019-04-30 LAB — BASIC METABOLIC PANEL WITH GFR
Anion gap: 9 (ref 5–15)
BUN: 17 mg/dL (ref 8–23)
CO2: 24 mmol/L (ref 22–32)
Calcium: 8.1 mg/dL — ABNORMAL LOW (ref 8.9–10.3)
Chloride: 102 mmol/L (ref 98–111)
Creatinine, Ser: 1.11 mg/dL (ref 0.61–1.24)
GFR calc Af Amer: >60 mL/min
GFR calc non Af Amer: 59 mL/min — ABNORMAL LOW
Glucose, Bld: 102 mg/dL — ABNORMAL HIGH (ref 70–99)
Potassium: 3.7 mmol/L (ref 3.5–5.1)
Sodium: 135 mmol/L (ref 135–145)

## 2019-04-30 LAB — LIPID PANEL
Cholesterol: 108 mg/dL (ref 0–200)
HDL: 52 mg/dL
LDL Cholesterol: 50 mg/dL (ref 0–99)
Total CHOL/HDL Ratio: 2.1 ratio
Triglycerides: 29 mg/dL
VLDL: 6 mg/dL (ref 0–40)

## 2019-04-30 SURGERY — HEMIARTHROPLASTY, HIP, DIRECT ANTERIOR APPROACH, FOR FRACTURE
Anesthesia: General | Site: Hip | Laterality: Right

## 2019-04-30 MED ORDER — LACTATED RINGERS IV SOLN
INTRAVENOUS | Status: DC
  Administered 2019-04-30 (×2): via INTRAVENOUS

## 2019-04-30 MED ORDER — LIDOCAINE 2% (20 MG/ML) 5 ML SYRINGE
INTRAMUSCULAR | Status: AC
  Filled 2019-04-30: qty 5

## 2019-04-30 MED ORDER — POLYETHYLENE GLYCOL 3350 17 G PO PACK
17.0000 g | PACK | Freq: Two times a day (BID) | ORAL | Status: DC
  Administered 2019-05-01 – 2019-05-02 (×3): 17 g via ORAL
  Filled 2019-04-30 (×3): qty 1

## 2019-04-30 MED ORDER — SODIUM CHLORIDE 0.9 % IV SOLN
INTRAVENOUS | Status: DC
  Administered 2019-04-30: 21:00:00 via INTRAVENOUS

## 2019-04-30 MED ORDER — FENTANYL CITRATE (PF) 100 MCG/2ML IJ SOLN
25.0000 ug | INTRAMUSCULAR | Status: DC | PRN
  Administered 2019-04-30: 50 ug via INTRAVENOUS

## 2019-04-30 MED ORDER — ACETAMINOPHEN 500 MG PO TABS
ORAL_TABLET | ORAL | Status: AC
  Filled 2019-04-30: qty 2

## 2019-04-30 MED ORDER — DEXAMETHASONE SODIUM PHOSPHATE 10 MG/ML IJ SOLN
10.0000 mg | Freq: Once | INTRAMUSCULAR | Status: AC
  Administered 2019-05-01: 10 mg via INTRAVENOUS
  Filled 2019-04-30: qty 1

## 2019-04-30 MED ORDER — ETOMIDATE 2 MG/ML IV SOLN
INTRAVENOUS | Status: DC | PRN
  Administered 2019-04-30: 18 mg via INTRAVENOUS

## 2019-04-30 MED ORDER — FENTANYL CITRATE (PF) 250 MCG/5ML IJ SOLN
INTRAMUSCULAR | Status: AC
  Filled 2019-04-30: qty 5

## 2019-04-30 MED ORDER — FENTANYL CITRATE (PF) 250 MCG/5ML IJ SOLN
INTRAMUSCULAR | Status: DC | PRN
  Administered 2019-04-30 (×3): 50 ug via INTRAVENOUS
  Administered 2019-04-30: 100 ug via INTRAVENOUS

## 2019-04-30 MED ORDER — ONDANSETRON HCL 4 MG/2ML IJ SOLN
INTRAMUSCULAR | Status: DC | PRN
  Administered 2019-04-30: 4 mg via INTRAVENOUS

## 2019-04-30 MED ORDER — ONDANSETRON HCL 4 MG/2ML IJ SOLN
INTRAMUSCULAR | Status: AC
  Filled 2019-04-30: qty 2

## 2019-04-30 MED ORDER — CEFAZOLIN SODIUM-DEXTROSE 2-4 GM/100ML-% IV SOLN
2.0000 g | Freq: Four times a day (QID) | INTRAVENOUS | Status: AC
  Administered 2019-04-30 – 2019-05-01 (×2): 2 g via INTRAVENOUS
  Filled 2019-04-30 (×2): qty 100

## 2019-04-30 MED ORDER — CELECOXIB 200 MG PO CAPS
200.0000 mg | ORAL_CAPSULE | Freq: Two times a day (BID) | ORAL | Status: DC
  Administered 2019-04-30 – 2019-05-02 (×4): 200 mg via ORAL
  Filled 2019-04-30 (×4): qty 1

## 2019-04-30 MED ORDER — ACETAMINOPHEN 500 MG PO TABS
1000.0000 mg | ORAL_TABLET | Freq: Once | ORAL | Status: AC
  Administered 2019-04-30: 1000 mg via ORAL

## 2019-04-30 MED ORDER — METHOCARBAMOL 500 MG IVPB - SIMPLE MED
500.0000 mg | Freq: Four times a day (QID) | INTRAVENOUS | Status: DC | PRN
  Administered 2019-04-30: 500 mg via INTRAVENOUS
  Filled 2019-04-30: qty 50

## 2019-04-30 MED ORDER — SODIUM CHLORIDE 0.9 % IR SOLN
Status: DC | PRN
  Administered 2019-04-30: 1000 mL

## 2019-04-30 MED ORDER — BISACODYL 10 MG RE SUPP: 10.0000 mg | Freq: Every day | RECTAL | Status: DC | PRN

## 2019-04-30 MED ORDER — SUGAMMADEX SODIUM 200 MG/2ML IV SOLN
INTRAVENOUS | Status: AC
  Filled 2019-04-30: qty 4

## 2019-04-30 MED ORDER — DEXAMETHASONE SODIUM PHOSPHATE 10 MG/ML IJ SOLN
INTRAMUSCULAR | Status: DC | PRN
  Administered 2019-04-30: 10 mg via INTRAVENOUS

## 2019-04-30 MED ORDER — HYDROCODONE-ACETAMINOPHEN 5-325 MG PO TABS: 1.0000 | ORAL_TABLET | ORAL | Status: DC | PRN

## 2019-04-30 MED ORDER — ONDANSETRON HCL 4 MG/2ML IJ SOLN: 4.0000 mg | Freq: Four times a day (QID) | INTRAMUSCULAR | Status: DC | PRN

## 2019-04-30 MED ORDER — SODIUM CHLORIDE 0.9 % IV SOLN
INTRAVENOUS | Status: DC | PRN
  Administered 2019-04-30: 40 ug/min via INTRAVENOUS

## 2019-04-30 MED ORDER — DIPHENHYDRAMINE HCL 12.5 MG/5ML PO ELIX: 12.5000 mg | ORAL_SOLUTION | ORAL | Status: DC | PRN

## 2019-04-30 MED ORDER — METOPROLOL SUCCINATE ER 25 MG PO TB24
25.0000 mg | ORAL_TABLET | Freq: Every day | ORAL | Status: DC
  Administered 2019-05-01 – 2019-05-02 (×2): 25 mg via ORAL
  Filled 2019-04-30 (×2): qty 1

## 2019-04-30 MED ORDER — PHENYLEPHRINE 40 MCG/ML (10ML) SYRINGE FOR IV PUSH (FOR BLOOD PRESSURE SUPPORT)
PREFILLED_SYRINGE | INTRAVENOUS | Status: DC | PRN
  Administered 2019-04-30: 120 ug via INTRAVENOUS

## 2019-04-30 MED ORDER — MAGNESIUM CITRATE PO SOLN: 1.0000 | Freq: Once | ORAL | Status: DC | PRN

## 2019-04-30 MED ORDER — LIDOCAINE 2% (20 MG/ML) 5 ML SYRINGE
INTRAMUSCULAR | Status: DC | PRN
  Administered 2019-04-30: 80 mg via INTRAVENOUS

## 2019-04-30 MED ORDER — ONDANSETRON HCL 4 MG/2ML IJ SOLN: 4.0000 mg | Freq: Once | INTRAMUSCULAR | Status: DC | PRN

## 2019-04-30 MED ORDER — ROCURONIUM BROMIDE 10 MG/ML (PF) SYRINGE
PREFILLED_SYRINGE | INTRAVENOUS | Status: AC
  Filled 2019-04-30: qty 10

## 2019-04-30 MED ORDER — RIVAROXABAN 10 MG PO TABS
10.0000 mg | ORAL_TABLET | Freq: Every day | ORAL | Status: DC
  Administered 2019-05-01 – 2019-05-02 (×2): 10 mg via ORAL
  Filled 2019-04-30 (×2): qty 1

## 2019-04-30 MED ORDER — MORPHINE SULFATE (PF) 2 MG/ML IV SOLN: 0.5000 mg | INTRAVENOUS | Status: DC | PRN

## 2019-04-30 MED ORDER — PHENOL 1.4 % MT LIQD: 1.0000 | OROMUCOSAL | Status: DC | PRN

## 2019-04-30 MED ORDER — METOCLOPRAMIDE HCL 5 MG PO TABS: 5.0000 mg | ORAL_TABLET | Freq: Three times a day (TID) | ORAL | Status: DC | PRN

## 2019-04-30 MED ORDER — METHOCARBAMOL 500 MG PO TABS: 500.0000 mg | ORAL_TABLET | Freq: Four times a day (QID) | ORAL | Status: DC | PRN

## 2019-04-30 MED ORDER — TRANEXAMIC ACID-NACL 1000-0.7 MG/100ML-% IV SOLN
1000.0000 mg | Freq: Once | INTRAVENOUS | Status: AC
  Administered 2019-04-30: 1000 mg via INTRAVENOUS
  Filled 2019-04-30: qty 100

## 2019-04-30 MED ORDER — SUGAMMADEX SODIUM 200 MG/2ML IV SOLN
INTRAVENOUS | Status: DC | PRN
  Administered 2019-04-30: 200 mg via INTRAVENOUS

## 2019-04-30 MED ORDER — SUCCINYLCHOLINE CHLORIDE 200 MG/10ML IV SOSY
PREFILLED_SYRINGE | INTRAVENOUS | Status: AC
  Filled 2019-04-30: qty 10

## 2019-04-30 MED ORDER — PHENYLEPHRINE 40 MCG/ML (10ML) SYRINGE FOR IV PUSH (FOR BLOOD PRESSURE SUPPORT)
PREFILLED_SYRINGE | INTRAVENOUS | Status: AC
  Filled 2019-04-30: qty 10

## 2019-04-30 MED ORDER — ACETAMINOPHEN 325 MG PO TABS
325.0000 mg | ORAL_TABLET | Freq: Four times a day (QID) | ORAL | Status: DC | PRN
  Filled 2019-04-30: qty 2

## 2019-04-30 MED ORDER — ALUM & MAG HYDROXIDE-SIMETH 200-200-20 MG/5ML PO SUSP: 15.0000 mL | ORAL | Status: DC | PRN

## 2019-04-30 MED ORDER — ETOMIDATE 2 MG/ML IV SOLN
INTRAVENOUS | Status: AC
  Filled 2019-04-30: qty 10

## 2019-04-30 MED ORDER — ONDANSETRON HCL 4 MG PO TABS: 4.0000 mg | ORAL_TABLET | Freq: Four times a day (QID) | ORAL | Status: DC | PRN

## 2019-04-30 MED ORDER — MENTHOL 3 MG MT LOZG: 1.0000 | LOZENGE | OROMUCOSAL | Status: DC | PRN

## 2019-04-30 MED ORDER — FENTANYL CITRATE (PF) 100 MCG/2ML IJ SOLN
INTRAMUSCULAR | Status: AC
  Administered 2019-04-30: 50 ug via INTRAVENOUS
  Filled 2019-04-30: qty 2

## 2019-04-30 MED ORDER — DEXAMETHASONE SODIUM PHOSPHATE 10 MG/ML IJ SOLN
INTRAMUSCULAR | Status: AC
  Filled 2019-04-30: qty 1

## 2019-04-30 MED ORDER — SUCCINYLCHOLINE CHLORIDE 200 MG/10ML IV SOSY
PREFILLED_SYRINGE | INTRAVENOUS | Status: DC | PRN
  Administered 2019-04-30: 100 mg via INTRAVENOUS

## 2019-04-30 MED ORDER — FERROUS SULFATE 325 (65 FE) MG PO TABS
325.0000 mg | ORAL_TABLET | Freq: Three times a day (TID) | ORAL | Status: DC
  Administered 2019-05-01 – 2019-05-02 (×4): 325 mg via ORAL
  Filled 2019-04-30 (×4): qty 1

## 2019-04-30 MED ORDER — HYDROCORTISONE 1 % EX CREA
TOPICAL_CREAM | Freq: Four times a day (QID) | CUTANEOUS | Status: DC
  Administered 2019-04-30 – 2019-05-02 (×6): via TOPICAL
  Filled 2019-04-30: qty 28

## 2019-04-30 MED ORDER — PHENYLEPHRINE HCL-NACL 10-0.9 MG/250ML-% IV SOLN
INTRAVENOUS | Status: AC
  Filled 2019-04-30: qty 250

## 2019-04-30 MED ORDER — METOCLOPRAMIDE HCL 5 MG/ML IJ SOLN: 5.0000 mg | Freq: Three times a day (TID) | INTRAMUSCULAR | Status: DC | PRN

## 2019-04-30 MED ORDER — ROCURONIUM BROMIDE 10 MG/ML (PF) SYRINGE
PREFILLED_SYRINGE | INTRAVENOUS | Status: DC | PRN
  Administered 2019-04-30: 50 mg via INTRAVENOUS

## 2019-04-30 MED ORDER — PROPOFOL 10 MG/ML IV BOLUS
INTRAVENOUS | Status: AC
  Filled 2019-04-30: qty 20

## 2019-04-30 MED ORDER — FUROSEMIDE 20 MG PO TABS
20.0000 mg | ORAL_TABLET | Freq: Every day | ORAL | Status: DC
  Administered 2019-05-01 – 2019-05-02 (×2): 20 mg via ORAL
  Filled 2019-04-30 (×2): qty 1

## 2019-04-30 MED ORDER — HYDROCODONE-ACETAMINOPHEN 7.5-325 MG PO TABS: 1.0000 | ORAL_TABLET | ORAL | Status: DC | PRN

## 2019-04-30 MED ORDER — METHOCARBAMOL 500 MG IVPB - SIMPLE MED
INTRAVENOUS | Status: AC
  Administered 2019-04-30: 500 mg via INTRAVENOUS
  Filled 2019-04-30: qty 50

## 2019-04-30 MED ORDER — DOCUSATE SODIUM 100 MG PO CAPS
100.0000 mg | ORAL_CAPSULE | Freq: Two times a day (BID) | ORAL | Status: DC
  Administered 2019-04-30 – 2019-05-02 (×4): 100 mg via ORAL
  Filled 2019-04-30 (×4): qty 1

## 2019-04-30 SURGICAL SUPPLY — 46 items
ADH SKN CLS APL DERMABOND .7 (GAUZE/BANDAGES/DRESSINGS) ×1
BAG SPEC THK2 15X12 ZIP CLS (MISCELLANEOUS)
BAG ZIPLOCK 12X15 (MISCELLANEOUS) ×1 IMPLANT
BLADE SAW SGTL 11.0X1.19X90.0M (BLADE) IMPLANT
BLADE SAW SGTL 18X1.27X75 (BLADE) ×2 IMPLANT
BLADE SAW SGTL 18X1.27X75MM (BLADE) ×1
COVER SURGICAL LIGHT HANDLE (MISCELLANEOUS) ×3 IMPLANT
COVER WAND RF STERILE (DRAPES) IMPLANT
DERMABOND ADVANCED (GAUZE/BANDAGES/DRESSINGS) ×2
DERMABOND ADVANCED .7 DNX12 (GAUZE/BANDAGES/DRESSINGS) ×1 IMPLANT
DRAPE ORTHO SPLIT 77X108 STRL (DRAPES) ×6
DRAPE POUCH INSTRU U-SHP 10X18 (DRAPES) ×3 IMPLANT
DRAPE SURG 17X11 SM STRL (DRAPES) ×3 IMPLANT
DRAPE SURG ORHT 6 SPLT 77X108 (DRAPES) ×2 IMPLANT
DRAPE U-SHAPE 47X51 STRL (DRAPES) ×3 IMPLANT
DRSG AQUACEL AG ADV 3.5X10 (GAUZE/BANDAGES/DRESSINGS) ×3 IMPLANT
DRSG TEGADERM 4X4.75 (GAUZE/BANDAGES/DRESSINGS) ×1 IMPLANT
DURAPREP 26ML APPLICATOR (WOUND CARE) ×3 IMPLANT
ELECT BLADE TIP CTD 4 INCH (ELECTRODE) ×3 IMPLANT
ELECT REM PT RETURN 15FT ADLT (MISCELLANEOUS) ×3 IMPLANT
GLOVE BIO SURGEON STRL SZ 6 (GLOVE) ×2 IMPLANT
GLOVE BIOGEL PI IND STRL 6.5 (GLOVE) IMPLANT
GLOVE BIOGEL PI IND STRL 7.5 (GLOVE) ×1 IMPLANT
GLOVE BIOGEL PI INDICATOR 6.5 (GLOVE) ×2
GLOVE BIOGEL PI INDICATOR 7.5 (GLOVE) ×2
GLOVE ORTHO TXT STRL SZ7.5 (GLOVE) ×4 IMPLANT
GOWN STRL REUS W/TWL LRG LVL3 (GOWN DISPOSABLE) ×3 IMPLANT
HEAD FEM UNIPOLAR 56 OD STRL (Hips) ×2 IMPLANT
KIT BASIN OR (CUSTOM PROCEDURE TRAY) ×3 IMPLANT
KIT TURNOVER KIT A (KITS) IMPLANT
MANIFOLD NEPTUNE II (INSTRUMENTS) ×3 IMPLANT
PACK TOTAL JOINT (CUSTOM PROCEDURE TRAY) ×3 IMPLANT
PROTECTOR NERVE ULNAR (MISCELLANEOUS) ×3 IMPLANT
SPACER FEM TAPERED +5 12/14 (Hips) ×2 IMPLANT
STEM TRI LOC GRIPTION SZ 7 STD IMPLANT
SUT ETHIBOND 2 (SUTURE) IMPLANT
SUT MNCRL AB 4-0 PS2 18 (SUTURE) ×3 IMPLANT
SUT STRATAFIX 0 PDS 27 VIOLET (SUTURE) ×3
SUT VIC AB 1 CT1 36 (SUTURE) ×3 IMPLANT
SUT VIC AB 2-0 CT1 27 (SUTURE) ×6
SUT VIC AB 2-0 CT1 TAPERPNT 27 (SUTURE) ×2 IMPLANT
SUTURE STRATFX 0 PDS 27 VIOLET (SUTURE) ×1 IMPLANT
TOWEL OR 17X26 10 PK STRL BLUE (TOWEL DISPOSABLE) ×6 IMPLANT
TOWEL OR NON WOVEN STRL DISP B (DISPOSABLE) ×3 IMPLANT
TRAY FOLEY MTR SLVR 16FR STAT (SET/KITS/TRAYS/PACK) ×1 IMPLANT
TRI LOC GRIPTION SZ 7 STD ×3 IMPLANT

## 2019-04-30 NOTE — Progress Notes (Signed)
Patient ID: Gregory Mccormick, male   DOB: 04/27/1929, 85 y.o.   MRN: [id number] Subjective: Day of Surgery Procedure(s) (LRB):  Doing fairly well. No events. Ready for procedure    Objective:   VITALS:   Vitals:   04/29/19 2247 04/30/19 0607  BP: 118/62 (!) 123/91  Pulse: 61 63  Resp: 16 18  Temp: 99.1 F (37.3 C) 99.2 F (37.3 C)  SpO2: 96% 97%    Awake alert Right leg positioned over a pillow for comfort NVI RLE  LABS Recent Labs    04/28/19 1952 04/29/19 0525 04/30/19 0518  HGB 12.4* 11.2* 11.6*  HCT 39.9 36.6* 37.8*  WBC 9.8 7.3 6.8  PLT 162 129* 121*    Recent Labs    04/28/19 1952 04/29/19 0525 04/30/19 0518  NA 143 140 135  K 4.3 4.1 3.7  BUN 22 20 17  CREATININE 1.46* 1.16 1.11  GLUCOSE 94 96 102*    Recent Labs    04/28/19 2240  INR 1.5*     Assessment/Plan: Day of Surgery Procedure(s) (LRB): Right femoral neck fracture   Plan: To OR today for right hip hemiarthroplasty NPO preo-op meds and consent ordered Reviewed surgical plan and post op course and expectations 

## 2019-04-30 NOTE — Consult Note (Addendum)
Cardiology Consultation:   Patient ID: ICHAEL PULLARA; [id number]; Aug 15, 1929   Admit date: 04/28/2019 Date of Consult: 04/30/2019  Primary Care Provider: Antony Contras, MD Primary Cardiologist: Larae Grooms, MD Primary Electrophysiologist:  Thompson Grayer, MD  Chief Complaint: fall  Patient Profile:   Gregory Mccormick is a 85 y.o. 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 Grandville Silos.  History of Present Illness:   To recap, Mr. Kops had remote PCI in 2014 and previously had normal LV function. He was hospitalized in 08/2018 with atypical chest pain and found to have decreased EF to 30-35%, diffuse HK, moderate AS, mild MR, mod LAE, severely dilated RA, mildly dilated RV, mild TR, PASP 46, elevated CVP. Troponins were flat and CTA was negative for PE. Dr. Irish Lack recommended to add Imdur, and consider cath as OP if he continued to have symptoms. He was maintaining sinus rhythm with PVCs. He was seen back in the office on 10/08/2018 and found to be in atrial fibrillation with RVR. He was started on amiodarone and underwent cardioversion on 10/09/2018 to sinus rhythm in the 70s. He was seen back in clinic 10/18/18 by Dr. Irish Lack and feeling fatigued, prompting reduction in amidoarone. He saw Pecolia Ades in follow-up 11/07/18 and he continued to report DOE, activity intolerance, fatigue and lower extremity edema. EKG was AV paced with atrial activity. He was started on Entresto and encouraged to resume CPAP. I saw him back 11/20/18 and hew as feeling remarkably better with Nina's changes. He saw Dr. Irish Lack back 01/2019 via telehealth  and was feeling well. Telephone notes from 03/2019 indicate some confusion about what meds he was actually supposed to be on (taking both metoprolol tartrate and succinate, med list having Imdur but not taking it, med list having amiodarone daily but patient taking 2x daily). His last device check 02/26/19 showed 0.6% PAF burden.  He presented with mechanical fall followed by right hip pain, found to have a right hip fracture. He tripped on the lip going into his great room. Denies any recent CP, SOB, syncope, palpitations, orthopnea, edema. He walks his dog daily without any anginal symptoms and also navigates the steps around his deck without cardiac limitation. Labs notable for mild anemia in the 11 range with mild thrombocytopenia, mild renal insufficiency with Cr 1.46 (improved today), normal TSH, Covid negative. CT head without acute issue. VSS. Xarelto on hold for surgical repair. Cardiology asked to consult for preop clearance.  Past Medical History:  Diagnosis Date  . Anemia, unspecified   . Aortic stenosis   . Arthritis    "right knee" (03/28/2013  . CAD in native artery    takes Xarelto daily  . Cardiomyopathy ([place])    EF 40%:  ECHO 11/25/2015 EF 55%-60%  . Carpal tunnel syndrome, bilateral   . Chronic systolic CHF (congestive heart failure) ([place])   . CKD (chronic kidney disease), stage II   . Epistaxis   . Essential hypertension, benign    takes Metoprolol daily  . H/O hiatal hernia   . History of kidney stones   . Hyperlipidemia    takes Lipitor daily  . Nonspecific abnormal unspecified cardiovascular  function study   . OSA on CPAP    wears CPAP 02/03/16  . Pacemaker    MDT  . PAF (paroxysmal atrial fibrillation) ([facility])   . Paroxysmal ventricular tachycardia ([facility])   . PONV (postoperative nausea and vomiting)   . PVC's (premature ventricular contractions)   . Vitamin D deficiency     Past Surgical History:  Procedure Laterality Date  . CARDIAC CATHETERIZATION  03/21/2013  .  CARDIOVERSION N/A 10/09/2018   Procedure: CARDIOVERSION;  Surgeon: Wendall StadeNishan, Peter C, MD;  [facility];  Service: Cardiovascular;  Laterality: N/A;  . CARPAL TUNNEL RELEASE Right 02/05/2016   Procedure: RIGHT LIMITED OPEN CARPAL TUNNEL RELEASE;  Surgeon: Dominica SeverinWilliam Gramig, MD;  Location: [facility] OR;  Service: Orthopedics;  Laterality: Right;  . CARPAL TUNNEL RELEASE Left 04/21/2016   Procedure: CARPAL TUNNEL RELEASE;  Surgeon: Dominica SeverinWilliam Gramig, MD;  Location: [facility] OR;  Service: Orthopedics;  Laterality: Left;  . CATARACT EXTRACTION W/ INTRAOCULAR LENS  IMPLANT, BILATERAL Bilateral 2000's  . COLONOSCOPY    . CORONARY ANGIOPLASTY WITH STENT PLACEMENT  03/28/2013   "3 stents" (03/28/2013)  . INSERT / REPLACE / REMOVE PACEMAKER    . KNEE CARTILAGE SURGERY Left 1972   "cut it open" (03/28/2013)  . KNEE CARTILAGE SURGERY Right ~ 1977   "cut it open" (03/28/2013)  . PACEMAKER INSERTION  10/05/10   MDT Adapta L implanted by Dr Johney FrameAllred for mobitz II second degree AV block  . PERCUTANEOUS CORONARY ROTOBLATOR INTERVENTION (PCI-R) N/A 03/28/2013   Procedure: PERCUTANEOUS CORONARY ROTOBLATOR INTERVENTION (PCI-R);  Surgeon: Corky CraftsJayadeep S Varanasi, MD;  [facility];  Service: Cardiovascular;  Laterality: N/A;  . SEPTOPLASTY N/A 02/05/2016   Procedure: SEPTOPLASTY WITH INTRANASAL SKIN GRAFT;  Surgeon: Drema Halonhristopher E Newman, MD;  [facility];  Service: ENT;  Laterality: N/A;  . SHOULDER HEMI-ARTHROPLASTY Right 1987   "got hit by sled & dragged down sheet > 200 feet; tore shoulder up" (03/28/2013)  . SHOULDER SURGERY Right 1970's   "pulled muscle apart; sewed it back together" (03/28/2013  . TOTAL KNEE ARTHROPLASTY Right 2007  . TOTAL KNEE ARTHROPLASTY  11/26/2012   Procedure: TOTAL KNEE ARTHROPLASTY;  Surgeon: Loanne DrillingFrank V Aluisio, MD;  Location: [facility] ORS;  Service: Orthopedics;  Laterality: Left;  . TRANSURETHRAL RESECTION OF PROSTATE N/A 12/27/2017   Procedure: TRANSURETHRAL RESECTION OF THE PROSTATE (TURP);   Surgeon: Crista ElliotBell, Eugene D III, MD;  [facility] [place];  Service: Urology;  Laterality: N/A;     Inpatient Medications: Scheduled Meds: . amiodarone  200 mg Oral Daily  . atorvastatin  10 mg Oral q1800  . chlorhexidine  60 mL Topical Once  . furosemide  20 mg Oral Daily  . hydrocortisone cream   Topical QID  . metoprolol succinate  25 mg Oral Daily  . omega-3 acid ethyl esters  1 g Oral Daily  . povidone-iodine  2 application Topical Once  . sacubitril-valsartan  1 tablet Oral BID  . tamsulosin  0.4 mg Oral Daily   Continuous Infusions: . sodium chloride 50 mL/hr at 04/29/19 2004  .  ceFAZolin (ANCEF) IV    . tranexamic acid     PRN Meds: acetaminophen **OR** acetaminophen, morphine injection, nitroGLYCERIN, ondansetron (ZOFRAN) IV  Home Meds: Prior to Admission medications   Medication Sig Start Date End Date Taking? Authorizing Provider  acetaminophen (TYLENOL) 500 MG tablet Take 1,000 mg by mouth daily as needed for mild pain or headache.    Yes [provider]  amiodarone (PACERONE)  200 MG tablet Take 1 tablet (200 mg total) by mouth daily. 11/08/18  Yes Berton Bon, NP  atorvastatin (LIPITOR) 10 MG tablet TAKE 1 TABLET BY MOUTH DAILY AT 6 PM. 01/01/19  Yes Corky Crafts, MD  furosemide (LASIX) 20 MG tablet Take 1 tablet (20 mg total) by mouth daily. 11/07/18  Yes Berton Bon, NP  Metoprolol Succinate 25 MG CS24 Take 25 mg by mouth daily. 03/22/19  Yes Corky Crafts, MD  Multiple Vitamins-Minerals (PRESERVISION AREDS 2 PO) Take 1 capsule by mouth 2 (two) times daily.    Yes [provider]  nitroGLYCERIN (NITROSTAT) 0.4 MG SL tablet Place 1 tablet (0.4 mg total) under the tongue every 5 (five) minutes as needed for chest pain. 11/17/17  Yes Corky Crafts, MD  Omega-3 Fatty Acids (FISH OIL) 1000 MG CAPS Take 1,000 mg by mouth daily.   Yes [provider]  sacubitril-valsartan (ENTRESTO) 24-26 MG Take 1 tablet by  mouth 2 (two) times daily. 11/07/18  Yes Berton Bon, NP  XARELTO 20 MG TABS tablet TAKE 1 TABLET (20 MG TOTAL) BY MOUTH DAILY WITH SUPPER. 04/29/19   Hillis Range, MD    Allergies:    Allergies  Allergen Reactions  . Penicillins Hives    Has tolerated Keflex      Has patient had a PCN reaction causing immediate rash, facial/tongue/throat swelling, SOB or lightheadedness with hypotension: YES Has patient had a PCN reaction causing severe rash involving mucus membranes or skin necrosis: No Has patient had a PCN reaction that required hospitalization No Has patient had a PCN reaction occurring within the last 10 years: No If all of the above answers are "NO", then may proceed with Cephalosporin use.   Marland Kitchen Hydromorphone Hcl Nausea And Vomiting    Social History:   Social History   Socioeconomic History  . Marital status: Married    Spouse name: Not on file  . Number of children: Not on file  . Years of education: Not on file  . Highest education level: Not on file  Occupational History  . Occupation: retired    Associate Professor: [employer]  Social Needs  . Financial resource strain: Not on file  . Food insecurity    Worry: Not on file    Inability: Not on file  . Transportation needs    Medical: Not on file    Non-medical: Not on file  Tobacco Use  . Smoking status: Former Smoker    Packs/day: 1.00    Years: 26.00    Pack years: 26.00    Types: Cigarettes    Quit date: 10/31/1973    Years since quitting: 45.5  . Smokeless tobacco: Never Used  Substance and Sexual Activity  . Alcohol use: No  . Drug use: No  . Sexual activity: Never  Lifestyle  . Physical activity    Days per week: Not on file    Minutes per session: Not on file  . Stress: Not on file  Relationships  . Social Musician on phone: Not on file    Gets together: Not on file    Attends religious service: Not on file    Active member of club or organization: Not on file    Attends meetings of  clubs or organizations: Not on file    Relationship status: Not on file  . Intimate partner violence    Fear of current or ex partner: Not on file    Emotionally abused:  Not on file    Physically abused: Not on file    Forced sexual activity: Not on file  Other Topics Concern  . Not on file  Social History Narrative  . Not on file    Family History:   The patient's family history includes CAD (age of onset: 34) in his brother; Cancer in an other family member; Heart disease in an other family member; Stroke in his brother. There is no history of Heart attack or Hypertension.  ROS:  Please see the history of present illness.  All other ROS reviewed and negative.     Physical Exam/Data:   Vitals:   04/29/19 1407 04/29/19 2247 04/30/19 0607 04/30/19 0611  BP: (!) 127/58 118/62 (!) 123/91   Pulse: 62 61 63   Resp: Temp: 98.2 F (36.8 C) 99.1 F (37.3 C) 99.2 F (37.3 C)   TempSrc: Oral Oral Oral   SpO2: 96% 96% 97%   Weight:    85.2 kg  Height:        Intake/Output Summary (Last 24 hours) at 04/30/2019 0844 Last data filed at 04/30/2019 0800 Gross per 24 hour  Intake 1374.54 ml  Output 1650 ml  Net -275.46 ml   Last 3 Weights 04/30/2019 04/29/2019 04/28/2019  Weight (lbs) 187 lb 13.3 oz 185 lb 10 oz 185 lb  Weight (kg) 85.2 kg 84.2 kg 83.915 kg    Body mass index is 28.56 kg/m.  General: Well developed, well nourished WM, in no acute distress. Head: Normocephalic, atraumatic, sclera non-icteric, no xanthomas, nares are without discharge.  Neck: Negative for carotid bruits. JVD not elevated. Lungs: Clear bilaterally to auscultation without wheezes, rales, or rhonchi. Breathing is unlabored. Heart: RRR with S1 S2. Soft SEM at RUSB with preserved S2. No rubs or gallops appreciated. Abdomen: Soft, non-tender, non-distended with normoactive bowel sounds. No hepatomegaly. No rebound/guarding. No obvious abdominal masses. Msk:  Strength and tone appear normal for  age. Extremities: No clubbing or cyanosis. No edema.  Distal pedal pulses are 2+ and equal bilaterally. Neuro: Alert and oriented X 3. No facial asymmetry. No focal deficit. Moves all extremities spontaneously. Hard of hearing (does not have hearing aids in) Psych:  Responds to questions appropriately with a normal affect.  EKG:  The EKG was personally reviewed and demonstrates: Quality is diminished due to baseline artifact, obscuring ability to see P waves, but does appears to show regular rhythm with nonspecific STT changes. I do not see any apparent pacer spikes. Telemetry shows both NSR as well as intermittently atrial paced rhythm.  Relevant CV Studies: Most recent pertinent cardiac studies are outlined above.  Laboratory Data:  Chemistry Recent Labs  Lab 04/28/19 1952 04/29/19 0525 04/30/19 0518  NA 143 140 135  K 4.3 4.1 3.7  CL 106 108 102  CO2 GLUCOSE 94 96 102*  BUN CREATININE 1.46* 1.16 1.11  CALCIUM 8.9 8.2* 8.1*  GFRNONAA 42* 56* 59*  GFRAA 49* >60 >60  ANIONGAP Recent Labs  Lab 04/29/19 0525  PROT 5.5*  ALBUMIN 3.2*  AST 21  ALT 20  ALKPHOS 53  BILITOT 1.1   Hematology Recent Labs  Lab 04/28/19 1952 04/29/19 0525 04/30/19 0518  WBC 9.8 7.3 6.8  RBC 3.92* 3.58* 3.69*  HGB 12.4* 11.2* 11.6*  HCT 39.9 36.6* 37.8*  MCV 101.8* 102.2* 102.4*  MCH 31.6 31.3 31.4  MCHC 31.1 30.6 30.7  RDW 14.2 14.1 14.1  PLT 162 129* 121*   Cardiac EnzymesNo results for input(s): TROPONINI in the last 168 hours. No results for input(s): TROPIPOC in the last 168 hours.  BNPNo results for input(s): BNP, PROBNP in the last 168 hours.  DDimer No results for input(s): DDIMER in the last 168 hours.  Radiology/Studies:  Ct Head Wo Contrast  Result Date: 04/28/2019 CLINICAL DATA:  85 y/o M; fall with head trauma, minor, pt on anticoagulation. EXAM: CT HEAD WITHOUT CONTRAST TECHNIQUE: Contiguous axial images were obtained from the base of the  skull through the vertex without intravenous contrast. COMPARISON:  11/27/2012 CT head. FINDINGS: Brain: No evidence of acute infarction, hemorrhage, hydrocephalus, extra-axial collection or mass lesion/mass effect. Stable nonspecific white matter hypodensities compatible with chronic microvascular ischemic changes and stable volume loss of the brain. Vascular: Calcific atherosclerosis of the internal carotid arteries and the vertebral arteries. No hyperdense vessel. Skull: Normal. Negative for fracture or focal lesion. Sinuses/Orbits: Included paranasal sinuses and left mastoid air cells are normally aerated. Right wall down mastoidectomy chronic postsurgical changes with stable fat attenuating soft tissue posterior mastoidectomy bowl. Other: Bilateral intra-ocular lens replacement. IMPRESSION: 1. No acute intracranial abnormality identified. 2. Stable chronic microvascular ischemic changes and parenchymal volume loss of the brain. Electronically Signed   By: Mitzi HansenLance  Furusawa-Stratton M.D.   On: 04/28/2019 20:17   Dg Chest Portable 1 View  Result Date: 04/28/2019 CLINICAL DATA:  Preoperative hip fracture EXAM: PORTABLE CHEST 1 VIEW COMPARISON:  10/06/2010 FINDINGS: Cardiomegaly with left chest multi lead pacer. Hiatal hernia. Both lungs are clear. The visualized skeletal structures are unremarkable. IMPRESSION: Cardiomegaly without acute abnormality of the lungs.  Hiatal hernia. Electronically Signed   By: Lauralyn PrimesAlex  Bibbey M.D.   On: 04/28/2019 19:12   Dg Hip Unilat  With Pelvis 2-3 Views Right  Result Date: 04/28/2019 CLINICAL DATA:  Fall, pain EXAM: DG HIP (WITH OR WITHOUT PELVIS) 2-3V RIGHT COMPARISON:  None. FINDINGS: There is an impacted subcapital fracture of the right femoral neck. No other radiographic evidence of fracture of the pelvis or proximal left femur. Osteopenia. IMPRESSION: There is an impacted subcapital fracture of the right femoral neck. No other radiographic evidence of fracture of the  pelvis or proximal left femur. Osteopenia. Electronically Signed   By: Lauralyn PrimesAlex  Bibbey M.D.   On: 04/28/2019 18:34    Assessment and Plan:   1. Pre-op cardiovascular examination - at baseline the patient is at least moderate risk for surgery given his LV dysfunction and advanced age. It has not been determined whether or not his cardiomyopathy that developed in 2019 was ischemic in nature as he has been managed medically. Revised cardiac risk index is 6.6% indicating moderate risk as well. However, the patient is able to exceed 4 METS without angina therefore suspect we can clear without further testing at this time - will review definitvely with MD.  2. Coronary artery disease s/p remote PCI - asymptomatic. Not on ASA due to concomitant Xarelto.  3. Chronic systolic CHF/cardiomyopathy - appears euvolemic. Continue medical therapy perioperatively. Judicious use of IVF postoperatively. Add strict I/O's, follow daily weights.  4. Moderate aortic stenosis - there is a potential this could represent low gradient severe AS in setting of low EF. However, given lack of cardiac symptoms this has been managed conservatively. He has a preserved S2 on examination which argues more towards a moderate range.  5. Paroxysmal atrial fibrillation - Xarelto on hold for surgery. Just needs attention to resume prior  home dose post-operatively (instead of lower prophylactic dose). CrCl 50ml/min so 20mg  dose remains appropriate.  6. Anemia/thrombocytopenia - prior Hgb in the 12-13 range, now in 11 range with mild thrombocytopenia. Needs to be followed closely postop.  For questions or updates, please contact [facility] Please consult [email or web address] for contact info under Cardiology/STEMI.   Signed, Laurann Montana, PA-C  04/30/2019 8:44 AM   The patient was seen, examined and discussed with Ronie Spies, PA-C and I agree with the above.   85 y.o. 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   History of PCI in 2014, normal LVEF, hospitalization in 2019, new LVEF 30-35%, moderate AS, mild MR, plan for a conservative antianginal therapy and cath only if recurrent symptoms.  He develop atrial fibrillation the same year, started on amiodarone and underwent cardioversion on 10/09/2018.  Also started on Entresto.  He was last seen by Dr. Eldridge Dace back 01/2019 via telehealth and was feeling well. His last device check 02/26/19 showed 0.6% PAF burden.  He was admitted after a mechanical fall followed by right hip pain, found to have a right hip fracture.  He walks his dog daily and denies any exertional chest pain shortness of breath, no recent syncope palpitations no lower extremity edema or orthopnea.   EKG shows sinus rhythm with nonspecific ST-T wave abnormalities and LVH.  Telemetry shows intermittent atrial pacing, no atrial fibrillation.  Electrolytes, creatinine and LFTs are normal.  He has mild anemia with hemoglobin 11.2.  His physical exam reveals no JVDs, clear lungs, S1-S2, 4 out of 6 holosystolic murmur with clear S2, clear lungs, no lower extremity edema.  Denies any recent CP, SOB, syncope, palpitations, orthopnea, edema. He walks his dog daily without any anginal symptoms.  He has been compliant with all of his medications.  Assessment and plan:  Hip fracture Chronic systolic CHF Moderate aortic stenosis CAD  The patient has no signs of CHF or unstable angina, in fact he has been quite active lately and asymptomatic.  He is echocardiogram in December showed moderate aortic stenosis, physical exam shows clear S2 that goes against severe aortic  stenosis. No further work-up is necessary prior to hip surgery.  Patient seems well compensated, continue beta-blockers in the perioperative period.  Tobias Alexander, MD 04/30/2019

## 2019-04-30 NOTE — Transfer of Care (Signed)
Immediate Anesthesia Transfer of Care Note  Patient: Gregory Mccormick  Procedure(s) Performed: ARTHROPLASTY BIPOLAR HIP (HEMIARTHROPLASTY) (Right Hip)  Patient Location: PACU  Anesthesia Type:General  Level of Consciousness: sedated  Airway & Oxygen Therapy: Patient Spontanous Breathing and Patient connected to face mask oxygen  Post-op Assessment: Report given to RN and Post -op Vital signs reviewed and stable  Post vital signs: Reviewed and stable  Last Vitals:  Vitals Value Taken Time  BP    Temp    Pulse 60 04/30/19 1640  Resp 21 04/30/19 1640  SpO2 91 % 04/30/19 1640  Vitals shown include unvalidated device data.  Last Pain:  Vitals:   04/30/19 1312  TempSrc: Oral  PainSc:       Patients Stated Pain Goal: 4 (45/80/99 8338)  Complications: No apparent anesthesia complications

## 2019-04-30 NOTE — Anesthesia Procedure Notes (Signed)
Procedure Name: Intubation Date/Time: 04/30/2019 3:00 PM Performed by: Talbot Grumbling, CRNA Pre-anesthesia Checklist: Patient identified, Emergency Drugs available, Suction available and Patient being monitored Patient Re-evaluated:Patient Re-evaluated prior to induction Oxygen Delivery Method: Circle system utilized Preoxygenation: Pre-oxygenation with 100% oxygen Induction Type: IV induction Ventilation: Mask ventilation without difficulty Laryngoscope Size: Mac and 3 Grade View: Grade I Tube type: Oral Tube size: 7.5 mm Number of attempts: 1 Airway Equipment and Method: Stylet Placement Confirmation: ETT inserted through vocal cords under direct vision,  positive ETCO2 and breath sounds checked- equal and bilateral Secured at: 23 cm Tube secured with: Tape Dental Injury: Teeth and Oropharynx as per pre-operative assessment

## 2019-04-30 NOTE — Op Note (Signed)
NAME:  Gregory Mccormick                  MEDICAL RECORD NO.: [id number]   LOCATION:  2536                         FACILITY:  [facility]   DATE OF BIRTH:  [birth year]  PHYSICIAN:  Pietro Cassis. Alvan Dame, M.D.     DATE OF PROCEDURE:  04/30/2019                               OPERATIVE REPORT     PREOPERATIVE DIAGNOSIS:  Right displaced femoral neck fracture.   POSTOPERATIVE DIAGNOSIS:  Right displaced femoral neck fracture.   PROCEDURE:  Right hip hemiarthroplasty utilizing DePuy component, size 7 standard Tri-Lock stem with a 56 unipolar ball with a +5 adapter.   SURGEON:  Pietro Cassis. Alvan Dame, MD   ASSISTANT:  Griffith Citron, PA-C.   ANESTHESIA:  General.   SPECIMENS:  None.   DRAINS:  None.   BLOOD LOSS:  About 200 cc.   COMPLICATIONS:  None.   INDICATION OF PROCEDURE:  Gregory Mccormick is a pleasant 85 year old male who lives with his wife independently.  He unfortunately had a fall in his house just after taking dog out for walk.  He was admitted to the hospital after radiographs revealed a femoral neck fracture.  He was seen and evaluated and was scheduled for surgery for fixation.  The necessity of surgical repair was discussed with hime.  Consent was obtained after reviewing risks of infection, DVT, component failure, and need for revision surgery.   PROCEDURE IN DETAIL:  The patient was brought to the operative theater. Once adequate anesthesia, preoperative antibiotics, 2 g of Ancef administered, the patient was positioned into the left lateral decubitus position with the right side up.  The right lower extremity was then prepped and draped in sterile fashion.  A time-out was performed identifying the patient, planned procedure, and extremity.   A lateral incision was made off the proximal trochanter. Sharp dissection was carried down to the iliotibial band and gluteal fascia. The gluteal fascia was then incised for posterior approach.  The short external rotators were taken down separate  from the posterior capsule. An L capsulotomy was made preserving the posterior leaflet for later anatomic repair. Fracture site was identified and after removing comminuted segments of the posterior femoral neck, the femoral head was removed without difficulty and measured on the back table  using the sizing rings and determined to be 56 mm in diameter.   The proximal femur was then exposed.  Retractors placed.  I then drilled, opened the proximal femur.  Then I hand reamed once and  Irrigated the canal to try to prevent fat emboli.  I began broaching the femur with a starter broach up to a size 7 broach with good medial and lateral metaphyseal fit without evidence of any torsion or movement.  A trial reduction was carried out with a standard offset neck and a +0 then +5 adapter with a 54mm ball.  The hip reduced nicely.  The leg lengths appeared to be equal compared to the down leg.   The hip went through a range of motion without evidence of any subluxation or impingement.   Given these findings, the trial components removed.  The final 7 Standard  Tri-Lock stem was opened.  After irrigating the canal,  the final stem was impacted and sat at the level where the broach was. Based on this and the trial reduction, a +5 adapter was opened and impacted in the 34mm unipolar ball onto a clean and dry trunnion.  The hip had been irrigated throughout the case and again at this point.  I re- Approximated the posterior capsule to the superior leaflet using a  #1 Vicryl,  and placed a medium Hemovac drain deep.  The remainder of the wound was closed with #1 Vicryl in the iliotibial band and gluteal fascia, a  2-0 Vicryl in the sub-Q tissue and a running 4-0 Monocryl in the skin.  The hip was cleaned, dried, and dressed sterilely using Dermabond and Aquacel dressing.  He was then brought to recovery room, extubated in stable condition, tolerating the procedure well.  Dennie Bible, PA-C was  present and utilized as Geophysicist/field seismologist for the entire case from  Preoperative positioning to management of the contralateral extremity and retractors to  General facilitation of the procedure.  He was also involved with primary wound closure.         Madlyn Frankel Charlann Boxer, M.D.

## 2019-04-30 NOTE — Interval H&P Note (Signed)
History and Physical Interval Note:  04/30/2019 1:45 PM  Gregory Mccormick  has presented today for surgery, with the diagnosis of RIGHT FEMORAL NECK FRACTURE.  The various methods of treatment have been discussed with the patient and family. After consideration of risks, benefits and other options for treatment, the patient has consented to  Procedure(s): ARTHROPLASTY BIPOLAR HIP (HEMIARTHROPLASTY) (Right) as a surgical intervention.  The patient's history has been reviewed, patient examined, no change in status, stable for surgery.  I have reviewed the patient's chart and labs.  Questions were answered to the patient's satisfaction.     Mauri Pole

## 2019-04-30 NOTE — H&P (View-Only) (Signed)
Patient ID: AMAN BONET, male   DOB: Apr 02, 1929, 85 y.o.   MRN: [id number] Subjective: Day of Surgery Procedure(s) (LRB):  Doing fairly well. No events. Ready for procedure    Objective:   VITALS:   Vitals:   04/29/19 2247 04/30/19 0607  BP: 118/62 (!) 123/91  Pulse: 61 63  Resp: 16 18  Temp: 99.1 F (37.3 C) 99.2 F (37.3 C)  SpO2: 96% 97%    Awake alert Right leg positioned over a pillow for comfort NVI RLE  LABS Recent Labs    04/28/19 1952 04/29/19 0525 04/30/19 0518  HGB 12.4* 11.2* 11.6*  HCT 39.9 36.6* 37.8*  WBC 9.8 7.3 6.8  PLT 162 129* 121*    Recent Labs    04/28/19 1952 04/29/19 0525 04/30/19 0518  NA 143 140 135  K 4.3 4.1 3.7  BUN 22 20 17   CREATININE 1.46* 1.16 1.11  GLUCOSE 94 96 102*    Recent Labs    04/28/19 2240  INR 1.5*     Assessment/Plan: Day of Surgery Procedure(s) (LRB): Right femoral neck fracture   Plan: To OR today for right hip hemiarthroplasty NPO preo-op meds and consent ordered Reviewed surgical plan and post op course and expectations

## 2019-04-30 NOTE — Progress Notes (Signed)
Pt declined nocturnal cpap.  Pt stated he hasn't used one in years.  Pt prefers wearing the nasal cannula instead and does not want to wear cpap while here in the hospital.  Pt was encouraged to call should he change his mind.  RN aware.

## 2019-04-30 NOTE — Telephone Encounter (Signed)
New Message     Rolanda Lundborg is calling from [employer] and is faxing a form that needs to be filled out for the pt who has a Device  She says it is pretty urgent she gets the form filled out today

## 2019-04-30 NOTE — Telephone Encounter (Signed)
Form faxed back as requested, confirmation received.

## 2019-04-30 NOTE — Anesthesia Preprocedure Evaluation (Signed)
Anesthesia Evaluation  Patient identified by MRN, date of birth, ID band Patient awake    Reviewed: Allergy & Precautions, NPO status , Patient's Chart, lab work & pertinent test results, reviewed documented beta blocker date and time   History of Anesthesia Complications (+) PONV and history of anesthetic complications  Airway Mallampati: II  TM Distance: >3 FB Neck ROM: Full    Dental  (+) Teeth Intact, Dental Advisory Given   Pulmonary sleep apnea and Continuous Positive Airway Pressure Ventilation , former smoker,    Pulmonary exam normal breath sounds clear to auscultation       Cardiovascular hypertension, Pt. on home beta blockers and Pt. on medications + CAD, + Cardiac Stents and +CHF  + dysrhythmias Atrial Fibrillation + pacemaker (Medtronic) + Valvular Problems/Murmurs AS  Rhythm:Regular Rate:Normal + Systolic murmurs CAD s/p stents to LAD and circumflex in 4401, chronic systolic CHF and cardiomyopathy EF 30-35%, moderate aortic stenosis, PAF on Xarelto, Medtronic permanent pacemaker for second-degree heart block   Neuro/Psych negative neurological ROS  negative psych ROS   GI/Hepatic Neg liver ROS, hiatal hernia,   Endo/Other  Hypothyroidism   Renal/GU Renal InsufficiencyRenal disease     Musculoskeletal  (+) Arthritis , RIGHT FEMORAL NECK FRACTURE   Abdominal   Peds  Hematology  (+) Blood dyscrasia (Xarelto, thrombocytopenia), anemia ,   Anesthesia Other Findings Day of surgery medications reviewed with the patient.  Reproductive/Obstetrics                             Anesthesia Physical Anesthesia Plan  ASA: IV  Anesthesia Plan: General   Post-op Pain Management:    Induction: Intravenous  PONV Risk Score and Plan: 3 and Dexamethasone and Ondansetron  Airway Management Planned: Oral ETT  Additional Equipment:   Intra-op Plan:   Post-operative Plan: Extubation in  OR  Informed Consent: I have reviewed the patients History and Physical, chart, labs and discussed the procedure including the risks, benefits and alternatives for the proposed anesthesia with the patient or authorized representative who has indicated his/her understanding and acceptance.     Dental advisory given  Plan Discussed with: CRNA  Anesthesia Plan Comments:         Anesthesia Quick Evaluation

## 2019-04-30 NOTE — Progress Notes (Signed)
PROGRESS NOTE    Gregory Mccormick D Ciulla  BJY:[id number]:[id number] DOB: 1929/03/13 DOA: 04/28/2019 PCP: Tally JoeSwayne, David, MD    Brief Narrative:  HPI per Dr. Ernest HaberKim Moises Jean Mccormick  is a 85 y.o. male,w hypertension, hyperlipidemia, h/o mobitz type 2, s/p pacer, chronic diastolic CHF,Pafib, who presents with mechanical fall, and c/o right hip pain.  Pt denies syncope, cp, palp, sob, n/v, abd pain, diarrhea, brbpr, dysuria.    In ED,  T 98.3, P 58  R 14  Bp 141/60  Pox 97% Wt 83.9 kg  Xray R hip IMPRESSION: There is an impacted subcapital fracture of the right femoral neck. No other radiographic evidence of fracture of the pelvis or proximal left femur. Osteopenia.  CXR IMPRESSION: Cardiomegaly without acute abnormality of the lungs. Hiatal hernia.  CT brain IMPRESSION: 1. No acute intracranial abnormality identified. 2. Stable chronic microvascular ischemic changes and parenchymal volume loss of the brain.  Wbc 9.8, Hgb 12.4, Plt 162 Na 143, K 4.3, Bun 22, Creatinine 1.46 Glucose 94  ekg nsr at 60, nl axis, nl int,  Poor R progression, + lvh,   Pt given lorazepam and morphine iv in ED  ED consulted orthopedics,   Pt will be admitted for evaluation of right hip fracture.    Assessment & Plan:   Principal Problem:   Fracture of femoral neck, right, closed (HCC) Active Problems:   Essential hypertension, benign   Hyperlipidemia   Atrial fibrillation (HCC)   BPH (benign prostatic hyperplasia)   OSA on CPAP   Chronic diastolic (congestive) heart failure (HCC)   Closed right hip fracture (HCC)   Chronic systolic CHF (congestive heart failure) (HCC)   Hypothyroidism   Moderate aortic stenosis  1 right femoral neck fracture Secondary to mechanical fall.  Patient denies any recent chest pain or shortness of breath.  Patient states able to go up and down a spiral staircase without any complaints of chest pain or shortness of breath.  Patient with complicated cardiac history with  moderate aortic stenosis, chronic systolic heart failure/cardiomyopathy with a EF of 30 to 35% per 2D echo of 09/28/2018, history of paroxysmal atrial fibrillation, history of Mobitz type II heart block status post PPM.  Due to complicated cardiac history cardiology was consulted for preop clearance.  Patient seen in consultation by Dr. Delton SeeNelson and is noted that patient is at least moderate risk for surgery given LV dysfunction and advanced age.  Patient's revised cardiac index risk score 6.6% indicating moderate risk as well.  Patient noted to be able to exceed 4 METS without any shortness of breath or angina.  Patient cleared by cardiology for surgery.  Continue to hold Xarelto and orthopedics to advise when it may be resumed.  Patient to the OR today for right bipolar hip hemiarthroplasty per Dr. Charlann Boxerlin of orthopedics.  PT/OT postop.  Per orthopedics.    2.  Paroxysmal atrial fibrillation Rate controlled.  Patient with history of Mobitz type II status post PPM.  Patient placed back on his home regimen of amiodarone and metoprolol for rate control.  Patient was on anticoagulation with Xarelto which was held throughout the hospitalization in anticipation of surgery today 04/30/2019.  Orthopedics to advise when patient may be resumed back on his Xarelto postoperatively.   3.  Chronic systolic heart failure/moderate aortic stenosis Currently stable.  2D echo from 09/28/2018 with a EF of 30 to 35% with diffuse hypokinesis and moderate aortic stenosis..  Patient euvolemic on examination.  Continue Lasix, metoprolol, amiodarone, Entresto, statin,  fish oil.  Will need outpatient follow-up.   4.  Obstructive sleep apnea CPAP nightly.  5.  Hyperlipidemia Fasting lipid panel with LDL of 50.  Continue statin, fish oil.  Outpatient follow-up.   6.  Hypertension Well-controlled.  Continue current regimen of metoprolol and Entresto.    7.  BPH Stable.  Continue Flomax.     DVT prophylaxis: SCDs.  Code Status:  Full Family Communication: Updated patient.  No family at bedside. Disposition Plan: To be determined postoperatively.  Likely SNF versus home with home health   Consultants:   Orthopedics: Dr. Charlann Boxer 04/29/2019  Cardiology: Dr. Delton See 04/30/2019  Procedures:   Plain films of the right hip and pelvis 04/28/2019  Antimicrobials:  None   Subjective: Patient laying in bed.  Complaining of being hungry.  Ready to have his surgery.  Denies any chest pain or shortness of breath.  Complaining of right hip pain when he moves.  Objective: Vitals:   04/29/19 1407 04/29/19 2247 04/30/19 0607 04/30/19 0611  BP: (!) 127/58 118/62 (!) 123/91   Pulse: 62 61 63   Resp: 14 16 18    Temp: 98.2 F (36.8 C) 99.1 F (37.3 C) 99.2 F (37.3 C)   TempSrc: Oral Oral Oral   SpO2: 96% 96% 97%   Weight:    85.2 kg  Height:        Intake/Output Summary (Last 24 hours) at 04/30/2019 1058 Last data filed at 04/30/2019 0800 Gross per 24 hour  Intake 1134.54 ml  Output 1650 ml  Net -515.46 ml   Filed Weights   04/28/19 1757 04/29/19 0500 04/30/19 0611  Weight: 83.9 kg 84.2 kg 85.2 kg    Examination:  General exam: NAD Respiratory system: Clear to auscultation bilaterally anterior lung fields.  No wheezes, no crackles, no rhonchi.  Cardiovascular system: Regular rate rhythm no murmurs rubs or gallops.  No JVD.  No lower extremity edema. Gastrointestinal system: Abdomen is soft, nontender, nondistended, positive bowel sounds.  No rebound.  No guarding.  Central nervous system: Alert and oriented. No focal neurological deficits. Extremities: Symmetric 5 x 5 power. Skin: No rashes, lesions or ulcers Psychiatry: Judgement and insight appear normal. Mood & affect appropriate.     Data Reviewed: I have personally reviewed following labs and imaging studies  CBC: Recent Labs  Lab 04/28/19 1952 04/29/19 0525 04/30/19 0518  WBC 9.8 7.3 6.8  NEUTROABS 8.7*  --   --   HGB 12.4* 11.2* 11.6*  HCT  39.9 36.6* 37.8*  MCV 101.8* 102.2* 102.4*  PLT 162 129* 121*   Basic Metabolic Panel: Recent Labs  Lab 04/28/19 1952 04/29/19 0525 04/30/19 0518  NA 143 140 135  K 4.3 4.1 3.7  CL 106 108 102  CO2 27 24 24   GLUCOSE 94 96 102*  BUN 22 20 17   CREATININE 1.46* 1.16 1.11  CALCIUM 8.9 8.2* 8.1*   GFR: Estimated Creatinine Clearance: 48.9 mL/min (by C-G formula based on SCr of 1.11 mg/dL). Liver Function Tests: Recent Labs  Lab 04/29/19 0525  AST 21  ALT 20  ALKPHOS 53  BILITOT 1.1  PROT 5.5*  ALBUMIN 3.2*   No results for input(s): LIPASE, AMYLASE in the last 168 hours. No results for input(s): AMMONIA in the last 168 hours. Coagulation Profile: Recent Labs  Lab 04/28/19 2240  INR 1.5*   Cardiac Enzymes: No results for input(s): CKTOTAL, CKMB, CKMBINDEX, TROPONINI in the last 168 hours. BNP (last 3 results) No results for input(s): PROBNP  in the last 8760 hours. HbA1C: No results for input(s): HGBA1C in the last 72 hours. CBG: No results for input(s): GLUCAP in the last 168 hours. Lipid Profile: Recent Labs    04/30/19 0518  CHOL 108  HDL 52  LDLCALC 50  TRIG 29  CHOLHDL 2.1   Thyroid Function Tests: Recent Labs    04/29/19 0525  TSH 3.504   Anemia Panel: No results for input(s): VITAMINB12, FOLATE, FERRITIN, TIBC, IRON, RETICCTPCT in the last 72 hours. Sepsis Labs: No results for input(s): PROCALCITON, LATICACIDVEN in the last 168 hours.  Recent Results (from the past 240 hour(s))  SARS Coronavirus 2 (CEPHEID - Performed in [facility] lab), Hosp Order     Status: None   Collection Time: 04/28/19  7:52 PM   Specimen: Nasopharyngeal Swab  Result Value Ref Range Status   SARS Coronavirus 2 NEGATIVE NEGATIVE Final    Comment: (NOTE) If result is NEGATIVE SARS-CoV-2 target nucleic acids are NOT DETECTED. The SARS-CoV-2 RNA is generally detectable in upper and lower  respiratory specimens during the acute phase of infection. The lowest   concentration of SARS-CoV-2 viral copies this assay can detect is 250  copies / mL. A negative result does not preclude SARS-CoV-2 infection  and should not be used as the sole basis for treatment or other  patient management decisions.  A negative result may occur with  improper specimen collection / handling, submission of specimen other  than nasopharyngeal swab, presence of viral mutation(s) within the  areas targeted by this assay, and inadequate number of viral copies  (<250 copies / mL). A negative result must be combined with clinical  observations, patient history, and epidemiological information. If result is POSITIVE SARS-CoV-2 target nucleic acids are DETECTED. The SARS-CoV-2 RNA is generally detectable in upper and lower  respiratory specimens dur ing the acute phase of infection.  Positive  results are indicative of active infection with SARS-CoV-2.  Clinical  correlation with patient history and other diagnostic information is  necessary to determine patient infection status.  Positive results do  not rule out bacterial infection or co-infection with other viruses. If result is PRESUMPTIVE POSTIVE SARS-CoV-2 nucleic acids MAY BE PRESENT.   A presumptive positive result was obtained on the submitted specimen  and confirmed on repeat testing.  While 2019 novel coronavirus  (SARS-CoV-2) nucleic acids may be present in the submitted sample  additional confirmatory testing may be necessary for epidemiological  and / or clinical management purposes  to differentiate between  SARS-CoV-2 and other Sarbecovirus currently known to infect humans.  If clinically indicated additional testing with an alternate test  methodology [phone]([id number]) is advised. The SARS-CoV-2 RNA is generally  detectable in upper and lower respiratory sp ecimens during the acute  phase of infection. The expected result is Negative. Fact Sheet for Patients:  BoilerBrush.com.cyhttps://www.fda.gov/media/136312/download Fact Sheet  for Healthcare Providers: https://pope.com/https://www.fda.gov/media/136313/download This test is not yet approved or cleared by the [place] FDA and has been authorized for detection and/or diagnosis of SARS-CoV-2 by FDA under an Emergency Use Authorization (EUA).  This EUA will remain in effect (meaning this test can be used) for the duration of the COVID-19 declaration under Section 564(b)(1) of the Act, 21 U.S.C. section 360bbb-3(b)(1), unless the authorization is terminated or revoked sooner. Performed at [facility], [address] [place], [place] [address]   MRSA PCR Screening     Status: None   Collection Time: 04/29/19  5:47 PM   Specimen:  Nasal Mucosa; Nasopharyngeal  Result Value Ref Range Status   MRSA by PCR NEGATIVE NEGATIVE Final    Comment:        The GeneXpert MRSA Assay (FDA approved for NASAL specimens only), is one component of a comprehensive MRSA colonization surveillance program. It is not intended to diagnose MRSA infection nor to guide or monitor treatment for MRSA infections. Performed at [facility], [place] [address], [place], [place] [address]          Radiology Studies: Ct Head Wo Contrast  Result Date: 04/28/2019 CLINICAL DATA:  85 y/o M; fall with head trauma, minor, pt on anticoagulation. EXAM: CT HEAD WITHOUT CONTRAST TECHNIQUE: Contiguous axial images were obtained from the base of the skull through the vertex without intravenous contrast. COMPARISON:  11/27/2012 CT head. FINDINGS: Brain: No evidence of acute infarction, hemorrhage, hydrocephalus, extra-axial collection or mass lesion/mass effect. Stable nonspecific white matter hypodensities compatible with chronic microvascular ischemic changes and stable volume loss of the brain. Vascular: Calcific atherosclerosis of the internal carotid arteries and the vertebral arteries. No hyperdense vessel. Skull: Normal. Negative for fracture or focal lesion. Sinuses/Orbits:  Included paranasal sinuses and left mastoid air cells are normally aerated. Right wall down mastoidectomy chronic postsurgical changes with stable fat attenuating soft tissue posterior mastoidectomy bowl. Other: Bilateral intra-ocular lens replacement. IMPRESSION: 1. No acute intracranial abnormality identified. 2. Stable chronic microvascular ischemic changes and parenchymal volume loss of the brain. Electronically Signed   By: Kristine Garbe M.D.   On: 04/28/2019 20:17   Dg Chest Portable 1 View  Result Date: 04/28/2019 CLINICAL DATA:  Preoperative hip fracture EXAM: PORTABLE CHEST 1 VIEW COMPARISON:  10/06/2010 FINDINGS: Cardiomegaly with left chest multi lead pacer. Hiatal hernia. Both lungs are clear. The visualized skeletal structures are unremarkable. IMPRESSION: Cardiomegaly without acute abnormality of the lungs.  Hiatal hernia. Electronically Signed   By: Eddie Candle M.D.   On: 04/28/2019 19:12   Dg Hip Unilat  With Pelvis 2-3 Views Right  Result Date: 04/28/2019 CLINICAL DATA:  Fall, pain EXAM: DG HIP (WITH OR WITHOUT PELVIS) 2-3V RIGHT COMPARISON:  None. FINDINGS: There is an impacted subcapital fracture of the right femoral neck. No other radiographic evidence of fracture of the pelvis or proximal left femur. Osteopenia. IMPRESSION: There is an impacted subcapital fracture of the right femoral neck. No other radiographic evidence of fracture of the pelvis or proximal left femur. Osteopenia. Electronically Signed   By: Eddie Candle M.D.   On: 04/28/2019 18:34        Scheduled Meds: . amiodarone  200 mg Oral Daily  . atorvastatin  10 mg Oral q1800  . chlorhexidine  60 mL Topical Once  . furosemide  20 mg Oral Daily  . hydrocortisone cream   Topical QID  . metoprolol succinate  25 mg Oral Daily  . omega-3 acid ethyl esters  1 g Oral Daily  . povidone-iodine  2 application Topical Once  . sacubitril-valsartan  1 tablet Oral BID  . tamsulosin  0.4 mg Oral Daily    Continuous Infusions: . sodium chloride 50 mL/hr at 04/29/19 2004  .  ceFAZolin (ANCEF) IV    . tranexamic acid       LOS: 2 days    Time spent: 35 minutes    Irine Seal, MD [facility]  If 7PM-7AM, please contact night-coverage [email or web address] 04/30/2019, 10:58 AM

## 2019-05-01 ENCOUNTER — Encounter (HOSPITAL_COMMUNITY): Payer: Self-pay | Admitting: Orthopedic Surgery

## 2019-05-01 DIAGNOSIS — Z96649 Presence of unspecified artificial hip joint: Secondary | ICD-10-CM

## 2019-05-01 LAB — CBC
HCT: 38.1 % — ABNORMAL LOW (ref 39.0–52.0)
Hemoglobin: 12.2 g/dL — ABNORMAL LOW (ref 13.0–17.0)
MCH: 32.2 pg (ref 26.0–34.0)
MCHC: 32 g/dL (ref 30.0–36.0)
MCV: 100.5 fL — ABNORMAL HIGH (ref 80.0–100.0)
Platelets: 131 10*3/uL — ABNORMAL LOW (ref 150–400)
RBC: 3.79 MIL/uL — ABNORMAL LOW (ref 4.22–5.81)
RDW: 13.6 % (ref 11.5–15.5)
WBC: 7.1 10*3/uL (ref 4.0–10.5)
nRBC: 0 % (ref 0.0–0.2)

## 2019-05-01 MED ORDER — FERROUS SULFATE 325 (65 FE) MG PO TABS: 325.0000 mg | ORAL_TABLET | Freq: Three times a day (TID) | ORAL | 0 refills | Status: DC

## 2019-05-01 MED ORDER — HYDROCODONE-ACETAMINOPHEN 7.5-325 MG PO TABS: 1.0000 | ORAL_TABLET | ORAL | 0 refills | Status: DC | PRN

## 2019-05-01 MED ORDER — METHOCARBAMOL 500 MG PO TABS: 500.0000 mg | ORAL_TABLET | Freq: Four times a day (QID) | ORAL | 0 refills | Status: DC | PRN

## 2019-05-01 NOTE — Plan of Care (Signed)

## 2019-05-01 NOTE — Anesthesia Postprocedure Evaluation (Signed)
Anesthesia Post Note  Patient: KARSTEN HOWRY  Procedure(s) Performed: ARTHROPLASTY BIPOLAR HIP (HEMIARTHROPLASTY) (Right Hip)     Patient location during evaluation: PACU Anesthesia Type: General Level of consciousness: awake and alert Pain management: pain level controlled Vital Signs Assessment: post-procedure vital signs reviewed and stable Respiratory status: spontaneous breathing, nonlabored ventilation, respiratory function stable and patient connected to nasal cannula oxygen Cardiovascular status: blood pressure returned to baseline and stable Postop Assessment: no apparent nausea or vomiting Anesthetic complications: no    Last Vitals:  Vitals:   05/01/19 0857 05/01/19 1212  BP: 103/61 110/70  Pulse: 61 60  Resp: (!) 22 19  Temp: 37.1 C 36.6 C  SpO2: 96% 98%    Last Pain:  Vitals:   05/01/19 1212  TempSrc: Oral  PainSc:                  Catalina Gravel

## 2019-05-01 NOTE — Progress Notes (Signed)
Physical Therapy Treatment Patient Details Name: Gregory Mccormick MRN: [id number] DOB: Dec 07, 1928 Today's Date: 05/01/2019    History of Present Illness s/p R hemiarthroplasty 2* fall.  PMH:  Pacemaker, PAF, CHF, CKD, Cardiomyopathy, and CAD    PT Comments    Pt is making steady progress with mobility. Pt reports he would like to d/c home today. Pt is at a supervision level with mobility. Pt reports his wife can provide assistance as needed. Pt has demonstrated good understanding of HEP and safety awareness with mobility using a RW. Pt verbalized step technique using a RW. Pt is ready for d/c home when medically cleared. Pt will continue to benefit from skilled PT until d/c from the hospital.   Follow Up Recommendations  Follow surgeon's recommendation for DC plan and follow-up therapies     Equipment Recommendations  Rolling walker with 5" wheels    Recommendations for Other Services       Precautions / Restrictions Precautions Precautions: Posterior Hip Precaution Comments: reviewed THP Restrictions RLE Weight Bearing: Weight bearing as tolerated    Mobility  Bed Mobility Overal bed mobility: Modified Independent Bed Mobility: Sit to Supine     Supine to sit: Modified independent (Device/Increase time);HOB elevated Sit to supine: Modified independent (Device/Increase time);HOB elevated   General bed mobility comments: uses UE to assist lifting R LE up into the bed  Transfers Overall transfer level: Needs assistance Equipment used: Rolling walker (2 wheeled) Transfers: Sit to/from Stand Sit to Stand: Min guard         General transfer comment: cues for hand placememt  Ambulation/Gait Ambulation/Gait assistance: Supervision Gait Distance (Feet): 200 Feet Assistive device: Rolling walker (2 wheeled) Gait Pattern/deviations: Step-through pattern;Decreased stride length;Decreased weight shift to right Gait velocity: decreased       Stairs Stairs: Yes   Stair  Management: No rails;With walker;Backwards;Forwards   General stair comments: verbally reviewed technique. Pt able to correctly verballize technique. Pt had previous knee surgeries and reports he kows how to do it. Pt did not demonstrate technique due to not having a step available to practice on this unit and pt was able to accurately recall technique.   Wheelchair Mobility    Modified Rankin (Stroke Patients Only)       Balance Overall balance assessment: Needs assistance   Sitting balance-Leahy Scale: Normal     Standing balance support: Bilateral upper extremity supported Standing balance-Leahy Scale: Fair                              Cognition Arousal/Alertness: Awake/alert Behavior During Therapy: WFL for tasks assessed/performed Overall Cognitive Status: Within Functional Limits for tasks assessed                                        Exercises Total Joint Exercises Ankle Circles/Pumps: AROM;Strengthening;Both;10 reps;Seated Quad Sets: AROM;Strengthening;Both;10 reps;Seated Short Arc Quad: AROM;Strengthening;Right;10 reps;Supine Heel Slides: AROM;Strengthening;Right;10 reps;Supine Hip ABduction/ADduction: AROM;Strengthening;Right;10 reps;Supine    General Comments        Pertinent Vitals/Pain Pain Score: 1  Pain Location: R hip Pain Descriptors / Indicators: Discomfort Pain Intervention(s): Limited activity within patient's tolerance;Monitored during session    Home Living                      Prior Function  PT Goals (current goals can now be found in the care plan section) Progress towards PT goals: Progressing toward goals    Frequency    7X/week      PT Plan Current plan remains appropriate    Co-evaluation              AM-PAC PT "6 Clicks" Mobility   Outcome Measure  Help needed turning from your back to your side while in a flat bed without using bedrails?: None Help needed  moving from lying on your back to sitting on the side of a flat bed without using bedrails?: A Little Help needed moving to and from a bed to a chair (including a wheelchair)?: A Little Help needed standing up from a chair using your arms (e.g., wheelchair or bedside chair)?: A Little Help needed to walk in hospital room?: A Little Help needed climbing 3-5 steps with a railing? : A Little 6 Click Score: 19    End of Session Equipment Utilized During Treatment: Gait belt Activity Tolerance: Patient tolerated treatment well Patient left: in bed;with call bell/phone within reach Nurse Communication: Mobility status PT Visit Diagnosis: Difficulty in walking, not elsewhere classified (R26.2)     Time: 9381-0175 PT Time Calculation (min) (ACUTE ONLY): 31 min  Charges:  $Gait Training: 8-22 mins $Therapeutic Exercise: 8-22 mins                     Theodoro Grist, PT   Lelon Mast 05/01/2019, 2:35 PM

## 2019-05-01 NOTE — Care Management Important Message (Signed)
Important Message  Patient Details IM Letter given to Dessa Phi RN to present to the Patient Name: Gregory Mccormick MRN: [id number] Date of Birth: 1929/10/09   Medicare Important Message Given:  Yes     Kerin Salen 05/01/2019, 12:58 PM

## 2019-05-01 NOTE — Evaluation (Signed)
Occupational Therapy Evaluation Patient Details Name: Gregory Mccormick MRN: [id number] DOB: May 27, 1929 Today's Date: 05/01/2019    History of Present Illness s/p R hemiarthroplasty 2* fall.  PMH:  Pacemaker, PAF, CHF, CKD, Cardiomyopathy, and CAD       Clinical Impression   This 85 year old man was admitted for the above. At baseline, he is very independent.  Pt needs mod to max A for LB adls due to precautions. He got up with min A, and this was his first time OOB.  Will follow in acute setting with the goals listed below    Follow Up Recommendations  Supervision/Assistance - 24 hour    Equipment Recommendations  None recommended by OT    Recommendations for Other Services       Precautions / Restrictions Precautions Precautions: Posterior Hip Restrictions RLE Weight Bearing: Weight bearing as tolerated      Mobility Bed Mobility Overal bed mobility: Needs Assistance Bed Mobility: Supine to Sit     Supine to sit: Min assist     General bed mobility comments: for RLE; HOB raised  Transfers Overall transfer level: Needs assistance Equipment used: Rolling walker (2 wheeled) Transfers: Sit to/from [employer] Sit to Stand: Min assist Stand pivot transfers: Min assist       General transfer comment: light assistance to rise with cues for UE/LE placement; steadying assistance for SPT    Balance                                           ADL either performed or assessed with clinical judgement   ADL Overall ADL's : Needs assistance/impaired Eating/Feeding: Independent   Grooming: Set up   Upper Body Bathing: Supervision/ safety   Lower Body Bathing: Moderate assistance;Sit to/from stand   Upper Body Dressing : Supervision/safety   Lower Body Dressing: Maximal assistance;Sit to/from stand   Toilet Transfer: Minimal assistance;Stand-pivot;RW(chair)   Toileting- Clothing Manipulation and Hygiene: Minimal assistance          General ADL Comments: reviewed posterior precautions and showed AE while pt was eating. (long sponge and reacher). Did not use.  Pt can have assistance at home. Pt was exercising legs when I arrived:  told him to wait on lifting leg until PT comes     Vision         Perception     Praxis      Pertinent Vitals/Pain       Hand Dominance Right   Extremity/Trunk Assessment Upper Extremity Assessment Upper Extremity Assessment: Overall WFL for tasks assessed           Communication Communication Communication: HOH   Cognition Arousal/Alertness: Awake/alert Behavior During Therapy: WFL for tasks assessed/performed Overall Cognitive Status: Within Functional Limits for tasks assessed                                     General Comments       Exercises     Shoulder Instructions      Home Living Family/patient expects to be discharged to:: Private residence Living Arrangements: Spouse/significant other Available Help at Discharge: Family Type of Home: House             Bathroom Shower/Tub: Walk-in shower         Home Equipment:  Bedside commode   Additional Comments: 3 sons; one very close      Prior Functioning/Environment Level of Independence: Independent                 OT Problem List: Pain;Decreased knowledge of precautions;Decreased knowledge of use of DME or AE      OT Treatment/Interventions: Self-care/ADL training;DME and/or AE instruction;Patient/family education    OT Goals(Current goals can be found in the care plan section) Acute Rehab OT Goals Patient Stated Goal: return to independence OT Goal Formulation: With patient Time For Goal Achievement: 05/08/19 Potential to Achieve Goals: Good ADL Goals Pt Will Transfer to Toilet: (P) with supervision;bedside commode;ambulating Pt Will Perform Toileting - Clothing Manipulation and hygiene: (P) with supervision;sit to/from stand Pt Will Perform Tub/Shower  Transfer: (P) Shower transfer;with min guard assist;ambulating;3 in 1 Additional ADL Goal #1: (P) pt will verbalize 3/3 posterior thps and verbalize vs demonstrate use of AE vs specific needs of assistance  OT Frequency: Min 2X/week   Barriers to D/C:            Co-evaluation              AM-PAC OT "6 Clicks" Daily Activity     Outcome Measure Help from another person eating meals?: None Help from another person taking care of personal grooming?: A Little Help from another person toileting, which includes using toliet, bedpan, or urinal?: A Little Help from another person bathing (including washing, rinsing, drying)?: A Lot Help from another person to put on and taking off regular upper body clothing?: A Little Help from another person to put on and taking off regular lower body clothing?: A Lot 6 Click Score: 17   End of Session    Activity Tolerance: Patient tolerated treatment well Patient left: in chair;with call bell/phone within reach;with bed alarm set  OT Visit Diagnosis: Pain Pain - Right/Left: Right Pain - part of body: Hip                Time: 5638-7564(3 pa's came in during eval) OT Time Calculation (min): 42 min Charges:  OT General Charges $OT Visit: 1 Visit OT Evaluation $OT Eval Low Complexity: 1 Low OT Treatments $Therapeutic Activity: 8-22 mins  Lesle Chris, OTR/L Acute Rehabilitation Services [phone] [facility] pager [phone] office 05/01/2019  [place] 05/01/2019, 8:51 AM

## 2019-05-01 NOTE — Progress Notes (Signed)
[facility]                                                                              Patient Demographics  Gregory Mccormick, is a 85 y.o. male, DOB - November 05, 1928, DPT:[id number]  Admit date - 04/28/2019   Admitting Physician Jani Gravel, MD  Outpatient Primary MD for the patient is Antony Contras, MD  Outpatient specialists:   LOS - 3  days   Medical records reviewed and are as summarized below:    Chief Complaint  Patient presents with  . Fall  . Hip Pain       Brief summary   Patient is 85 year old male with hypertension, hyperlipidemia, history of Mobitz type II status post pacemaker, chronic diastolic CHF, paroxysmal A. fib had presented with mechanical fall and right hip pain.  X-ray of the right hip showed impacted subcapital fracture of the right femoral neck. Orthopedics was consulted, patient underwent surgery on 6/30    Assessment & Plan    Principal Problem:   Fracture of femoral neck, right, closed ([place]) -Status post mechanical fall -X-ray of the right hip showed impacted subcapital fracture of the right femoral neck -Orthopedics was consulted, patient underwent right hip hemiarthroplasty on 6/30, postop day #1 -Pain currently controlled, continue DVT prophylaxis per orthopedics -Patient motivated to start PT and prefers to go home home with home health PT.    Active Problems:   Essential hypertension, benign -BP currently stable  Chronic systolic and diastolic CHF -Continue Entresto, home dose of Lasix 20 mg daily -Currently euvolemic, compensated  Coronary artery disease status post PCI, LAD, Cx stents in 2014, paroxysmal atrial fibrillation -Currently rate controlled, no anginal symptoms -Cardiology following, no aspirin in the setting of Xarelto -Continue Xarelto, amiodarone and Lopressor  Moderate aortic stenosis -He has been managed conservatively in the setting of lack of symptoms. - cardiology following -History of pacemaker,  on reduced dose of Xarelto 10 mg    BPH (benign prostatic hyperplasia) -Continue Flomax    OSA on CPAP -Continue CPAP at night  Hyperlipidemia -Continue statin  Code Status: Full CODE STATUS DVT Prophylaxis: Xarelto Family Communication: Discussed in detail with the patient, all imaging results, lab results explained to the patient    Disposition Plan: Hopefully DC home once PT goals are met.  Time Spent in minutes 35 minutes  Procedures:  Right hemi-hip arthroplasty on 04/30/2019  Consultants:   Orthopedics  Antimicrobials:   Anti-infectives (From admission, onward)   Start     Dose/Rate Route Frequency Ordered Stop   04/30/19 2100  ceFAZolin (ANCEF) IVPB 2g/100 mL premix     2 g 200 mL/hr over 30 Minutes Intravenous Every 6 hours 04/30/19 1834 05/01/19 0355   04/30/19 0600  ceFAZolin (ANCEF) IVPB 2g/100 mL premix     2 g 200 mL/hr over 30 Minutes Intravenous On call to O.R. 04/29/19 1604 04/30/19 1516          Medications  Scheduled Meds: . amiodarone  200 mg Oral Daily  . atorvastatin  10 mg Oral q1800  . celecoxib  200 mg Oral BID  . dexamethasone  10 mg Intravenous  Once  . docusate sodium  100 mg Oral BID  . ferrous sulfate  325 mg Oral TID PC  . furosemide  20 mg Oral Daily  . hydrocortisone cream   Topical QID  . metoprolol succinate  25 mg Oral Daily  . polyethylene glycol  17 g Oral BID  . rivaroxaban  10 mg Oral Daily  . sacubitril-valsartan  1 tablet Oral BID  . tamsulosin  0.4 mg Oral Daily   Continuous Infusions: . sodium chloride 100 mL/hr at 04/30/19 2100  . methocarbamol (ROBAXIN) IV Stopped (04/30/19 1730)   PRN Meds:.acetaminophen, alum & mag hydroxide-simeth, bisacodyl, diphenhydrAMINE, HYDROcodone-acetaminophen, HYDROcodone-acetaminophen, magnesium citrate, menthol-cetylpyridinium **OR** phenol, methocarbamol **OR** methocarbamol (ROBAXIN) IV, metoCLOPramide **OR** metoCLOPramide (REGLAN) injection, morphine injection, nitroGLYCERIN,  ondansetron **OR** ondansetron (ZOFRAN) IV      Subjective:  Gregory Mccormick was seen and examined today.  Very motivated to start PT, has been exercising in bed, states pain controlled,.  Denies any Patient denies dizziness, chest pain, shortness of breath, abdominal pain, N/V/D/C, new weakness, numbess, tingling. No acute events overnight.     Objective:   Vitals:   05/01/19 0000 05/01/19 0401 05/01/19 0857 05/01/19 1212  BP: (!) 152/76 (!) 141/85 103/61 110/70  Pulse: 61 63 61 60  Resp: 18 18 (!) 22 19  Temp: 98 F (36.7 C) 97.6 F (36.4 C) 98.8 F (37.1 C) 97.8 F (36.6 C)  TempSrc: Oral Oral Oral Oral  SpO2: 98% 98% 96% 98%  Weight:  87 kg    Height:        Intake/Output Summary (Last 24 hours) at 05/01/2019 1226 Last data filed at 05/01/2019 0600 Gross per 24 hour  Intake 3253.54 ml  Output 2050 ml  Net 1203.54 ml     Wt Readings from Last 3 Encounters:  05/01/19 87 kg  02/13/19 83.9 kg  11/20/18 84.7 kg     Exam  General: Alert and oriented x 3, NAD  Eyes:   HEENT:  Atraumatic, normocephalic, normal oropharynx  Cardiovascular: S1 S2 auscultated, Regular rate and rhythm. 4/6 systolic murmur  Respiratory: Clear to auscultation bilaterally, no wheezing, rales or rhonchi  Gastrointestinal: Soft, nontender, nondistended, + bowel sounds  Ext: no pedal edema bilaterally  Neuro: No new deficits  Musculoskeletal: No digital cyanosis, clubbing  Skin: No rashes  Psych: Normal affect and demeanor, alert and oriented x3    Data Reviewed:  I have personally reviewed following labs and imaging studies  Micro Results Recent Results (from the past 240 hour(s))  SARS Coronavirus 2 (CEPHEID - Performed in [place] hospital lab), Hosp Order     Status: None   Collection Time: 04/28/19  7:52 PM   Specimen: Nasopharyngeal Swab  Result Value Ref Range Status   SARS Coronavirus 2 NEGATIVE NEGATIVE Final    Comment: (NOTE) If result is NEGATIVE SARS-CoV-2  target nucleic acids are NOT DETECTED. The SARS-CoV-2 RNA is generally detectable in upper and lower  respiratory specimens during the acute phase of infection. The lowest  concentration of SARS-CoV-2 viral copies this assay can detect is 250  copies / mL. A negative result does not preclude SARS-CoV-2 infection  and should not be used as the sole basis for treatment or other  patient management decisions.  A negative result may occur with  improper specimen collection / handling, submission of specimen other  than nasopharyngeal swab, presence of viral mutation(s) within the  areas targeted by this assay, and inadequate number of viral copies  (<250 copies /  mL). A negative result must be combined with clinical  observations, patient history, and epidemiological information. If result is POSITIVE SARS-CoV-2 target nucleic acids are DETECTED. The SARS-CoV-2 RNA is generally detectable in upper and lower  respiratory specimens dur ing the acute phase of infection.  Positive  results are indicative of active infection with SARS-CoV-2.  Clinical  correlation with patient history and other diagnostic information is  necessary to determine patient infection status.  Positive results do  not rule out bacterial infection or co-infection with other viruses. If result is PRESUMPTIVE POSTIVE SARS-CoV-2 nucleic acids MAY BE PRESENT.   A presumptive positive result was obtained on the submitted specimen  and confirmed on repeat testing.  While 2019 novel coronavirus  (SARS-CoV-2) nucleic acids may be present in the submitted sample  additional confirmatory testing may be necessary for epidemiological  and / or clinical management purposes  to differentiate between  SARS-CoV-2 and other Sarbecovirus currently known to infect humans.  If clinically indicated additional testing with an alternate test  methodology [phone]) is advised. The SARS-CoV-2 RNA is generally  detectable in upper and lower  respiratory sp ecimens during the acute  phase of infection. The expected result is Negative. Fact Sheet for Patients:  StrictlyIdeas.no Fact Sheet for Healthcare Providers: BankingDealers.co.za This test is not yet approved or cleared by the [place] FDA and has been authorized for detection and/or diagnosis of SARS-CoV-2 by FDA under an Emergency Use Authorization (EUA).  This EUA will remain in effect (meaning this test can be used) for the duration of the COVID-19 declaration under Section 564(b)(1) of the Act, 21 U.S.C. section 360bbb-3(b)(1), unless the authorization is terminated or revoked sooner. Performed at [facility], [place] [address], [place], [place] [address]   MRSA PCR Screening     Status: None   Collection Time: 04/29/19  5:47 PM   Specimen: Nasal Mucosa; Nasopharyngeal  Result Value Ref Range Status   MRSA by PCR NEGATIVE NEGATIVE Final    Comment:        The GeneXpert MRSA Assay (FDA approved for NASAL specimens only), is one component of a comprehensive MRSA colonization surveillance program. It is not intended to diagnose MRSA infection nor to guide or monitor treatment for MRSA infections. Performed at [facility], [place] [address] [place], [place] [address]     Radiology Reports Ct Head Wo Contrast  Result Date: 04/28/2019 CLINICAL DATA:  84 y/o M; fall with head trauma, minor, pt on anticoagulation. EXAM: CT HEAD WITHOUT CONTRAST TECHNIQUE: Contiguous axial images were obtained from the base of the skull through the vertex without intravenous contrast. COMPARISON:  11/27/2012 CT head. FINDINGS: Brain: No evidence of acute infarction, hemorrhage, hydrocephalus, extra-axial collection or mass lesion/mass effect. Stable nonspecific white matter hypodensities compatible with chronic microvascular ischemic changes and stable volume loss of the brain. Vascular: Calcific  atherosclerosis of the internal carotid arteries and the vertebral arteries. No hyperdense vessel. Skull: Normal. Negative for fracture or focal lesion. Sinuses/Orbits: Included paranasal sinuses and left mastoid air cells are normally aerated. Right wall down mastoidectomy chronic postsurgical changes with stable fat attenuating soft tissue posterior mastoidectomy bowl. Other: Bilateral intra-ocular lens replacement. IMPRESSION: 1. No acute intracranial abnormality identified. 2. Stable chronic microvascular ischemic changes and parenchymal volume loss of the brain. Electronically Signed   By: Kristine Garbe M.D.   On: 04/28/2019 20:17   Dg Pelvis Portable  Result Date: 04/30/2019 CLINICAL DATA:  Status post right hip arthroplasty. EXAM:  PORTABLE PELVIS 1-2 VIEWS COMPARISON:  Radiograph 04/28/2019 FINDINGS: Right hip hemiarthroplasty in good position without complicating features. The left hip is normally located. IMPRESSION: Right hip hemiarthroplasty in good position without complicating features. Electronically Signed   By: Marijo Sanes M.D.   On: 04/30/2019 17:37   Dg Chest Portable 1 View  Result Date: 04/28/2019 CLINICAL DATA:  Preoperative hip fracture EXAM: PORTABLE CHEST 1 VIEW COMPARISON:  10/06/2010 FINDINGS: Cardiomegaly with left chest multi lead pacer. Hiatal hernia. Both lungs are clear. The visualized skeletal structures are unremarkable. IMPRESSION: Cardiomegaly without acute abnormality of the lungs.  Hiatal hernia. Electronically Signed   By: Eddie Candle M.D.   On: 04/28/2019 19:12   Dg Hip Unilat  With Pelvis 2-3 Views Right  Result Date: 04/28/2019 CLINICAL DATA:  Fall, pain EXAM: DG HIP (WITH OR WITHOUT PELVIS) 2-3V RIGHT COMPARISON:  None. FINDINGS: There is an impacted subcapital fracture of the right femoral neck. No other radiographic evidence of fracture of the pelvis or proximal left femur. Osteopenia. IMPRESSION: There is an impacted subcapital fracture of the  right femoral neck. No other radiographic evidence of fracture of the pelvis or proximal left femur. Osteopenia. Electronically Signed   By: Eddie Candle M.D.   On: 04/28/2019 18:34    Lab Data:  CBC: Recent Labs  Lab 04/28/19 1952 04/29/19 0525 04/30/19 0518 05/01/19 0507  WBC 9.8 7.3 6.8 7.1  NEUTROABS 8.7*  --   --   --   HGB 12.4* 11.2* 11.6* 12.2*  HCT 39.9 36.6* 37.8* 38.1*  MCV 101.8* 102.2* 102.4* 100.5*  PLT 162 129* 121* 068*   Basic Metabolic Panel: Recent Labs  Lab 04/28/19 1952 04/29/19 0525 04/30/19 0518  NA 143 140 135  K 4.3 4.1 3.7  CL 106 108 102  CO2 _0 GLUCOSE 94 96 102*  BUN _1 CREATININE 1.46* 1.16 1.11  CALCIUM 8.9 8.2* 8.1*   GFR: Estimated Creatinine Clearance: 49.3 mL/min (by C-G formula based on SCr of 1.11 mg/dL). Liver Function Tests: Recent Labs  Lab 04/29/19 0525  AST 21  ALT 20  ALKPHOS 53  BILITOT 1.1  PROT 5.5*  ALBUMIN 3.2*   No results for input(s): LIPASE, AMYLASE in the last 168 hours. No results for input(s): AMMONIA in the last 168 hours. Coagulation Profile: Recent Labs  Lab 04/28/19 2240  INR 1.5*   Cardiac Enzymes: No results for input(s): CKTOTAL, CKMB, CKMBINDEX, TROPONINI in the last 168 hours. BNP (last 3 results) No results for input(s): PROBNP in the last 8760 hours. HbA1C: No results for input(s): HGBA1C in the last 72 hours. CBG: No results for input(s): GLUCAP in the last 168 hours. Lipid Profile: Recent Labs    04/30/19 0518  CHOL 108  HDL 52  LDLCALC 50  TRIG 29  CHOLHDL 2.1   Thyroid Function Tests: Recent Labs    04/29/19 0525  TSH 3.504   Anemia Panel: No results for input(s): VITAMINB12, FOLATE, FERRITIN, TIBC, IRON, RETICCTPCT in the last 72 hours. Urine analysis:    Component Value Date/Time   COLORURINE YELLOW 04/28/2019 2046   APPEARANCEUR CLEAR 04/28/2019 2046   LABSPEC 1.016 04/28/2019 2046   PHURINE 7.0 04/28/2019 2046   GLUCOSEU NEGATIVE 04/28/2019  2046   HGBUR NEGATIVE 04/28/2019 2046   BILIRUBINUR NEGATIVE 04/28/2019 2046   KETONESUR 5 (A) 04/28/2019 2046   PROTEINUR NEGATIVE 04/28/2019 2046   UROBILINOGEN 0.2 11/20/2012 1324   NITRITE NEGATIVE 04/28/2019 2046  LEUKOCYTESUR NEGATIVE 04/28/2019 2046     Ripudeep Rai M.D. [facility] 05/01/2019, 12:26 PM  Pager: [phone] Between 7am to 7pm - call Pager - [phone]  After 7pm go to [email or web address] - password [email or web address]  Call night coverage person covering after 7pm

## 2019-05-01 NOTE — Progress Notes (Signed)
Patient continues to decline nocturnal CPAP. Order changed to prn per RT protocol.  

## 2019-05-01 NOTE — Progress Notes (Signed)
Subjective: 1 Day Post-Op Procedure(s) (LRB): ARTHROPLASTY BIPOLAR HIP (HEMIARTHROPLASTY) (Right) Patient reports pain as mild.   Patient seen in rounds for Dr. Charlann Boxer. Patient is well, and has had no acute complaints or problems other than pain in the right hip. No acute events overnight. Voiding without difficulty, positive flatus. Patient is very motivated to return to activity. We will start therapy today.   Objective: Vital signs in last 24 hours: Temp:  [97.6 F (36.4 C)-99.4 F (37.4 C)] 98.8 F (37.1 C) (07/01 0857) Pulse Rate:  [58-63] 61 (07/01 0857) Resp:  [12-22] 22 (07/01 0857) BP: (94-158)/(60-89) 103/61 (07/01 0857) SpO2:  [94 %-99 %] 96 % (07/01 0857) Weight:  [87 kg] 87 kg (07/01 0401)  Intake/Output from previous day:  Intake/Output Summary (Last 24 hours) at 05/01/2019 0928 Last data filed at 05/01/2019 0600 Gross per 24 hour  Intake 3253.54 ml  Output 2050 ml  Net 1203.54 ml     Intake/Output this shift: No intake/output data recorded.  Labs: Recent Labs    04/28/19 1952 04/29/19 0525 04/30/19 0518 05/01/19 0507  HGB 12.4* 11.2* 11.6* 12.2*   Recent Labs    04/30/19 0518 05/01/19 0507  WBC 6.8 7.1  RBC 3.69* 3.79*  HCT 37.8* 38.1*  PLT 121* 131*   Recent Labs    04/29/19 0525 04/30/19 0518  NA 140 135  K 4.1 3.7  CL 108 102  CO2 24 24  BUN 20 17  CREATININE 1.16 1.11  GLUCOSE 96 102*  CALCIUM 8.2* 8.1*   Recent Labs    04/28/19 2240  INR 1.5*    Exam: General - Patient is Alert and Oriented Extremity - Neurologically intact Sensation intact distally Intact pulses distally Dorsiflexion/Plantar flexion intact Dressing - dressing C/D/I Motor Function - intact, moving foot and toes well on exam.   Past Medical History:  Diagnosis Date  . Anemia, unspecified   . Aortic stenosis   . Arthritis    "right knee" (03/28/2013  . CAD in native artery    takes Xarelto daily  . Cardiomyopathy (HCC)    EF 40%:  ECHO 11/25/2015 EF  55%-60%  . Carpal tunnel syndrome, bilateral   . Chronic systolic CHF (congestive heart failure) (HCC)   . CKD (chronic kidney disease), stage II   . Epistaxis   . Essential hypertension, benign    takes Metoprolol daily  . H/O hiatal hernia   . History of kidney stones   . Hyperlipidemia    takes Lipitor daily  . Nonspecific abnormal unspecified cardiovascular function study   . OSA on CPAP    wears CPAP 02/03/16  . Pacemaker    MDT  . PAF (paroxysmal atrial fibrillation) (HCC)   . Paroxysmal ventricular tachycardia (HCC)   . PONV (postoperative nausea and vomiting)   . PVC's (premature ventricular contractions)   . Vitamin D deficiency     Assessment/Plan: 1 Day Post-Op Procedure(s) (LRB): ARTHROPLASTY BIPOLAR HIP (HEMIARTHROPLASTY) (Right) Principal Problem:   Fracture of femoral neck, right, closed (HCC) Active Problems:   Essential hypertension, benign   Hyperlipidemia   Atrial fibrillation (HCC)   BPH (benign prostatic hyperplasia)   OSA on CPAP   Chronic diastolic (congestive) heart failure (HCC)   Closed right hip fracture (HCC)   Chronic systolic CHF (congestive heart failure) (HCC)   Hypothyroidism   Moderate aortic stenosis   S/P right hip hemiarthroplasty  Estimated body mass index is 29.15 kg/m as calculated from the following:   Height as  of this encounter: 5\' 8"  (1.727 m).   Weight as of this encounter: 87 kg. Advance diet Up with therapy  DVT Prophylaxis - Xarelto Weight bearing as tolerated Begin therapy Hip precautions discussed with patient  Plan is to go Home after hospital stay. Patient is doing very well this morning, and reports minimal pain in the hip. He is highly motivated to return to activity. We discussed posterior hip precautions today, and he verbalized understanding. He will work with therapy today. He will be ready to discharge from an Orthopedic standpoint as long as he is meeting goals with therapy. We will arrange for OPPT. He  will follow up in the office in 2 weeks with Dr. Alvan Dame.   Griffith Citron, PA-C Orthopedic Surgery 05/01/2019, 9:28 AM

## 2019-05-01 NOTE — Progress Notes (Addendum)
Progress Note  Patient Name: Gregory Mccormick Date of Encounter: 05/01/2019  Primary Cardiologist: Lance Muss, MD   Subjective   Recovering well. No cardiac complaints.  Inpatient Medications    Scheduled Meds: . amiodarone  200 mg Oral Daily  . atorvastatin  10 mg Oral q1800  . celecoxib  200 mg Oral BID  . dexamethasone  10 mg Intravenous Once  . docusate sodium  100 mg Oral BID  . ferrous sulfate  325 mg Oral TID PC  . furosemide  20 mg Oral Daily  . hydrocortisone cream   Topical QID  . metoprolol succinate  25 mg Oral Daily  . polyethylene glycol  17 g Oral BID  . rivaroxaban  10 mg Oral Daily  . sacubitril-valsartan  1 tablet Oral BID  . tamsulosin  0.4 mg Oral Daily   Continuous Infusions: . sodium chloride 100 mL/hr at 04/30/19 2100  . methocarbamol (ROBAXIN) IV Stopped (04/30/19 1730)   PRN Meds: acetaminophen, alum & mag hydroxide-simeth, bisacodyl, diphenhydrAMINE, HYDROcodone-acetaminophen, HYDROcodone-acetaminophen, magnesium citrate, menthol-cetylpyridinium **OR** phenol, methocarbamol **OR** methocarbamol (ROBAXIN) IV, metoCLOPramide **OR** metoCLOPramide (REGLAN) injection, morphine injection, nitroGLYCERIN, ondansetron **OR** ondansetron (ZOFRAN) IV   Vital Signs    Vitals:   04/30/19 2001 04/30/19 2115 05/01/19 0000 05/01/19 0401  BP: 94/60 110/62 (!) 152/76 (!) 141/85  Pulse: 60 60 61 63  Resp: 18 18 18 18   Temp: 97.6 F (36.4 C) 98.2 F (36.8 C) 98 F (36.7 C) 97.6 F (36.4 C)  TempSrc: Oral Oral Oral Oral  SpO2: 94% 97% 98% 98%  Weight:    87 kg  Height:        Intake/Output Summary (Last 24 hours) at 05/01/2019 0741 Last data filed at 05/01/2019 0600 Gross per 24 hour  Intake 3253.54 ml  Output 2150 ml  Net 1103.54 ml   Last 3 Weights 05/01/2019 04/30/2019 04/29/2019  Weight (lbs) 191 lb 11.2 oz 187 lb 13.3 oz 185 lb 10 oz  Weight (kg) 86.955 kg 85.2 kg 84.2 kg      Telemetry    Not on telemetry - Personally Reviewed  ECG     04/28/19: sinus rhythm with prolonged PR - appears to be 280 ms by my review, HR 60 - Personally Reviewed  Physical Exam   GEN: No acute distress.   Neck: No JVD Cardiac: RRR, + 4/6 systolic murmur Respiratory: Clear to auscultation bilaterally. GI: Soft, nontender, non-distended  MS: trace edema; No deformity. Neuro:  Nonfocal  Psych: Normal affect   Labs    High Sensitivity Troponin:  No results for input(s): TROPONINIHS in the last 720 hours.    Cardiac EnzymesNo results for input(s): TROPONINI in the last 168 hours. No results for input(s): TROPIPOC in the last 168 hours.   Chemistry Recent Labs  Lab 04/28/19 1952 04/29/19 0525 04/30/19 0518  NA 143 140 135  K 4.3 4.1 3.7  CL 106 108 102  CO2 27 24 24   GLUCOSE 94 96 102*  BUN 22 20 17   CREATININE 1.46* 1.16 1.11  CALCIUM 8.9 8.2* 8.1*  PROT  --  5.5*  --   ALBUMIN  --  3.2*  --   AST  --  21  --   ALT  --  20  --   ALKPHOS  --  53  --   BILITOT  --  1.1  --   GFRNONAA 42* 56* 59*  GFRAA 49* >60 >60  ANIONGAP 10 8 9  Hematology Recent Labs  Lab 04/29/19 0525 04/30/19 0518 05/01/19 0507  WBC 7.3 6.8 7.1  RBC 3.58* 3.69* 3.79*  HGB 11.2* 11.6* 12.2*  HCT 36.6* 37.8* 38.1*  MCV 102.2* 102.4* 100.5*  MCH 31.3 31.4 32.2  MCHC 30.6 30.7 32.0  RDW 14.1 14.1 13.6  PLT 129* 121* 131*    BNPNo results for input(s): BNP, PROBNP in the last 168 hours.   DDimer No results for input(s): DDIMER in the last 168 hours.   Radiology    Dg Pelvis Portable  Result Date: 04/30/2019 CLINICAL DATA:  Status post right hip arthroplasty. EXAM: PORTABLE PELVIS 1-2 VIEWS COMPARISON:  Radiograph 04/28/2019 FINDINGS: Right hip hemiarthroplasty in good position without complicating features. The left hip is normally located. IMPRESSION: Right hip hemiarthroplasty in good position without complicating features. Electronically Signed   By: Marijo Sanes M.D.   On: 04/30/2019 17:37    Cardiac Studies   Echo  09/28/18: Study Conclusions - Left ventricle: The cavity size was normal. Wall thickness was   increased in a pattern of mild LVH. Systolic function was   moderately to severely reduced. The estimated ejection fraction   was in the range of 30% to 35%. Diffuse hypokinesis. The study is   not technically sufficient to allow evaluation of LV diastolic   function. LV filling pressure is elevated. - Aortic valve: Calcified leaflets. Moderate stenosis. Mean   gradient (S): 22 mm Hg. Valve area (VTI): 1.35 cm^2. Valve area   (Vmean): 1.5 cm^2. - Mitral valve: Mildly thickened leaflets . There was mild   regurgitation. - Left atrium: Moderately dilated. - Right ventricle: The cavity size was mildly dilated. - Right atrium: Severely dilated. - Tricuspid valve: There was mild regurgitation. - Pulmonary arteries: PA peak pressure: 46 mm Hg (S). - Inferior vena cava: The vessel was dilated. The respirophasic   diameter changes were blunted (< 50%), consistent with elevated   central venous pressure.  Impressions: - Compared to a prior study in 2017, the LVEF is lower at 30-35%.   There is now moderate aortic stenosis with a mean gradient of 22   mmHg.  Patient Profile     85 y.o. male with a hx of CAD s/p stents to LAD and circumflex in 6295, chronic systolic CHF and cardiomyopathy EF 30-35%, moderate aortic stenosis, PAF on Xarelto, [employer] permanent pacemaker for second-degree heart block, OSA no longer using CPAP, PVCs, hypertension, hyperlipidemia, probable CKD stage II-III, and hiatal hernia who was seen yesterday for pre-op clearance  Assessment & Plan    1. Right hip hemiarthroplasty - surgical repair yesterday without complications   2. Coronary artery disease s/p PCI (LAD, Cx stents 2014) - no ASA in the setting of xarelto - no anginal symptoms   3. Chronic systolic and diastolic heart failure - continue entresto and home dose of lasix 20 mg - he is overall net positive  following surgery - appears euvolemic on exam   4. Moderate aortic stenosis - this has been managed conservatively in the setting of lack of symptoms - question severity given low EF   5. Paroxysmal atrial fibrillation 6. PPM in place  - on reduced dose of xarelto 10 mg - PPM in place - continue amiodarone and lopressor - no telemetry  For questions or updates, please contact [place] Please consult [email or web address] for contact info under   Signed, Ledora Bottcher, PA  05/01/2019, 7:41 AM    The patient was seen, examined and discussed with  Duane BostonAngela N Duke , PA-C and I agree with the above.   The patient is post hip repair, he tolerated surgery well, no signs of angina or CHF, no arrhythmias on telemetry, BP slightly elevated, I would restart all outpatient meds, we will arrange for an outpatient follow up.  Tobias AlexanderKatarina Jakyria Bleau, MD 05/01/2019

## 2019-05-01 NOTE — Evaluation (Signed)
Physical Therapy Evaluation Patient Details Name: Gregory Mccormick MRN: [id number] DOB: December 08, 1928 Today's Date: 05/01/2019   History of Present Illness  s/p R hemiarthroplasty 2* fall.  PMH:  Pacemaker, PAF, CHF, CKD, Cardiomyopathy, and CAD  Clinical Impression  Pt presents with dependencies in mobility and independence secondary to the above diagnosis. Pt reports his wife and sons are available to assist as needed. Pt educated on posterior THP and safety with RW. Pt agreeable to BID treatment this afternoon in anticipation of D/C after therapy. Pt will continue to benefit from skilled PT to maximize mobility and independence for d/c home with PT services per surgeon's recommendations.    Follow Up Recommendations Follow surgeon's recommendation for DC plan and follow-up therapies    Equipment Recommendations  Rolling walker with 5" wheels    Recommendations for Other Services       Precautions / Restrictions Precautions Precautions: Posterior Hip Precaution Booklet Issued: Yes (comment) Precaution Comments: reviewed THP Restrictions RLE Weight Bearing: Weight bearing as tolerated      Mobility  Bed Mobility Overal bed mobility: Needs Assistance Bed Mobility: Sit to Supine     Supine to sit: Min assist Sit to supine: Min guard;HOB elevated   General bed mobility comments: cues for technique  Transfers Overall transfer level: Needs assistance Equipment used: Rolling walker (2 wheeled) Transfers: Sit to/from Stand Sit to Stand: Min guard Stand pivot transfers: Min guard       General transfer comment: cues for hand place,emt  Ambulation/Gait Ambulation/Gait assistance: Min guard Gait Distance (Feet): 120 Feet Assistive device: Rolling walker (2 wheeled) Gait Pattern/deviations: Step-through pattern;Decreased stride length;Decreased weight shift to right Gait velocity: decreased      Stairs            Wheelchair Mobility    Modified Rankin (Stroke  Patients Only)       Balance Overall balance assessment: No apparent balance deficits (not formally assessed)                                           Pertinent Vitals/Pain Pain Assessment: 0-10 Pain Score: 1  Pain Location: R hip Pain Descriptors / Indicators: Discomfort Pain Intervention(s): Limited activity within patient's tolerance;Monitored during session;Ice applied    Home Living Family/patient expects to be discharged to:: Private residence Living Arrangements: Spouse/significant other Available Help at Discharge: Family Type of Home: House Home Access: Stairs to enter Entrance Stairs-Rails: None Secretary/administrator of Steps: 1 Home Layout: One level Home Equipment: Bedside commode Additional Comments: 3 sons; one very close    Prior Function Level of Independence: Independent               Hand Dominance   Dominant Hand: Right    Extremity/Trunk Assessment   Upper Extremity Assessment Upper Extremity Assessment: Defer to OT evaluation    Lower Extremity Assessment Lower Extremity Assessment: RLE deficits/detail RLE Deficits / Details: stregght grossly 3-4/5, R knee flexion limited to 80* due to previous TKR       Communication   Communication: No difficulties  Cognition Arousal/Alertness: Awake/alert Behavior During Therapy: WFL for tasks assessed/performed Overall Cognitive Status: Within Functional Limits for tasks assessed  General Comments      Exercises Total Joint Exercises Ankle Circles/Pumps: AROM;Strengthening;Both;10 reps;Seated Quad Sets: AROM;Strengthening;Both;10 reps;Seated Heel Slides: Strengthening;AAROM;Right;10 reps;Seated Hip ABduction/ADduction: AROM;Strengthening;Right;10 reps;Seated Long Arc Quad: AROM;Strengthening;Right;10 reps;Seated   Assessment/Plan    PT Assessment Patient needs continued PT services  PT Problem List Decreased  strength;Decreased mobility;Decreased safety awareness;Decreased range of motion;Decreased activity tolerance       PT Treatment Interventions DME instruction;Therapeutic activities;Gait training;Therapeutic exercise;Patient/family education;Stair training;Balance training;Functional mobility training;Neuromuscular re-education    PT Goals (Current goals can be found in the Care Plan section)  Acute Rehab PT Goals Patient Stated Goal: to go home asap PT Goal Formulation: With patient Time For Goal Achievement: 05/08/19 Potential to Achieve Goals: Good    Frequency 7X/week   Barriers to discharge        Co-evaluation               AM-PAC PT "6 Clicks" Mobility  Outcome Measure Help needed turning from your back to your side while in a flat bed without using bedrails?: A Little Help needed moving from lying on your back to sitting on the side of a flat bed without using bedrails?: A Little Help needed moving to and from a bed to a chair (including a wheelchair)?: A Little Help needed standing up from a chair using your arms (e.g., wheelchair or bedside chair)?: A Little Help needed to walk in hospital room?: A Little Help needed climbing 3-5 steps with a railing? : A Little 6 Click Score: 18    End of Session Equipment Utilized During Treatment: Gait belt Activity Tolerance: Patient tolerated treatment well Patient left: in bed;with call bell/phone within reach Nurse Communication: Mobility status PT Visit Diagnosis: Difficulty in walking, not elsewhere classified (R26.2)    Time: 2500-3704 PT Time Calculation (min) (ACUTE ONLY): 45 min   Charges:   PT Evaluation $PT Eval Low Complexity: 1 Low PT Treatments $Gait Training: 8-22 mins $Therapeutic Exercise: 8-22 mins        Theodoro Grist, PT  Lelon Mast 05/01/2019, 10:34 AM

## 2019-05-01 NOTE — TOC Transition Note (Signed)
Transition of Care [facility]) - CM/SW Discharge Note   Patient Details  Name: Gregory Mccormick MRN: [id number] Date of Birth: Apr 12, 1929  [facility]) CM/SW Contact:  Dessa Phi, RN Phone Number: 05/01/2019, 3:56 PM   Clinical Narrative:  D/c home w/OPPT per orthopaedic surgeon's recc. PT-recc rw-[employer] rep Thedore Mins will deliver rw to rm prior d/c. Son will transport home in am. No further CM needs.           Patient Goals and CMS Choice        Discharge Placement                       Discharge Plan and Services                                     Social Determinants of Health (SDOH) Interventions     Readmission Risk Interventions No flowsheet data found.

## 2019-05-02 DIAGNOSIS — I4891 Unspecified atrial fibrillation: Secondary | ICD-10-CM

## 2019-05-02 LAB — CBC
HCT: 36.4 % — ABNORMAL LOW (ref 39.0–52.0)
Hemoglobin: 11.8 g/dL — ABNORMAL LOW (ref 13.0–17.0)
MCH: 32.1 pg (ref 26.0–34.0)
MCHC: 32.4 g/dL (ref 30.0–36.0)
MCV: 98.9 fL (ref 80.0–100.0)
Platelets: 157 10*3/uL (ref 150–400)
RBC: 3.68 MIL/uL — ABNORMAL LOW (ref 4.22–5.81)
RDW: 13.9 % (ref 11.5–15.5)
WBC: 10.5 10*3/uL (ref 4.0–10.5)
nRBC: 0 % (ref 0.0–0.2)

## 2019-05-02 LAB — BASIC METABOLIC PANEL
Anion gap: 8 (ref 5–15)
BUN: 25 mg/dL — ABNORMAL HIGH (ref 8–23)
CO2: 23 mmol/L (ref 22–32)
Calcium: 8 mg/dL — ABNORMAL LOW (ref 8.9–10.3)
Chloride: 105 mmol/L (ref 98–111)
Creatinine, Ser: 1.1 mg/dL (ref 0.61–1.24)
GFR calc Af Amer: >60 mL/min (ref >60)
GFR calc non Af Amer: 60 mL/min — ABNORMAL LOW (ref >60)
Glucose, Bld: 134 mg/dL — ABNORMAL HIGH (ref 70–99)
Potassium: 4.5 mmol/L (ref 3.5–5.1)
Sodium: 136 mmol/L (ref 135–145)

## 2019-05-02 MED ORDER — RIVAROXABAN 10 MG PO TABS: 10.0000 mg | ORAL_TABLET | Freq: Once | ORAL | Status: DC

## 2019-05-02 MED ORDER — POLYETHYLENE GLYCOL 3350 17 G PO PACK: 17.0000 g | PACK | Freq: Every day | ORAL | 0 refills | Status: DC | PRN

## 2019-05-02 MED ORDER — RIVAROXABAN 20 MG PO TABS: 20.0000 mg | ORAL_TABLET | Freq: Every day | ORAL | Status: DC

## 2019-05-02 MED ORDER — TAMSULOSIN HCL 0.4 MG PO CAPS: 0.4000 mg | ORAL_CAPSULE | Freq: Every day | ORAL | 4 refills | Status: DC

## 2019-05-02 MED ORDER — DOCUSATE SODIUM 100 MG PO CAPS: 100.0000 mg | ORAL_CAPSULE | Freq: Two times a day (BID) | ORAL | 0 refills | Status: DC

## 2019-05-02 NOTE — TOC Transition Note (Signed)
Transition of Care [facility]) - CM/SW Discharge Note   Patient Details  Name: Gregory Mccormick MRN: [id number] Date of Birth: 06/18/1929  Transition of Care [facility]) CM/SW Contact:  Dessa Phi, RN Phone Number: 05/02/2019, 10:43 AM   Clinical Narrative:Patient was ordered by attending for HHC-patient pleasantly declines HHC-he wants otpt PT as per orthopaedic surgeon's recc-they will set up otpt PT within 2 weeks. [employer] has delivered home rw to rm already. Son will transport home. No further CM needs.       Final next level of care: OP Rehab Barriers to Discharge: No Barriers Identified   Patient Goals and CMS Choice Patient states their goals for this hospitalization and ongoing recovery are:: go home CMS Medicare.gov Compare Post Acute Care list provided to:: Patient Choice offered to / list presented to : Patient  Discharge Placement                       Discharge Plan and Services                DME Arranged: Walker rolling DME Agency: [employer] Date DME Agency Contacted: 05/01/19 Time DME Agency Contacted: [phone] Representative spoke with at DME Agency: [place]: Patient Refused [place]          Social Determinants of Health ([place]) Interventions     Readmission Risk Interventions No flowsheet data found.

## 2019-05-02 NOTE — Plan of Care (Signed)
  Problem: Education: Goal: Knowledge of General Education information will improve Description: Including pain rating scale, medication(s)/side effects and non-pharmacologic comfort measures 05/02/2019 1037 by Timoteo Gaul, RN Outcome: Completed/Met 05/02/2019 1036 by Timoteo Gaul, RN Outcome: Progressing   Problem: Health Behavior/Discharge Planning: Goal: Ability to manage health-related needs will improve 05/02/2019 1037 by Timoteo Gaul, RN Outcome: Completed/Met 05/02/2019 1036 by Timoteo Gaul, RN Outcome: Progressing   Problem: Clinical Measurements: Goal: Ability to maintain clinical measurements within normal limits will improve 05/02/2019 1037 by Timoteo Gaul, RN Outcome: Completed/Met 05/02/2019 1036 by Timoteo Gaul, RN Outcome: Progressing Goal: Will remain free from infection 05/02/2019 1037 by Timoteo Gaul, RN Outcome: Completed/Met 05/02/2019 1036 by Timoteo Gaul, RN Outcome: Progressing Goal: Diagnostic test results will improve 05/02/2019 1037 by Timoteo Gaul, RN Outcome: Completed/Met 05/02/2019 1036 by Timoteo Gaul, RN Outcome: Progressing Goal: Respiratory complications will improve 05/02/2019 1037 by Timoteo Gaul, RN Outcome: Completed/Met 05/02/2019 1036 by Timoteo Gaul, RN Outcome: Progressing Goal: Cardiovascular complication will be avoided 05/02/2019 1037 by Timoteo Gaul, RN Outcome: Completed/Met 05/02/2019 1036 by Timoteo Gaul, RN Outcome: Progressing   Problem: Activity: Goal: Risk for activity intolerance will decrease 05/02/2019 1037 by Timoteo Gaul, RN Outcome: Completed/Met 05/02/2019 1036 by Timoteo Gaul, RN Outcome: Progressing   Problem: Nutrition: Goal: Adequate nutrition will be maintained 05/02/2019 1037 by Timoteo Gaul, RN Outcome: Completed/Met 05/02/2019 1036 by Timoteo Gaul, RN Outcome: Progressing   Problem: Elimination: Goal: Will not experience complications related to bowel motility 05/02/2019 1037  by Timoteo Gaul, RN Outcome: Completed/Met 05/02/2019 1036 by Timoteo Gaul, RN Outcome: Progressing Goal: Will not experience complications related to urinary retention 05/02/2019 1037 by Timoteo Gaul, RN Outcome: Completed/Met 05/02/2019 1036 by Timoteo Gaul, RN Outcome: Progressing   Problem: Pain Managment: Goal: General experience of comfort will improve 05/02/2019 1037 by Timoteo Gaul, RN Outcome: Completed/Met 05/02/2019 1036 by Timoteo Gaul, RN Outcome: Progressing   Problem: Safety: Goal: Ability to remain free from injury will improve 05/02/2019 1037 by Timoteo Gaul, RN Outcome: Completed/Met 05/02/2019 1036 by Timoteo Gaul, RN Outcome: Progressing   Problem: Skin Integrity: Goal: Risk for impaired skin integrity will decrease 05/02/2019 1037 by Timoteo Gaul, RN Outcome: Completed/Met 05/02/2019 1036 by Timoteo Gaul, RN Outcome: Progressing   Problem: Acute Rehab PT Goals(only PT should resolve) Goal: Pt Will Go Supine/Side To Sit Outcome: Completed/Met Goal: Patient Will Transfer Sit To/From Stand Outcome: Completed/Met Goal: Pt Will Transfer Bed To Chair/Chair To Bed Outcome: Completed/Met Goal: Pt Will Ambulate Outcome: Completed/Met Goal: Pt Will Go Up/Down Stairs Outcome: Completed/Met Goal: Pt Will Verbalize and Adhere to Precautions While Description: PT Will Verbalize and Adhere to Precautions While Performing Mobility Outcome: Completed/Met Goal: Pt/caregiver will Perform Home Exercise Program Outcome: Completed/Met

## 2019-05-02 NOTE — Progress Notes (Signed)
Patient ID: Gregory Mccormick, male   DOB: November 09, 1928, 85 y.o.   MRN: [id number] Subjective: 2 Days Post-Op Procedure(s) (LRB): ARTHROPLASTY BIPOLAR HIP (HEMIARTHROPLASTY) (Right)    Patient reports pain as mild.  Doing well.  Wonderful disposition, ready to get home.  No events.  Objective:   VITALS:   Vitals:   05/01/19 2038 05/02/19 0450  BP: 120/68 128/70  Pulse: 61 60  Resp: 20 17  Temp: 97.9 F (36.6 C) 98.1 F (36.7 C)  SpO2: 94% 98%    Neurovascular intact Incision: dressing C/D/I - right hip Stiff right knee after TKR  LABS Recent Labs    04/30/19 0518 05/01/19 0507 05/02/19 0545  HGB 11.6* 12.2* 11.8*  HCT 37.8* 38.1* 36.4*  WBC 6.8 7.1 10.5  PLT 121* 131* 157    Recent Labs    04/30/19 0518 05/02/19 0545  NA 135 136  K 3.7 4.5  BUN 17 25*  CREATININE 1.11 1.10  GLUCOSE 102* 134*    No results for input(s): LABPT, INR in the last 72 hours.   Assessment/Plan: 2 Days Post-Op Procedure(s) (LRB): ARTHROPLASTY BIPOLAR HIP (HEMIARTHROPLASTY) (Right)   Advance diet Up with therapy  Home today RTC in 2 weeks Call with questions

## 2019-05-02 NOTE — Progress Notes (Signed)
Pt was on Xarelto 20mg  qday PTA for PAF. D/w Griffith Citron of ortho and ok to increase current dose to home dose.   Onnie Boer, PharmD, BCIDP, AAHIVP, CPP Infectious Disease Pharmacist 05/02/2019 10:56 AM

## 2019-05-02 NOTE — Plan of Care (Signed)

## 2019-05-02 NOTE — Discharge Summary (Addendum)
Physician Discharge Summary   Patient ID: Audree CamelJack D Tolles MRN: [id number] DOB/AGE: November 23, 1928 85 y.o.  Admit date: 04/28/2019 Discharge date: 05/02/2019  Primary Care Physician:  Tally JoeSwayne, David, MD   Recommendations for Outpatient Follow-up:  1. Follow up with orthopedics in 2 weeks  Home Health: Outpatient PT OT Equipment/Devices: DME rolling walker  Discharge Condition: stable  CODE STATUS: FULL Diet recommendation: Heart healthy diet   Discharge Diagnoses:    . Fracture of femoral neck, right, closed (HCC) status post right hip hemiarthroplasty . Essential hypertension, benign . Paroxysmal atrial fibrillation (HCC) . Hyperlipidemia . BPH (benign prostatic hyperplasia) . Chronic systolic and diastolic CHF (congestive heart failure) (HCC) . Hypothyroidism   Consults: Orthopedics    Allergies:   Allergies  Allergen Reactions  . Penicillins Hives    Has tolerated Keflex      Has patient had a PCN reaction causing immediate rash, facial/tongue/throat swelling, SOB or lightheadedness with hypotension: YES Has patient had a PCN reaction causing severe rash involving mucus membranes or skin necrosis: No Has patient had a PCN reaction that required hospitalization No Has patient had a PCN reaction occurring within the last 10 years: No If all of the above answers are "NO", then may proceed with Cephalosporin use.   Marland Kitchen. Hydromorphone Hcl Nausea And Vomiting     DISCHARGE MEDICATIONS: Allergies as of 05/02/2019      Reactions   Penicillins Hives   Has tolerated Keflex      Has patient had a PCN reaction causing immediate rash, facial/tongue/throat swelling, SOB or lightheadedness with hypotension: YES Has patient had a PCN reaction causing severe rash involving mucus membranes or skin necrosis: No Has patient had a PCN reaction that required hospitalization No Has patient had a PCN reaction occurring within the last 10 years: No If all of the above answers are "NO", then  may proceed with Cephalosporin use.   Hydromorphone Hcl Nausea And Vomiting      Medication List    STOP taking these medications   acetaminophen 500 MG tablet Commonly known as: TYLENOL     TAKE these medications   amiodarone 200 MG tablet Commonly known as: PACERONE Take 1 tablet (200 mg total) by mouth daily.   atorvastatin 10 MG tablet Commonly known as: LIPITOR TAKE 1 TABLET BY MOUTH DAILY AT 6 PM.   docusate sodium 100 MG capsule Commonly known as: COLACE Take 1 capsule (100 mg total) by mouth 2 (two) times daily.   ferrous sulfate 325 (65 FE) MG tablet Take 1 tablet (325 mg total) by mouth 3 (three) times daily after meals for 14 days.   Fish Oil 1000 MG Caps Take 1,000 mg by mouth daily.   furosemide 20 MG tablet Commonly known as: LASIX Take 1 tablet (20 mg total) by mouth daily.   HYDROcodone-acetaminophen 7.5-325 MG tablet Commonly known as: NORCO Take 1-2 tablets by mouth every 4 (four) hours as needed for severe pain (pain score 7-10).   methocarbamol 500 MG tablet Commonly known as: ROBAXIN Take 1 tablet (500 mg total) by mouth every 6 (six) hours as needed for muscle spasms.   Metoprolol Succinate 25 MG Cs24 Take 25 mg by mouth daily.   nitroGLYCERIN 0.4 MG SL tablet Commonly known as: NITROSTAT Place 1 tablet (0.4 mg total) under the tongue every 5 (five) minutes as needed for chest pain.   polyethylene glycol 17 g packet Commonly known as: MIRALAX / GLYCOLAX Take 17 g by mouth daily as needed for  moderate constipation.   PRESERVISION AREDS 2 PO Take 1 capsule by mouth 2 (two) times daily.   sacubitril-valsartan 24-26 MG Commonly known as: Entresto Take 1 tablet by mouth 2 (two) times daily.   tamsulosin 0.4 MG Caps capsule Commonly known as: FLOMAX Take 1 capsule (0.4 mg total) by mouth daily. Start taking on: May 03, 2019   Xarelto 20 MG Tabs tablet Generic drug: rivaroxaban TAKE 1 TABLET (20 MG TOTAL) BY MOUTH DAILY WITH SUPPER.             Durable Medical Equipment  (From admission, onward)         Start     Ordered   05/01/19 1557  For home use only DME Walker rolling  Once    Question:  Patient needs a walker to treat with the following condition  Answer:  Unsteady gait   05/01/19 1556           Brief H and P: For complete details please refer to admission H and P, but in brief Patient is 85 year old male with hypertension, hyperlipidemia, history of Mobitz type II status post pacemaker, chronic diastolic CHF, paroxysmal A. fib had presented with mechanical fall and right hip pain.  X-ray of the right hip showed impacted subcapital fracture of the right femoral neck. Orthopedics was consulted, patient underwent surgery on 6/30   Hospital Course:   Fracture of femoral neck, right, closed ([facility]) -Status post mechanical fall -X-ray of the right hip showed impacted subcapital fracture of the right femoral neck -Orthopedics was consulted, patient underwent right hip hemiarthroplasty on 6/30, postop day # 2 -Pain currently controlled, continue Xarelto -PT reevaluation was done, patient preferred outpatient physical therapy , DME rolling walker -Patient was cleared by orthopedics for discharge home     Essential hypertension, benign -BP currently stable  Chronic systolic and diastolic CHF -Continue Entresto -Continue Lasix 20 mg daily, euvolemic  Coronary artery disease status post PCI, LAD, Cx stents in 2014, paroxysmal atrial fibrillation -Currently rate controlled, no anginal symptoms -Cardiology following, no aspirin in the setting of Xarelto -Continue Xarelto, amiodarone and Lopressor  Moderate aortic stenosis -He has been managed conservatively in the setting of lack of symptoms. - cardiology following -History of pacemaker, on reduced dose of Xarelto 10 mg    BPH (benign prostatic hyperplasia) -Continue Flomax    OSA on CPAP -Continue CPAP at night  Hyperlipidemia -Continue  statin  Paroxysmal atrial fibrillation -Rate controlled, continue Xarelto   Day of Discharge S: Doing well, feels ready to go home, did very well with PT.  BP 128/70   Pulse 60   Temp 98.1 F (36.7 C)   Resp 17   Ht 5\' 8"  (1.727 m)   Wt 91.4 kg   SpO2 98%   BMI 30.64 kg/m   Physical Exam: General: Alert and awake oriented x3 not in any acute distress. HEENT: anicteric sclera, pupils reactive to light and accommodation CVS: S1-S2 clear no murmur rubs or gallops Chest: clear to auscultation bilaterally, no wheezing rales or rhonchi Abdomen: soft nontender, nondistended, normal bowel sounds Extremities: no cyanosis, clubbing or edema noted bilaterally Neuro: Cranial nerves II-XII intact, no focal neurological deficits   The results of significant diagnostics from this hospitalization (including imaging, microbiology, ancillary and laboratory) are listed below for reference.      Procedures/Studies:  Ct Head Wo Contrast  Result Date: 04/28/2019 CLINICAL DATA:  85 y/o M; fall with head trauma, minor, pt on anticoagulation. EXAM: CT HEAD  WITHOUT CONTRAST TECHNIQUE: Contiguous axial images were obtained from the base of the skull through the vertex without intravenous contrast. COMPARISON:  11/27/2012 CT head. FINDINGS: Brain: No evidence of acute infarction, hemorrhage, hydrocephalus, extra-axial collection or mass lesion/mass effect. Stable nonspecific white matter hypodensities compatible with chronic microvascular ischemic changes and stable volume loss of the brain. Vascular: Calcific atherosclerosis of the internal carotid arteries and the vertebral arteries. No hyperdense vessel. Skull: Normal. Negative for fracture or focal lesion. Sinuses/Orbits: Included paranasal sinuses and left mastoid air cells are normally aerated. Right wall down mastoidectomy chronic postsurgical changes with stable fat attenuating soft tissue posterior mastoidectomy bowl. Other: Bilateral  intra-ocular lens replacement. IMPRESSION: 1. No acute intracranial abnormality identified. 2. Stable chronic microvascular ischemic changes and parenchymal volume loss of the brain. Electronically Signed   By: Mitzi Hansen M.D.   On: 04/28/2019 20:17   Dg Pelvis Portable  Result Date: 04/30/2019 CLINICAL DATA:  Status post right hip arthroplasty. EXAM: PORTABLE PELVIS 1-2 VIEWS COMPARISON:  Radiograph 04/28/2019 FINDINGS: Right hip hemiarthroplasty in good position without complicating features. The left hip is normally located. IMPRESSION: Right hip hemiarthroplasty in good position without complicating features. Electronically Signed   By: Rudie Meyer M.D.   On: 04/30/2019 17:37   Dg Chest Portable 1 View  Result Date: 04/28/2019 CLINICAL DATA:  Preoperative hip fracture EXAM: PORTABLE CHEST 1 VIEW COMPARISON:  10/06/2010 FINDINGS: Cardiomegaly with left chest multi lead pacer. Hiatal hernia. Both lungs are clear. The visualized skeletal structures are unremarkable. IMPRESSION: Cardiomegaly without acute abnormality of the lungs.  Hiatal hernia. Electronically Signed   By: Lauralyn Primes M.D.   On: 04/28/2019 19:12   Dg Hip Unilat  With Pelvis 2-3 Views Right  Result Date: 04/28/2019 CLINICAL DATA:  Fall, pain EXAM: DG HIP (WITH OR WITHOUT PELVIS) 2-3V RIGHT COMPARISON:  None. FINDINGS: There is an impacted subcapital fracture of the right femoral neck. No other radiographic evidence of fracture of the pelvis or proximal left femur. Osteopenia. IMPRESSION: There is an impacted subcapital fracture of the right femoral neck. No other radiographic evidence of fracture of the pelvis or proximal left femur. Osteopenia. Electronically Signed   By: Lauralyn Primes M.D.   On: 04/28/2019 18:34      LAB RESULTS: Basic Metabolic Panel: Recent Labs  Lab 04/30/19 0518 05/02/19 0545  NA 135 136  K 3.7 4.5  CL 102 105  CO2 24 23  GLUCOSE 102* 134*  BUN 17 25*  CREATININE 1.11 1.10  CALCIUM  8.1* 8.0*   Liver Function Tests: Recent Labs  Lab 04/29/19 0525  AST 21  ALT 20  ALKPHOS 53  BILITOT 1.1  PROT 5.5*  ALBUMIN 3.2*   No results for input(s): LIPASE, AMYLASE in the last 168 hours. No results for input(s): AMMONIA in the last 168 hours. CBC: Recent Labs  Lab 04/28/19 1952  05/01/19 0507 05/02/19 0545  WBC 9.8   < > 7.1 10.5  NEUTROABS 8.7*  --   --   --   HGB 12.4*   < > 12.2* 11.8*  HCT 39.9   < > 38.1* 36.4*  MCV 101.8*   < > 100.5* 98.9  PLT 162   < > 131* 157   < > = values in this interval not displayed.   Cardiac Enzymes: No results for input(s): CKTOTAL, CKMB, CKMBINDEX, TROPONINI in the last 168 hours. BNP: Invalid input(s): POCBNP CBG: No results for input(s): GLUCAP in the last 168 hours.  Disposition and Follow-up: Discharge Instructions    Diet - low sodium heart healthy   Complete by: As directed    Increase activity slowly   Complete by: As directed        DISPOSITION: Home with outpatient PT   DISCHARGE FOLLOW-UP Follow-up Information    Durene Romanslin, Matthew, MD. Schedule an appointment as soon as possible for a visit in 2 weeks.   Specialty: Orthopedic Surgery Contact information: [address] [place] [address] [phone]        Dyann KiefLenze, Michele M, PA-C Follow up on 05/15/2019.   Specialty: Cardiology Why: 12:30 for hospital follow up Contact information: [address] [place] [address] [phone]        [facility] Follow up.   Why: home rolling walker Contact information: [address] [place] [address] [phone]            Time coordinating discharge:  35 minutes  Signed:   Thad Rangeripudeep Rai M.D. [facility] 05/02/2019, 12:25 PM

## 2019-05-02 NOTE — Discharge Instructions (Signed)
INSTRUCTIONS AFTER JOINT REPLACEMENT  ° °o Remove items at home which could result in a fall. This includes throw rugs or furniture in walking pathways °o ICE to the affected joint every three hours while awake for 30 minutes at a time, for at least the first 3-5 days, and then as needed for pain and swelling.  Continue to use ice for pain and swelling. You may notice swelling that will progress down to the foot and ankle.  This is normal after surgery.  Elevate your leg when you are not up walking on it.   °o Continue to use the breathing machine you got in the hospital (incentive spirometer) which will help keep your temperature down.  It is common for your temperature to cycle up and down following surgery, especially at night when you are not up moving around and exerting yourself.  The breathing machine keeps your lungs expanded and your temperature down. ° ° °DIET:  As you were doing prior to hospitalization, we recommend a well-balanced diet. ° °DRESSING / WOUND CARE / SHOWERING ° °Keep the surgical dressing until follow up.  The dressing is water proof, so you can shower without any extra covering.  IF THE DRESSING FALLS OFF or the wound gets wet inside, change the dressing with sterile gauze.  Please use good hand washing techniques before changing the dressing.  Do not use any lotions or creams on the incision until instructed by your surgeon.   ° °ACTIVITY ° °o Increase activity slowly as tolerated, but follow the weight bearing instructions below.   °o No driving for 6 weeks or until further direction given by your physician.  You cannot drive while taking narcotics.  °o No lifting or carrying greater than 10 lbs. until further directed by your surgeon. °o Avoid periods of inactivity such as sitting longer than an hour when not asleep. This helps prevent blood clots.  °o You may return to work once you are authorized by your doctor.  ° ° ° °WEIGHT BEARING  ° °Weight bearing as tolerated with assist  device (walker, cane, etc) as directed, use it as long as suggested by your surgeon or therapist, typically at least 4-6 weeks. ° ° °EXERCISES ° °Results after joint replacement surgery are often greatly improved when you follow the exercise, range of motion and muscle strengthening exercises prescribed by your doctor. Safety measures are also important to protect the joint from further injury. Any time any of these exercises cause you to have increased pain or swelling, decrease what you are doing until you are comfortable again and then slowly increase them. If you have problems or questions, call your caregiver or physical therapist for advice.  ° °Rehabilitation is important following a joint replacement. After just a few days of immobilization, the muscles of the leg can become weakened and shrink (atrophy).  These exercises are designed to build up the tone and strength of the thigh and leg muscles and to improve motion. Often times heat used for twenty to thirty minutes before working out will loosen up your tissues and help with improving the range of motion but do not use heat for the first two weeks following surgery (sometimes heat can increase post-operative swelling).  ° °These exercises can be done on a training (exercise) mat, on the floor, on a table or on a bed. Use whatever works the best and is most comfortable for you.    Use music or television while you are exercising so that   the exercises are a pleasant break in your day. This will make your life better with the exercises acting as a break in your routine that you can look forward to.   Perform all exercises about fifteen times, three times per day or as directed.  You should exercise both the operative leg and the other leg as well. ° °Exercises include: °  °• Quad Sets - Tighten up the muscle on the front of the thigh (Quad) and hold for 5-10 seconds.   °• Straight Leg Raises - With your knee straight (if you were given a brace, keep it on),  lift the leg to 60 degrees, hold for 3 seconds, and slowly lower the leg.  Perform this exercise against resistance later as your leg gets stronger.  °• Leg Slides: Lying on your back, slowly slide your foot toward your buttocks, bending your knee up off the floor (only go as far as is comfortable). Then slowly slide your foot back down until your leg is flat on the floor again.  °• Angel Wings: Lying on your back spread your legs to the side as far apart as you can without causing discomfort.  °• Hamstring Strength:  Lying on your back, push your heel against the floor with your leg straight by tightening up the muscles of your buttocks.  Repeat, but this time bend your knee to a comfortable angle, and push your heel against the floor.  You may put a pillow under the heel to make it more comfortable if necessary.  ° °A rehabilitation program following joint replacement surgery can speed recovery and prevent re-injury in the future due to weakened muscles. Contact your doctor or a physical therapist for more information on knee rehabilitation.  ° ° °CONSTIPATION ° °Constipation is defined medically as fewer than three stools per week and severe constipation as less than one stool per week.  Even if you have a regular bowel pattern at home, your normal regimen is likely to be disrupted due to multiple reasons following surgery.  Combination of anesthesia, postoperative narcotics, change in appetite and fluid intake all can affect your bowels.  ° °YOU MUST use at least one of the following options; they are listed in order of increasing strength to get the job done.  They are all available over the counter, and you may need to use some, POSSIBLY even all of these options:   ° °Drink plenty of fluids (prune juice may be helpful) and high fiber foods °Colace 100 mg by mouth twice a day  °Senokot for constipation as directed and as needed Dulcolax (bisacodyl), take with full glass of water  °Miralax (polyethylene glycol)  once or twice a day as needed. ° °If you have tried all these things and are unable to have a bowel movement in the first 3-4 days after surgery call either your surgeon or your primary doctor.   ° °If you experience loose stools or diarrhea, hold the medications until you stool forms back up.  If your symptoms do not get better within 1 week or if they get worse, check with your doctor.  If you experience "the worst abdominal pain ever" or develop nausea or vomiting, please contact the office immediately for further recommendations for treatment. ° ° °ITCHING:  If you experience itching with your medications, try taking only a single pain pill, or even half a pain pill at a time.  You can also use Benadryl over the counter for itching or also to   help with sleep.   TED HOSE STOCKINGS:  Use stockings on both legs until for at least 2 weeks or as directed by physician office. They may be removed at night for sleeping.  MEDICATIONS:  See your medication summary on the After Visit Summary that nursing will review with you.  You may have some home medications which will be placed on hold until you complete the course of blood thinner medication.  It is important for you to complete the blood thinner medication as prescribed.  PRECAUTIONS:  If you experience chest pain or shortness of breath - call 911 immediately for transfer to the hospital emergency department.   If you develop a fever greater that 101 F, purulent drainage from wound, increased redness or drainage from wound, foul odor from the wound/dressing, or calf pain - CONTACT YOUR SURGEON.                                                   FOLLOW-UP APPOINTMENTS:  If you do not already have a post-op appointment, please call the office for an appointment to be seen by your surgeon.  Guidelines for how soon to be seen are listed in your After Visit Summary, but are typically between 1-4 weeks after surgery.  OTHER INSTRUCTIONS:   MAKE SURE YOU:    Understand these instructions.   Get help right away if you are not doing well or get worse.    Thank you for letting us be a part of your medical care team.  It is a privilege we respect greatly.  We hope these instructions will help you stay on track for a fast and full recovery!   Information on my medicine - XARELTO (Rivaroxaban)  This medication education was reviewed with me or my healthcare representative as part of my discharge preparation.  The pharmacist that spoke with me during my hospital stay was:  Ulyses Southward, RPH-CPP  Why was Xarelto prescribed for you? Xarelto was prescribed for you to reduce the risk of a blood clot forming that can cause a stroke if you have a medical condition called atrial fibrillation (a type of irregular heartbeat).  What do you need to know about xarelto ? Take your Xarelto ONCE DAILY at the same time every day with your evening meal. If you have difficulty swallowing the tablet whole, you may crush it and mix in applesauce just prior to taking your dose.  Take Xarelto exactly as prescribed by your doctor and DO NOT stop taking Xarelto without talking to the doctor who prescribed the medication.  Stopping without other stroke prevention medication to take the place of Xarelto may increase your risk of developing a clot that causes a stroke.  Refill your prescription before you run out.  After discharge, you should have regular check-up appointments with your healthcare provider that is prescribing your Xarelto.  In the future your dose may need to be changed if your kidney function or weight changes by a significant amount.  What do you do if you miss a dose? If you are taking Xarelto ONCE DAILY and you miss a dose, take it as soon as you remember on the same day then continue your regularly scheduled once daily regimen the next day. Do not take two doses of Xarelto at the same time or on the same day.  Important Safety Information A  possible side effect of Xarelto is bleeding. You should call your healthcare provider right away if you experience any of the following: ? Bleeding from an injury or your nose that does not stop. ? Unusual colored urine (red or dark brown) or unusual colored stools (red or black). ? Unusual bruising for unknown reasons. ? A serious fall or if you hit your head (even if there is no bleeding).  Some medicines may interact with Xarelto and might increase your risk of bleeding while on Xarelto. To help avoid this, consult your healthcare provider or pharmacist prior to using any new prescription or non-prescription medications, including herbals, vitamins, non-steroidal anti-inflammatory drugs (NSAIDs) and supplements.  This website has more information on Xarelto: [email or web address]

## 2019-05-09 ENCOUNTER — Ambulatory Visit: Payer: Medicare Other | Admitting: Cardiology

## 2019-05-14 ENCOUNTER — Telehealth: Payer: Self-pay | Admitting: Physician Assistant

## 2019-05-14 NOTE — Telephone Encounter (Signed)
New Message         COVID-19 Pre-Screening Questions:   In the past 7 to 10 days have you had a cough,  shortness of breath, headache, congestion, fever (100 or greater) body aches, chills, sore throat, or sudden loss of taste or sense of smell? NO  Have you been around anyone with known Covid 19. NO  Have you been around anyone who is awaiting Covid 19 test results in the past 7 to 10 days? NO  Have you been around anyone who has been exposed to Covid 19, or has mentioned symptoms of Covid 19 within the past 7 to 10 days? NO Pt is walking with a walker and will need assistance, he said either his son or daughter in law will be bringing him  If you have any concerns/questions about symptoms patients report during screening (either on the phone or at threshold). Contact the provider seeing the patient or DOD for further guidance.  If neither are available contact a member of the leadership team.

## 2019-05-15 ENCOUNTER — Ambulatory Visit (INDEPENDENT_AMBULATORY_CARE_PROVIDER_SITE_OTHER): Payer: Medicare Other | Admitting: Physician Assistant

## 2019-05-15 ENCOUNTER — Encounter: Payer: Self-pay | Admitting: Physician Assistant

## 2019-05-15 ENCOUNTER — Other Ambulatory Visit: Payer: Self-pay

## 2019-05-15 VITALS — BP 108/52 | HR 67 | Ht 68.0 in | Wt 190.8 lb

## 2019-05-15 DIAGNOSIS — I5043 Acute on chronic combined systolic (congestive) and diastolic (congestive) heart failure: Secondary | ICD-10-CM | POA: Diagnosis not present

## 2019-05-15 DIAGNOSIS — I4891 Unspecified atrial fibrillation: Secondary | ICD-10-CM

## 2019-05-15 DIAGNOSIS — R609 Edema, unspecified: Secondary | ICD-10-CM | POA: Insufficient documentation

## 2019-05-15 DIAGNOSIS — I350 Nonrheumatic aortic (valve) stenosis: Secondary | ICD-10-CM

## 2019-05-15 DIAGNOSIS — I2510 Atherosclerotic heart disease of native coronary artery without angina pectoris: Secondary | ICD-10-CM | POA: Insufficient documentation

## 2019-05-15 DIAGNOSIS — I10 Essential (primary) hypertension: Secondary | ICD-10-CM

## 2019-05-15 MED ORDER — FUROSEMIDE 20 MG PO TABS: ORAL_TABLET | ORAL | 3 refills | Status: DC

## 2019-05-15 NOTE — Progress Notes (Signed)
Cardiology Office Note    Date:  05/15/2019   ID:  Gregory Mccormick, DOB 01/11/1929, MRN [id number]  PCP:  Antony Contras, MD  Cardiologist: Larae Grooms, MD EPS: Thompson Grayer, MD  No chief complaint on file.   History of Present Illness:  Gregory Mccormick is a 85 y.o. male CAD s/p stents to LAD and circumflex in 6237, chronic systolic CHF and cardiomyopathy EF 30-35%, moderate aortic stenosis, PAF on Xarelto, [employer]tronic permanent pacemaker for second-degree heart block, OSA no longer using CPAP, PVCs, hypertension, hyperlipidemia, probable CKD stage II-III, and hiatal hernia .   Was hospitalized and underwent right hip Hemiarthroplasty 04/27/3150 without complications.  Patient had no trouble with heart failure or chest pain while in the hospital.  Patient comes in accompanied by his son. He has had increased leg edema worse on right leg and seeing orthopedist today.  May have gotten some extra salt in his diet from food brought in as his wife is hospitalized now and he is living by himself.     Past Medical History:  Diagnosis Date  . Anemia, unspecified   . Aortic stenosis   . Arthritis    "right knee" (03/28/2013  . CAD in native artery    takes Xarelto daily  . Cardiomyopathy ([place])    EF 40%:  ECHO 11/25/2015 EF 55%-60%  . Carpal tunnel syndrome, bilateral   . Chronic systolic CHF (congestive heart failure) ([place])   . CKD (chronic kidney disease), stage II   . Epistaxis   . Essential hypertension, benign    takes Metoprolol daily  . H/O hiatal hernia   . History of kidney stones   . Hyperlipidemia    takes Lipitor daily  . Nonspecific abnormal unspecified cardiovascular function study   . OSA on CPAP    wears CPAP 02/03/16  . Pacemaker    MDT  . PAF (paroxysmal atrial fibrillation) ([place])   . Paroxysmal ventricular tachycardia ([place])   . PONV (postoperative nausea and vomiting)   . PVC's (premature ventricular contractions)   . Vitamin D deficiency     Past  Surgical History:  Procedure Laterality Date  . CARDIAC CATHETERIZATION  03/21/2013  . CARDIOVERSION N/A 10/09/2018   Procedure: CARDIOVERSION;  Surgeon: Josue Hector, MD;  Location: [facility] ENDOSCOPY;  Service: Cardiovascular;  Laterality: N/A;  . CARPAL TUNNEL RELEASE Right 02/05/2016   Procedure: RIGHT LIMITED OPEN CARPAL TUNNEL RELEASE;  Surgeon: Roseanne Kaufman, MD;  Location: [place];  Service: Orthopedics;  Laterality: Right;  . CARPAL TUNNEL RELEASE Left 04/21/2016   Procedure: CARPAL TUNNEL RELEASE;  Surgeon: Roseanne Kaufman, MD;  Location: [place];  Service: Orthopedics;  Laterality: Left;  . CATARACT EXTRACTION W/ INTRAOCULAR LENS  IMPLANT, BILATERAL Bilateral 2000's  . COLONOSCOPY    . CORONARY ANGIOPLASTY WITH STENT PLACEMENT  03/28/2013   "3 stents" (03/28/2013)  . HIP ARTHROPLASTY Right 04/30/2019   Procedure: ARTHROPLASTY BIPOLAR HIP (HEMIARTHROPLASTY);  Surgeon: Paralee Cancel, MD;  Location: [facility] ORS;  Service: Orthopedics;  Laterality: Right;  . INSERT / REPLACE / [place] Left 1972   "cut it open" (03/28/2013)  . KNEE CARTILAGE SURGERY Right ~ 1977   "cut it open" (03/28/2013)  . 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 PERCUTANEOUS CORONARY ROTOBLATOR INTERVENTION (PCI-R) N/A 03/28/2013   Procedure: PERCUTANEOUS CORONARY ROTOBLATOR INTERVENTION (PCI-R);  Surgeon: Jettie Booze, MD;  Location: [place] CATH LAB;  Service: Cardiovascular;  Laterality: N/A;  . SEPTOPLASTY N/A 02/05/2016   Procedure: SEPTOPLASTY WITH INTRANASAL SKIN GRAFT;  Surgeon: Rozetta Nunnery, MD;  Location: [place];  Service: ENT;  Laterality: N/A;  . SHOULDER HEMI-ARTHROPLASTY Right 1987   "got hit by sled & dragged down sheet > 200 feet; tore shoulder up" (03/28/2013)  . SHOULDER SURGERY Right 1970's   "pulled muscle apart; sewed it back together" (03/28/2013  . TOTAL KNEE ARTHROPLASTY Right 2007  . TOTAL KNEE  ARTHROPLASTY  11/26/2012   Procedure: TOTAL KNEE ARTHROPLASTY;  Surgeon: Gearlean Alf, MD;  Location: [facility];  Service: Orthopedics;  Laterality: Left;  . TRANSURETHRAL RESECTION OF PROSTATE N/A 12/27/2017   Procedure: TRANSURETHRAL RESECTION OF THE PROSTATE (TURP);  Surgeon: Lucas Mallow, MD;  [facility];  Service: Urology;  Laterality: N/A;    Current Medications: Current Meds  Medication Sig  . amiodarone (PACERONE) 200 MG tablet Take 1 tablet (200 mg total) by mouth daily.  Marland Kitchen atorvastatin (LIPITOR) 10 MG tablet TAKE 1 TABLET BY MOUTH DAILY AT 6 PM.  . clindamycin (CLEOCIN) 150 MG capsule clindamycin HCl 150 mg capsule  . docusate sodium (COLACE) 100 MG capsule Take 1 capsule (100 mg total) by mouth 2 (two) times daily.  . ferrous sulfate 325 (65 FE) MG tablet Take 1 tablet (325 mg total) by mouth 3 (three) times daily after meals for 14 days.  . furosemide (LASIX) 20 MG tablet Take 2 tablets (40 mg) daily for 4 days and then take 1 tablet (20 mg) once a day  . HYDROcodone-acetaminophen (NORCO) 7.5-325 MG tablet Take 1-2 tablets by mouth every 4 (four) hours as needed for severe pain (pain score 7-10).  . isosorbide mononitrate (IMDUR) 30 MG 24 hr tablet isosorbide mononitrate ER 30 mg tablet,extended release 24 hr  TAKE 1 TABLET BY MOUTH EVERY DAY  . methocarbamol (ROBAXIN) 500 MG tablet Take 1 tablet (500 mg total) by mouth every 6 (six) hours as needed for muscle spasms.  . Metoprolol Succinate 25 MG CS24 Take 25 mg by mouth daily.  . Multiple Vitamins-Minerals (PRESERVISION AREDS 2 PO) Take 1 capsule by mouth 2 (two) times daily.   . nitroGLYCERIN (NITROSTAT) 0.4 MG SL tablet Place 1 tablet (0.4 mg total) under the tongue every 5 (five) minutes as needed for chest pain.  . Omega-3 Fatty Acids (FISH OIL) 1000 MG CAPS Take 1,000 mg by mouth daily.  . polyethylene glycol (MIRALAX / GLYCOLAX) 17 g packet Take 17 g by mouth daily as needed for moderate  constipation.  . sacubitril-valsartan (ENTRESTO) 24-26 MG Take 1 tablet by mouth 2 (two) times daily.  . tamsulosin (FLOMAX) 0.4 MG CAPS capsule Take 1 capsule (0.4 mg total) by mouth daily.  Alveda Reasons 20 MG TABS tablet TAKE 1 TABLET (20 MG TOTAL) BY MOUTH DAILY WITH SUPPER.  . [DISCONTINUED] furosemide (LASIX) 20 MG tablet Take 1 tablet (20 mg total) by mouth daily.     Allergies:   Penicillins and Hydromorphone hcl   Social History   Socioeconomic History  . Marital status: Married    Spouse name: Not on file  . Number of children: Not on file  . Years of education: Not on file  . Highest education level: Not on file  Occupational History  . Occupation: retired    Fish farm manager: [employer]  . Financial resource strain: Not on file  . Food insecurity  Worry: Not on file    Inability: Not on file  . Transportation needs    Medical: Not on file    Non-medical: Not on file  Tobacco Use  . Smoking status: Former Smoker    Packs/day: 1.00    Years: 26.00    Pack years: 26.00    Types: Cigarettes    Quit date: 10/31/1973    Years since quitting: 45.5  . Smokeless tobacco: Never Used  Substance and Sexual Activity  . Alcohol use: No  . Drug use: No  . Sexual activity: Never  Lifestyle  . Physical activity    Days per week: Not on file    Minutes per session: Not on file  . Stress: Not on file  Relationships  . Social Herbalist on phone: Not on file    Gets together: Not on file    Attends religious service: Not on file    Active member of club or organization: Not on file    Attends meetings of clubs or organizations: Not on file    Relationship status: Not on file  Other Topics Concern  . Not on file  Social History Narrative  . Not on file     Family History:  The patient's family history includes CAD (age of onset: 72) in his brother; Cancer in an other family member; Heart disease in an other family member; Stroke in his brother.    ROS:   Please see the history of present illness.    ROS All other systems reviewed and are negative.   PHYSICAL EXAM:   VS:  BP (!) 108/52   Pulse 67   Ht '5\' 8"'  (1.727 m)   Wt 190 lb 12.8 oz (86.5 kg)   SpO2 97%   BMI 29.01 kg/m   Physical Exam  GEN: Thin, elderly, in no acute distress  Neck: no JVD, carotid bruits, or masses Cardiac:RRR; 2/6 systolic murmur at the left sternal border Respiratory:  clear to auscultation bilaterally, normal work of breathing GI: soft, nontender, nondistended, + BS Ext: Bilateral lower extremity edema right greater than left +3 on the right no erythema Neuro:  Alert and Oriented x 3 Psych: euthymic mood, full affect  Wt Readings from Last 3 Encounters:  05/15/19 190 lb 12.8 oz (86.5 kg)  05/02/19 201 lb 8 oz (91.4 kg)  02/13/19 185 lb (83.9 kg)      Studies/Labs Reviewed:   EKG:  EKG is not ordered today.  Recent Labs: 09/27/2018: B Natriuretic Peptide 539.4 11/20/2018: Magnesium 2.2 04/29/2019: ALT 20; TSH 3.504 05/02/2019: BUN 25; Creatinine, Ser 1.10; Hemoglobin 11.8; Platelets 157; Potassium 4.5; Sodium 136   Lipid Panel    Component Value Date/Time   CHOL 108 04/30/2019 0518   CHOL 108 11/17/2017 0848   TRIG 29 04/30/2019 0518   HDL 52 04/30/2019 0518   HDL 56 11/17/2017 0848   CHOLHDL 2.1 04/30/2019 0518   VLDL 6 04/30/2019 0518   LDLCALC 50 04/30/2019 0518   LDLCALC 39 11/17/2017 0848    Additional studies/ records that were reviewed today include:  Cath 11/2019Study Conclusions   - Left ventricle: The cavity size was normal. Wall thickness was   increased in a pattern of mild LVH. Systolic function was   moderately to severely reduced. The estimated ejection fraction   was in the range of 30% to 35%. Diffuse hypokinesis. The study is   not technically sufficient to allow evaluation of LV diastolic  function. LV filling pressure is elevated. - Aortic valve: Calcified leaflets. Moderate stenosis. Mean   gradient (S):  22 mm Hg. Valve area (VTI): 1.35 cm^2. Valve area   (Vmean): 1.5 cm^2. - Mitral valve: Mildly thickened leaflets . There was mild   regurgitation. - Left atrium: Moderately dilated. - Right ventricle: The cavity size was mildly dilated. - Right atrium: Severely dilated. - Tricuspid valve: There was mild regurgitation. - Pulmonary arteries: PA peak pressure: 46 mm Hg (S). - Inferior vena cava: The vessel was dilated. The respirophasic   diameter changes were blunted (< 50%), consistent with elevated   central venous pressure.   Impressions:   - Compared to a prior study in 2017, the LVEF is lower at 30-35%.   There is now moderate aortic stenosis with a mean gradient of 22   mmHg.       ASSESSMENT:    1. Edema, unspecified type   2. Acute on chronic combined systolic and diastolic CHF (congestive heart failure) ([place])   3. Coronary artery disease involving native coronary artery of native heart without angina pectoris   4. Atrial fibrillation, unspecified type ([place])   5. Essential hypertension, benign   6. Moderate aortic stenosis      PLAN:  In order of problems listed above:  Acute on chronic combined systolic and diastolic CHF with increased lower extremity edema worse on the right leg where he had his hip surgery.  Will double up on Lasix 40 mg daily for 4 days then back to 20 mg daily.  Check be met and BNP.  2 g sodium diet.  Follow-up with me next week.    Lower extremity edema right greater than left.  To see orthopedics today.  He has not missed any Xarelto doses.  Think most of this is probably fluid but worried about a blood clot.  He goes to them in the next 30 minutes.  CAD status post stents to the LAD and circumflex in 2014 no angina  Ischemic cardiomyopathy ejection fraction 30 to 35%  Paroxysmal atrial fibrillation on Xarelto also has a pacemaker  Essential hypertension blood pressure runs on the low side so be careful with extra Lasix  Moderate aortic  stenosis    Medication Adjustments/Labs and Tests Ordered: Current medicines are reviewed at length with the patient today.  Concerns regarding medicines are outlined above.  Medication changes, Labs and Tests ordered today are listed in the Patient Instructions below. Patient Instructions  Medication Instructions:  Your physician has recommended you make the following change in your medication:   Furosemide (lasix) 20 mg tablet: Take 2 tablets once a day for 4 days only then take 1 tablet once a day   Lab work: TODAY: BMET, BNP  If you have labs (blood work) drawn today and your tests are completely normal, you will receive your results only by: Marland Kitchen MyChart Message (if you have MyChart) OR . A paper copy in the mail If you have any lab test that is abnormal or we need to change your treatment, we will call you to review the results.  Testing/Procedures: None ordered  Follow-Up: . Follow up with Ermalinda Barrios, PA on 05/22/19 at 11:00 AM  Any Other Special Instructions Will Be Listed Below (If Applicable).       Signed, Ermalinda Barrios, PA-C  05/15/2019 1:37 PM    [facility] [address], [place], [place]  [address] Phone: [phone]; Fax: [phone]

## 2019-05-15 NOTE — Patient Instructions (Addendum)
Medication Instructions:  Your physician has recommended you make the following change in your medication:   Furosemide (lasix) 20 mg tablet: Take 2 tablets once a day for 4 days only then take 1 tablet once a day   Lab work: TODAY: BMET, BNP  If you have labs (blood work) drawn today and your tests are completely normal, you will receive your results only by: Marland Kitchen MyChart Message (if you have MyChart) OR . A paper copy in the mail If you have any lab test that is abnormal or we need to change your treatment, we will call you to review the results.  Testing/Procedures: None ordered  Follow-Up: . Follow up with Ermalinda Barrios, PA on 05/22/19 at 11:00 AM  Any Other Special Instructions Will Be Listed Below (If Applicable).

## 2019-05-16 LAB — BASIC METABOLIC PANEL
BUN/Creatinine Ratio: 13 (ref 10–24)
BUN: 15 mg/dL (ref 8–27)
CO2: 25 mmol/L (ref 20–29)
Calcium: 8.8 mg/dL (ref 8.6–10.2)
Chloride: 99 mmol/L (ref 96–106)
Creatinine, Ser: 1.18 mg/dL (ref 0.76–1.27)
GFR calc Af Amer: 63 mL/min/{1.73_m2} (ref >59)
GFR calc non Af Amer: 55 mL/min/{1.73_m2} — ABNORMAL LOW (ref >59)
Glucose: 100 mg/dL — ABNORMAL HIGH (ref 65–99)
Potassium: 4.8 mmol/L (ref 3.5–5.2)
Sodium: 140 mmol/L (ref 134–144)

## 2019-05-16 LAB — PRO B NATRIURETIC PEPTIDE: NT-Pro BNP: 418 pg/mL (ref 0–486)

## 2019-05-21 ENCOUNTER — Telehealth: Payer: Self-pay | Admitting: Physician Assistant

## 2019-05-21 DIAGNOSIS — I255 Ischemic cardiomyopathy: Secondary | ICD-10-CM | POA: Insufficient documentation

## 2019-05-21 NOTE — Progress Notes (Signed)
Cardiology Office Note    Date:  05/22/2019   ID:  [name], DOB 1929/07/17, MRN [id number]  PCP:  Tally Joe, MD  Cardiologist: Lance Muss, MD EPS: Hillis Range, MD  Chief Complaint  Patient presents with  . Follow-up    History of Present Illness:  Gregory Mccormick is a 85 y.o. 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    Was hospitalized and underwent right hip Hemiarthroplasty 04/30/2019 without complications. Patient had no trouble with heart failure or chest pain while in the hospital.  I saw patient 05/15/2019 with worsening leg edema right greater than left.  I increased his Lasix to 40 mg daily for 4 days then back to 20 mg daily.  BNP came back 418 which was within normal range.  Creatinine was normal.  He was seen in the orthopedist that same day and had not missed any Xarelto doses.  Patient comes in today accompanied by his daughter-in-law.  He walked in with a walker and looks 100% better.  He is feeling much better.  Edema down with the Lasix.  He has lost 6 pounds.  Denies complaints.   Past Medical History:  Diagnosis Date  . Anemia, unspecified   . Aortic stenosis   . Arthritis    "right knee" (03/28/2013  . CAD in native artery    takes Xarelto daily  . Cardiomyopathy (HCC)    EF 40%:  ECHO 11/25/2015 EF 55%-60%  . Carpal tunnel syndrome, bilateral   . Chronic systolic CHF (congestive heart failure) (HCC)   . CKD (chronic kidney disease), stage II   . Epistaxis   . Essential hypertension, benign    takes Metoprolol daily  . H/O hiatal hernia   . History of kidney stones   . Hyperlipidemia    takes Lipitor daily  . Nonspecific abnormal unspecified  cardiovascular function study   . OSA on CPAP    wears CPAP 02/03/16  . Pacemaker    MDT  . PAF (paroxysmal atrial fibrillation) (HCC)   . Paroxysmal ventricular tachycardia (HCC)   . PONV (postoperative nausea and vomiting)   . PVC's (premature ventricular contractions)   . Vitamin D deficiency     Past Surgical History:  Procedure Laterality Date  . CARDIAC CATHETERIZATION  03/21/2013  . CARDIOVERSION N/A 10/09/2018   Procedure: CARDIOVERSION;  Surgeon: Wendall Stade, MD;  Location: [facility] ENDOSCOPY;  Service: Cardiovascular;  Laterality: N/A;  . CARPAL TUNNEL RELEASE Right 02/05/2016   Procedure: RIGHT LIMITED OPEN CARPAL TUNNEL RELEASE;  Surgeon: Dominica Severin, MD;  Location: [facility] OR;  Service: Orthopedics;  Laterality: Right;  . CARPAL TUNNEL RELEASE Left 04/21/2016   Procedure: CARPAL TUNNEL RELEASE;  Surgeon: Dominica Severin, MD;  Location: [facility] OR;  Service: Orthopedics;  Laterality: Left;  . CATARACT EXTRACTION W/ INTRAOCULAR LENS  IMPLANT, BILATERAL Bilateral 2000's  . COLONOSCOPY    . CORONARY ANGIOPLASTY WITH STENT PLACEMENT  03/28/2013   "3 stents" (03/28/2013)  . HIP ARTHROPLASTY Right 04/30/2019   Procedure: ARTHROPLASTY BIPOLAR HIP (HEMIARTHROPLASTY);  Surgeon: Durene Romans, MD;  Location: [facility] ORS;  Service: Orthopedics;  Laterality: Right;  . INSERT / REPLACE / REMOVE PACEMAKER    . KNEE CARTILAGE SURGERY Left 1972   "cut it open" (  03/28/2013)  . KNEE CARTILAGE SURGERY Right ~ 1977   "cut it open" (03/28/2013)  . 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 PERCUTANEOUS CORONARY ROTOBLATOR INTERVENTION (PCI-R) N/A 03/28/2013   Procedure: PERCUTANEOUS CORONARY ROTOBLATOR INTERVENTION (PCI-R);  Surgeon: Jettie Booze, MD;  Location: [facility] CATH LAB;  Service: Cardiovascular;  Laterality: N/A;  . SEPTOPLASTY N/A 02/05/2016   Procedure: SEPTOPLASTY WITH INTRANASAL SKIN GRAFT;  Surgeon: Rozetta Nunnery, MD;  Location:  [facility];  Service: ENT;  Laterality: N/A;  . SHOULDER HEMI-ARTHROPLASTY Right 1987   "got hit by sled & dragged down sheet > 200 feet; tore shoulder up" (03/28/2013)  . SHOULDER SURGERY Right 1970's   "pulled muscle apart; sewed it back together" (03/28/2013  . TOTAL KNEE ARTHROPLASTY Right 2007  . TOTAL KNEE ARTHROPLASTY  11/26/2012   Procedure: TOTAL KNEE ARTHROPLASTY;  Surgeon: Gearlean Alf, MD;  Location: [facility] ORS;  Service: Orthopedics;  Laterality: Left;  . TRANSURETHRAL RESECTION OF PROSTATE N/A 12/27/2017   Procedure: TRANSURETHRAL RESECTION OF THE PROSTATE (TURP);  Surgeon: Lucas Mallow, MD;  Location[facility];  Service: Urology;  Laterality: N/A;    Current Medications: Current Meds  Medication Sig  . amiodarone (PACERONE) 200 MG tablet Take 1 tablet (200 mg total) by mouth daily.  Marland Kitchen atorvastatin (LIPITOR) 10 MG tablet TAKE 1 TABLET BY MOUTH DAILY AT 6 PM.  . furosemide (LASIX) 20 MG tablet Take 2 tablets (40 mg) daily for 4 days and then take 1 tablet (20 mg) once a day  . isosorbide mononitrate (IMDUR) 30 MG 24 hr tablet isosorbide mononitrate ER 30 mg tablet,extended release 24 hr  TAKE 1 TABLET BY MOUTH EVERY DAY  . Metoprolol Succinate 25 MG CS24 Take 25 mg by mouth daily.  . Multiple Vitamins-Minerals (PRESERVISION AREDS 2 PO) Take 1 capsule by mouth 2 (two) times daily.   . nitroGLYCERIN (NITROSTAT) 0.4 MG SL tablet Place 1 tablet (0.4 mg total) under the tongue every 5 (five) minutes as needed for chest pain.  . Omega-3 Fatty Acids (FISH OIL) 1000 MG CAPS Take 1,000 mg by mouth daily.  . sacubitril-valsartan (ENTRESTO) 24-26 MG Take 1 tablet by mouth 2 (two) times daily.  . tamsulosin (FLOMAX) 0.4 MG CAPS capsule Take 1 capsule (0.4 mg total) by mouth daily.  Alveda Reasons 20 MG TABS tablet TAKE 1 TABLET (20 MG TOTAL) BY MOUTH DAILY WITH SUPPER.     Allergies:   Penicillins and Hydromorphone hcl   Social History   Socioeconomic History  . Marital  status: Married    Spouse name: Not on file  . Number of children: Not on file  . Years of education: Not on file  . Highest education level: Not on file  Occupational History  . Occupation: retired    Fish farm manager: [employer]  . Financial resource strain: Not on file  . Food insecurity    Worry: Not on file    Inability: Not on file  . Transportation needs    Medical: Not on file    Non-medical: Not on file  Tobacco Use  . Smoking status: Former Smoker    Packs/day: 1.00    Years: 26.00    Pack years: 26.00    Types: Cigarettes    Quit date: 10/31/1973    Years since quitting: 45.5  . Smokeless tobacco: Never Used  Substance and Sexual  Activity  . Alcohol use: No  . Drug use: No  . Sexual activity: Never  Lifestyle  . Physical activity    Days per week: Not on file    Minutes per session: Not on file  . Stress: Not on file  Relationships  . Social Musicianconnections    Talks on phone: Not on file    Gets together: Not on file    Attends religious service: Not on file    Active member of club or organization: Not on file    Attends meetings of clubs or organizations: Not on file    Relationship status: Not on file  Other Topics Concern  . Not on file  Social History Narrative  . Not on file     Family History:  The patient's   family history includes CAD (age of onset: 3475) in his brother; Cancer in an other family member; Heart disease in an other family member; Stroke in his brother.   ROS:   Please see the history of present illness.    ROS All other systems reviewed and are negative.   PHYSICAL EXAM:   VS:  BP 117/65   Pulse 62   Ht 5\' 8"  (1.727 m)   Wt 184 lb (83.5 kg)   BMI 27.98 kg/m   Physical Exam  GEN: Well nourished, well developed, in no acute distress  Neck: no JVD, carotid bruits, or masses Cardiac:RRR; 2/6 harsh systolic murmur at the left sternal border respiratory:  clear to auscultation bilaterally, normal work of breathing GI:  soft, nontender, nondistended, + BS Ext: 1+ edema bilaterally with significant varicosities edema slightly more on the right and left  neuro:  Alert and Oriented x 3 Psych: euthymic mood, full affect  Wt Readings from Last 3 Encounters:  05/22/19 184 lb (83.5 kg)  05/15/19 190 lb 12.8 oz (86.5 kg)  05/02/19 201 lb 8 oz (91.4 kg)      Studies/Labs Reviewed:   EKG:  EKG is not ordered today.    Recent Labs: 09/27/2018: B Natriuretic Peptide 539.4 11/20/2018: Magnesium 2.2 04/29/2019: ALT 20; TSH 3.504 05/02/2019: Hemoglobin 11.8; Platelets 157 05/15/2019: BUN 15; Creatinine, Ser 1.18; NT-Pro BNP 418; Potassium 4.8; Sodium 140   Lipid Panel    Component Value Date/Time   CHOL 108 04/30/2019 0518   CHOL 108 11/17/2017 0848   TRIG 29 04/30/2019 0518   HDL 52 04/30/2019 0518   HDL 56 11/17/2017 0848   CHOLHDL 2.1 04/30/2019 0518   VLDL 6 04/30/2019 0518   LDLCALC 50 04/30/2019 0518   LDLCALC 39 11/17/2017 0848    Additional studies/ records that were reviewed today include:    Echo 11/2019Study Conclusions   - Left ventricle: The cavity size was normal. Wall thickness was   increased in a pattern of mild LVH. Systolic function was   moderately to severely reduced. The estimated ejection fraction   was in the range of 30% to 35%. Diffuse hypokinesis. The study is   not technically sufficient to allow evaluation of LV diastolic   function. LV filling pressure is elevated. - Aortic valve: Calcified leaflets. Moderate stenosis. Mean   gradient (S): 22 mm Hg. Valve area (VTI): 1.35 cm^2. Valve area   (Vmean): 1.5 cm^2. - Mitral valve: Mildly thickened leaflets . There was mild   regurgitation. - Left atrium: Moderately dilated. - Right ventricle: The cavity size was mildly dilated. - Right atrium: Severely dilated. - Tricuspid valve: There was mild regurgitation. - Pulmonary arteries:  PA peak pressure: 46 mm Hg (S). - Inferior vena cava: The vessel was dilated. The  respirophasic   diameter changes were blunted (< 50%), consistent with elevated   central venous pressure.   Impressions:   - Compared to a prior study in 2017, the LVEF is lower at 30-35%.   There is now moderate aortic stenosis with a mean gradient of 22   mmHg.            ASSESSMENT:    1. Acute on chronic combined systolic and diastolic CHF (congestive heart failure) (HCC)   2. Ischemic cardiomyopathy   3. Coronary artery disease involving native coronary artery of native heart without angina pectoris   4. Atrial fibrillation, unspecified type (HCC)   5. Essential hypertension, benign   6. Moderate aortic stenosis      PLAN:  In order of problems listed above:  Chronic combined systolic and diastolic CHF last echo 08/2018 LVEF 30 to 35% with diffuse hypokinesis-edema greatly improved with Lasix.  Still has a little bit.  We will continue low-dose Lasix 20 mg daily.  Check renal function today.  2 g sodium diet.  Compression stockings.  Follow-up with Dr. Eldridge DaceVaranasi in November after echo.  Ischemic cardiomyopathy ejection fraction 30 to 35%-continue Lasix.  CAD status post stents to the LAD and circumflex in 2014 without angina  PAF on Xarelto also has pacemaker  Essential hypertension BP  runs low  Moderate aortic stenosis recheck echo in November  Medication Adjustments/Labs and Tests Ordered: Current medicines are reviewed at length with the patient today.  Concerns regarding medicines are outlined above.  Medication changes, Labs and Tests ordered today are listed in the Patient Instructions below. Patient Instructions  Medication Instructions:   Your physician recommends that you continue on your current medications as directed. Please refer to the Current Medication list given to you today.  If you need a refill on your cardiac medications before your next appointment, please call your pharmacy.   Lab work:  You will have labs drawn today: BMET  If you  have labs (blood work) drawn today and your tests are completely normal, you will receive your results only by: Marland Kitchen. MyChart Message (if you have MyChart) OR . A paper copy in the mail If you have any lab test that is abnormal or we need to change your treatment, we will call you to review the results.  Testing/Procedures:  Your physician has requested that you have an echocardiogram on 09/23/19 at 11:35AM. Echocardiography is a painless test that uses sound waves to create images of your heart. It provides your doctor with information about the size and shape of your heart and how well your heart's chambers and valves are working. This procedure takes approximately one hour. There are no restrictions for this procedure.    Follow-Up:  With Dr. Lance MussJayadeep Varanasi MD after Echocardiogram        Signed, Jacolyn ReedyMichele Slate Debroux, PA-C  05/22/2019 11:27 AM    [facility] [address], [place], [place]  [id number] Phone: [phone]; Fax: [phone]

## 2019-05-21 NOTE — Telephone Encounter (Signed)

## 2019-05-22 ENCOUNTER — Other Ambulatory Visit: Payer: Self-pay

## 2019-05-22 ENCOUNTER — Encounter: Payer: Self-pay | Admitting: Physician Assistant

## 2019-05-22 ENCOUNTER — Ambulatory Visit (INDEPENDENT_AMBULATORY_CARE_PROVIDER_SITE_OTHER): Payer: Medicare Other | Admitting: Physician Assistant

## 2019-05-22 VITALS — BP 117/65 | HR 62 | Ht 68.0 in | Wt 184.0 lb

## 2019-05-22 DIAGNOSIS — I350 Nonrheumatic aortic (valve) stenosis: Secondary | ICD-10-CM

## 2019-05-22 DIAGNOSIS — I2510 Atherosclerotic heart disease of native coronary artery without angina pectoris: Secondary | ICD-10-CM | POA: Diagnosis not present

## 2019-05-22 DIAGNOSIS — I255 Ischemic cardiomyopathy: Secondary | ICD-10-CM

## 2019-05-22 DIAGNOSIS — I5043 Acute on chronic combined systolic (congestive) and diastolic (congestive) heart failure: Secondary | ICD-10-CM | POA: Diagnosis not present

## 2019-05-22 DIAGNOSIS — I4891 Unspecified atrial fibrillation: Secondary | ICD-10-CM | POA: Diagnosis not present

## 2019-05-22 DIAGNOSIS — I10 Essential (primary) hypertension: Secondary | ICD-10-CM

## 2019-05-22 LAB — BASIC METABOLIC PANEL
BUN/Creatinine Ratio: 13 (ref 10–24)
BUN: 16 mg/dL (ref 8–27)
CO2: 28 mmol/L (ref 20–29)
Calcium: 9 mg/dL (ref 8.6–10.2)
Chloride: 99 mmol/L (ref 96–106)
Creatinine, Ser: 1.24 mg/dL (ref 0.76–1.27)
GFR calc Af Amer: 60 mL/min/{1.73_m2} (ref >59)
GFR calc non Af Amer: 52 mL/min/{1.73_m2} — ABNORMAL LOW (ref >59)
Glucose: 78 mg/dL (ref 65–99)
Potassium: 4.8 mmol/L (ref 3.5–5.2)
Sodium: 141 mmol/L (ref 134–144)

## 2019-05-22 NOTE — Patient Instructions (Addendum)
Medication Instructions:   Your physician recommends that you continue on your current medications as directed. Please refer to the Current Medication list given to you today.  If you need a refill on your cardiac medications before your next appointment, please call your pharmacy.   Lab work:  You will have labs drawn today: BMET  If you have labs (blood work) drawn today and your tests are completely normal, you will receive your results only by: Marland Kitchen MyChart Message (if you have MyChart) OR . A paper copy in the mail If you have any lab test that is abnormal or we need to change your treatment, we will call you to review the results.  Testing/Procedures:  Your physician has requested that you have an echocardiogram on 09/23/19 at 11:35AM. Echocardiography is a painless test that uses sound waves to create images of your heart. It provides your doctor with information about the size and shape of your heart and how well your heart's chambers and valves are working. This procedure takes approximately one hour. There are no restrictions for this procedure.    Follow-Up:  With Dr. Larae Grooms MD after Echocardiogram

## 2019-05-23 ENCOUNTER — Telehealth: Payer: Self-pay | Admitting: Physician Assistant

## 2019-05-23 NOTE — Telephone Encounter (Signed)
New Message ° ° ° °Patient is returning call in reference to lab results. Please call.  °

## 2019-05-23 NOTE — Telephone Encounter (Signed)
I spoke with pt and reviewed BMP results with him 

## 2019-05-28 ENCOUNTER — Ambulatory Visit (INDEPENDENT_AMBULATORY_CARE_PROVIDER_SITE_OTHER): Payer: Medicare Other | Admitting: *Deleted

## 2019-05-28 DIAGNOSIS — I4430 Unspecified atrioventricular block: Secondary | ICD-10-CM

## 2019-05-29 ENCOUNTER — Telehealth: Payer: Self-pay

## 2019-05-29 LAB — CUP PACEART REMOTE DEVICE CHECK
Battery Impedance: 1132 Ohm
Battery Remaining Longevity: 57 mo
Battery Voltage: 2.77 V
Brady Statistic AP VP Percent: 2 %
Brady Statistic AP VS Percent: 78 %
Brady Statistic AS VP Percent: 3 %
Brady Statistic AS VS Percent: 17 %
Date Time Interrogation Session: 20200729135907
Implantable Lead Implant Date: 20111206
Implantable Lead Implant Date: 20111206
Implantable Lead Location: 753859
Implantable Lead Location: 753860
Implantable Lead Model: 5076
Implantable Lead Model: 5092
Implantable Pulse Generator Implant Date: 20111206
Lead Channel Impedance Value: 419 Ohm
Lead Channel Impedance Value: 781 Ohm
Lead Channel Pacing Threshold Amplitude: 0.75 V
Lead Channel Pacing Threshold Amplitude: 1 V
Lead Channel Pacing Threshold Pulse Width: 0.4 ms
Lead Channel Pacing Threshold Pulse Width: 0.4 ms
Lead Channel Setting Pacing Amplitude: 2 V
Lead Channel Setting Pacing Amplitude: 2.5 V
Lead Channel Setting Pacing Pulse Width: 0.4 ms
Lead Channel Setting Sensing Sensitivity: 5.6 mV

## 2019-05-29 NOTE — Telephone Encounter (Signed)
Spoke with patient to remind of missed remote transmission 

## 2019-06-10 ENCOUNTER — Encounter: Payer: Self-pay | Admitting: Cardiology

## 2019-06-10 NOTE — Progress Notes (Signed)
Remote pacemaker transmission.   

## 2019-08-27 ENCOUNTER — Encounter: Payer: Medicare Other | Admitting: *Deleted

## 2019-09-06 ENCOUNTER — Emergency Department (HOSPITAL_COMMUNITY): Payer: Medicare Other

## 2019-09-06 ENCOUNTER — Encounter (HOSPITAL_COMMUNITY): Payer: Self-pay | Admitting: *Deleted

## 2019-09-06 ENCOUNTER — Observation Stay (HOSPITAL_COMMUNITY)
Admission: EM | Admit: 2019-09-06 | Discharge: 2019-09-07 | Disposition: A | Discharge status: Home / Self Care | Payer: Medicare Other | Attending: Internal Medicine | Admitting: Internal Medicine

## 2019-09-06 ENCOUNTER — Other Ambulatory Visit: Payer: Self-pay

## 2019-09-06 ENCOUNTER — Telehealth: Payer: Self-pay | Admitting: Interventional Cardiology

## 2019-09-06 DIAGNOSIS — I2510 Atherosclerotic heart disease of native coronary artery without angina pectoris: Secondary | ICD-10-CM | POA: Insufficient documentation

## 2019-09-06 DIAGNOSIS — N400 Enlarged prostate without lower urinary tract symptoms: Secondary | ICD-10-CM | POA: Diagnosis not present

## 2019-09-06 DIAGNOSIS — Z96653 Presence of artificial knee joint, bilateral: Secondary | ICD-10-CM | POA: Diagnosis not present

## 2019-09-06 DIAGNOSIS — Z950 Presence of cardiac pacemaker: Secondary | ICD-10-CM | POA: Diagnosis not present

## 2019-09-06 DIAGNOSIS — Z7901 Long term (current) use of anticoagulants: Secondary | ICD-10-CM | POA: Diagnosis not present

## 2019-09-06 DIAGNOSIS — I480 Paroxysmal atrial fibrillation: Secondary | ICD-10-CM | POA: Insufficient documentation

## 2019-09-06 DIAGNOSIS — Z20828 Contact with and (suspected) exposure to other viral communicable diseases: Secondary | ICD-10-CM | POA: Insufficient documentation

## 2019-09-06 DIAGNOSIS — Z87891 Personal history of nicotine dependence: Secondary | ICD-10-CM | POA: Insufficient documentation

## 2019-09-06 DIAGNOSIS — N182 Chronic kidney disease, stage 2 (mild): Secondary | ICD-10-CM | POA: Insufficient documentation

## 2019-09-06 DIAGNOSIS — R42 Dizziness and giddiness: Secondary | ICD-10-CM | POA: Diagnosis present

## 2019-09-06 DIAGNOSIS — I4891 Unspecified atrial fibrillation: Secondary | ICD-10-CM | POA: Diagnosis present

## 2019-09-06 DIAGNOSIS — E785 Hyperlipidemia, unspecified: Secondary | ICD-10-CM | POA: Diagnosis not present

## 2019-09-06 DIAGNOSIS — Z885 Allergy status to narcotic agent status: Secondary | ICD-10-CM | POA: Insufficient documentation

## 2019-09-06 DIAGNOSIS — Z880 Allergy status to penicillin: Secondary | ICD-10-CM | POA: Insufficient documentation

## 2019-09-06 DIAGNOSIS — I5022 Chronic systolic (congestive) heart failure: Secondary | ICD-10-CM | POA: Insufficient documentation

## 2019-09-06 DIAGNOSIS — G4733 Obstructive sleep apnea (adult) (pediatric): Secondary | ICD-10-CM

## 2019-09-06 DIAGNOSIS — I130 Hypertensive heart and chronic kidney disease with heart failure and stage 1 through stage 4 chronic kidney disease, or unspecified chronic kidney disease: Secondary | ICD-10-CM | POA: Insufficient documentation

## 2019-09-06 DIAGNOSIS — Z79899 Other long term (current) drug therapy: Secondary | ICD-10-CM | POA: Insufficient documentation

## 2019-09-06 DIAGNOSIS — N179 Acute kidney failure, unspecified: Secondary | ICD-10-CM | POA: Diagnosis not present

## 2019-09-06 DIAGNOSIS — M1711 Unilateral primary osteoarthritis, right knee: Secondary | ICD-10-CM | POA: Insufficient documentation

## 2019-09-06 LAB — TROPONIN I (HIGH SENSITIVITY): Troponin I (High Sensitivity): 9 ng/L (ref <18)

## 2019-09-06 LAB — CBC
HCT: 42.5 % (ref 39.0–52.0)
Hemoglobin: 13.3 g/dL (ref 13.0–17.0)
MCH: 30.9 pg (ref 26.0–34.0)
MCHC: 31.3 g/dL (ref 30.0–36.0)
MCV: 98.6 fL (ref 80.0–100.0)
Platelets: 172 10*3/uL (ref 150–400)
RBC: 4.31 MIL/uL (ref 4.22–5.81)
RDW: 13.9 % (ref 11.5–15.5)
WBC: 6 10*3/uL (ref 4.0–10.5)
nRBC: 0 % (ref 0.0–0.2)

## 2019-09-06 LAB — COMPREHENSIVE METABOLIC PANEL
ALT: 20 U/L (ref 0–44)
AST: 25 U/L (ref 15–41)
Albumin: 3.8 g/dL (ref 3.5–5.0)
Alkaline Phosphatase: 81 U/L (ref 38–126)
Anion gap: 10 (ref 5–15)
BUN: 18 mg/dL (ref 8–23)
CO2: 26 mmol/L (ref 22–32)
Calcium: 8.9 mg/dL (ref 8.9–10.3)
Chloride: 104 mmol/L (ref 98–111)
Creatinine, Ser: 1.71 mg/dL — ABNORMAL HIGH (ref 0.61–1.24)
GFR calc Af Amer: 41 mL/min — ABNORMAL LOW (ref >60)
GFR calc non Af Amer: 35 mL/min — ABNORMAL LOW (ref >60)
Glucose, Bld: 119 mg/dL — ABNORMAL HIGH (ref 70–99)
Potassium: 4.6 mmol/L (ref 3.5–5.1)
Sodium: 140 mmol/L (ref 135–145)
Total Bilirubin: 0.8 mg/dL (ref 0.3–1.2)
Total Protein: 6.5 g/dL (ref 6.5–8.1)

## 2019-09-06 LAB — DIFFERENTIAL
Abs Immature Granulocytes: 0.02 10*3/uL (ref 0.00–0.07)
Basophils Absolute: 0 10*3/uL (ref 0.0–0.1)
Basophils Relative: 0 %
Eosinophils Absolute: 0 10*3/uL (ref 0.0–0.5)
Eosinophils Relative: 1 %
Immature Granulocytes: 0 %
Lymphocytes Relative: 10 %
Lymphs Abs: 0.6 10*3/uL — ABNORMAL LOW (ref 0.7–4.0)
Monocytes Absolute: 0.5 10*3/uL (ref 0.1–1.0)
Monocytes Relative: 8 %
Neutro Abs: 4.9 10*3/uL (ref 1.7–7.7)
Neutrophils Relative %: 81 %

## 2019-09-06 LAB — PROTIME-INR
INR: 2.3 — ABNORMAL HIGH (ref 0.8–1.2)
Prothrombin Time: 24.7 seconds — ABNORMAL HIGH (ref 11.4–15.2)

## 2019-09-06 LAB — APTT: aPTT: 46 seconds — ABNORMAL HIGH (ref 24–36)

## 2019-09-06 MED ORDER — SODIUM CHLORIDE 0.9% FLUSH: 3.0000 mL | Freq: Once | INTRAVENOUS | Status: DC

## 2019-09-06 MED ORDER — TAMSULOSIN HCL 0.4 MG PO CAPS
0.4000 mg | ORAL_CAPSULE | Freq: Every day | ORAL | Status: DC
  Administered 2019-09-07: 0.4 mg via ORAL
  Filled 2019-09-06: qty 1

## 2019-09-06 MED ORDER — RIVAROXABAN 20 MG PO TABS: 20.0000 mg | ORAL_TABLET | Freq: Every day | ORAL | Status: DC

## 2019-09-06 MED ORDER — ATORVASTATIN CALCIUM 10 MG PO TABS: 10.0000 mg | ORAL_TABLET | Freq: Every day | ORAL | Status: DC

## 2019-09-06 MED ORDER — AMIODARONE HCL 200 MG PO TABS
200.0000 mg | ORAL_TABLET | Freq: Every day | ORAL | Status: DC
  Administered 2019-09-07: 200 mg via ORAL
  Filled 2019-09-06: qty 1

## 2019-09-06 MED ORDER — MECLIZINE HCL 25 MG PO TABS
12.5000 mg | ORAL_TABLET | Freq: Once | ORAL | Status: AC
  Administered 2019-09-06: 22:00:00 12.5 mg via ORAL
  Filled 2019-09-06: qty 1

## 2019-09-06 MED ORDER — MECLIZINE HCL 25 MG PO TABS
12.5000 mg | ORAL_TABLET | Freq: Two times a day (BID) | ORAL | Status: DC
  Administered 2019-09-07: 12.5 mg via ORAL
  Filled 2019-09-06: qty 1

## 2019-09-06 MED ORDER — ACETAMINOPHEN 325 MG PO TABS: 650.0000 mg | ORAL_TABLET | Freq: Four times a day (QID) | ORAL | Status: DC | PRN

## 2019-09-06 MED ORDER — ACETAMINOPHEN 650 MG RE SUPP: 650.0000 mg | Freq: Four times a day (QID) | RECTAL | Status: DC | PRN

## 2019-09-06 MED ORDER — SODIUM CHLORIDE 0.9 % IV BOLUS
500.0000 mL | Freq: Once | INTRAVENOUS | Status: AC
  Administered 2019-09-06: 22:00:00 500 mL via INTRAVENOUS

## 2019-09-06 NOTE — Telephone Encounter (Signed)
New Message  STAT if patient feels like he/she is going to faint   1) Are you dizzy now? Yes  2) Do you feel faint or have you passed out? Yes  3) Do you have any other symptoms? Feeling extremely tired, nervousness,   4) Have you checked your HR and BP (record if available)? BP 95/75 HR 105

## 2019-09-06 NOTE — Telephone Encounter (Signed)
Patient transmission reviewed. No alerts since 11/01/18. Device function WNL. Patient reports he has been so unsteady on his feet since he woke up that he can not keep his balance. C/o SOB, no CP and no syncope. Due to ataxia, weakness, and SOB recommended patient be seen in ED to be evaluated. Patient will contact his son to take him to [facility] ED.

## 2019-09-06 NOTE — Progress Notes (Signed)
Spoke with pt about CPAP he states he has not worn in years and does not want to wear one here. I told him if he changes his mind to call RT.

## 2019-09-06 NOTE — Telephone Encounter (Signed)
I spoke to the patient who is feeling dizzy, tired and weak this morning.  His HR is 105 and BP 95/75.  He is sending in a transmission for further review/advisement.  He denies CP, but is a little SOB.

## 2019-09-06 NOTE — ED Triage Notes (Signed)
When pt went to bed at 0930 pm he felt normal.  When he woke up this am at 0730 he felt dizzy, like he was walking like a drunk.  Denies a headache, but he states he feels awful.

## 2019-09-06 NOTE — ED Notes (Signed)
Report given to 5W RN. All questions answered 

## 2019-09-06 NOTE — ED Provider Notes (Signed)
[facility] EMERGENCY DEPARTMENT Provider Note   CSN: [id number] Arrival date & time: 09/06/19  1411     History   Chief Complaint Chief Complaint  Patient presents with  . Dizziness    HPI Gregory Mccormick is a 85 y.o. male.     HPI  85 year old male presents with a chief complaint of dizziness.  The patient felt off when he first woke up.  He has felt somewhat weak and was transiently short of breath this morning.  He also felt like he would fall over when he walked.  Was not following a particular way.  Head does not hurt but feels odd.  No blurry or double vision.  No weakness or numbness.  No chest pain.  The patient previously was feeling fine when he went to bed last night.  Has a pacemaker and center report to his cardiologist who stated that there were no events.  Past Medical History:  Diagnosis Date  . Anemia, unspecified   . Aortic stenosis   . Arthritis    "right knee" (03/28/2013  . CAD in native artery    takes Xarelto daily  . Cardiomyopathy ([place])    EF 40%:  ECHO 11/25/2015 EF 55%-60%  . Carpal tunnel syndrome, bilateral   . Chronic systolic CHF (congestive heart failure) ([place])   . CKD (chronic kidney disease), stage II   . Epistaxis   . Essential hypertension, benign    takes Metoprolol daily  . H/O hiatal hernia   . History of kidney stones   . Hyperlipidemia    takes Lipitor daily  . Nonspecific abnormal unspecified cardiovascular function study   . OSA on CPAP    wears CPAP 02/03/16  . Pacemaker    MDT  . PAF (paroxysmal atrial fibrillation) ([place])   . Paroxysmal ventricular tachycardia ([place])   . PONV (postoperative nausea and vomiting)   . PVC's (premature ventricular contractions)   . Vitamin D deficiency     Patient Active Problem List   Diagnosis Date Noted  . Dizziness 09/06/2019  . AKI (acute kidney injury) ([place]) 09/06/2019  . Ischemic cardiomyopathy 05/21/2019  . Edema 05/15/2019  . Acute on chronic combined systolic  and diastolic CHF (congestive heart failure) ([place]) 05/15/2019  . CAD (coronary artery disease) 05/15/2019  . S/P right hip hemiarthroplasty 04/30/2019  . Moderate aortic stenosis   . Closed right hip fracture ([place]) 04/28/2019  . Fracture of femoral neck, right, closed ([place]) 04/28/2019  . Chronic systolic CHF (congestive heart failure) ([place]) 04/28/2019  . Hypothyroidism 04/28/2019  . Fatigue 11/07/2018  . Presence of cardiac pacemaker 11/07/2018  . OSA on CPAP 09/27/2018  . Acute renal failure superimposed on stage 2 chronic kidney disease ([place]) 09/27/2018  . Chronic diastolic (congestive) heart failure ([place]) 09/27/2018  . Chest pain 09/27/2018  . BPH (benign prostatic hyperplasia) 12/27/2017  . Atrial fibrillation ([place]) 01/25/2016  . Hyperlipidemia 10/06/2015  . Coronary atherosclerosis of native coronary artery 02/13/2014  . Chronic systolic dysfunction of left ventricle 12/05/2013  . Nonspecific abnormal unspecified cardiovascular function study   . Other primary cardiomyopathies   . Paroxysmal ventricular tachycardia ([place])   . Postop Acute blood loss anemia 11/27/2012  . Postop Hyponatremia 11/27/2012  . OA (osteoarthritis) of knee 11/26/2012  . Mobitz type II atrioventricular block 01/24/2011  . Essential hypertension, benign 09/27/2010  . ATRIOVENTRICULAR BLOCK, 2ND DEGREE 09/27/2010  . SYNCOPE 09/27/2010    Past Surgical History:  Procedure Laterality Date  . CARDIAC  CATHETERIZATION  03/21/2013  . CARDIOVERSION N/A 10/09/2018   Procedure: CARDIOVERSION;  Surgeon: Wendall Stade, MD;  Location: [facility] ENDOSCOPY;  Service: Cardiovascular;  Laterality: N/A;  . CARPAL TUNNEL RELEASE Right 02/05/2016   Procedure: RIGHT LIMITED OPEN CARPAL TUNNEL RELEASE;  Surgeon: Dominica Severin, MD;  Location: [facility] OR;  Service: Orthopedics;  Laterality: Right;  . CARPAL TUNNEL RELEASE Left 04/21/2016   Procedure: CARPAL TUNNEL RELEASE;  Surgeon: Dominica Severin, MD;  Location: [facility] OR;  Service:  Orthopedics;  Laterality: Left;  . CATARACT EXTRACTION W/ INTRAOCULAR LENS  IMPLANT, BILATERAL Bilateral 2000's  . COLONOSCOPY    . CORONARY ANGIOPLASTY WITH STENT PLACEMENT  03/28/2013   "3 stents" (03/28/2013)  . HIP ARTHROPLASTY Right 04/30/2019   Procedure: ARTHROPLASTY BIPOLAR HIP (HEMIARTHROPLASTY);  Surgeon: Durene Romans, MD;  Location: [facility] ORS;  Service: Orthopedics;  Laterality: Right;  . INSERT / REPLACE / REMOVE PACEMAKER    . KNEE CARTILAGE SURGERY Left 1972   "cut it open" (03/28/2013)  . KNEE CARTILAGE SURGERY Right ~ 1977   "cut it open" (03/28/2013)  . PACEMAKER INSERTION  10/05/10   MDT Adapta L implanted by Dr [name] for mobitz II second degree AV block  . PERCUTANEOUS CORONARY ROTOBLATOR INTERVENTION (PCI-R) N/A 03/28/2013   Procedure: PERCUTANEOUS CORONARY ROTOBLATOR INTERVENTION (PCI-R);  Surgeon: Corky Crafts, MD;  Location: [facility] CATH LAB;  Service: Cardiovascular;  Laterality: N/A;  . SEPTOPLASTY N/A 02/05/2016   Procedure: SEPTOPLASTY WITH INTRANASAL SKIN GRAFT;  Surgeon: Drema Halon, MD;  Location: [facility] OR;  Service: ENT;  Laterality: N/A;  . SHOULDER HEMI-ARTHROPLASTY Right 1987   "got hit by sled & dragged down sheet > 200 feet; tore shoulder up" (03/28/2013)  . SHOULDER SURGERY Right 1970's   "pulled muscle apart; sewed it back together" (03/28/2013  . TOTAL KNEE ARTHROPLASTY Right 2007  . TOTAL KNEE ARTHROPLASTY  11/26/2012   Procedure: TOTAL KNEE ARTHROPLASTY;  Surgeon: Loanne Drilling, MD;  Location: [facility] ORS;  Service: Orthopedics;  Laterality: Left;  . TRANSURETHRAL RESECTION OF PROSTATE N/A 12/27/2017   Procedure: TRANSURETHRAL RESECTION OF THE PROSTATE (TURP);  Surgeon: Crista Elliot, MD;  Location: [facility];  Service: Urology;  Laterality: N/A;        Home Medications    Prior to Admission medications   Medication Sig Start Date End Date Taking? Authorizing Provider  amiodarone (PACERONE) 200 MG tablet Take 1 tablet (200 mg  total) by mouth daily. 11/08/18  Yes Berton Bon, NP  atorvastatin (LIPITOR) 10 MG tablet TAKE 1 TABLET BY MOUTH DAILY AT 6 PM. Patient taking differently: Take 10 mg by mouth daily at 6 PM.  01/01/19  Yes Corky Crafts, MD  furosemide (LASIX) 20 MG tablet Take 2 tablets (40 mg) daily for 4 days and then take 1 tablet (20 mg) once a day Patient taking differently: Take 20 mg by mouth daily.  05/15/19  Yes Dyann Kief, PA-C  isosorbide mononitrate (IMDUR) 30 MG 24 hr tablet Take 30 mg by mouth daily.    Yes [provider]  Metoprolol Succinate 25 MG CS24 Take 25 mg by mouth daily. 03/22/19  Yes Corky Crafts, MD  Multiple Vitamins-Minerals (PRESERVISION AREDS 2 PO) Take 1 capsule by mouth 2 (two) times daily.    Yes [provider]  nitroGLYCERIN (NITROSTAT) 0.4 MG SL tablet Place 1 tablet (0.4 mg total) under the tongue every 5 (five) minutes as needed for chest pain. 11/17/17  Yes Lance Muss  S, MD  Omega-3 Fatty Acids (FISH OIL) 1000 MG CAPS Take 1,000 mg by mouth daily.   Yes [provider]  sacubitril-valsartan (ENTRESTO) 24-26 MG Take 1 tablet by mouth 2 (two) times daily. 11/07/18  Yes Berton BonHammond, Janine, NP  tamsulosin (FLOMAX) 0.4 MG CAPS capsule Take 1 capsule (0.4 mg total) by mouth daily. 05/03/19  Yes Rai, Ripudeep K, MD  XARELTO 20 MG TABS tablet TAKE 1 TABLET (20 MG TOTAL) BY MOUTH DAILY WITH SUPPER. Patient taking differently: Take 20 mg by mouth daily with supper.  04/29/19  Yes Allred, Fayrene FearingJames, MD  docusate sodium (COLACE) 100 MG capsule Take 1 capsule (100 mg total) by mouth 2 (two) times daily. Patient not taking: Reported on 09/06/2019 05/02/19   Rai, Delene Ruffiniipudeep K, MD  ferrous sulfate 325 (65 FE) MG tablet Take 1 tablet (325 mg total) by mouth 3 (three) times daily after meals for 14 days. Patient not taking: Reported on 09/06/2019 05/01/19 05/15/19  Tommie ArdStinson, Ashley R, PA-C  HYDROcodone-acetaminophen [facility](NORCO) 7.5-325 MG tablet Take 1-2 tablets by  mouth every 4 (four) hours as needed for severe pain (pain score 7-10). Patient not taking: Reported on 05/22/2019 05/01/19   Tommie ArdStinson, Ashley R, PA-C  methocarbamol (ROBAXIN) 500 MG tablet Take 1 tablet (500 mg total) by mouth every 6 (six) hours as needed for muscle spasms. Patient not taking: Reported on 05/22/2019 05/01/19   Tommie ArdStinson, Ashley R, PA-C  polyethylene glycol (MIRALAX / GLYCOLAX) 17 g packet Take 17 g by mouth daily as needed for moderate constipation. Patient not taking: Reported on 09/06/2019 05/02/19   Cathren Harshai, Ripudeep K, MD    Family History Family History  Problem Relation Age of Onset  . CAD Brother 3075  . Stroke Brother   . Cancer Other   . Heart disease Other   . Heart attack Neg Hx   . Hypertension Neg Hx     Social History Social History   Tobacco Use  . Smoking status: Former Smoker    Packs/day: 1.00    Years: 26.00    Pack years: 26.00    Types: Cigarettes    Quit date: 10/31/1973    Years since quitting: 45.8  . Smokeless tobacco: Never Used  Substance Use Topics  . Alcohol use: No  . Drug use: No     Allergies   Penicillins and Hydromorphone hcl   Review of Systems Review of Systems  Constitutional: Negative for fever.  Respiratory: Positive for shortness of breath. Negative for cough.   Cardiovascular: Negative for chest pain.  Gastrointestinal: Negative for vomiting.  Neurological: Positive for dizziness. Negative for weakness, numbness and headaches.  All other systems reviewed and are negative.    Physical Exam Updated Vital Signs BP (!) 157/90 (BP Location: Right Arm)   Pulse 66   Temp 97.8 F (36.6 C) (Oral)   Resp 16   Ht 5\' 9"  (1.753 m)   Wt 85.8 kg   SpO2 100%   BMI 27.93 kg/m   Physical Exam Vitals signs and nursing note reviewed.  Constitutional:      General: He is not in acute distress.    Appearance: He is well-developed. He is not ill-appearing or diaphoretic.  HENT:     Head: Normocephalic and atraumatic.     Right  Ear: External ear normal.     Left Ear: External ear normal.     Nose: Nose normal.  Eyes:     General:        Right  eye: No discharge.        Left eye: No discharge.     Extraocular Movements: Extraocular movements intact.     Pupils: Pupils are equal, round, and reactive to light.     Comments: Perhaps some slight nystagmus to the right, difficult to appreciate  Neck:     Musculoskeletal: Neck supple.  Cardiovascular:     Rate and Rhythm: Normal rate and regular rhythm.     Heart sounds: Normal heart sounds.  Pulmonary:     Effort: Pulmonary effort is normal.     Breath sounds: Normal breath sounds.  Abdominal:     Palpations: Abdomen is soft.     Tenderness: There is no abdominal tenderness.  Skin:    General: Skin is warm and dry.  Neurological:     Mental Status: He is alert.     Comments: CN 3-12 grossly intact. 5/5 strength in all 4 extremities. Grossly normal sensation. Normal finger to nose. Can stand unassisted and take a couple steps. Feels dizzy at first but improves.   Psychiatric:        Mood and Affect: Mood is not anxious.      ED Treatments / Results  Labs (all labs ordered are listed, but only abnormal results are displayed) Labs Reviewed  PROTIME-INR - Abnormal; Notable for the following components:      Result Value   Prothrombin Time 24.7 (*)    INR 2.3 (*)    All other components within normal limits  APTT - Abnormal; Notable for the following components:   aPTT 46 (*)    All other components within normal limits  DIFFERENTIAL - Abnormal; Notable for the following components:   Lymphs Abs 0.6 (*)    All other components within normal limits  COMPREHENSIVE METABOLIC PANEL - Abnormal; Notable for the following components:   Glucose, Bld 119 (*)    Creatinine, Ser 1.71 (*)    GFR calc non Af Amer 35 (*)    GFR calc Af Amer 41 (*)    All other components within normal limits  SARS CORONAVIRUS 2 (TAT 6-24 HRS)  CBC  CBG MONITORING, ED  TROPONIN  I (HIGH SENSITIVITY)    EKG EKG Interpretation  Date/Time:  Friday September 06 2019 14:25:21 EST Ventricular Rate:  62 PR Interval:  394 QRS Duration: 118 QT Interval:  466 QTC Calculation: 472 R Axis:   88 Text Interpretation: Atrial-paced rhythm with prolonged AV conduction Non-specific intra-ventricular conduction delay Nonspecific T wave abnormality Prolonged QT Abnormal ECG simila rto June 2020 Confirmed by Pricilla Loveless [phone]) on 09/06/2019 8:52:38 PM   Radiology Ct Head Wo Contrast  Result Date: 09/06/2019 CLINICAL DATA:  Dizziness EXAM: CT HEAD WITHOUT CONTRAST TECHNIQUE: Contiguous axial images were obtained from the base of the skull through the vertex without intravenous contrast. COMPARISON:  04/28/2019 FINDINGS: Brain: No acute territorial infarction, hemorrhage, or intracranial mass. Atrophy and small vessel ischemic changes of the white matter. Stable ventricle size. Vascular: No hyperdense vessels. Vertebral and carotid vascular calcification Skull: Normal. Negative for fracture or focal lesion. Sinuses/Orbits: No acute finding. Other: Postsurgical changes of the right mastoid IMPRESSION: 1. No CT evidence for acute intracranial abnormality. 2. Atrophy and small vessel ischemic changes of the white matter Electronically Signed   By: Jasmine Pang M.D.   On: 09/06/2019 17:16   Dg Chest Portable 1 View  Result Date: 09/06/2019 CLINICAL DATA:  Dyspnea EXAM: PORTABLE CHEST 1 VIEW COMPARISON:  04/28/2019, CT chest September 27, 2018 FINDINGS: Left-sided pacing device. Cardiomegaly. Tortuous aorta with atherosclerosis. Large retrocardiac opacity presumably related to hiatal hernia. A curvilinear opacity projects over the cardiac apex. IMPRESSION: 1. Cardiomegaly without edema 2. Large retrocardiac opacity presumably due to the history of hiatal hernia 3. Curvilinear calcification over the cardiac base, not seen on prior, questionable for calcified infarct or aneurysm. Electronically  Signed   By: Jasmine PangKim  Fujinaga M.D.   On: 09/06/2019 21:58    Procedures Procedures (including critical care time)  Medications Ordered in ED Medications  sodium chloride flush (NS) 0.9 % injection 3 mL (has no administration in time range)  acetaminophen (TYLENOL) tablet 650 mg (has no administration in time range)    Or  acetaminophen (TYLENOL) suppository 650 mg (has no administration in time range)  meclizine (ANTIVERT) tablet 12.5 mg (has no administration in time range)  amiodarone (PACERONE) tablet 200 mg (has no administration in time range)  atorvastatin (LIPITOR) tablet 10 mg (has no administration in time range)  tamsulosin (FLOMAX) capsule 0.4 mg (has no administration in time range)  rivaroxaban (XARELTO) tablet 20 mg (has no administration in time range)  sodium chloride 0.9 % bolus 500 mL (0 mLs Intravenous Stopping Infusion hung by another clincian 09/06/19 2324)  meclizine (ANTIVERT) tablet 12.5 mg (12.5 mg Oral Given 09/06/19 2137)     Initial Impression / Assessment and Plan / ED Course  I have reviewed the triage vital signs and the nursing notes.  Pertinent labs & imaging results that were available during my care of the patient were reviewed by me and considered in my medical decision making (see chart for details).        Given age and risk factors, there is some concern for CNS pathology such as cerebellar stroke.  However with his pacemaker that is not MRI capable I cannot get MRI.  I discussed with Dr. Otelia LimesLindzen, who feels it is less likely to be stroke and probably more dehydration.  Advised we could get a CT 3 days from now to see if there is developing infarct.  Otherwise, he is well outside the TPA window.  Probably since it is worse when he first stands up and has a bump in his creatinine this is probably dehydration.  I will admit for gentle fluids given his CHF.  Hospitalist to admit.  Final Clinical Impressions(s) / ED Diagnoses   Final diagnoses:   Dizziness  Acute kidney injury [facility](HCC)    ED Discharge Orders    None       Pricilla LovelessGoldston, Alishah Schulte, MD 09/06/19 2344

## 2019-09-06 NOTE — H&P (Signed)
History and Physical    ZEV DIZON YCX:[id number] DOB: 03/18/1929 DOA: 09/06/2019  PCP: Tally Joe, MD Patient coming from: Home  Chief Complaint: Dizziness  HPI: NAFTULI DHONDT is a 85 y.o. male with medical history significant of anemia, aortic stenosis, CAD status post PCI, cardiomyopathy, paroxysmal atrial fibrillation status post PPM, CKD stage II, hypertension, hyperlipidemia, OSA presenting to the hospital with complaints of dizziness.  Patient states this morning around 9 AM after eating breakfast he started feeling dizzy.  He describes it as a funny feeling in his head.  He then went to take his dog for a walk and while walking felt very unsteady.  He was using a cane but it felt like if he let go of the cane he would fall.  It felt like the road/ walking path was moving.  He was not leaning towards one side when walking and did not fall.  He then came back home and took a nap.  After waking up he was still not feeling right so he called his cardiologist's office.  States his cardiologist's office interrogated in his pacemaker and told him everything was okay.  Symptoms persisted and then he came into the ED earlier this afternoon to be evaluated.  States at present he is not feeling dizzy after receiving a medication in the ED.  Denies history of prior stroke.  Denies any focal weakness or numbness.  Denies fevers, chills, or recent illness.  States he was feeling very well yesterday and was physically active.  ED Course: Creatinine 1.7, baseline 1.1-1.2.  High-sensitivity troponin pending.  Chest x-ray pending. Per ED provider, gait did not appear ataxic when patient was walked in the ED. Case was discussed with Dr. Otelia Limes who felt this was less likely to be a cerebellar stroke.  Patient unable to get brain MRI as his pacemaker is not MRI compatible.  Neurology recommended repeating head CT in 72 hours if there remains concern for cerebellar stroke. Received mecilizine.    Review of  Systems:  All systems reviewed and apart from history of presenting illness, are negative.  Past Medical History:  Diagnosis Date  . Anemia, unspecified   . Aortic stenosis   . Arthritis    "right knee" (03/28/2013  . CAD in native artery    takes Xarelto daily  . Cardiomyopathy (HCC)    EF 40%:  ECHO 11/25/2015 EF 55%-60%  . Carpal tunnel syndrome, bilateral   . Chronic systolic CHF (congestive heart failure) (HCC)   . CKD (chronic kidney disease), stage II   . Epistaxis   . Essential hypertension, benign    takes Metoprolol daily  . H/O hiatal hernia   . History of kidney stones   . Hyperlipidemia    takes Lipitor daily  . Nonspecific abnormal unspecified cardiovascular function study   . OSA on CPAP    wears CPAP 02/03/16  . Pacemaker    MDT  . PAF (paroxysmal atrial fibrillation) (HCC)   . Paroxysmal ventricular tachycardia (HCC)   . PONV (postoperative nausea and vomiting)   . PVC's (premature ventricular contractions)   . Vitamin D deficiency     Past Surgical History:  Procedure Laterality Date  . CARDIAC CATHETERIZATION  03/21/2013  . CARDIOVERSION N/A 10/09/2018   Procedure: CARDIOVERSION;  Surgeon: Wendall Stade, MD;  Location: [facility];  Service: Cardiovascular;  Laterality: N/A;  . CARPAL TUNNEL RELEASE Right 02/05/2016   Procedure: RIGHT LIMITED OPEN CARPAL TUNNEL RELEASE;  Surgeon: Dominica Severin,  MD;  Location: [place];  Service: Orthopedics;  Laterality: Right;  . CARPAL TUNNEL RELEASE Left 04/21/2016   Procedure: CARPAL TUNNEL RELEASE;  Surgeon: Roseanne Kaufman, MD;  Location: [facility];  Service: Orthopedics;  Laterality: Left;  . CATARACT EXTRACTION W/ INTRAOCULAR LENS  IMPLANT, BILATERAL Bilateral 2000's  . COLONOSCOPY    . CORONARY ANGIOPLASTY WITH STENT PLACEMENT  03/28/2013   "3 stents" (03/28/2013)  . HIP ARTHROPLASTY Right 04/30/2019   Procedure: ARTHROPLASTY BIPOLAR HIP (HEMIARTHROPLASTY);  Surgeon: Paralee Cancel, MD;  Location: [facility];  Service:  Orthopedics;  Laterality: Right;  . INSERT / REPLACE / [place] Left 1972   "cut it open" (03/28/2013)  . KNEE CARTILAGE SURGERY Right ~ 1977   "cut it open" (03/28/2013)  . 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 PERCUTANEOUS CORONARY ROTOBLATOR INTERVENTION (PCI-R) N/A 03/28/2013   Procedure: PERCUTANEOUS CORONARY ROTOBLATOR INTERVENTION (PCI-R);  Surgeon: Jettie Booze, MD;  Location[facility];  Service: Cardiovascular;  Laterality: N/A;  . SEPTOPLASTY N/A 02/05/2016   Procedure: SEPTOPLASTY WITH INTRANASAL SKIN GRAFT;  Surgeon: Rozetta Nunnery, MD;  Location: [place];  Service: ENT;  Laterality: N/A;  . SHOULDER HEMI-ARTHROPLASTY Right 1987   "got hit by sled & dragged down sheet > 200 feet; tore shoulder up" (03/28/2013)  . SHOULDER SURGERY Right 1970's   "pulled muscle apart; sewed it back together" (03/28/2013  . TOTAL KNEE ARTHROPLASTY Right 2007  . TOTAL KNEE ARTHROPLASTY  11/26/2012   Procedure: TOTAL KNEE ARTHROPLASTY;  Surgeon: Gearlean Alf, MD;  Location: [facility];  Service: Orthopedics;  Laterality: Left;  . TRANSURETHRAL RESECTION OF PROSTATE N/A 12/27/2017   Procedure: TRANSURETHRAL RESECTION OF THE PROSTATE (TURP);  Surgeon: Lucas Mallow, MD;  Location: [facility];  Service: Urology;  Laterality: N/A;     reports that he quit smoking about 45 years ago. His smoking use included cigarettes. He has a 26.00 pack-year smoking history. He has never used smokeless tobacco. He reports that he does not drink alcohol or use drugs.  Allergies  Allergen Reactions  . Penicillins Hives    Has tolerated Keflex      Has patient had a PCN reaction causing immediate rash, facial/tongue/throat swelling, SOB or lightheadedness with hypotension: YES Has patient had a PCN reaction causing severe rash involving mucus membranes or skin necrosis: No Has patient had  a PCN reaction that required hospitalization No Has patient had a PCN reaction occurring within the last 10 years: No If all of the above answers are "NO", then may proceed with Cephalosporin use.   Marland Kitchen Hydromorphone Hcl Nausea And Vomiting    Family History  Problem Relation Age of Onset  . CAD Brother 70  . Stroke Brother   . Cancer Other   . Heart disease Other   . Heart attack Neg Hx   . Hypertension Neg Hx     Prior to Admission medications   Medication Sig Start Date End Date Taking? Authorizing Provider  amiodarone (PACERONE) 200 MG tablet Take 1 tablet (200 mg total) by mouth daily. 11/08/18   Daune Perch, NP  atorvastatin (LIPITOR) 10 MG tablet TAKE 1 TABLET BY MOUTH DAILY AT 6 PM. 01/01/19   Jettie Booze, MD  clindamycin (CLEOCIN) 150 MG capsule clindamycin HCl 150 mg capsule    [provider]  docusate sodium (COLACE)  100 MG capsule Take 1 capsule (100 mg total) by mouth 2 (two) times daily. 05/02/19   Rai, Ripudeep K, MD  ferrous sulfate 325 (65 FE) MG tablet Take 1 tablet (325 mg total) by mouth 3 (three) times daily after meals for 14 days. 05/01/19 05/15/19  Tommie Ard, PA-C  furosemide (LASIX) 20 MG tablet Take 2 tablets (40 mg) daily for 4 days and then take 1 tablet (20 mg) once a day 05/15/19   Dyann Kief, PA-C  HYDROcodone-acetaminophen (NORCO) 7.5-325 MG tablet Take 1-2 tablets by mouth every 4 (four) hours as needed for severe pain (pain score 7-10). Patient not taking: Reported on 05/22/2019 05/01/19   Tommie Ard, PA-C  isosorbide mononitrate (IMDUR) 30 MG 24 hr tablet isosorbide mononitrate ER 30 mg tablet,extended release 24 hr  TAKE 1 TABLET BY MOUTH EVERY DAY    [provider]  methocarbamol (ROBAXIN) 500 MG tablet Take 1 tablet (500 mg total) by mouth every 6 (six) hours as needed for muscle spasms. Patient not taking: Reported on 05/22/2019 05/01/19   Tommie Ard, PA-C  Metoprolol Succinate 25 MG CS24 Take 25 mg by  mouth daily. 03/22/19   Corky Crafts, MD  Multiple Vitamins-Minerals (PRESERVISION AREDS 2 PO) Take 1 capsule by mouth 2 (two) times daily.     [provider]  nitroGLYCERIN (NITROSTAT) 0.4 MG SL tablet Place 1 tablet (0.4 mg total) under the tongue every 5 (five) minutes as needed for chest pain. 11/17/17   Corky Crafts, MD  Omega-3 Fatty Acids (FISH OIL) 1000 MG CAPS Take 1,000 mg by mouth daily.    [provider]  polyethylene glycol (MIRALAX / GLYCOLAX) 17 g packet Take 17 g by mouth daily as needed for moderate constipation. 05/02/19   Rai, Ripudeep K, MD  sacubitril-valsartan (ENTRESTO) 24-26 MG Take 1 tablet by mouth 2 (two) times daily. 11/07/18   Berton Bon, NP  tamsulosin (FLOMAX) 0.4 MG CAPS capsule Take 1 capsule (0.4 mg total) by mouth daily. 05/03/19   Rai, Ripudeep K, MD  XARELTO 20 MG TABS tablet TAKE 1 TABLET (20 MG TOTAL) BY MOUTH DAILY WITH SUPPER. 04/29/19   Hillis Range, MD    Physical Exam: Vitals:   09/06/19 2100 09/06/19 2130 09/06/19 2159 09/06/19 2215  BP:      Pulse: 63 61 64 63  Resp: (!) 21 16 13 19   Temp:      TempSrc:      SpO2: 98% 99% 100% 97%  Weight:      Height:        Physical Exam  Constitutional: He is oriented to person, place, and time. He appears well-developed and well-nourished. No distress.  HENT:  Head: Normocephalic.  Eyes: Pupils are equal, round, and reactive to light. EOM are normal. Right eye exhibits no discharge. Left eye exhibits no discharge.  Neck: Neck supple.  Cardiovascular: Normal rate, regular rhythm and intact distal pulses.  Pulmonary/Chest: Effort normal and breath sounds normal. No respiratory distress. He has no wheezes. He has no rales.  Abdominal: Soft. Bowel sounds are normal. He exhibits no distension. There is no abdominal tenderness. There is no guarding.  Musculoskeletal:        General: No edema.  Neurological: He is alert and oriented to person, place, and time. No cranial  nerve deficit.  Speech fluent, tongue midline, no facial droop. Strength 5/5 in bilateral upper and lower extremities. Sensation to light touch intact throughout.  Skin:  Skin is warm and dry. He is not diaphoretic.     Labs on Admission: I have personally reviewed following labs and imaging studies  CBC: Recent Labs  Lab 09/06/19 1451  WBC 6.0  NEUTROABS 4.9  HGB 13.3  HCT 42.5  MCV 98.6  PLT 172   Basic Metabolic Panel: Recent Labs  Lab 09/06/19 1451  NA 140  K 4.6  CL 104  CO2 26  GLUCOSE 119*  BUN 18  CREATININE 1.71*  CALCIUM 8.9   GFR: Estimated Creatinine Clearance: 30.8 mL/min (A) (by C-G formula based on SCr of 1.71 mg/dL (H)). Liver Function Tests: Recent Labs  Lab 09/06/19 1451  AST 25  ALT 20  ALKPHOS 81  BILITOT 0.8  PROT 6.5  ALBUMIN 3.8   No results for input(s): LIPASE, AMYLASE in the last 168 hours. No results for input(s): AMMONIA in the last 168 hours. Coagulation Profile: Recent Labs  Lab 09/06/19 1451  INR 2.3*   Cardiac Enzymes: No results for input(s): CKTOTAL, CKMB, CKMBINDEX, TROPONINI in the last 168 hours. BNP (last 3 results) Recent Labs    05/15/19 1315  PROBNP 418   HbA1C: No results for input(s): HGBA1C in the last 72 hours. CBG: No results for input(s): GLUCAP in the last 168 hours. Lipid Profile: No results for input(s): CHOL, HDL, LDLCALC, TRIG, CHOLHDL, LDLDIRECT in the last 72 hours. Thyroid Function Tests: No results for input(s): TSH, T4TOTAL, FREET4, T3FREE, THYROIDAB in the last 72 hours. Anemia Panel: No results for input(s): VITAMINB12, FOLATE, FERRITIN, TIBC, IRON, RETICCTPCT in the last 72 hours. Urine analysis:    Component Value Date/Time   COLORURINE YELLOW 04/28/2019 2046   APPEARANCEUR CLEAR 04/28/2019 2046   LABSPEC 1.016 04/28/2019 2046   PHURINE 7.0 04/28/2019 2046   GLUCOSEU NEGATIVE 04/28/2019 2046   HGBUR NEGATIVE 04/28/2019 2046   BILIRUBINUR NEGATIVE 04/28/2019 2046   KETONESUR 5  (A) 04/28/2019 2046   PROTEINUR NEGATIVE 04/28/2019 2046   UROBILINOGEN 0.2 11/20/2012 1324   NITRITE NEGATIVE 04/28/2019 2046   LEUKOCYTESUR NEGATIVE 04/28/2019 2046    Radiological Exams on Admission: Ct Head Wo Contrast  Result Date: 09/06/2019 CLINICAL DATA:  Dizziness EXAM: CT HEAD WITHOUT CONTRAST TECHNIQUE: Contiguous axial images were obtained from the base of the skull through the vertex without intravenous contrast. COMPARISON:  04/28/2019 FINDINGS: Brain: No acute territorial infarction, hemorrhage, or intracranial mass. Atrophy and small vessel ischemic changes of the white matter. Stable ventricle size. Vascular: No hyperdense vessels. Vertebral and carotid vascular calcification Skull: Normal. Negative for fracture or focal lesion. Sinuses/Orbits: No acute finding. Other: Postsurgical changes of the right mastoid IMPRESSION: 1. No CT evidence for acute intracranial abnormality. 2. Atrophy and small vessel ischemic changes of the white matter Electronically Signed   By: Jasmine PangKim  Fujinaga M.D.   On: 09/06/2019 17:16   Dg Chest Portable 1 View  Result Date: 09/06/2019 CLINICAL DATA:  Dyspnea EXAM: PORTABLE CHEST 1 VIEW COMPARISON:  04/28/2019, CT chest September 27, 2018 FINDINGS: Left-sided pacing device. Cardiomegaly. Tortuous aorta with atherosclerosis. Large retrocardiac opacity presumably related to hiatal hernia. A curvilinear opacity projects over the cardiac apex. IMPRESSION: 1. Cardiomegaly without edema 2. Large retrocardiac opacity presumably due to the history of hiatal hernia 3. Curvilinear calcification over the cardiac base, not seen on prior, questionable for calcified infarct or aneurysm. Electronically Signed   By: Jasmine PangKim  Fujinaga M.D.   On: 09/06/2019 21:58    EKG: Independently reviewed.  Paced rhythm.  Assessment/Plan Principal Problem:  Dizziness Active Problems:   Hyperlipidemia   Atrial fibrillation (HCC)   OSA on CPAP   AKI (acute kidney injury) (HCC)    Dizziness Differentials include vertigo, BPPV, and orthostasis.  Cerebellar stroke is also on the differential although patient is not endorsing ataxia.  Per ED provider, gait did not appear ataxic when the patient was ambulated in the ED.  Case has been discussed with Dr. Otelia LimesLindzen who feels this is less likely to be a cerebellar stroke given lack of ataxia.  Patient unable to get brain MRI as his pacemaker is not MRI compatible.  Neurology recommended repeating head CT in 72 hours if there remains concern for cerebellar stroke.  -Continue meclizine 12.5 mg twice daily -PT evaluation for vestibular rehab -Check orthostatics -Hold home antihypertensives at this time  AKI on CKD stage II Creatinine 1.7, baseline 1.1-1.2.  Likely related to home diuretic and ARB use. -500 cc normal saline bolus ordered in ED. Avoid giving excessive fluid given systolic CHF with EF 30 to 35%. -Repeat BMP in a.m. -Monitor urine output -Hold diuretic and ARB at this time  OSA -CPAP at night  Paroxysmal atrial fibrillation Has a pacemaker. -Continue amiodarone and Xarelto  Hyperlipidemia -Continue Lipitor  BPH -Continue Flomax   Pharmacy med rec pending.  DVT prophylaxis: On chronic anticoagulation. Code Status: Full code.  Discussed with the patient. Family Communication: No family available. Disposition Plan: Anticipate discharge in 1 to 2 days. Admission status: It is my clinical opinion that referral for OBSERVATION is reasonable and necessary in this patient based on the above information provided. The aforementioned taken together are felt to place the patient at high risk for further clinical deterioration. However it is anticipated that the patient may be medically stable for discharge from the hospital within 24 to 48 hours.  The medical decision making on this patient was of high complexity and the patient is at high risk for clinical deterioration, therefore this is a level 3 visit.   John GiovanniVasundhra Jasmin Trumbull MD [facility] Pager [phone]  If 7PM-7AM, please contact night-coverage [email or web address] Password [email or web address]  09/06/2019, 10:38 PM

## 2019-09-07 DIAGNOSIS — R42 Dizziness and giddiness: Secondary | ICD-10-CM

## 2019-09-07 LAB — BASIC METABOLIC PANEL
Anion gap: 8 (ref 5–15)
BUN: 15 mg/dL (ref 8–23)
CO2: 26 mmol/L (ref 22–32)
Calcium: 8.7 mg/dL — ABNORMAL LOW (ref 8.9–10.3)
Chloride: 104 mmol/L (ref 98–111)
Creatinine, Ser: 1.39 mg/dL — ABNORMAL HIGH (ref 0.61–1.24)
GFR calc Af Amer: 52 mL/min — ABNORMAL LOW (ref >60)
GFR calc non Af Amer: 45 mL/min — ABNORMAL LOW (ref >60)
Glucose, Bld: 97 mg/dL (ref 70–99)
Potassium: 4.9 mmol/L (ref 3.5–5.1)
Sodium: 138 mmol/L (ref 135–145)

## 2019-09-07 LAB — SARS CORONAVIRUS 2 (TAT 6-24 HRS): SARS Coronavirus 2: NEGATIVE

## 2019-09-07 MED ORDER — RIVAROXABAN 15 MG PO TABS: 15.0000 mg | ORAL_TABLET | Freq: Every day | ORAL | Status: DC

## 2019-09-07 MED ORDER — MECLIZINE HCL 12.5 MG PO TABS: 12.5000 mg | ORAL_TABLET | Freq: Two times a day (BID) | ORAL | 0 refills | Status: DC | PRN

## 2019-09-07 NOTE — Discharge Instructions (Signed)

## 2019-09-07 NOTE — Evaluation (Signed)
Physical Therapy Evaluation Patient Details Name: Gregory Mccormick MRN: [id number] DOB: [birth year] Today's Date: 09/07/2019   History of Present Illness  Gregory Mccormick is a 85 y.o. male with medical history significant of anemia, aortic stenosis, CAD status post PCI, cardiomyopathy, paroxysmal atrial fibrillation status post PPM, CKD stage II, hypertension, hyperlipidemia, OSA presenting to the hospital with complaints of dizziness.  Clinical Impression  Pt admitted with above. Pt received meclizine and reports no dizziness at this time. Pt able to amb and transfer with supervision and no LOB. Pt reports feeling back to normal. Per his report upon admission suspect possible BPPV however unable to reproduce symptoms at this time, unsure if they're masked due to meclizine administration. Recommend out PT vestibular assessment off meclizine to evaluate and treat if positive for BPPV. Acute PT to cont to follow if pt remains in the hospital.    Follow Up Recommendations Outpatient PT(for vestibular assessment)    Equipment Recommendations  None recommended by PT    Recommendations for Other Services       Precautions / Restrictions Precautions Precautions: Fall Restrictions Weight Bearing Restrictions: No      Mobility  Bed Mobility Overal bed mobility: Modified Independent             General bed mobility comments: hob flat, used bed rail, no report of dizziness and no physical assist needed  Transfers Overall transfer level: Needs assistance Equipment used: None Transfers: Sit to/from Stand Sit to Stand: Min guard         General transfer comment: min guard for safety due to previous reports of dizziness  Ambulation/Gait Ambulation/Gait assistance: Min guard Gait Distance (Feet): 250 Feet Assistive device: None Gait Pattern/deviations: Wide base of support;Step-through pattern;Decreased stride length;Trunk flexed(pt bow legged) Gait velocity: wfl Gait velocity  interpretation: >2.62 ft/sec, indicative of community ambulatory General Gait Details: no episodes of LOB, no report of dizziness, pt able to complete head turns L/R and up/down without dizziness or LOB.   Stairs            Wheelchair Mobility    Modified Rankin (Stroke Patients Only)       Balance Overall balance assessment: Modified Independent                                           Pertinent Vitals/Pain Pain Assessment: No/denies pain    Home Living Family/patient expects to be discharged to:: Private residence Living Arrangements: Alone Available Help at Discharge: Family Type of Home: House Home Access: Stairs to enter Entrance Stairs-Rails: None Entrance Stairs-Number of Steps: 1 Home Layout: One level Home Equipment: Environmental consultant - 2 wheels;Cane - single point;Shower seat Additional Comments: 3 sons; one very close    Prior Function Level of Independence: Independent         Comments: wife died 05-31-23, hip replacement 02-May-2023     Hand Dominance   Dominant Hand: Right    Extremity/Trunk Assessment   Upper Extremity Assessment Upper Extremity Assessment: Overall WFL for tasks assessed    Lower Extremity Assessment Lower Extremity Assessment: Overall WFL for tasks assessed    Cervical / Trunk Assessment Cervical / Trunk Assessment: Kyphotic  Communication   Communication: HOH  Cognition Arousal/Alertness: Awake/alert Behavior During Therapy: WFL for tasks assessed/performed Overall Cognitive Status: Within Functional Limits for tasks assessed  General Comments: pt eager to go home as he "feels totally fine"      General Comments General comments (skin integrity, edema, etc.): VSS    Exercises     Assessment/Plan    PT Assessment Patient needs continued PT services  PT Problem List Decreased balance;Decreased mobility       PT Treatment Interventions DME instruction;Gait  training;Stair training;Functional mobility training;Therapeutic activities;Therapeutic exercise;Balance training;Neuromuscular re-education    PT Goals (Current goals can be found in the Care Plan section)  Acute Rehab PT Goals Patient Stated Goal: home ASAP PT Goal Formulation: With patient Time For Goal Achievement: 09/21/19 Potential to Achieve Goals: Good    Frequency Min 2X/week   Barriers to discharge Decreased caregiver support alone, supportive family    Co-evaluation               AM-PAC PT "6 Clicks" Mobility  Outcome Measure Help needed turning from your back to your side while in a flat bed without using bedrails?: None Help needed moving from lying on your back to sitting on the side of a flat bed without using bedrails?: None Help needed moving to and from a bed to a chair (including a wheelchair)?: None Help needed standing up from a chair using your arms (e.g., wheelchair or bedside chair)?: None Help needed to walk in hospital room?: None Help needed climbing 3-5 steps with a railing? : A Little 6 Click Score: 23    End of Session Equipment Utilized During Treatment: Gait belt Activity Tolerance: Patient tolerated treatment well Patient left: in bed;with call bell/phone within reach;with bed alarm set Nurse Communication: Mobility status PT Visit Diagnosis: Dizziness and giddiness (R42)    Time: 6834-1962 PT Time Calculation (min) (ACUTE ONLY): 19 min   Charges:   PT Evaluation $PT Eval Low Complexity: 1 Low          Lewis Shock, PT, DPT Acute Rehabilitation Services Pager #: [phone] Office #: [phone]   Iona Hansen 09/07/2019, 10:31 AM

## 2019-09-07 NOTE — Discharge Summary (Addendum)
Physician Discharge Summary  Gregory CamelJack D Nordell ZOX:[id number] DOB: [birth year] DOA: 09/06/2019  PCP: Tally JoeSwayne, David, MD  Admit date: 09/06/2019 Discharge date: 09/07/2019  Admitted From: Home Disposition:  Home  Recommendations for Outpatient Follow-up:  Follow up with PCP in 1 week Repeat BMP in 1 week to check on kidney function. Held lasix and entresto due to AKI at time of admission.  Outpatient PT referral placed at time of discharge   Discharge Condition: Stable CODE STATUS: Full  Diet recommendation: Heart healthy   Brief/Interim Summary: From H&P by Dr. Loney Lohathore: "Gregory Mccormick is a 85 y.o. male with medical history significant of anemia, aortic stenosis, CAD status post PCI, cardiomyopathy, paroxysmal atrial fibrillation status post PPM, CKD stage II, hypertension, hyperlipidemia, OSA presenting to the hospital with complaints of dizziness.  Patient states this morning around 9 AM after eating breakfast he started feeling dizzy.  He describes it as a funny feeling in his head.  He then went to take his dog for a walk and while walking felt very unsteady.  He was using a cane but it felt like if he let go of the cane he would fall.  It felt like the road/walking path was moving.  He was not leaning towards one side when walking and did not fall.  He then came back home and took a nap.  After waking up he was still not feeling right so he called his cardiologist's office.  States his cardiologist's office interrogated in his pacemaker and told him everything was okay.  Symptoms persisted and then he came into the ED earlier this afternoon to be evaluated.  States at present he is not feeling dizzy after receiving a medication in the ED.  Denies history of prior stroke.  Denies any focal weakness or numbness.  Denies fevers, chills, or recent illness.  States he was feeling very well yesterday and was physically active."   "ED course: "Creatinine 1.7, baseline 1.1-1.2.  High-sensitivity troponin  pending.  Chest x-ray pending. Per ED provider, gait did not appear ataxic when patient was walked in the ED. Case was discussed with Dr. Otelia LimesLindzen who felt this was less likely to be a cerebellar stroke.  Patient unable to get brain MRI as his pacemaker is not MRI compatible.  Neurology recommended repeating head CT in 72 hours if there remains concern for cerebellar stroke. Received mecilizine."  This morning, patient is sitting in bed, eating breakfast. States that he feels back to his normal self and has not had further nausea since being in the ED. He wants to be discharged home today. Cr improved to 1.39.   Discharge Diagnoses:  Principal Problem:   Dizziness Active Problems:   Hyperlipidemia   Atrial fibrillation (HCC)   OSA on CPAP   AKI (acute kidney injury) (HCC)   Dizziness Differentials include vertigo, BPPV, and orthostasis.  Cerebellar stroke is also on the differential although patient is not endorsing ataxia.  Per ED provider, gait did not appear ataxic when the patient was ambulated in the ED.  Case has been discussed with Dr. Otelia LimesLindzen who feels this is less likely to be a cerebellar stroke given lack of ataxia.  Patient unable to get brain MRI as his pacemaker is not MRI compatible.  Neurology recommended repeating head CT in 72 hours if there remains concern for cerebellar stroke.  -Continue meclizine 12.5 mg twice daily -PT evaluation for vestibular rehab -Resolved and feeling back to baseline   AKI on CKD stage II  Creatinine 1.7, baseline 1.1-1.2.  Likely related to home diuretic and ARB use. -500 cc normal saline bolus ordered in ED. Avoid giving excessive fluid given systolic CHF with EF 30 to 35%. -Improved  OSA -CPAP at night  Paroxysmal atrial fibrillation Has a pacemaker -Continue amiodarone and Xarelto  Hyperlipidemia -Continue Lipitor  BPH -Continue Flomax   Discharge Instructions  Discharge Instructions    (HEART FAILURE PATIENTS) Call MD:   Anytime you have any of the following symptoms: 1) 3 pound weight gain in 24 hours or 5 pounds in 1 week 2) shortness of breath, with or without a dry hacking cough 3) swelling in the hands, feet or stomach 4) if you have to sleep on extra pillows at night in order to breathe.   Complete by: As directed    Ambulatory referral to Physical Therapy   Complete by: As directed    For vestibular PT   Call MD for:  difficulty breathing, headache or visual disturbances   Complete by: As directed    Call MD for:  extreme fatigue   Complete by: As directed    Call MD for:  persistant dizziness or light-headedness   Complete by: As directed    Call MD for:  persistant nausea and vomiting   Complete by: As directed    Call MD for:  severe uncontrolled pain   Complete by: As directed    Call MD for:  temperature >100.4   Complete by: As directed    Diet - low sodium heart healthy   Complete by: As directed    Discharge instructions   Complete by: As directed    You were cared for by a hospitalist during your hospital stay. If you have any questions about your discharge medications or the care you received while you were in the hospital after you are discharged, you can call the unit and ask to speak with the hospitalist on call if the hospitalist that took care of you is not available. Once you are discharged, your primary care physician will handle any further medical issues. Please note that NO REFILLS for any discharge medications will be authorized once you are discharged, as it is imperative that you return to your primary care physician (or establish a relationship with a primary care physician if you do not have one) for your aftercare needs so that they can reassess your need for medications and monitor your lab values.   Increase activity slowly   Complete by: As directed      Allergies as of 09/07/2019      Reactions   Penicillins Hives   Has tolerated Keflex      Has patient had a PCN  reaction causing immediate rash, facial/tongue/throat swelling, SOB or lightheadedness with hypotension: YES Has patient had a PCN reaction causing severe rash involving mucus membranes or skin necrosis: No Has patient had a PCN reaction that required hospitalization No Has patient had a PCN reaction occurring within the last 10 years: No If all of the above answers are "NO", then may proceed with Cephalosporin use.   Hydromorphone Hcl Nausea And Vomiting      Medication List    STOP taking these medications   docusate sodium 100 MG capsule Commonly known as: COLACE   ferrous sulfate 325 (65 FE) MG tablet   furosemide 20 MG tablet Commonly known as: LASIX   HYDROcodone-acetaminophen 7.5-325 MG tablet Commonly known as: NORCO   methocarbamol 500 MG tablet Commonly known as:  ROBAXIN   polyethylene glycol 17 g packet Commonly known as: MIRALAX / GLYCOLAX   sacubitril-valsartan 24-26 MG Commonly known as: Entresto     TAKE these medications   amiodarone 200 MG tablet Commonly known as: PACERONE Take 1 tablet (200 mg total) by mouth daily.   atorvastatin 10 MG tablet Commonly known as: LIPITOR TAKE 1 TABLET BY MOUTH DAILY AT 6 PM. What changed: See the new instructions.   Fish Oil 1000 MG Caps Take 1,000 mg by mouth daily.   isosorbide mononitrate 30 MG 24 hr tablet Commonly known as: IMDUR Take 30 mg by mouth daily.   meclizine 12.5 MG tablet Commonly known as: ANTIVERT Take 1 tablet (12.5 mg total) by mouth 2 (two) times daily as needed for dizziness.   Metoprolol Succinate 25 MG Cs24 Take 25 mg by mouth daily.   nitroGLYCERIN 0.4 MG SL tablet Commonly known as: NITROSTAT Place 1 tablet (0.4 mg total) under the tongue every 5 (five) minutes as needed for chest pain.   PRESERVISION AREDS 2 PO Take 1 capsule by mouth 2 (two) times daily.   tamsulosin 0.4 MG Caps capsule Commonly known as: FLOMAX Take 1 capsule (0.4 mg total) by mouth daily.   Xarelto 20  MG Tabs tablet Generic drug: rivaroxaban TAKE 1 TABLET (20 MG TOTAL) BY MOUTH DAILY WITH SUPPER. What changed: See the new instructions.      Follow-up Information    Tally Joe, MD. Schedule an appointment as soon as possible for a visit in 1 week(s).   Specialty: Family Medicine Why: Hold off on taking lasix and entresto until follow up with PCP and repeat blood work to check kidney function. Resume these medications at PCP's instruction.  Contact information: [address] [place] [address] [phone]        [facility] Follow [facility].   Specialty: Rehabilitation Why: For outpatient VESTIBULAR PT. They will call you in the next week to set up your first appointment.  Contact information: [address] [id number] mc [place] [address] [phone]         Allergies  Allergen Reactions  . Penicillins Hives    Has tolerated Keflex      Has patient had a PCN reaction causing immediate rash, facial/tongue/throat swelling, SOB or lightheadedness with hypotension: YES Has patient had a PCN reaction causing severe rash involving mucus membranes or skin necrosis: No Has patient had a PCN reaction that required hospitalization No Has patient had a PCN reaction occurring within the last 10 years: No If all of the above answers are "NO", then may proceed with Cephalosporin use.   Marland Kitchen Hydromorphone Hcl Nausea And Vomiting    Consultations:  None   Procedures/Studies: Ct Head Wo Contrast  Result Date: 09/06/2019 CLINICAL DATA:  Dizziness EXAM: CT HEAD WITHOUT CONTRAST TECHNIQUE: Contiguous axial images were obtained from the base of the skull through the vertex without intravenous contrast. COMPARISON:  04/28/2019 FINDINGS: Brain: No acute territorial infarction, hemorrhage, or intracranial mass. Atrophy and small vessel ischemic changes of the white matter. Stable ventricle size. Vascular: No  hyperdense vessels. Vertebral and carotid vascular calcification Skull: Normal. Negative for fracture or focal lesion. Sinuses/Orbits: No acute finding. Other: Postsurgical changes of the right mastoid IMPRESSION: 1. No CT evidence for acute intracranial abnormality. 2. Atrophy and small vessel ischemic changes of the white matter Electronically Signed   By: Jasmine Pang M.D.   On: 09/06/2019 17:16   Dg Chest  Portable 1 View  Result Date: 09/06/2019 CLINICAL DATA:  Dyspnea EXAM: PORTABLE CHEST 1 VIEW COMPARISON:  04/28/2019, CT chest September 27, 2018 FINDINGS: Left-sided pacing device. Cardiomegaly. Tortuous aorta with atherosclerosis. Large retrocardiac opacity presumably related to hiatal hernia. A curvilinear opacity projects over the cardiac apex. IMPRESSION: 1. Cardiomegaly without edema 2. Large retrocardiac opacity presumably due to the history of hiatal hernia 3. Curvilinear calcification over the cardiac base, not seen on prior, questionable for calcified infarct or aneurysm. Electronically Signed   By: Jasmine PangKim  Fujinaga M.D.   On: 09/06/2019 21:58       Discharge Exam: Vitals:   09/07/19 0426 09/07/19 0833  BP: 128/69 120/80  Pulse: 61   Resp: 18   Temp: 97.7 F (36.5 C)   SpO2: 97%      General: Pt is alert, awake, not in acute distress Cardiovascular: RRR, S1/S2 +, no edema Respiratory: CTA bilaterally, no wheezing, no rhonchi, no respiratory distress, no conversational dyspnea  Abdominal: Soft, NT, ND, bowel sounds + Extremities: no edema, no cyanosis Psych: Normal mood and affect, stable judgement and insight     The results of significant diagnostics from this hospitalization (including imaging, microbiology, ancillary and laboratory) are listed below for reference.     Microbiology: Recent Results (from the past 240 hour(s))  SARS CORONAVIRUS 2 (TAT 6-24 HRS) Nasopharyngeal Nasopharyngeal Swab     Status: None   Collection Time: 09/06/19 10:33 PM   Specimen:  Nasopharyngeal Swab  Result Value Ref Range Status   SARS Coronavirus 2 NEGATIVE NEGATIVE Final    Comment: (NOTE) SARS-CoV-2 target nucleic acids are NOT DETECTED. The SARS-CoV-2 RNA is generally detectable in upper and lower respiratory specimens during the acute phase of infection. Negative results do not preclude SARS-CoV-2 infection, do not rule out co-infections with other pathogens, and should not be used as the sole basis for treatment or other patient management decisions. Negative results must be combined with clinical observations, patient history, and epidemiological information. The expected result is Negative. Fact Sheet for Patients: HairSlick.nohttps://www.fda.gov/media/138098/download Fact Sheet for Healthcare Providers: quierodirigir.comhttps://www.fda.gov/media/138095/download This test is not yet approved or cleared by the [place] FDA and  has been authorized for detection and/or diagnosis of SARS-CoV-2 by FDA under an Emergency Use Authorization (EUA). This EUA will remain  in effect (meaning this test can be used) for the duration of the COVID-19 declaration under Section 56 4(b)(1) of the Act, 21 U.S.C. section 360bbb-3(b)(1), unless the authorization is terminated or revoked sooner. Performed at [facility], [address] [place], [place] [id number]      Labs: BNP (last 3 results) Recent Labs    09/27/18 2020  BNP 539.4*   Basic Metabolic Panel: Recent Labs  Lab 09/06/19 1451 09/07/19 0828  NA 140 138  K 4.6 4.9  CL 104 104  CO2 26 26  GLUCOSE 119* 97  BUN 18 15  CREATININE 1.71* 1.39*  CALCIUM 8.9 8.7*   Liver Function Tests: Recent Labs  Lab 09/06/19 1451  AST 25  ALT 20  ALKPHOS 81  BILITOT 0.8  PROT 6.5  ALBUMIN 3.8   No results for input(s): LIPASE, AMYLASE in the last 168 hours. No results for input(s): AMMONIA in the last 168 hours. CBC: Recent Labs  Lab 09/06/19 1451  WBC 6.0  NEUTROABS 4.9  HGB 13.3  HCT 42.5  MCV 98.6  PLT  172   Cardiac Enzymes: No results for input(s): CKTOTAL, CKMB, CKMBINDEX, TROPONINI in the last 168  hours. BNP: Invalid input(s): POCBNP CBG: No results for input(s): GLUCAP in the last 168 hours. D-Dimer No results for input(s): DDIMER in the last 72 hours. Hgb A1c No results for input(s): HGBA1C in the last 72 hours. Lipid Profile No results for input(s): CHOL, HDL, LDLCALC, TRIG, CHOLHDL, LDLDIRECT in the last 72 hours. Thyroid function studies No results for input(s): TSH, T4TOTAL, T3FREE, THYROIDAB in the last 72 hours.  Invalid input(s): FREET3 Anemia work up No results for input(s): VITAMINB12, FOLATE, FERRITIN, TIBC, IRON, RETICCTPCT in the last 72 hours. Urinalysis    Component Value Date/Time   COLORURINE YELLOW 04/28/2019 2046   APPEARANCEUR CLEAR 04/28/2019 2046   LABSPEC 1.016 04/28/2019 2046   PHURINE 7.0 04/28/2019 2046   GLUCOSEU NEGATIVE 04/28/2019 2046   HGBUR NEGATIVE 04/28/2019 2046   BILIRUBINUR NEGATIVE 04/28/2019 2046   KETONESUR 5 (A) 04/28/2019 2046   PROTEINUR NEGATIVE 04/28/2019 2046   UROBILINOGEN 0.2 11/20/2012 1324   NITRITE NEGATIVE 04/28/2019 2046   LEUKOCYTESUR NEGATIVE 04/28/2019 2046   Sepsis Labs Invalid input(s): PROCALCITONIN,  WBC,  LACTICIDVEN Microbiology Recent Results (from the past 240 hour(s))  SARS CORONAVIRUS 2 (TAT 6-24 HRS) Nasopharyngeal Nasopharyngeal Swab     Status: None   Collection Time: 09/06/19 10:33 PM   Specimen: Nasopharyngeal Swab  Result Value Ref Range Status   SARS Coronavirus 2 NEGATIVE NEGATIVE Final    Comment: (NOTE) SARS-CoV-2 target nucleic acids are NOT DETECTED. The SARS-CoV-2 RNA is generally detectable in upper and lower respiratory specimens during the acute phase of infection. Negative results do not preclude SARS-CoV-2 infection, do not rule out co-infections with other pathogens, and should not be used as the sole basis for treatment or other patient management decisions. Negative results  must be combined with clinical observations, patient history, and epidemiological information. The expected result is Negative. Fact Sheet for Patients: HairSlick.no Fact Sheet for Healthcare Providers: quierodirigir.com This test is not yet approved or cleared by the [place] FDA and  has been authorized for detection and/or diagnosis of SARS-CoV-2 by FDA under an Emergency Use Authorization (EUA). This EUA will remain  in effect (meaning this test can be used) for the duration of the COVID-19 declaration under Section 56 4(b)(1) of the Act, 21 U.S.C. section 360bbb-3(b)(1), unless the authorization is terminated or revoked sooner. Performed at [facility], [address], [place], [place] [address]      Patient was seen and examined on the day of discharge and was found to be in stable condition. Time coordinating discharge: 35 minutes including assessment and coordination of care, as well as examination of the patient.   SIGNED:  Noralee Stain, DO [facility] 09/07/2019, 12:05 PM

## 2019-09-23 ENCOUNTER — Ambulatory Visit (HOSPITAL_COMMUNITY): Payer: Medicare Other | Attending: Cardiovascular Disease

## 2019-09-23 ENCOUNTER — Other Ambulatory Visit: Payer: Self-pay

## 2019-09-23 DIAGNOSIS — I350 Nonrheumatic aortic (valve) stenosis: Secondary | ICD-10-CM | POA: Diagnosis not present

## 2019-09-25 ENCOUNTER — Ambulatory Visit (INDEPENDENT_AMBULATORY_CARE_PROVIDER_SITE_OTHER): Payer: Medicare Other | Admitting: *Deleted

## 2019-09-25 DIAGNOSIS — I441 Atrioventricular block, second degree: Secondary | ICD-10-CM

## 2019-09-26 LAB — CUP PACEART REMOTE DEVICE CHECK
Battery Impedance: 1209 Ohm
Battery Remaining Longevity: 54 mo
Battery Voltage: 2.76 V
Brady Statistic AP VP Percent: 2 %
Brady Statistic AP VS Percent: 83 %
Brady Statistic AS VP Percent: 2 %
Brady Statistic AS VS Percent: 14 %
Date Time Interrogation Session: 20201125141355
Implantable Lead Implant Date: 20111206
Implantable Lead Implant Date: 20111206
Implantable Lead Location: 753859
Implantable Lead Location: 753860
Implantable Lead Model: 5076
Implantable Lead Model: 5092
Implantable Pulse Generator Implant Date: 20111206
Lead Channel Impedance Value: 449 Ohm
Lead Channel Impedance Value: 737 Ohm
Lead Channel Pacing Threshold Amplitude: 0.75 V
Lead Channel Pacing Threshold Amplitude: 1 V
Lead Channel Pacing Threshold Pulse Width: 0.4 ms
Lead Channel Pacing Threshold Pulse Width: 0.4 ms
Lead Channel Setting Pacing Amplitude: 2 V
Lead Channel Setting Pacing Amplitude: 2.5 V
Lead Channel Setting Pacing Pulse Width: 0.4 ms
Lead Channel Setting Sensing Sensitivity: 5.6 mV

## 2019-10-02 ENCOUNTER — Other Ambulatory Visit: Payer: Self-pay | Admitting: Cardiology

## 2019-10-02 ENCOUNTER — Other Ambulatory Visit: Payer: Self-pay | Admitting: Interventional Cardiology

## 2019-11-14 ENCOUNTER — Encounter: Payer: Self-pay | Admitting: Interventional Cardiology

## 2019-11-14 ENCOUNTER — Other Ambulatory Visit: Payer: Self-pay | Admitting: Interventional Cardiology

## 2019-11-14 ENCOUNTER — Ambulatory Visit (INDEPENDENT_AMBULATORY_CARE_PROVIDER_SITE_OTHER): Payer: Medicare Other | Admitting: Interventional Cardiology

## 2019-11-14 ENCOUNTER — Other Ambulatory Visit: Payer: Self-pay

## 2019-11-14 VITALS — BP 100/62 | HR 81 | Ht 69.0 in | Wt 196.4 lb

## 2019-11-14 DIAGNOSIS — Z950 Presence of cardiac pacemaker: Secondary | ICD-10-CM

## 2019-11-14 DIAGNOSIS — I4821 Permanent atrial fibrillation: Secondary | ICD-10-CM

## 2019-11-14 DIAGNOSIS — I350 Nonrheumatic aortic (valve) stenosis: Secondary | ICD-10-CM | POA: Diagnosis not present

## 2019-11-14 DIAGNOSIS — I2510 Atherosclerotic heart disease of native coronary artery without angina pectoris: Secondary | ICD-10-CM | POA: Diagnosis not present

## 2019-11-14 DIAGNOSIS — I519 Heart disease, unspecified: Secondary | ICD-10-CM | POA: Diagnosis not present

## 2019-11-14 NOTE — Patient Instructions (Signed)
Medication Instructions:  Your physician recommends that you continue on your current medications as directed. Please refer to the Current Medication list given to you today.  *If you need a refill on your cardiac medications before your next appointment, please call your pharmacy*  Lab Work: None ordered  If you have labs (blood work) drawn today and your tests are completely normal, you will receive your results only by: . MyChart Message (if you have MyChart) OR . A paper copy in the mail If you have any lab test that is abnormal or we need to change your treatment, we will call you to review the results.  Testing/Procedures: None ordered  Follow-Up: At [facility], you and your health needs are our priority.  As part of our continuing mission to provide you with exceptional heart care, we have created designated Provider Care Teams.  These Care Teams include your primary Cardiologist (physician) and Advanced Practice Providers (APPs -  Physician Assistants and Nurse Practitioners) who all work together to provide you with the care you need, when you need it.  Your next appointment:   6 month(s)  The format for your next appointment:   In Person  Provider:   You may see Jayadeep Varanasi, MD or one of the following Advanced Practice Providers on your designated Care Team:    Dayna Dunn, PA-C  Michele Lenze, PA-C   Other Instructions   

## 2019-11-14 NOTE — Progress Notes (Signed)
Cardiology Office Note   Date:  11/14/2019   ID:  Gregory Mccormick, DOB 02/24/1929, MRN [id number]  PCP:  Tally Joe, MD    No chief complaint on file.  CAD  Wt Readings from Last 3 Encounters:  11/14/19 196 lb 6.4 oz (89.1 kg)  09/06/19 189 lb 1.6 oz (85.8 kg)  05/22/19 184 lb (83.5 kg)       History of Present Illness: Gregory Mccormick is a 86 y.o. 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  He had remote PCI in 2014 and previously had normal LV function.   He was hospitalized in 08/2018 with atypical chest pain. Troponins were flat and CTA was negative for PE. However, his EF was noted to be down - EF 30-35%, diffuse HK, moderate AS, mild MR, mod LAE, severely dilated RA, mildly dilated RV, mild TR, PASP 46, elevated CVP.  He Was hospitalized and underwent right hip Hemiarthroplasty 04/30/2019 without complications. Patient had no trouble with heart failure or chest pain while in the hospital.  I saw patient 05/15/2019 with worsening leg edema right greater than left.  Edema resolved with diuresis.  Since the last visit, he has felt well.  Denies : Chest pain. Dizziness. Leg edema. Nitroglycerin use. Orthopnea. Palpitations. Paroxysmal nocturnal dyspnea. Shortness of breath. Syncope.   He goes up stairs without any problems.  No falls since the broken hip in 04/2019.    His wife [name] passed away in 06/06/2019.    Past Medical History:  Diagnosis Date  . Anemia, unspecified   . Aortic stenosis   . Arthritis    "right knee" (03/28/2013  . CAD in native artery    takes Xarelto daily  . Cardiomyopathy (HCC)    EF 40%:  ECHO 11/25/2015 EF 55%-60%  . Carpal tunnel syndrome, bilateral   . Chronic systolic CHF  (congestive heart failure) (HCC)   . CKD (chronic kidney disease), stage II   . Epistaxis   . Essential hypertension, benign    takes Metoprolol daily  . H/O hiatal hernia   . History of kidney stones   . Hyperlipidemia    takes Lipitor daily  . Nonspecific abnormal unspecified cardiovascular function study   . OSA on CPAP    wears CPAP 02/03/16  . Pacemaker    MDT  . PAF (paroxysmal atrial fibrillation) (HCC)   . Paroxysmal ventricular tachycardia (HCC)   . PONV (postoperative nausea and vomiting)   . PVC's (premature ventricular contractions)   . Vitamin D deficiency     Past Surgical History:  Procedure Laterality Date  . CARDIAC CATHETERIZATION  03/21/2013  . CARDIOVERSION N/A 10/09/2018   Procedure: CARDIOVERSION;  Surgeon: Wendall Stade, MD;  Location: [facility] ENDOSCOPY;  Service: Cardiovascular;  Laterality: N/A;  . CARPAL TUNNEL RELEASE Right 02/05/2016   Procedure: RIGHT LIMITED OPEN CARPAL TUNNEL RELEASE;  Surgeon: Dominica Severin, MD;  Location: [facility] OR;  Service: Orthopedics;  Laterality: Right;  . CARPAL TUNNEL RELEASE Left 04/21/2016   Procedure: CARPAL TUNNEL RELEASE;  Surgeon: Dominica Severin, MD;  Location: [facility] OR;  Service: Orthopedics;  Laterality: Left;  . CATARACT EXTRACTION W/ INTRAOCULAR LENS  IMPLANT, BILATERAL Bilateral 2000's  . COLONOSCOPY    . CORONARY ANGIOPLASTY WITH STENT PLACEMENT  03/28/2013   "  3 stents" (03/28/2013)  . HIP ARTHROPLASTY Right 04/30/2019   Procedure: ARTHROPLASTY BIPOLAR HIP (HEMIARTHROPLASTY);  Surgeon: Paralee Cancel, MD;  Location: [facility] ORS;  Service: Orthopedics;  Laterality: Right;  . INSERT / REPLACE / [place] Left 1972   "cut it open" (03/28/2013)  . KNEE CARTILAGE SURGERY Right ~ 1977   "cut it open" (03/28/2013)  . 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 PERCUTANEOUS CORONARY ROTOBLATOR INTERVENTION (PCI-R) N/A 03/28/2013   Procedure:  PERCUTANEOUS CORONARY ROTOBLATOR INTERVENTION (PCI-R);  Surgeon: Jettie Booze, MD;  Location: [facility] CATH LAB;  Service: Cardiovascular;  Laterality: N/A;  . SEPTOPLASTY N/A 02/05/2016   Procedure: SEPTOPLASTY WITH INTRANASAL SKIN GRAFT;  Surgeon: Rozetta Nunnery, MD;  Location: [place];  Service: ENT;  Laterality: N/A;  . SHOULDER HEMI-ARTHROPLASTY Right 1987   "got hit by sled & dragged down sheet > 200 feet; tore shoulder up" (03/28/2013)  . SHOULDER SURGERY Right 1970's   "pulled muscle apart; sewed it back together" (03/28/2013  . TOTAL KNEE ARTHROPLASTY Right 2007  . TOTAL KNEE ARTHROPLASTY  11/26/2012   Procedure: TOTAL KNEE ARTHROPLASTY;  Surgeon: Gearlean Alf, MD;  Location: [facility] ORS;  Service: Orthopedics;  Laterality: Left;  . TRANSURETHRAL RESECTION OF PROSTATE N/A 12/27/2017   Procedure: TRANSURETHRAL RESECTION OF THE PROSTATE (TURP);  Surgeon: Lucas Mallow, MD;  Location: [facility];  Service: Urology;  Laterality: N/A;     Current Outpatient Medications  Medication Sig Dispense Refill  . amiodarone (PACERONE) 200 MG tablet Take 1 tablet (200 mg total) by mouth daily. 90 tablet 2  . atorvastatin (LIPITOR) 10 MG tablet TAKE 1 TABLET BY MOUTH DAILY AT 6 PM. 90 tablet 1  . furosemide (LASIX) 20 MG tablet Take 20 mg by mouth daily.    . Metoprolol Succinate 25 MG CS24 Take 25 mg by mouth daily. 90 capsule 3  . nitroGLYCERIN (NITROSTAT) 0.4 MG SL tablet Place 1 tablet (0.4 mg total) under the tongue every 5 (five) minutes as needed for chest pain. 25 tablet 5  . sacubitril-valsartan (ENTRESTO) 24-26 MG Take 1 tablet by mouth 2 (two) times daily.    Alveda Reasons 20 MG TABS tablet TAKE 1 TABLET (20 MG TOTAL) BY MOUTH DAILY WITH SUPPER. 90 tablet 1   No current facility-administered medications for this visit.    Allergies:   Penicillins and Hydromorphone hcl    Social History:  The patient  reports that he quit smoking about 46 years ago. His smoking use  included cigarettes. He has a 26.00 pack-year smoking history. He has never used smokeless tobacco. He reports that he does not drink alcohol or use drugs.   Family History:  The patient's family history includes CAD (age of onset: 54) in his brother; Cancer in an other family member; Heart disease in an other family member; Stroke in his brother.    ROS:  Please see the history of present illness.   Otherwise, review of systems are positive for broken hip in June 2020..   All other systems are reviewed and negative.    PHYSICAL EXAM: VS:  BP 100/62   Pulse 81   Ht 5\' 9"  (1.753 m)   Wt 196 lb 6.4 oz (89.1 kg)   SpO2 97%   BMI 29.00 kg/m  , BMI Body mass index is 29 kg/m. GEN: Well nourished,  well developed, in no acute distress  HEENT: normal  Neck: no JVD, carotid bruits, or masses Cardiac: RRR; no murmurs, rubs, or gallops,no edema  Respiratory:  clear to auscultation bilaterally, normal work of breathing GI: soft, nontender, nondistended, + BS MS: no deformity or atrophy  Skin: warm and dry, no rash Neuro:  Strength and sensation are intact Psych: euthymic mood, full affect   EKG:   The ekg ordered in Nov 2020 demonstrates atrial pacing, narrow QRS complex, no ST changes   Recent Labs: 11/20/2018: Magnesium 2.2 04/29/2019: TSH 3.504 05/15/2019: NT-Pro BNP 418 09/06/2019: ALT 20; Hemoglobin 13.3; Platelets 172 09/07/2019: BUN 15; Creatinine, Ser 1.39; Potassium 4.9; Sodium 138   Lipid Panel    Component Value Date/Time   CHOL 108 04/30/2019 0518   CHOL 108 11/17/2017 0848   TRIG 29 04/30/2019 0518   HDL 52 04/30/2019 0518   HDL 56 11/17/2017 0848   CHOLHDL 2.1 04/30/2019 0518   VLDL 6 04/30/2019 0518   LDLCALC 50 04/30/2019 0518   LDLCALC 39 11/17/2017 0848     Other studies Reviewed: Additional studies/ records that were reviewed today with results demonstrating: echo results reviewed.   ASSESSMENT AND PLAN:  1. CAD: No angina.  Continue aggressive secondary  prevention. 2. Aortic stenosis: Moderate by echo in 09/2019.  3. Pacemaker: Continue with routine f/u. 4. Chronic systolic heart failure: Appears euvolemic.   5. Atrial fibrillation: Xarelto for stroke prevention. Amio to maintain NSR.     Current medicines are reviewed at length with the patient today.  The patient concerns regarding his medicines were addressed.  The following changes have been made:  No change  Labs/ tests ordered today include:  No orders of the defined types were placed in this encounter.   Recommend 150 minutes/week of aerobic exercise Low fat, low carb, high fiber diet recommended  Disposition:   FU in 6 months   Signed, Lance Muss, MD  11/14/2019 2:54 PM    [facility] [address] [place], [place], [place]  [address] Phone: [phone]; Fax: [phone]

## 2019-12-01 ENCOUNTER — Other Ambulatory Visit: Payer: Self-pay | Admitting: Cardiology

## 2019-12-20 ENCOUNTER — Other Ambulatory Visit: Payer: Self-pay | Admitting: Internal Medicine

## 2020-01-20 ENCOUNTER — Ambulatory Visit (INDEPENDENT_AMBULATORY_CARE_PROVIDER_SITE_OTHER): Payer: Medicare Other | Admitting: *Deleted

## 2020-01-20 ENCOUNTER — Telehealth: Payer: Self-pay

## 2020-01-20 DIAGNOSIS — I441 Atrioventricular block, second degree: Secondary | ICD-10-CM

## 2020-01-20 LAB — CUP PACEART REMOTE DEVICE CHECK
Battery Impedance: 1369 Ohm
Battery Remaining Longevity: 49 mo
Battery Voltage: 2.76 V
Brady Statistic AP VP Percent: 1 %
Brady Statistic AP VS Percent: 86 %
Brady Statistic AS VP Percent: 1 %
Brady Statistic AS VS Percent: 11 %
Date Time Interrogation Session: 20210322154520
Implantable Lead Implant Date: 20111206
Implantable Lead Implant Date: 20111206
Implantable Lead Location: 753859
Implantable Lead Location: 753860
Implantable Lead Model: 5076
Implantable Lead Model: 5092
Implantable Pulse Generator Implant Date: 20111206
Lead Channel Impedance Value: 435 Ohm
Lead Channel Impedance Value: 819 Ohm
Lead Channel Pacing Threshold Amplitude: 0.875 V
Lead Channel Pacing Threshold Amplitude: 1 V
Lead Channel Pacing Threshold Pulse Width: 0.4 ms
Lead Channel Pacing Threshold Pulse Width: 0.4 ms
Lead Channel Setting Pacing Amplitude: 2 V
Lead Channel Setting Pacing Amplitude: 2.5 V
Lead Channel Setting Pacing Pulse Width: 0.4 ms
Lead Channel Setting Sensing Sensitivity: 5.6 mV

## 2020-01-20 NOTE — Telephone Encounter (Signed)
The pt is having difficulties sending a transmission. He is going to try again. I gave him the number to [facility] tech support to get additional help.

## 2020-01-21 NOTE — Progress Notes (Signed)
PPM Remote  

## 2020-03-25 ENCOUNTER — Other Ambulatory Visit: Payer: Self-pay | Admitting: Family Medicine

## 2020-03-25 DIAGNOSIS — M85851 Other specified disorders of bone density and structure, right thigh: Secondary | ICD-10-CM

## 2020-04-20 ENCOUNTER — Ambulatory Visit (INDEPENDENT_AMBULATORY_CARE_PROVIDER_SITE_OTHER): Payer: Medicare Other | Admitting: *Deleted

## 2020-04-20 ENCOUNTER — Other Ambulatory Visit: Payer: Self-pay | Admitting: Interventional Cardiology

## 2020-04-20 DIAGNOSIS — I4430 Unspecified atrioventricular block: Secondary | ICD-10-CM

## 2020-04-21 LAB — CUP PACEART REMOTE DEVICE CHECK
Battery Impedance: 1538 Ohm
Battery Remaining Longevity: 45 mo
Battery Voltage: 2.76 V
Brady Statistic AP VP Percent: 1 %
Brady Statistic AP VS Percent: 88 %
Brady Statistic AS VP Percent: 1 %
Brady Statistic AS VS Percent: 10 %
Date Time Interrogation Session: 20210621145638
Implantable Lead Implant Date: 20111206
Implantable Lead Implant Date: 20111206
Implantable Lead Location: 753859
Implantable Lead Location: 753860
Implantable Lead Model: 5076
Implantable Lead Model: 5092
Implantable Pulse Generator Implant Date: 20111206
Lead Channel Impedance Value: 460 Ohm
Lead Channel Impedance Value: 835 Ohm
Lead Channel Pacing Threshold Amplitude: 0.875 V
Lead Channel Pacing Threshold Amplitude: 0.875 V
Lead Channel Pacing Threshold Pulse Width: 0.4 ms
Lead Channel Pacing Threshold Pulse Width: 0.4 ms
Lead Channel Setting Pacing Amplitude: 2 V
Lead Channel Setting Pacing Amplitude: 2.5 V
Lead Channel Setting Pacing Pulse Width: 0.4 ms
Lead Channel Setting Sensing Sensitivity: 5.6 mV

## 2020-04-22 NOTE — Progress Notes (Signed)
Remote pacemaker transmission.   

## 2020-04-26 ENCOUNTER — Other Ambulatory Visit: Payer: Self-pay | Admitting: Physician Assistant

## 2020-04-26 ENCOUNTER — Other Ambulatory Visit: Payer: Self-pay | Admitting: Interventional Cardiology

## 2020-04-28 MED ORDER — METOPROLOL SUCCINATE 25 MG PO CS24: 25.0000 mg | EXTENDED_RELEASE_CAPSULE | Freq: Every day | ORAL | 1 refills | Status: DC

## 2020-05-05 NOTE — Progress Notes (Deleted)
Cardiology Office Note   Date:  05/05/2020   ID:  Gregory Mccormick, DOB May 12, 1929, MRN [id number]  PCP:  Tally Joe, MD    No chief complaint on file.    Wt Readings from Last 3 Encounters:  11/14/19 196 lb 6.4 oz (89.1 kg)  09/06/19 189 lb 1.6 oz (85.8 kg)  05/22/19 184 lb (83.5 kg)       History of Present Illness: Gregory Mccormick is a 86 y.o. 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  He had remote PCI in 2014 and previously had normal LV function.  He was hospitalized in 08/2018 with atypical chest pain. Troponins were flat and CTA was negative for PE. However, his EF was noted to be down - EF 30-35%, diffuse HK, moderate AS, mild MR, mod LAE, severely dilated RA, mildly dilated RV, mild TR, PASP 46, elevated CVP.  His wife [name] passed away in 19-May-2019.  He Was hospitalized and underwent right hip Hemiarthroplasty 04/30/2019 without complications. Patient had no trouble with heart failure or chest pain while in the hospital.  I saw patient 05/15/2019 with worsening leg edema right greater than left.  Edema resolved with diuresis.    In Jan 2021, he was doing well.     Past Medical History:  Diagnosis Date  . Anemia, unspecified   . Aortic stenosis   . Arthritis    "right knee" (03/28/2013  . CAD in native artery    takes Xarelto daily  . Cardiomyopathy (HCC)    EF 40%:  ECHO 11/25/2015 EF 55%-60%  . Carpal tunnel syndrome, bilateral   . Chronic systolic CHF (congestive heart failure) (HCC)   . CKD (chronic kidney disease), stage II   . Epistaxis   . Essential hypertension, benign    takes Metoprolol daily  . H/O hiatal hernia   . History of kidney stones   . Hyperlipidemia    takes Lipitor  daily  . Nonspecific abnormal unspecified cardiovascular function study   . OSA on CPAP    wears CPAP 02/03/16  . Pacemaker    MDT  . PAF (paroxysmal atrial fibrillation) (HCC)   . Paroxysmal ventricular tachycardia (HCC)   . PONV (postoperative nausea and vomiting)   . PVC's (premature ventricular contractions)   . Vitamin D deficiency     Past Surgical History:  Procedure Laterality Date  . CARDIAC CATHETERIZATION  03/21/2013  . CARDIOVERSION N/A 10/09/2018   Procedure: CARDIOVERSION;  Surgeon: Wendall Stade, MD;  Location: [facility] ENDOSCOPY;  Service: Cardiovascular;  Laterality: N/A;  . CARPAL TUNNEL RELEASE Right 02/05/2016   Procedure: RIGHT LIMITED OPEN CARPAL TUNNEL RELEASE;  Surgeon: Dominica Severin, MD;  Location: [facility] OR;  Service: Orthopedics;  Laterality: Right;  . CARPAL TUNNEL RELEASE Left 04/21/2016   Procedure: CARPAL TUNNEL RELEASE;  Surgeon: Dominica Severin, MD;  Location: [facility] OR;  Service: Orthopedics;  Laterality: Left;  . CATARACT EXTRACTION W/ INTRAOCULAR LENS  IMPLANT, BILATERAL Bilateral 2000's  . COLONOSCOPY    . CORONARY ANGIOPLASTY WITH STENT PLACEMENT  03/28/2013   "3 stents" (03/28/2013)  . HIP ARTHROPLASTY Right 04/30/2019   Procedure: ARTHROPLASTY BIPOLAR HIP (HEMIARTHROPLASTY);  Surgeon: Durene Romans, MD;  Location: [facility]S;  Service: Orthopedics;  Laterality: Right;  . INSERT / REPLACE / REMOVE PACEMAKER    .  KNEE CARTILAGE SURGERY Left 1972   "cut it open" (03/28/2013)  . KNEE CARTILAGE SURGERY Right ~ 1977   "cut it open" (03/28/2013)  . PACEMAKER INSERTION  10/05/10   MDT Adapta L implanted by Dr [name] for mobitz II second degree AV block  . PERCUTANEOUS CORONARY ROTOBLATOR INTERVENTION (PCI-R) N/A 03/28/2013   Procedure: PERCUTANEOUS CORONARY ROTOBLATOR INTERVENTION (PCI-R);  Surgeon: Corky Crafts, MD;  Location: [facility] CATH LAB;  Service: Cardiovascular;  Laterality: N/A;  . SEPTOPLASTY N/A 02/05/2016   Procedure: SEPTOPLASTY WITH INTRANASAL SKIN GRAFT;   Surgeon: Drema Halon, MD;  Location[facility] OR;  Service: ENT;  Laterality: N/A;  . SHOULDER HEMI-ARTHROPLASTY Right 1987   "got hit by sled & dragged down sheet > 200 feet; tore shoulder up" (03/28/2013)  . SHOULDER SURGERY Right 1970's   "pulled muscle apart; sewed it back together" (03/28/2013  . TOTAL KNEE ARTHROPLASTY Right 2007  . TOTAL KNEE ARTHROPLASTY  11/26/2012   Procedure: TOTAL KNEE ARTHROPLASTY;  Surgeon: Loanne Drilling, MD;  Location: [facility] ORS;  Service: Orthopedics;  Laterality: Left;  . TRANSURETHRAL RESECTION OF PROSTATE N/A 12/27/2017   Procedure: TRANSURETHRAL RESECTION OF THE PROSTATE (TURP);  Surgeon: Crista Elliot, MD;  Location: [facility];  Service: Urology;  Laterality: N/A;     Current Outpatient Medications  Medication Sig Dispense Refill  . amiodarone (PACERONE) 200 MG tablet Take 1 tablet (200 mg total) by mouth daily. 90 tablet 2  . atorvastatin (LIPITOR) 10 MG tablet TAKE 1 TABLET BY MOUTH DAILY AT 6 PM. 90 tablet 1  . ENTRESTO 24-26 MG TAKE 1 TABLET BY MOUTH TWICE A DAY 180 tablet 3  . furosemide (LASIX) 20 MG tablet Take 1 tablet (20 mg total) by mouth daily. 90 tablet 2  . Metoprolol Succinate 25 MG CS24 Take 25 mg by mouth daily. 90 capsule 1  . nitroGLYCERIN (NITROSTAT) 0.4 MG SL tablet Place 1 tablet (0.4 mg total) under the tongue every 5 (five) minutes as needed for chest pain. 25 tablet 5  . XARELTO 20 MG TABS tablet TAKE 1 TABLET (20 MG TOTAL) BY MOUTH DAILY WITH SUPPER. 60 tablet 11   No current facility-administered medications for this visit.    Allergies:   Penicillins and Hydromorphone hcl    Social History:  The patient  reports that he quit smoking about 46 years ago. His smoking use included cigarettes. He has a 26.00 pack-year smoking history. He has never used smokeless tobacco. He reports that he does not drink alcohol and does not use drugs.   Family History:  The patient's ***family history includes CAD (age of  onset: 58) in his brother; Cancer in an other family member; Heart disease in an other family member; Stroke in his brother.    ROS:  Please see the history of present illness.   Otherwise, review of systems are positive for ***.   All other systems are reviewed and negative.    PHYSICAL EXAM: VS:  There were no vitals taken for this visit. , BMI There is no height or weight on file to calculate BMI. GEN: Well nourished, well developed, in no acute distress  HEENT: normal  Neck: no JVD, carotid bruits, or masses Cardiac: ***RRR; no murmurs, rubs, or gallops,no edema  Respiratory:  clear to auscultation bilaterally, normal work of breathing GI: soft, nontender, nondistended, + BS MS: no deformity or atrophy  Skin: warm and dry, no rash Neuro:  Strength and sensation are intact  Psych: euthymic mood, full affect   EKG:   The ekg ordered today demonstrates ***   Recent Labs: 05/15/2019: NT-Pro BNP 418 09/06/2019: ALT 20; Hemoglobin 13.3; Platelets 172 09/07/2019: BUN 15; Creatinine, Ser 1.39; Potassium 4.9; Sodium 138   Lipid Panel    Component Value Date/Time   CHOL 108 04/30/2019 0518   CHOL 108 11/17/2017 0848   TRIG 29 04/30/2019 0518   HDL 52 04/30/2019 0518   HDL 56 11/17/2017 0848   CHOLHDL 2.1 04/30/2019 0518   VLDL 6 04/30/2019 0518   LDLCALC 50 04/30/2019 0518   LDLCALC 39 11/17/2017 0848     Other studies Reviewed: Additional studies/ records that were reviewed today with results demonstrating: ***.   ASSESSMENT AND PLAN:  1. CAD: 2. Aortic stenosis, moderate by echo in November 2020. 3. Pacemaker: Continue with EP follow-up. 4. Chronic systolic heart failure: 5. Atrial fibrillation: Xarelto for stroke prevention.  Amiodarone has been used to maintain sinus rhythm.   Current medicines are reviewed at length with the patient today.  The patient concerns regarding his medicines were addressed.  The following changes have been made:  No change***  Labs/  tests ordered today include: *** No orders of the defined types were placed in this encounter.   Recommend 150 minutes/week of aerobic exercise Low fat, low carb, high fiber diet recommended  Disposition:   FU in ***   Signed, Lance Muss, MD  05/05/2020 9:05 AM    [facility] [address] [place], [place], [place]  [address] Phone: [phone]; Fax: [phone]

## 2020-05-07 ENCOUNTER — Ambulatory Visit: Payer: Medicare Other | Admitting: Interventional Cardiology

## 2020-06-17 NOTE — Progress Notes (Signed)
Cardiology Office Note   Date:  06/18/2020   ID:  OSTEN JANEK, DOB 1928-11-10, MRN [id number]  PCP:  Tally Joe, MD    No chief complaint on file.  CAD  Wt Readings from Last 3 Encounters:  06/18/20 194 lb (88 kg)  11/14/19 196 lb 6.4 oz (89.1 kg)  09/06/19 189 lb 1.6 oz (85.8 kg)       History of Present Illness: Gregory Mccormick is a 86 y.o. 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  He had remote PCI in 2014 and previously had normal LV function.  He was hospitalized in 08/2018 with atypical chest pain. Troponins were flat and CTA was negative for PE. However, his EF was noted to be down - EF 30-35%, diffuse HK, moderate AS, mild MR, mod LAE, severely dilated RA, mildly dilated RV, mild TR, PASP 46, elevated CVP.  He Was hospitalized and underwent right hip Hemiarthroplasty 04/30/2019 without complications. Patient had no trouble with heart failure or chest pain while in the hospital.  His wife Gregory Mccormick passed away in 2019-05-26.  I saw patient 05/15/2019 with worsening leg edema right greater than left.  Edema resolved with diuresis.  After that time, he felt well.   Since the last visit, edema has stabilized.  Well controlled with diuretics.  Occasionally, he takes two pills.    Denies : Chest pain. Dizziness.  Nitroglycerin use. Orthopnea. Palpitations. Paroxysmal nocturnal dyspnea. Shortness of breath. Syncope.     Past Medical History:  Diagnosis Date  . Anemia, unspecified   . Aortic stenosis   . Arthritis    "right knee" (03/28/2013  . CAD in native artery    takes Xarelto daily  . Cardiomyopathy (HCC)    EF 40%:  ECHO 11/25/2015 EF 55%-60%  . Carpal tunnel syndrome, bilateral   . Chronic systolic  CHF (congestive heart failure) (HCC)   . CKD (chronic kidney disease), stage II   . Epistaxis   . Essential hypertension, benign    takes Metoprolol daily  . H/O hiatal hernia   . History of kidney stones   . Hyperlipidemia    takes Lipitor daily  . Nonspecific abnormal unspecified cardiovascular function study   . OSA on CPAP    wears CPAP 02/03/16  . Pacemaker    MDT  . PAF (paroxysmal atrial fibrillation) (HCC)   . Paroxysmal ventricular tachycardia (HCC)   . PONV (postoperative nausea and vomiting)   . PVC's (premature ventricular contractions)   . Vitamin D deficiency     Past Surgical History:  Procedure Laterality Date  . CARDIAC CATHETERIZATION  03/21/2013  . CARDIOVERSION N/A 10/09/2018   Procedure: CARDIOVERSION;  Surgeon: Wendall Stade, MD;  Location: [facility] ENDOSCOPY;  Service: Cardiovascular;  Laterality: N/A;  . CARPAL TUNNEL RELEASE Right 02/05/2016   Procedure: RIGHT LIMITED OPEN CARPAL TUNNEL RELEASE;  Surgeon: Dominica Severin, MD;  Location: [facility] OR;  Service: Orthopedics;  Laterality: Right;  . CARPAL TUNNEL RELEASE Left 04/21/2016   Procedure: CARPAL TUNNEL RELEASE;  Surgeon: Dominica Severin, MD;  Location: [facility] OR;  Service: Orthopedics;  Laterality: Left;  . CATARACT EXTRACTION W/ INTRAOCULAR LENS  IMPLANT, BILATERAL Bilateral 2000's  . COLONOSCOPY    . CORONARY ANGIOPLASTY WITH STENT PLACEMENT  03/28/2013   "3  stents" (03/28/2013)  . HIP ARTHROPLASTY Right 04/30/2019   Procedure: ARTHROPLASTY BIPOLAR HIP (HEMIARTHROPLASTY);  Surgeon: Durene Romans, MD;  Location: [facility] ORS;  Service: Orthopedics;  Laterality: Right;  . INSERT / REPLACE / REMOVE PACEMAKER    . KNEE CARTILAGE SURGERY Left 1972   "cut it open" (03/28/2013)  . KNEE CARTILAGE SURGERY Right ~ 1977   "cut it open" (03/28/2013)  . PACEMAKER INSERTION  10/05/10   MDT Adapta L implanted by Dr [name] for mobitz II second degree AV block  . PERCUTANEOUS CORONARY ROTOBLATOR INTERVENTION (PCI-R) N/A 03/28/2013    Procedure: PERCUTANEOUS CORONARY ROTOBLATOR INTERVENTION (PCI-R);  Surgeon: Corky Crafts, MD;  Location: [facility] CATH LAB;  Service: Cardiovascular;  Laterality: N/A;  . SEPTOPLASTY N/A 02/05/2016   Procedure: SEPTOPLASTY WITH INTRANASAL SKIN GRAFT;  Surgeon: Drema Halon, MD;  Location: [facility] OR;  Service: ENT;  Laterality: N/A;  . SHOULDER HEMI-ARTHROPLASTY Right 1987   "got hit by sled & dragged down sheet > 200 feet; tore shoulder up" (03/28/2013)  . SHOULDER SURGERY Right 1970's   "pulled muscle apart; sewed it back together" (03/28/2013  . TOTAL KNEE ARTHROPLASTY Right 2007  . TOTAL KNEE ARTHROPLASTY  11/26/2012   Procedure: TOTAL KNEE ARTHROPLASTY;  Surgeon: Loanne Drilling, MD;  Location: [facility] ORS;  Service: Orthopedics;  Laterality: Left;  . TRANSURETHRAL RESECTION OF PROSTATE N/A 12/27/2017   Procedure: TRANSURETHRAL RESECTION OF THE PROSTATE (TURP);  Surgeon: Crista Elliot, MD;  Location: [facility];  Service: Urology;  Laterality: N/A;     Current Outpatient Medications  Medication Sig Dispense Refill  . amiodarone (PACERONE) 200 MG tablet Take 1 tablet (200 mg total) by mouth daily. 90 tablet 2  . atorvastatin (LIPITOR) 10 MG tablet TAKE 1 TABLET BY MOUTH DAILY AT 6 PM. 90 tablet 1  . ENTRESTO 24-26 MG TAKE 1 TABLET BY MOUTH TWICE A DAY 180 tablet 3  . furosemide (LASIX) 20 MG tablet Take 1 tablet (20 mg total) by mouth daily. 90 tablet 2  . Metoprolol Succinate 25 MG CS24 Take 25 mg by mouth daily. 90 capsule 1  . nitroGLYCERIN (NITROSTAT) 0.4 MG SL tablet Place 1 tablet (0.4 mg total) under the tongue every 5 (five) minutes as needed for chest pain. 25 tablet 5  . XARELTO 20 MG TABS tablet TAKE 1 TABLET (20 MG TOTAL) BY MOUTH DAILY WITH SUPPER. 60 tablet 11   No current facility-administered medications for this visit.    Allergies:   Penicillins and Hydromorphone hcl    Social History:  The patient  reports that he quit smoking about 46 years ago.  His smoking use included cigarettes. He has a 26.00 pack-year smoking history. He has never used smokeless tobacco. He reports that he does not drink alcohol and does not use drugs.   Family History:  The patient's family history includes CAD (age of onset: 22) in his brother; Cancer in an other family member; Heart disease in an other family member; Stroke in his brother.    ROS:  Please see the history of present illness.   Otherwise, review of systems are positive for occasional leg swelling.   All other systems are reviewed and negative.    PHYSICAL EXAM: VS:  BP 118/72   Pulse 69   Ht 5\' 9"  (1.753 m)   Wt 194 lb (88 kg)   SpO2 97%   BMI 28.65 kg/m  , BMI Body mass index is 28.65 kg/m. GEN: Well nourished,  well developed, in no acute distress  HEENT: normal  Neck: no JVD, carotid bruits, or masses Cardiac: RRR; 2/6 systolic murmur, no rubs, or gallops,; tr pretibial edema bilaterally Respiratory:  clear to auscultation bilaterally, normal work of breathing GI: soft, nontender, nondistended, + BS MS: no deformity or atrophy  Skin: warm and dry, no rash Neuro:  Strength and sensation are intact Psych: euthymic mood, full affect   EKG:   The ekg ordered today demonstrates atrial pacing   Recent Labs: 09/06/2019: ALT 20; Hemoglobin 13.3; Platelets 172 09/07/2019: BUN 15; Creatinine, Ser 1.39; Potassium 4.9; Sodium 138   Lipid Panel    Component Value Date/Time   CHOL 108 04/30/2019 0518   CHOL 108 11/17/2017 0848   TRIG 29 04/30/2019 0518   HDL 52 04/30/2019 0518   HDL 56 11/17/2017 0848   CHOLHDL 2.1 04/30/2019 0518   VLDL 6 04/30/2019 0518   LDLCALC 50 04/30/2019 0518   LDLCALC 39 11/17/2017 0848     Other studies Reviewed: Additional studies/ records that were reviewed today with results demonstrating: labs reviewed- 09/2019 echo reviewed.   ASSESSMENT AND PLAN:  1. CAD: COntinue aggressive secondary prevention. No angina.  2. Aortic stenosis: Moderate in  09/2019. No sx of severe AS at this time.  3. Pacemaker: Followed with EP.  4. Chronic systolic heart failure: On Entresto.  No CHF sx. No evidence of volume overload. 5. Atrial fibrillation:  Xarelto for stroke prevention.  Check on dosage with Cr 1.85.   Current medicines are reviewed at length with the patient today.  The patient concerns regarding his medicines were addressed.  The following changes have been made:  No change  Labs/ tests ordered today include: Will likely need repeat labs in 11/21 No orders of the defined types were placed in this encounter.   Recommend 150 minutes/week of aerobic exercise Low fat, low carb, high fiber diet recommended  Disposition:   FU in 6 months   Signed, Lance Muss, MD  06/18/2020 11:00 AM    [facility] [address] [place], [place], [place]  [address] Phone: [phone]; Fax: [phone]

## 2020-06-18 ENCOUNTER — Other Ambulatory Visit: Payer: Self-pay

## 2020-06-18 ENCOUNTER — Encounter: Payer: Self-pay | Admitting: Interventional Cardiology

## 2020-06-18 ENCOUNTER — Ambulatory Visit (INDEPENDENT_AMBULATORY_CARE_PROVIDER_SITE_OTHER): Payer: Medicare Other | Admitting: Interventional Cardiology

## 2020-06-18 VITALS — BP 118/72 | HR 69 | Ht 69.0 in | Wt 194.0 lb

## 2020-06-18 DIAGNOSIS — I350 Nonrheumatic aortic (valve) stenosis: Secondary | ICD-10-CM

## 2020-06-18 DIAGNOSIS — I2510 Atherosclerotic heart disease of native coronary artery without angina pectoris: Secondary | ICD-10-CM

## 2020-06-18 DIAGNOSIS — Z950 Presence of cardiac pacemaker: Secondary | ICD-10-CM | POA: Diagnosis not present

## 2020-06-18 DIAGNOSIS — I519 Heart disease, unspecified: Secondary | ICD-10-CM | POA: Diagnosis not present

## 2020-06-18 DIAGNOSIS — I4821 Permanent atrial fibrillation: Secondary | ICD-10-CM

## 2020-06-18 NOTE — Patient Instructions (Signed)

## 2020-06-23 ENCOUNTER — Telehealth: Payer: Self-pay

## 2020-06-23 NOTE — Telephone Encounter (Signed)
-----   Message from Corky Crafts, MD sent at 06/20/2020  9:37 PM EDT ----- Can we see if Eliquis 2.5 mg can be obtained for him cheaper than Xarelto?  JV ----- Message ----- From: Awilda Metro, RPH-CPP Sent: 06/18/2020  12:04 PM EDT To: Corky Crafts, MD  Yes he'd qualify for Xarelto 15mg  dosing (CrCl is 12mL/min using most recent SCr). Might be worth switching to lower dose Eliquis 2.5mg  BID for lowest DOAC bleed risk - Xarelto is recommended to avoid in elderly patients due to higher bleed risk.  Thanks, 36m ----- Message ----- From: Aundra Millet, MD Sent: 06/18/2020  11:13 AM EDT To: 06/20/2020, RPH-CPP  Dose of Xarelto? Should it be 15mg .   Last Cr was 1.85 in 5/21.

## 2020-06-23 NOTE — Telephone Encounter (Signed)
Called and spoke to the patient's pharmacy and they state that the cost would be the same for the patient for Xarelto 15 mg QD or Eliquis 2.5 mg BID. Per Dr. Eldridge Dace we will switch patient's Xarelto to Eliquis at the lower dose of 2.5 mg BID.   Called and made patient aware. Samples have been left up front with a 30 day free card for patient to pick up. Instructed the patient to continue to avoid NSAIDS and watch for S/Sx of bleeding. He verbalized understanding and thanked me for the call.

## 2020-06-29 ENCOUNTER — Other Ambulatory Visit: Payer: Medicare Other

## 2020-07-13 ENCOUNTER — Other Ambulatory Visit: Payer: Self-pay

## 2020-07-13 MED ORDER — APIXABAN 2.5 MG PO TABS
2.5000 mg | ORAL_TABLET | Freq: Two times a day (BID) | ORAL | 5 refills | Status: DC
Start: 2020-07-13 — End: 2020-12-07

## 2020-07-13 NOTE — Telephone Encounter (Signed)
Called spoke with pt, received refill request for Eliquis rx, however Xarelto 20mg  QD is still on pt's medication list.  Called spoke with pt he states Dr changed pt from Xarelto to Eliquis after his last visit.  Per telephone note in Epic 06/23/20 pt was switched from Xarelto to Eliquis 2.5mg  BID. Will update pt's medication list.    Pt last saw Dr 06/25/20 06/18/20, last labs  09/07/19 Creat 1.39, previous Creat 1.71 on 09/05/20, these labs were while pt was in the hospital. Age 86, weight 88kg, based on last Creat pt is not on appropriate dosage of Eliquis.  Will ask 89, PharmD to review and advise on dosage.  Pt may need to repeat labwork to get baseline Creat out of the hospital.

## 2020-07-13 NOTE — Telephone Encounter (Signed)
Most recent SCr was 1.85 checked at [facility] on 03/26/20 in [place]. Will refill Eliquis 2.5mg  BID as age is also > 80

## 2020-07-24 ENCOUNTER — Other Ambulatory Visit: Payer: Self-pay | Admitting: Interventional Cardiology

## 2020-07-24 MED ORDER — METOPROLOL SUCCINATE 25 MG PO CS24: 25.0000 mg | EXTENDED_RELEASE_CAPSULE | Freq: Every day | ORAL | 3 refills | Status: DC

## 2020-09-30 IMAGING — CT CT ANGIO CHEST
2 of 7 series · 19 of 46 positions shown · IV contrast (APPLIED)
Comparison: Radiographs earlier this day.

CLINICAL DATA: PE suspected, intermediate prob, positive D-dimer.
Chest pain.

EXAM:
CT ANGIOGRAPHY CHEST WITH CONTRAST
TECHNIQUE: Multidetector CT imaging of the chest was performed using the
standard protocol during bolus administration of intravenous
contrast. Multiplanar CT image reconstructions and MIPs were
obtained to evaluate the vascular anatomy.
CONTRAST:  85mL 3FQDVZ-3WY IOPAMIDOL (3FQDVZ-3WY) INJECTION 76%

[Series 7: thins · axial · 0.71mm/px · z∈[+1177,+1418]mm · 16 of 388 slices shown]
[im 22/388  lung]
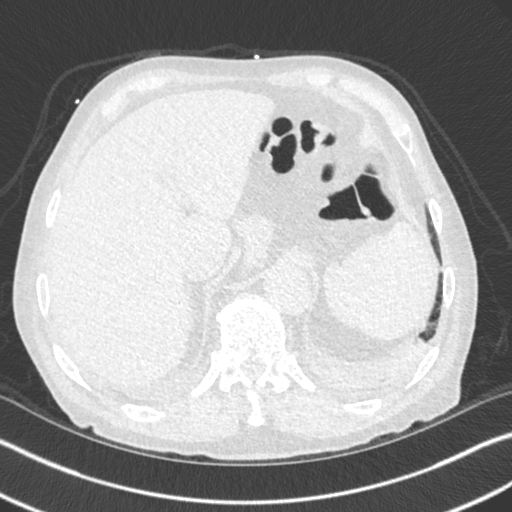
[im 44/388  soft-tissue]
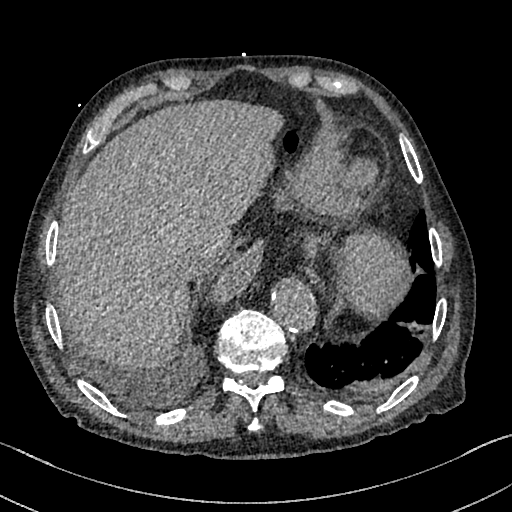
[im 65/388  lung]
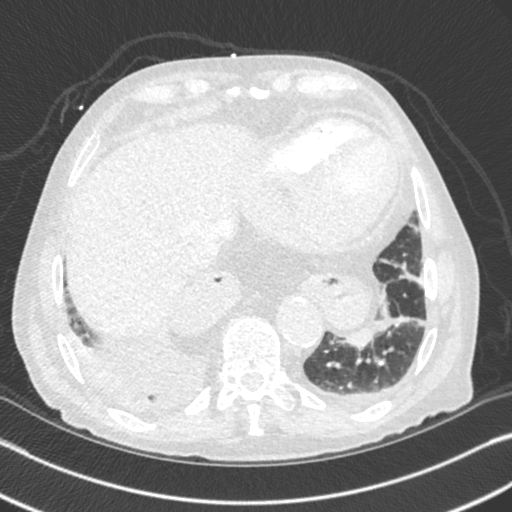
[im 87/388  soft-tissue]
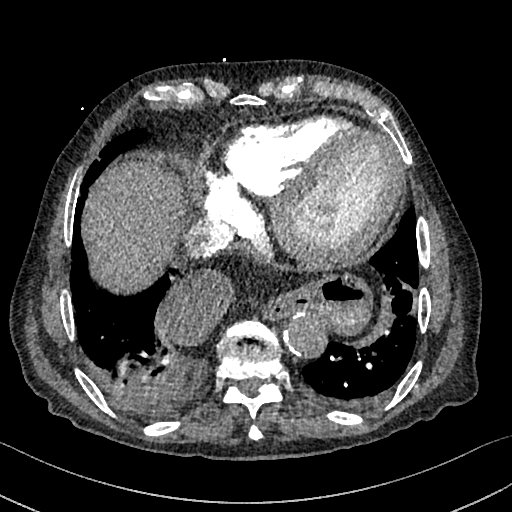
[im 108/388  lung]
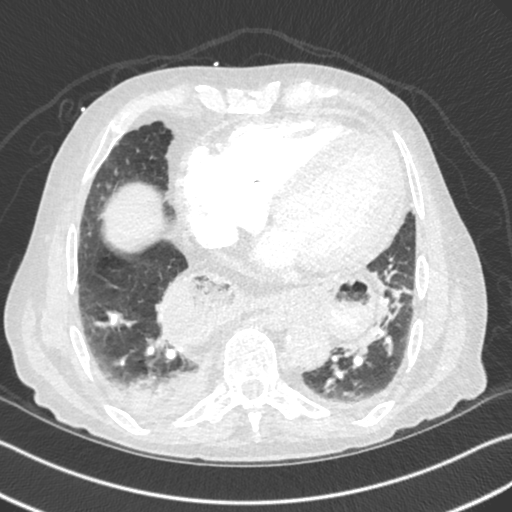
[im 130/388  soft-tissue]
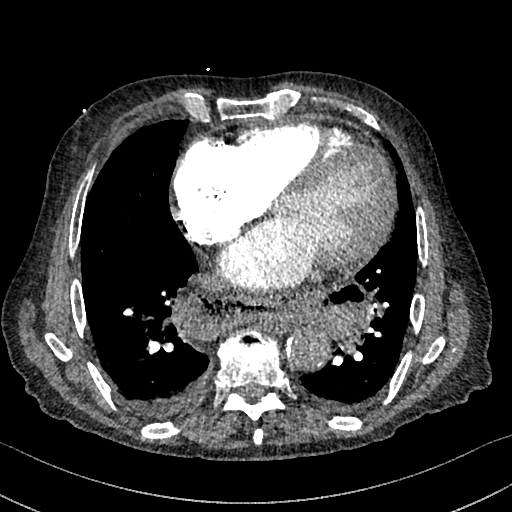
[im 151/388  lung]
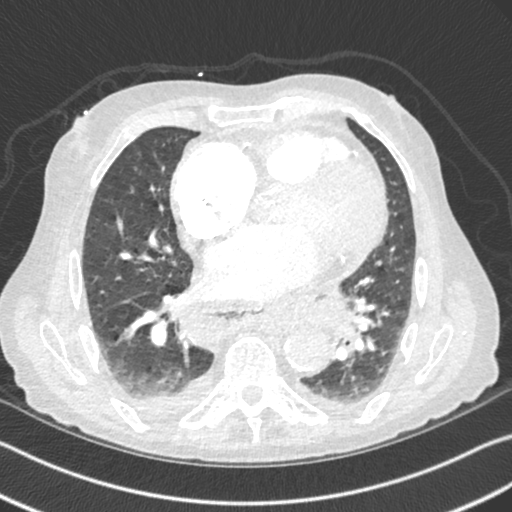
[im 173/388  soft-tissue]
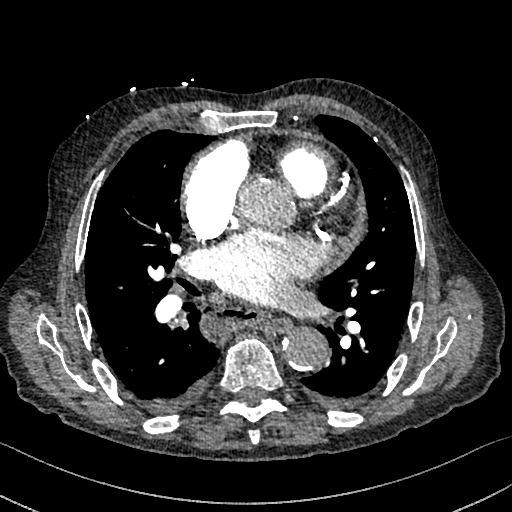
[im 216/388  lung]
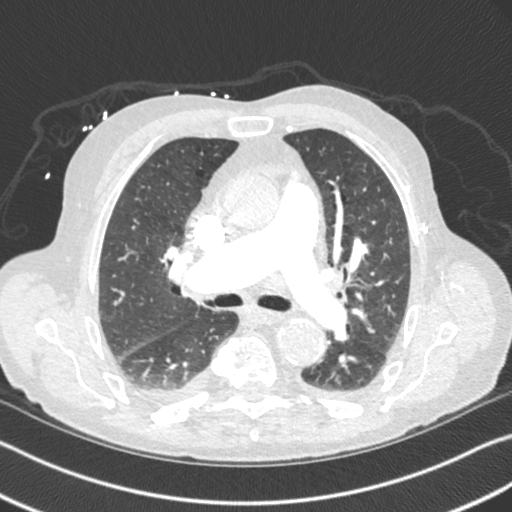
[im 237/388  soft-tissue]
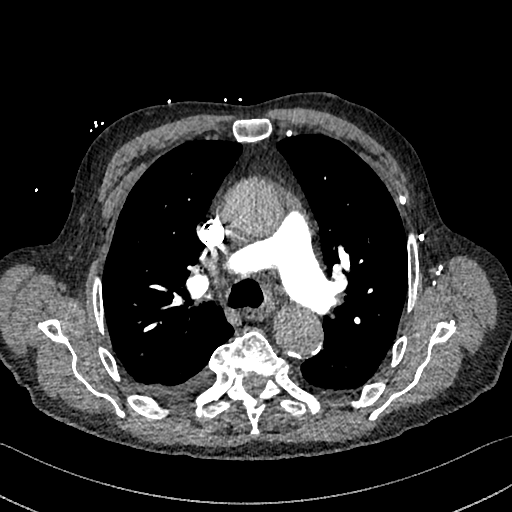
[im 259/388  lung]
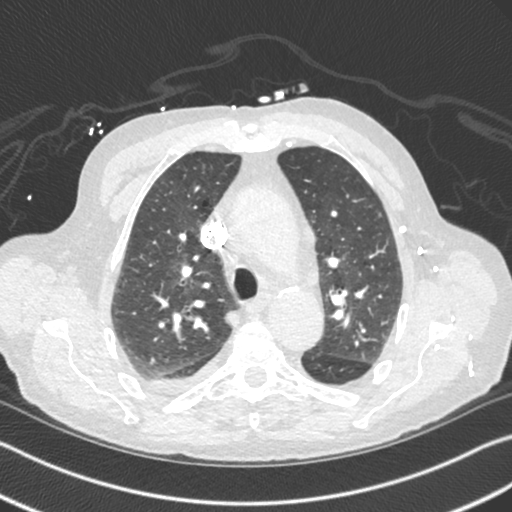
[im 280/388  soft-tissue]
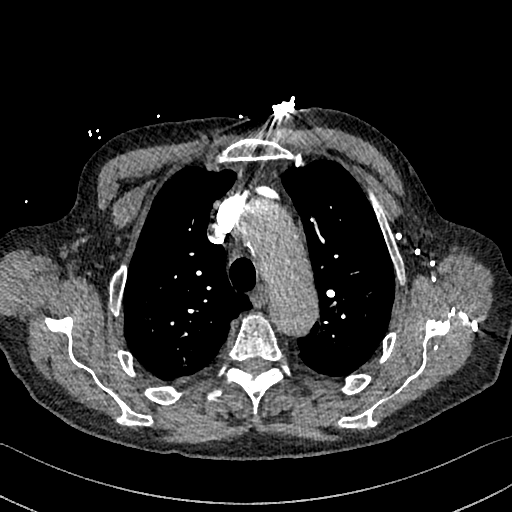
[im 302/388  lung]
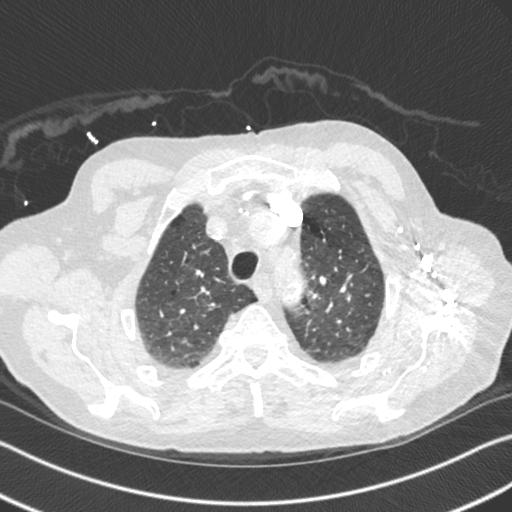
[im 323/388  soft-tissue]
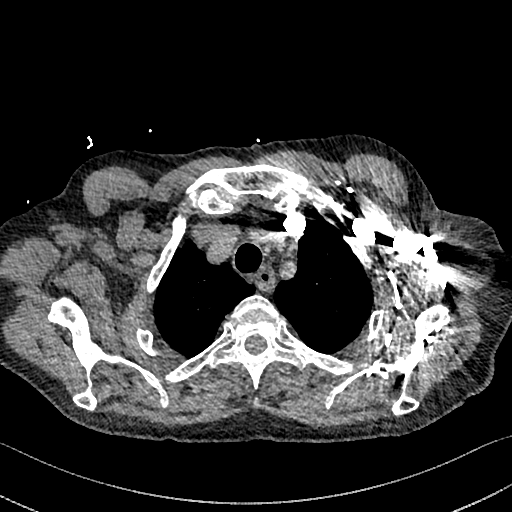
[im 345/388  lung]
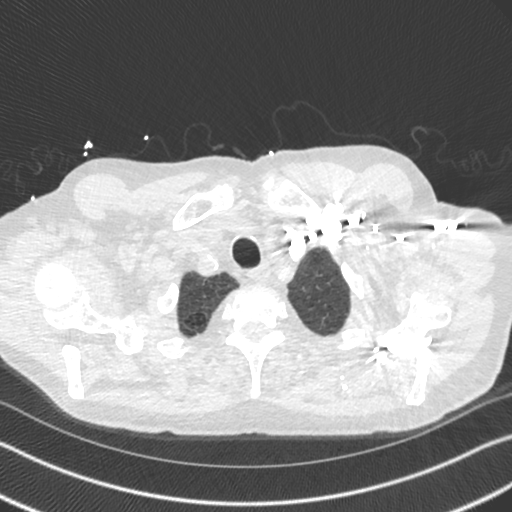
[im 366/388  soft-tissue]
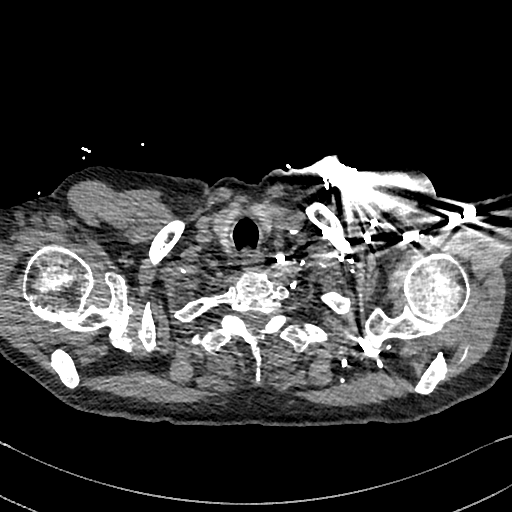

[Series 8: cor · coronal · 0.59mm/px · 3 of 127 slices shown]
[im 32/127  soft-tissue]
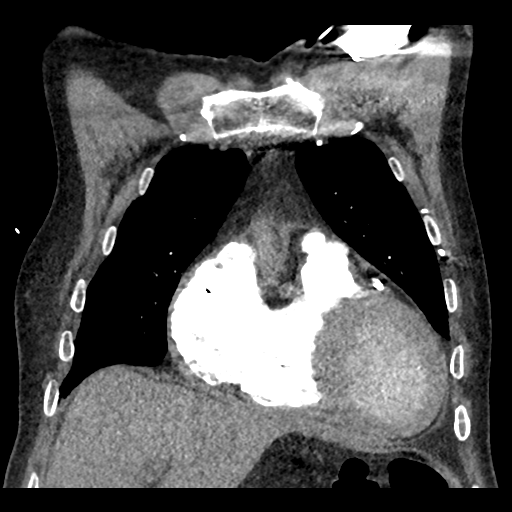
[im 64/127  soft-tissue]
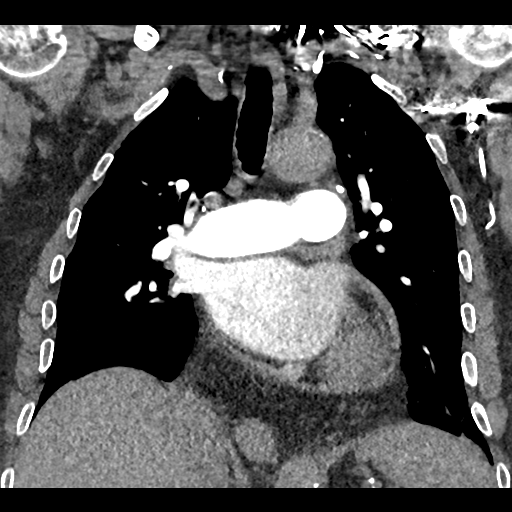
[im 95/127  soft-tissue]
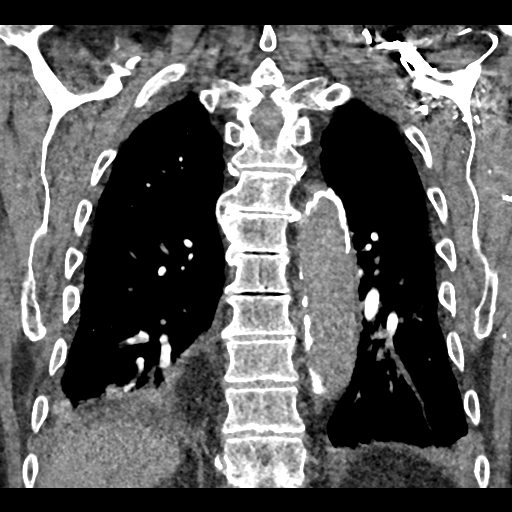

[19 of 46 positions shown; findings below may reference images not displayed]

Chest CT 12/17/2008.
Report from chest CT 03/24/2011, those images are unavailable.
FINDINGS: Cardiovascular: There are no filling defects within the pulmonary
arteries to suggest pulmonary embolus. Multi chamber cardiomegaly.
Tiny pericardial effusion. There are coronary artery calcifications
and stents. Atherosclerosis of the thoracic aorta without aneurysm.

Mediastinum/Nodes: Small calcified right hilar nodes. Small
noncalcified prevascular and paratracheal nodes, not enlarged by
size criteria. Large hiatal hernia, the majority of the stomach is
intrathoracic. No abnormal gastric distension. No visualized thyroid
nodule.

Lungs/Pleura: Small bilateral pleural effusions and adjacent
atelectasis. Compressive atelectasis adjacent to hiatal hernia. Mild
apical prominent emphysema. Previous right middle lobe and lower
lobe pulmonary nodules are not well visualized. No increasing nodule
or pulmonary mass. No pulmonary edema.

Upper Abdomen: No acute findings.

Musculoskeletal: There are no acute or suspicious osseous
abnormalities. Prior injury to the right coracoclavicular ligament.

Review of the MIP images confirms the above findings.
IMPRESSION: 1. No pulmonary embolus.
2. Small bilateral pleural effusions and adjacent atelectasis.
3. Large hiatal hernia.

Aortic Atherosclerosis (SZSFC-V8A.A) and Emphysema (SZSFC-WBJ.V).

## 2020-10-19 ENCOUNTER — Ambulatory Visit (INDEPENDENT_AMBULATORY_CARE_PROVIDER_SITE_OTHER): Payer: Medicare Other

## 2020-10-19 DIAGNOSIS — I4430 Unspecified atrioventricular block: Secondary | ICD-10-CM

## 2020-10-20 LAB — CUP PACEART REMOTE DEVICE CHECK
Battery Impedance: 1761 Ohm
Battery Remaining Longevity: 40 mo
Battery Voltage: 2.76 V
Brady Statistic AP VP Percent: 1 %
Brady Statistic AP VS Percent: 90 %
Brady Statistic AS VP Percent: 1 %
Brady Statistic AS VS Percent: 8 %
Date Time Interrogation Session: 20211220133046
Implantable Lead Implant Date: 20111206
Implantable Lead Implant Date: 20111206
Implantable Lead Location: 753859
Implantable Lead Location: 753860
Implantable Lead Model: 5076
Implantable Lead Model: 5092
Implantable Pulse Generator Implant Date: 20111206
Lead Channel Impedance Value: 474 Ohm
Lead Channel Impedance Value: 856 Ohm
Lead Channel Pacing Threshold Amplitude: 0.875 V
Lead Channel Pacing Threshold Amplitude: 1 V
Lead Channel Pacing Threshold Pulse Width: 0.4 ms
Lead Channel Pacing Threshold Pulse Width: 0.4 ms
Lead Channel Setting Pacing Amplitude: 2 V
Lead Channel Setting Pacing Amplitude: 2.5 V
Lead Channel Setting Pacing Pulse Width: 0.4 ms
Lead Channel Setting Sensing Sensitivity: 5.6 mV

## 2020-10-21 ENCOUNTER — Ambulatory Visit (INDEPENDENT_AMBULATORY_CARE_PROVIDER_SITE_OTHER): Payer: Medicare Other

## 2020-10-21 ENCOUNTER — Ambulatory Visit (INDEPENDENT_AMBULATORY_CARE_PROVIDER_SITE_OTHER): Payer: Medicare Other | Admitting: Podiatry

## 2020-10-21 ENCOUNTER — Other Ambulatory Visit: Payer: Self-pay

## 2020-10-21 ENCOUNTER — Other Ambulatory Visit: Payer: Self-pay | Admitting: Podiatry

## 2020-10-21 DIAGNOSIS — E7800 Pure hypercholesterolemia, unspecified: Secondary | ICD-10-CM | POA: Insufficient documentation

## 2020-10-21 DIAGNOSIS — N1832 Chronic kidney disease, stage 3b: Secondary | ICD-10-CM | POA: Insufficient documentation

## 2020-10-21 DIAGNOSIS — M722 Plantar fascial fibromatosis: Secondary | ICD-10-CM | POA: Diagnosis not present

## 2020-10-21 DIAGNOSIS — N529 Male erectile dysfunction, unspecified: Secondary | ICD-10-CM | POA: Insufficient documentation

## 2020-10-21 DIAGNOSIS — D6859 Other primary thrombophilia: Secondary | ICD-10-CM | POA: Insufficient documentation

## 2020-10-21 DIAGNOSIS — J439 Emphysema, unspecified: Secondary | ICD-10-CM | POA: Insufficient documentation

## 2020-10-21 DIAGNOSIS — M79672 Pain in left foot: Secondary | ICD-10-CM

## 2020-10-21 DIAGNOSIS — R209 Unspecified disturbances of skin sensation: Secondary | ICD-10-CM | POA: Insufficient documentation

## 2020-10-21 DIAGNOSIS — I359 Nonrheumatic aortic valve disorder, unspecified: Secondary | ICD-10-CM | POA: Insufficient documentation

## 2020-10-21 DIAGNOSIS — R7989 Other specified abnormal findings of blood chemistry: Secondary | ICD-10-CM | POA: Insufficient documentation

## 2020-10-21 DIAGNOSIS — M8580 Other specified disorders of bone density and structure, unspecified site: Secondary | ICD-10-CM | POA: Insufficient documentation

## 2020-10-21 DIAGNOSIS — E559 Vitamin D deficiency, unspecified: Secondary | ICD-10-CM | POA: Insufficient documentation

## 2020-10-21 MED ORDER — BETAMETHASONE SOD PHOS & ACET 6 (3-3) MG/ML IJ SUSP: 3.0000 mg | Freq: Once | INTRAMUSCULAR | Status: DC

## 2020-10-21 NOTE — Progress Notes (Signed)
   Subjective: 86 y.o. male presenting as a new patient for evaluation of left heel pain is been going on for approximately 2 weeks now.  Patient cannot recall an injury to the area.  He states that over the last 2 weeks it is gradually increased in pain.  He presents for further treatment and evaluation   Past Medical History:  Diagnosis Date  . Anemia, unspecified   . Aortic stenosis   . Arthritis    "right knee" (03/28/2013  . CAD in native artery    takes Xarelto daily  . Cardiomyopathy (HCC)    EF 40%:  ECHO 11/25/2015 EF 55%-60%  . Carpal tunnel syndrome, bilateral   . Chronic systolic CHF (congestive heart failure) (HCC)   . CKD (chronic kidney disease), stage II   . Epistaxis   . Essential hypertension, benign    takes Metoprolol daily  . H/O hiatal hernia   . History of kidney stones   . Hyperlipidemia    takes Lipitor daily  . Nonspecific abnormal unspecified cardiovascular function study   . OSA on CPAP    wears CPAP 02/03/16  . Pacemaker    MDT  . PAF (paroxysmal atrial fibrillation) (HCC)   . Paroxysmal ventricular tachycardia (HCC)   . PONV (postoperative nausea and vomiting)   . PVC's (premature ventricular contractions)   . Vitamin D deficiency      Objective: Physical Exam General: The patient is alert and oriented x3 in no acute distress.  Dermatology: Skin is warm, dry and supple bilateral lower extremities. Negative for open lesions or macerations bilateral.   Vascular: Dorsalis Pedis and Posterior Tibial pulses palpable bilateral.  Capillary fill time is immediate to all digits.  Neurological: Epicritic and protective threshold intact bilateral.   Musculoskeletal: Tenderness to palpation to the plantar aspect of the left heel along the plantar fascia. All other joints range of motion within normal limits bilateral. Strength 5/5 in all groups bilateral.   Radiographic exam: Normal osseous mineralization. Joint spaces preserved. No  fracture/dislocation/boney destruction. No other soft tissue abnormalities or radiopaque foreign bodies.  Plantar heel spur noted  Assessment: 1. Plantar fasciitis left foot  Plan of Care:  1. Patient evaluated. Xrays reviewed.   2. Injection of 0.5cc Celestone soluspan injected into the left plantar fascia.  3.  Patient is on anticoagulant Eliquis so no NSAIDs were prescribed today 4.  Recommend OTC Tylenol as needed 5.  Recommend good supportive shoes.  Recommend not going barefoot 6.  Return to clinic as needed   Felecia Shelling, DPM [facility]  Dr. Felecia Shelling, DPM    [address] [place], [place] [address]                Office [phone]  Fax [phone]

## 2020-10-21 NOTE — Patient Instructions (Addendum)
Plantar Fasciitis  Plantar fasciitis is a painful foot condition that affects the heel. It occurs when the band of tissue that connects the toes to the heel bone (plantar fascia) becomes irritated. This can happen as the result of exercising too much or doing other repetitive activities (overuse injury). The pain from plantar fasciitis can range from mild irritation to severe pain that makes it difficult to walk or move. The pain is usually worse in the morning after sleeping, or after sitting or lying down for a while. Pain may also be worse after long periods of walking or standing. What are the causes? This condition may be caused by:  Standing for long periods of time.  Wearing shoes that do not have good arch support.  Doing activities that put stress on joints (high-impact activities), including running, aerobics, and ballet.  Being overweight.  An abnormal way of walking (gait).  Tight muscles in the back of your lower leg (calf).  High arches in your feet.  Starting a new athletic activity. What are the signs or symptoms? The main symptom of this condition is heel pain. Pain may:  Be worse with first steps after a time of rest, especially in the morning after sleeping or after you have been sitting or lying down for a while.  Be worse after long periods of standing still.  Decrease after 30-45 minutes of activity, such as gentle walking. How is this diagnosed? This condition may be diagnosed based on your medical history and your symptoms. Your health care provider may ask questions about your activity level. Your health care provider will do a physical exam to check for:  A tender area on the bottom of your foot.  A high arch in your foot.  Pain when you move your foot.  Difficulty moving your foot. You may have imaging tests to confirm the diagnosis, such as:  X-rays.  Ultrasound.  MRI. How is this treated? Treatment for plantar fasciitis depends on how  severe your condition is. Treatment may include:  Rest, ice, applying pressure (compression), and raising the affected foot (elevation). This may be called RICE therapy. Your health care provider may recommend RICE therapy along with over-the-counter pain medicines to manage your pain.  Exercises to stretch your calves and your plantar fascia.  A splint that holds your foot in a stretched, upward position while you sleep (night splint).  Physical therapy to relieve symptoms and prevent problems in the future.  Injections of steroid medicine (cortisone) to relieve pain and inflammation.  Stimulating your plantar fascia with electrical impulses (extracorporeal shock wave therapy). This is usually the last treatment option before surgery.  Surgery, if other treatments have not worked after 12 months. Follow these instructions at home:  Managing pain, stiffness, and swelling  If directed, put ice on the painful area: ? Put ice in a plastic bag, or use a frozen bottle of water. ? Place a towel between your skin and the bag or bottle. ? Roll the bottom of your foot over the bag or bottle. ? Do this for 20 minutes, 2-3 times a day.  Wear athletic shoes that have air-sole or gel-sole cushions, or try wearing soft shoe inserts that are designed for plantar fasciitis.  Raise (elevate) your foot above the level of your heart while you are sitting or lying down. Activity  Avoid activities that cause pain. Ask your health care provider what activities are safe for you.  Do physical therapy exercises and stretches as told   by your health care provider.  Try activities and forms of exercise that are easier on your joints (low-impact). Examples include swimming, water aerobics, and biking. General instructions  Take over-the-counter and prescription medicines only as told by your health care provider.  Wear a night splint while sleeping, if told by your health care provider. Loosen the splint  if your toes tingle, become numb, or turn cold and blue.  Maintain a healthy weight, or work with your health care provider to lose weight as needed.  Keep all follow-up visits as told by your health care provider. This is important. Contact a health care provider if you:  Have symptoms that do not go away after caring for yourself at home.  Have pain that gets worse.  Have pain that affects your ability to move or do your daily activities. Summary  Plantar fasciitis is a painful foot condition that affects the heel. It occurs when the band of tissue that connects the toes to the heel bone (plantar fascia) becomes irritated.  The main symptom of this condition is heel pain that may be worse after exercising too much or standing still for a long time.  Treatment varies, but it usually starts with rest, ice, compression, and elevation (RICE therapy) and over-the-counter medicines to manage pain. This information is not intended to replace advice given to you by your health care provider. Make sure you discuss any questions you have with your health care provider. Document Revised: 09/29/2017 Document Reviewed: 08/14/2017 Elsevier Patient Education  2020 [employer]  

## 2020-10-26 ENCOUNTER — Other Ambulatory Visit: Payer: Self-pay | Admitting: Podiatry

## 2020-10-26 DIAGNOSIS — M722 Plantar fascial fibromatosis: Secondary | ICD-10-CM

## 2020-10-29 ENCOUNTER — Other Ambulatory Visit: Payer: Self-pay | Admitting: Interventional Cardiology

## 2020-11-02 ENCOUNTER — Other Ambulatory Visit: Payer: Self-pay

## 2020-11-02 ENCOUNTER — Emergency Department (HOSPITAL_COMMUNITY)
Admission: EM | Admit: 2020-11-02 | Discharge: 2020-11-02 | Disposition: A | Discharge status: Home / Self Care | Payer: Medicare Other | Attending: Emergency Medicine | Admitting: Emergency Medicine

## 2020-11-02 DIAGNOSIS — R040 Epistaxis: Secondary | ICD-10-CM | POA: Insufficient documentation

## 2020-11-02 DIAGNOSIS — Z5321 Procedure and treatment not carried out due to patient leaving prior to being seen by health care provider: Secondary | ICD-10-CM | POA: Insufficient documentation

## 2020-11-02 DIAGNOSIS — Z7901 Long term (current) use of anticoagulants: Secondary | ICD-10-CM | POA: Diagnosis not present

## 2020-11-02 NOTE — ED Notes (Signed)
Pt stated his Nose has completely stopped bleeding so he is going home

## 2020-11-02 NOTE — Progress Notes (Signed)
Remote pacemaker transmission.   

## 2020-11-02 NOTE — ED Triage Notes (Signed)
Pt reports bleeding from L nare since blowing his nose this morning at 0400. Takes eliquis. Bleeding controlled in triage, pt has nose packed with a cotton ball. Hx of same in R nare which required surgery.

## 2020-11-09 ENCOUNTER — Other Ambulatory Visit: Payer: Self-pay

## 2020-11-09 ENCOUNTER — Ambulatory Visit (INDEPENDENT_AMBULATORY_CARE_PROVIDER_SITE_OTHER): Payer: Medicare Other | Admitting: Podiatry

## 2020-11-09 DIAGNOSIS — M722 Plantar fascial fibromatosis: Secondary | ICD-10-CM | POA: Diagnosis not present

## 2020-11-09 MED ORDER — TRIAMCINOLONE ACETONIDE 10 MG/ML IJ SUSP
10.0000 mg | Freq: Once | INTRAMUSCULAR | Status: AC
  Administered 2020-11-09: 10 mg

## 2020-11-10 NOTE — Progress Notes (Signed)
Subjective:   Patient ID: Gregory Mccormick, male   DOB: 87 y.o.   MRN: [id number]   HPI Patient presents stating he is still having severe pain in the bottom of his left heel and states that he simply cannot bear proper weight on it at this time   ROS      Objective:  Physical Exam  Neurovascular status intact with patient's plantar left heel showing inflammation in the medial band with continued soreness upon palpation     Assessment:  Acute plantar fasciitis left inflammation fluid of the medial band     Plan:  H&P conditions reviewed and recommended air fracture walker to completely immobilize and reduce all pressure on the plantar heel.  This was dispensed and I also went ahead today and I did sterile prep and injected the fascia 3 mg Kenalog 5 mg Xylocaine at insertion

## 2020-11-16 ENCOUNTER — Ambulatory Visit (INDEPENDENT_AMBULATORY_CARE_PROVIDER_SITE_OTHER): Payer: Medicare Other | Admitting: Otolaryngology

## 2020-11-16 ENCOUNTER — Other Ambulatory Visit: Payer: Self-pay

## 2020-11-16 VITALS — Temp 97.0°F

## 2020-11-16 DIAGNOSIS — R040 Epistaxis: Secondary | ICD-10-CM

## 2020-11-16 NOTE — Progress Notes (Signed)
HPI: Gregory Mccormick is a 87 y.o. male who returns today for evaluation of recurrent left-sided epistaxis has had for the past several weeks.  Patient has history of chronic A. fib and is on a blood thinner, Eliquis.  He has had previous cauterization of the right side of his nose performed 5 years ago.  He has done well on the right side but has had some bleeding from the left side more recently..  Past Medical History:  Diagnosis Date  . Anemia, unspecified   . Aortic stenosis   . Arthritis    "right knee" (03/28/2013  . CAD in native artery    takes Xarelto daily  . Cardiomyopathy (HCC)    EF 40%:  ECHO 11/25/2015 EF 55%-60%  . Carpal tunnel syndrome, bilateral   . Chronic systolic CHF (congestive heart failure) (HCC)   . CKD (chronic kidney disease), stage II   . Epistaxis   . Essential hypertension, benign    takes Metoprolol daily  . H/O hiatal hernia   . History of kidney stones   . Hyperlipidemia    takes Lipitor daily  . Nonspecific abnormal unspecified cardiovascular function study   . OSA on CPAP    wears CPAP 02/03/16  . Pacemaker    MDT  . PAF (paroxysmal atrial fibrillation) (HCC)   . Paroxysmal ventricular tachycardia (HCC)   . PONV (postoperative nausea and vomiting)   . PVC's (premature ventricular contractions)   . Vitamin D deficiency    Past Surgical History:  Procedure Laterality Date  . CARDIAC CATHETERIZATION  03/21/2013  . CARDIOVERSION N/A 10/09/2018   Procedure: CARDIOVERSION;  Surgeon: Wendall Stade, MD;  Location: [facility] ENDOSCOPY;  Service: Cardiovascular;  Laterality: N/A;  . CARPAL TUNNEL RELEASE Right 02/05/2016   Procedure: RIGHT LIMITED OPEN CARPAL TUNNEL RELEASE;  Surgeon: Dominica Severin, MD;  Location: [facility] OR;  Service: Orthopedics;  Laterality: Right;  . CARPAL TUNNEL RELEASE Left 04/21/2016   Procedure: CARPAL TUNNEL RELEASE;  Surgeon: Dominica Severin, MD;  Location: [facility] OR;  Service: Orthopedics;  Laterality: Left;  . CATARACT EXTRACTION W/  INTRAOCULAR LENS  IMPLANT, BILATERAL Bilateral 2000's  . COLONOSCOPY    . CORONARY ANGIOPLASTY WITH STENT PLACEMENT  03/28/2013   "3 stents" (03/28/2013)  . HIP ARTHROPLASTY Right 04/30/2019   Procedure: ARTHROPLASTY BIPOLAR HIP (HEMIARTHROPLASTY);  Surgeon: Durene Romans, MD;  Location: [facility] ORS;  Service: Orthopedics;  Laterality: Right;  . INSERT / REPLACE / REMOVE PACEMAKER    . KNEE CARTILAGE SURGERY Left 1972   "cut it open" (03/28/2013)  . KNEE CARTILAGE SURGERY Right ~ 1977   "cut it open" (03/28/2013)  . PACEMAKER INSERTION  10/05/10   MDT Adapta L implanted by Dr [name] for mobitz II second degree AV block  . PERCUTANEOUS CORONARY ROTOBLATOR INTERVENTION (PCI-R) N/A 03/28/2013   Procedure: PERCUTANEOUS CORONARY ROTOBLATOR INTERVENTION (PCI-R);  Surgeon: Corky Crafts, MD;  Location[facility] CATH LAB;  Service: Cardiovascular;  Laterality: N/A;  . SEPTOPLASTY N/A 02/05/2016   Procedure: SEPTOPLASTY WITH INTRANASAL SKIN GRAFT;  Surgeon: Drema Halon, MD;  Location: [facility] OR;  Service: ENT;  Laterality: N/A;  . SHOULDER HEMI-ARTHROPLASTY Right 1987   "got hit by sled & dragged down sheet > 200 feet; tore shoulder up" (03/28/2013)  . SHOULDER SURGERY Right 1970's   "pulled muscle apart; sewed it back together" (03/28/2013  . TOTAL KNEE ARTHROPLASTY Right 2007  . TOTAL KNEE ARTHROPLASTY  11/26/2012   Procedure: TOTAL KNEE ARTHROPLASTY;  Surgeon: Loanne Drilling,  MD;  Location: [facility] ORS;  Service: Orthopedics;  Laterality: Left;  . TRANSURETHRAL RESECTION OF PROSTATE N/A 12/27/2017   Procedure: TRANSURETHRAL RESECTION OF THE PROSTATE (TURP);  Surgeon: Crista Elliot, MD;  Location: [facility];  Service: Urology;  Laterality: N/A;   Social History   Socioeconomic History  . Marital status: Married    Spouse name: Not on file  . Number of children: Not on file  . Years of education: Not on file  . Highest education level: Not on file  Occupational History  .  Occupation: retired    Associate Professor: [employer]  Tobacco Use  . Smoking status: Former Smoker    Packs/day: 1.00    Years: 26.00    Pack years: 26.00    Types: Cigarettes    Quit date: 10/31/1973    Years since quitting: 47.0  . Smokeless tobacco: Never Used  Vaping Use  . Vaping Use: Never used  Substance and Sexual Activity  . Alcohol use: No  . Drug use: No  . Sexual activity: Never  Other Topics Concern  . Not on file  Social History Narrative  . Not on file   Social Determinants of Health   Financial Resource Strain: Not on file  Food Insecurity: Not on file  Transportation Needs: Not on file  Physical Activity: Not on file  Stress: Not on file  Social Connections: Not on file   Family History  Problem Relation Age of Onset  . CAD Brother 69  . Stroke Brother   . Cancer Other   . Heart disease Other   . Heart attack Neg Hx   . Hypertension Neg Hx    Allergies  Allergen Reactions  . Penicillins Hives    Has tolerated Keflex      Has patient had a PCN reaction causing immediate rash, facial/tongue/throat swelling, SOB or lightheadedness with hypotension: YES Has patient had a PCN reaction causing severe rash involving mucus membranes or skin necrosis: No Has patient had a PCN reaction that required hospitalization No Has patient had a PCN reaction occurring within the last 10 years: No If all of the above answers are "NO", then may proceed with Cephalosporin use.   . Other Other (See Comments)  . Hydromorphone Hcl Nausea And Vomiting   Prior to Admission medications   Medication Sig Start Date End Date Taking? Authorizing Provider  amiodarone (PACERONE) 200 MG tablet TAKE 1 TABLET BY MOUTH EVERY DAY 07/24/20   Corky Crafts, MD  apixaban (ELIQUIS) 2.5 MG TABS tablet Take 1 tablet (2.5 mg total) by mouth 2 (two) times daily. 07/13/20   Corky Crafts, MD  atorvastatin (LIPITOR) 10 MG tablet TAKE 1 TABLET BY MOUTH DAILY AT 6 PM. 10/29/20   Corky Crafts, MD  ENTRESTO 24-26 MG TAKE 1 TABLET BY MOUTH TWICE A DAY 12/02/19   Berton Bon, NP  furosemide (LASIX) 20 MG tablet Take 1 tablet (20 mg total) by mouth daily. 04/27/20   Corky Crafts, MD  Metoprolol Succinate 25 MG CS24 Take 25 mg by mouth daily. 07/24/20   Corky Crafts, MD  nitroGLYCERIN (NITROSTAT) 0.4 MG SL tablet Place 1 tablet (0.4 mg total) under the tongue every 5 (five) minutes as needed for chest pain. 11/17/17   Corky Crafts, MD     Positive ROS: Otherwise negative  All other systems have been reviewed and were otherwise negative with the exception of those mentioned in the HPI and  as above.  Physical Exam: Constitutional: Alert, well-appearing, no acute distress Ears: External ears without lesions or tenderness. Ear canals are clear bilaterally with intact, clear TMs.  Nasal: External nose without lesions. Septum with several vessels on the left side and Kiesselbach's plexus that bleed mildly when rubbed with a Q-tip.  This area was cauterized using silver nitrate.  There was no prominent vessel noted and no abnormal intranasal masses noted. Oral: Lips and gums without lesions. Tongue and palate mucosa without lesions. Posterior oropharynx clear. Neck: No palpable adenopathy or masses Respiratory: Breathing comfortably  Skin: No facial/neck lesions or rash noted.  Control of epistaxis  Date/Time: 11/16/2020 3:42 PM Performed by: Drema Halon, MD Authorized by: Drema Halon, MD   Consent:    Consent obtained:  Verbal   Consent given by:  Patient   Risks discussed:  Bleeding and pain   Alternatives discussed:  No treatment and observation Procedure details:    Treatment site:  L anterior   Treatment method:  Silver nitrate   Treatment complexity:  Limited   Treatment episode: initial   Post-procedure details:    Patient tolerance of procedure:  Tolerated well, no immediate complications Comments:     Left anterior  septum was cauterized using silver nitrate.  Remaining nasal passageway was clear.  Bleeding appeared to of arisen from Kiesselbach's plexus.    Assessment: Left-sided epistaxis  Plan: This was cauterized using silver nitrate. Reviewed with him concerning use of cottonball and Afrin or cold water if he has any further nosebleeds. He will follow-up as needed.   Narda Bonds, MD

## 2020-11-19 ENCOUNTER — Other Ambulatory Visit: Payer: Self-pay

## 2020-11-19 MED ORDER — ENTRESTO 24-26 MG PO TABS: 1.0000 | ORAL_TABLET | Freq: Two times a day (BID) | ORAL | 2 refills | Status: DC

## 2020-11-23 ENCOUNTER — Other Ambulatory Visit: Payer: Self-pay

## 2020-11-23 ENCOUNTER — Ambulatory Visit
Admission: RE | Admit: 2020-11-23 | Discharge: 2020-11-23 | Disposition: A | Discharge status: Home / Self Care | Payer: Medicare Other | Source: Ambulatory Visit | Attending: Family Medicine | Admitting: Family Medicine

## 2020-11-23 DIAGNOSIS — M85851 Other specified disorders of bone density and structure, right thigh: Secondary | ICD-10-CM

## 2020-11-30 ENCOUNTER — Other Ambulatory Visit: Payer: Self-pay

## 2020-11-30 ENCOUNTER — Encounter: Payer: Self-pay | Admitting: Podiatry

## 2020-11-30 ENCOUNTER — Ambulatory Visit (INDEPENDENT_AMBULATORY_CARE_PROVIDER_SITE_OTHER): Payer: Medicare Other | Admitting: Podiatry

## 2020-11-30 DIAGNOSIS — M722 Plantar fascial fibromatosis: Secondary | ICD-10-CM | POA: Diagnosis not present

## 2020-11-30 DIAGNOSIS — M84375D Stress fracture, left foot, subsequent encounter for fracture with routine healing: Secondary | ICD-10-CM

## 2020-11-30 NOTE — Progress Notes (Signed)
Subjective:   Patient ID: Gregory Mccormick, male   DOB: 87 y.o.   MRN: [id number]   HPI Patient states he is doing much better but has a lot of questions concerning return to activity levels   ROS      Objective:  Physical Exam  Neurovascular status intact with significant diminishment of discomfort plantar aspect left heel with mild discomfort when pressing the heel from the medial lateral side with improvement there also.  Walking with a better heel toe gait when evaluated     Assessment:  Improvement of acute plantar fasciitis or severe symptoms left with possible stress fracture     Plan:  H&P education rendered continue boot usage but gradually reduce it over the next 2 to [redacted] weeks along with ice therapy stretching exercises topical medicines and shoe gear modification.  Patient will be seen back as symptoms indicate and I educated him on possibility for MRI or bone scan and other treatment modalities which may be necessary for condition

## 2020-12-07 ENCOUNTER — Telehealth: Payer: Self-pay

## 2020-12-07 DIAGNOSIS — I4821 Permanent atrial fibrillation: Secondary | ICD-10-CM

## 2020-12-07 MED ORDER — NITROGLYCERIN 0.4 MG SL SUBL: 0.4000 mg | SUBLINGUAL_TABLET | SUBLINGUAL | 5 refills | Status: DC | PRN

## 2020-12-07 MED ORDER — FUROSEMIDE 20 MG PO TABS: 20.0000 mg | ORAL_TABLET | Freq: Every day | ORAL | 2 refills | Status: DC

## 2020-12-07 MED ORDER — ENTRESTO 24-26 MG PO TABS: 1.0000 | ORAL_TABLET | Freq: Two times a day (BID) | ORAL | 2 refills | Status: DC

## 2020-12-07 MED ORDER — ATORVASTATIN CALCIUM 10 MG PO TABS: ORAL_TABLET | ORAL | 2 refills | Status: DC

## 2020-12-07 MED ORDER — AMIODARONE HCL 200 MG PO TABS: 200.0000 mg | ORAL_TABLET | Freq: Every day | ORAL | 2 refills | Status: DC

## 2020-12-07 MED ORDER — APIXABAN 2.5 MG PO TABS: 2.5000 mg | ORAL_TABLET | Freq: Two times a day (BID) | ORAL | 1 refills | Status: DC

## 2020-12-07 MED ORDER — METOPROLOL SUCCINATE ER 25 MG PO TB24: 25.0000 mg | ORAL_TABLET | Freq: Every day | ORAL | 2 refills | Status: DC

## 2020-12-07 NOTE — Telephone Encounter (Signed)
Chart reviewed and medication was changed to capsule form in May 2020 when dose was decreased.  Patient reports he has no problem swallowing medications and is fine with changing to normal Toprol XL 25 mg.   Patient also requests medications filled by Dr Eldridge Dace be sent to [facility].  Will send in

## 2020-12-07 NOTE — Telephone Encounter (Signed)
[facility] mail order pharmacy is requesting a refill on Gregory Mccormick capsules. This medication is not on pt's medication list. Pt states that he has been taking this medication for a while. Would Dr. Eldridge Dace like to reorder this medication? Please address

## 2020-12-07 NOTE — Telephone Encounter (Signed)
Pt wanted to change preferred pharmacy. Change completed.

## 2020-12-07 NOTE — Telephone Encounter (Signed)
Prescription refill request for Eliquis received. Indication: a fb Last office visit: 06/18/20 Scr: 1.610 Age: 87 Weight: 88kg

## 2020-12-07 NOTE — Telephone Encounter (Signed)
Gregory Mccormick is a sprinkle capsule form of metoprolol.  I would verify with the patient that this is the dosage form he is actually taking.  If not, switch to normal Toprol XL 25

## 2021-01-18 ENCOUNTER — Ambulatory Visit (INDEPENDENT_AMBULATORY_CARE_PROVIDER_SITE_OTHER): Payer: Medicare Other

## 2021-01-18 DIAGNOSIS — I255 Ischemic cardiomyopathy: Secondary | ICD-10-CM

## 2021-01-19 LAB — CUP PACEART REMOTE DEVICE CHECK
Battery Impedance: 1848 Ohm
Battery Remaining Longevity: 38 mo
Battery Voltage: 2.75 V
Brady Statistic AP VP Percent: 1 %
Brady Statistic AP VS Percent: 91 %
Brady Statistic AS VP Percent: 1 %
Brady Statistic AS VS Percent: 7 %
Date Time Interrogation Session: 20220322113053
Implantable Lead Implant Date: 20111206
Implantable Lead Implant Date: 20111206
Implantable Lead Location: 753859
Implantable Lead Location: 753860
Implantable Lead Model: 5076
Implantable Lead Model: 5092
Implantable Pulse Generator Implant Date: 20111206
Lead Channel Impedance Value: 474 Ohm
Lead Channel Impedance Value: 850 Ohm
Lead Channel Pacing Threshold Amplitude: 1 V
Lead Channel Pacing Threshold Amplitude: 1 V
Lead Channel Pacing Threshold Pulse Width: 0.4 ms
Lead Channel Pacing Threshold Pulse Width: 0.4 ms
Lead Channel Setting Pacing Amplitude: 2 V
Lead Channel Setting Pacing Amplitude: 2.5 V
Lead Channel Setting Pacing Pulse Width: 0.4 ms
Lead Channel Setting Sensing Sensitivity: 5.6 mV

## 2021-01-26 NOTE — Progress Notes (Signed)
Remote pacemaker transmission.   

## 2021-01-27 NOTE — Progress Notes (Signed)
Cardiology Office Note   Date:  01/28/2021   ID:  Gregory Mccormick, DOB Feb 02, 1929, MRN [id number]  PCP:  Tally Joe, MD    No chief complaint on file.  CAD  Wt Readings from Last 3 Encounters:  01/28/21 201 lb (91.2 kg)  06/18/20 194 lb (88 kg)  11/14/19 196 lb 6.4 oz (89.1 kg)       History of Present Illness: Gregory Mccormick is a 87 y.o. 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  He had remote PCI in 2014 and previously had normal LV function.  He was hospitalized in 08/2018 with atypical chest pain. Troponins were flat and CTA was negative for PE. However, his EF was noted to be down - EF 30-35%, diffuse HK, moderate AS, mild MR, mod LAE, severely dilated RA, mildly dilated RV, mild TR, PASP 46, elevated CVP.  HeWas hospitalized and underwent right hip Hemiarthroplasty 04/30/2019 without complications. Patient had no trouble with heart failure or chest pain while in the hospital.  His wife [name] passed away in 05/21/19.  I saw patient 05/15/2019 with worsening leg edema right greater than left. Edema resolved with diuresis.  After that time, he felt well.   Denies : Chest pain. Dizziness. Nitroglycerin use. Orthopnea. Palpitations. Paroxysmal nocturnal dyspnea. Shortness of breath. Syncope.   Has not gotten COVID.  Still has some intermittent swelling.  Walks a little with his dog.  Feels well.   Past Medical History:  Diagnosis Date  . Anemia, unspecified   . Aortic stenosis   . Arthritis    "right knee" (03/28/2013  . CAD in native artery    takes Xarelto daily  . Cardiomyopathy (HCC)    EF 40%:  ECHO 11/25/2015 EF 55%-60%  . Carpal tunnel syndrome, bilateral   . Chronic systolic CHF (congestive  heart failure) (HCC)   . CKD (chronic kidney disease), stage II   . Epistaxis   . Essential hypertension, benign    takes Metoprolol daily  . H/O hiatal hernia   . History of kidney stones   . Hyperlipidemia    takes Lipitor daily  . Nonspecific abnormal unspecified cardiovascular function study   . OSA on CPAP    wears CPAP 02/03/16  . Pacemaker    MDT  . PAF (paroxysmal atrial fibrillation) (HCC)   . Paroxysmal ventricular tachycardia (HCC)   . PONV (postoperative nausea and vomiting)   . PVC's (premature ventricular contractions)   . Vitamin D deficiency     Past Surgical History:  Procedure Laterality Date  . CARDIAC CATHETERIZATION  03/21/2013  . CARDIOVERSION N/A 10/09/2018   Procedure: CARDIOVERSION;  Surgeon: Wendall Stade, MD;  Location: [facility] ENDOSCOPY;  Service: Cardiovascular;  Laterality: N/A;  . CARPAL TUNNEL RELEASE Right 02/05/2016   Procedure: RIGHT LIMITED OPEN CARPAL TUNNEL RELEASE;  Surgeon: Dominica Severin, MD;  Location: [facility] OR;  Service: Orthopedics;  Laterality: Right;  . CARPAL TUNNEL RELEASE Left 04/21/2016   Procedure: CARPAL TUNNEL RELEASE;  Surgeon: Dominica Severin, MD;  Location: [facility] OR;  Service: Orthopedics;  Laterality: Left;  . CATARACT EXTRACTION W/ INTRAOCULAR LENS  IMPLANT, BILATERAL Bilateral 2000's  . COLONOSCOPY    . CORONARY ANGIOPLASTY WITH STENT PLACEMENT  03/28/2013   "3 stents" (03/28/2013)  . HIP ARTHROPLASTY Right  04/30/2019   Procedure: ARTHROPLASTY BIPOLAR HIP (HEMIARTHROPLASTY);  Surgeon: Durene Romans, MD;  Location: [facility] ORS;  Service: Orthopedics;  Laterality: Right;  . INSERT / REPLACE / REMOVE PACEMAKER    . KNEE CARTILAGE SURGERY Left 1972   "cut it open" (03/28/2013)  . KNEE CARTILAGE SURGERY Right ~ 1977   "cut it open" (03/28/2013)  . PACEMAKER INSERTION  10/05/10   MDT Adapta L implanted by Dr [name] for mobitz II second degree AV block  . PERCUTANEOUS CORONARY ROTOBLATOR INTERVENTION (PCI-R) N/A 03/28/2013   Procedure: PERCUTANEOUS  CORONARY ROTOBLATOR INTERVENTION (PCI-R);  Surgeon: Corky Crafts, MD;  Location: [facility] CATH LAB;  Service: Cardiovascular;  Laterality: N/A;  . SEPTOPLASTY N/A 02/05/2016   Procedure: SEPTOPLASTY WITH INTRANASAL SKIN GRAFT;  Surgeon: Drema Halon, MD;  Location: [facility] OR;  Service: ENT;  Laterality: N/A;  . SHOULDER HEMI-ARTHROPLASTY Right 1987   "got hit by sled & dragged down sheet > 200 feet; tore shoulder up" (03/28/2013)  . SHOULDER SURGERY Right 1970's   "pulled muscle apart; sewed it back together" (03/28/2013  . TOTAL KNEE ARTHROPLASTY Right 2007  . TOTAL KNEE ARTHROPLASTY  11/26/2012   Procedure: TOTAL KNEE ARTHROPLASTY;  Surgeon: Loanne Drilling, MD;  Location: [facility] ORS;  Service: Orthopedics;  Laterality: Left;  . TRANSURETHRAL RESECTION OF PROSTATE N/A 12/27/2017   Procedure: TRANSURETHRAL RESECTION OF THE PROSTATE (TURP);  Surgeon: Crista Elliot, MD;  Location: [facility];  Service: Urology;  Laterality: N/A;     Current Outpatient Medications  Medication Sig Dispense Refill  . amiodarone (PACERONE) 200 MG tablet Take 1 tablet (200 mg total) by mouth daily. 90 tablet 2  . apixaban (ELIQUIS) 2.5 MG TABS tablet Take 1 tablet (2.5 mg total) by mouth 2 (two) times daily. 180 tablet 1  . atorvastatin (LIPITOR) 10 MG tablet TAKE 1 TABLET BY MOUTH DAILY AT 6 PM. 90 tablet 2  . furosemide (LASIX) 20 MG tablet Take 1 tablet (20 mg total) by mouth daily. 90 tablet 2  . latanoprost (XALATAN) 0.005 % ophthalmic solution SMARTSIG:In Eye(s)    . metoprolol succinate (TOPROL XL) 25 MG 24 hr tablet Take 1 tablet (25 mg total) by mouth daily. 90 tablet 2  . nitroGLYCERIN (NITROSTAT) 0.4 MG SL tablet Place 1 tablet (0.4 mg total) under the tongue every 5 (five) minutes as needed for chest pain. 25 tablet 5  . sacubitril-valsartan (ENTRESTO) 24-26 MG Take 1 tablet by mouth 2 (two) times daily. 180 tablet 2   Current Facility-Administered Medications  Medication Dose Route  Frequency Provider Last Rate Last Admin  . betamethasone acetate-betamethasone sodium phosphate (CELESTONE) injection 3 mg  3 mg Intramuscular Once Felecia Shelling, DPM        Allergies:   Penicillins, Other, and Hydromorphone hcl    Social History:  The patient  reports that he quit smoking about 47 years ago. His smoking use included cigarettes. He has a 26.00 pack-year smoking history. He has never used smokeless tobacco. He reports that he does not drink alcohol and does not use drugs.   Family History:  The patient's family history includes CAD (age of onset: 43) in his brother; Cancer in an other family member; Heart disease in an other family member; Stroke in his brother.    ROS:  Please see the history of present illness.   Otherwise, review of systems are positive for orthostatic sx if he stands too quickly.   All other systems are reviewed  and negative.    PHYSICAL EXAM: VS:  BP 104/62   Pulse 86   Ht 5\' 9"  (1.753 m)   Wt 201 lb (91.2 kg)   SpO2 93%   BMI 29.68 kg/m  , BMI Body mass index is 29.68 kg/m. GEN: Well nourished, well developed, in no acute distress  HEENT: normal  Neck: no JVD, carotid bruits, or masses Cardiac: RRR; no murmurs, rubs, or gallops,; tr edema  Respiratory:  clear to auscultation bilaterally, normal work of breathing GI: soft, nontender, nondistended, + BS MS: no deformity or atrophy  Skin: warm and dry, no rash Neuro:  Strength and sensation are intact Psych: euthymic mood, full affect   EKG:   The ekg ordered in 05/2020 demonstrates A-paced   Recent Labs: No results found for requested labs within last 8760 hours.   Lipid Panel    Component Value Date/Time   CHOL 108 04/30/2019 0518   CHOL 108 11/17/2017 0848   TRIG 29 04/30/2019 0518   HDL 52 04/30/2019 0518   HDL 56 11/17/2017 0848   CHOLHDL 2.1 04/30/2019 0518   VLDL 6 04/30/2019 0518   LDLCALC 50 04/30/2019 0518   LDLCALC 39 11/17/2017 0848     Other studies  Reviewed: Additional studies/ records that were reviewed today with results demonstrating: labs reviewed.   ASSESSMENT AND PLAN:  1. CAD: No angina. Continue aggressive secondary prevention.   2. Aortic stenosis: Moderate in November 2020.  No sx of severe AS.   3. Pacemaker: Followed by EP.  Needs a f/u appt with them as it has been a few years.  Will set up for 6 months.  4. Chronic systolic heart failure: We will give flexibility to take an extra diuretic tablet if he felt volume overloaded.  Appears euvolemic.  E has done well with Entresto.  5. AFib: Xarelto for stroke prevention.  Hbg stable in 11/21.   Current medicines are reviewed at length with the patient today.  The patient concerns regarding his medicines were addressed.  The following changes have been made:  No change  Labs/ tests ordered today include:  No orders of the defined types were placed in this encounter.   Recommend 150 minutes/week of aerobic exercise Low fat, low carb, high fiber diet recommended  Disposition:   FU in 1 year with me, 6 months with EP   Signed, 12/21, MD  01/28/2021 8:22 AM    [facility] [address] [place], [place], [place]  [place] Phone: [phone]; Fax: [phone]

## 2021-01-28 ENCOUNTER — Ambulatory Visit (INDEPENDENT_AMBULATORY_CARE_PROVIDER_SITE_OTHER): Payer: Medicare Other | Admitting: Interventional Cardiology

## 2021-01-28 ENCOUNTER — Other Ambulatory Visit: Payer: Self-pay

## 2021-01-28 ENCOUNTER — Encounter: Payer: Self-pay | Admitting: Interventional Cardiology

## 2021-01-28 VITALS — BP 104/62 | HR 86 | Ht 69.0 in | Wt 201.0 lb

## 2021-01-28 DIAGNOSIS — I10 Essential (primary) hypertension: Secondary | ICD-10-CM

## 2021-01-28 DIAGNOSIS — Z950 Presence of cardiac pacemaker: Secondary | ICD-10-CM

## 2021-01-28 DIAGNOSIS — I2510 Atherosclerotic heart disease of native coronary artery without angina pectoris: Secondary | ICD-10-CM

## 2021-01-28 DIAGNOSIS — I4821 Permanent atrial fibrillation: Secondary | ICD-10-CM | POA: Diagnosis not present

## 2021-01-28 DIAGNOSIS — I519 Heart disease, unspecified: Secondary | ICD-10-CM | POA: Diagnosis not present

## 2021-01-28 DIAGNOSIS — I350 Nonrheumatic aortic (valve) stenosis: Secondary | ICD-10-CM

## 2021-01-28 NOTE — Patient Instructions (Signed)
Medication Instructions:  Your physician recommends that you continue on your current medications as directed. Please refer to the Current Medication list given to you today.  *If you need a refill on your cardiac medications before your next appointment, please call your pharmacy*   Lab Work: none If you have labs (blood work) drawn today and your tests are completely normal, you will receive your results only by: Marland Kitchen MyChart Message (if you have MyChart) OR . A paper copy in the mail If you have any lab test that is abnormal or we need to change your treatment, we will call you to review the results.   Testing/Procedures: none   Follow-Up: At [facility], you and your health needs are our priority.  As part of our continuing mission to provide you with exceptional heart care, we have created designated Provider Care Teams.  These Care Teams include your primary Cardiologist (physician) and Advanced Practice Providers (APPs -  Physician Assistants and Nurse Practitioners) who all work together to provide you with the care you need, when you need it.  We recommend signing up for the patient portal called "MyChart".  Sign up information is provided on this After Visit Summary.  MyChart is used to connect with patients for Virtual Visits (Telemedicine).  Patients are able to view lab/test results, encounter notes, upcoming appointments, etc.  Non-urgent messages can be sent to your provider as well.   To learn more about what you can do with MyChart, go to [email or web address].    Your next appointment:   12 month(s)  The format for your next appointment:   In Person  Provider:   You may see Lance Muss, MD or one of the following Advanced Practice Providers on your designated Care Team:    Ronie Spies, PA-C  Jacolyn Reedy, PA-C    Other Instructions Please schedule 6 month follow up with Dr [name] or APP

## 2021-02-22 ENCOUNTER — Telehealth: Payer: Self-pay | Admitting: Interventional Cardiology

## 2021-02-22 DIAGNOSIS — R5383 Other fatigue: Secondary | ICD-10-CM

## 2021-02-22 DIAGNOSIS — R0602 Shortness of breath: Secondary | ICD-10-CM

## 2021-02-22 DIAGNOSIS — R42 Dizziness and giddiness: Secondary | ICD-10-CM

## 2021-02-22 DIAGNOSIS — I359 Nonrheumatic aortic valve disorder, unspecified: Secondary | ICD-10-CM

## 2021-02-22 DIAGNOSIS — Z79899 Other long term (current) drug therapy: Secondary | ICD-10-CM

## 2021-02-22 DIAGNOSIS — I5032 Chronic diastolic (congestive) heart failure: Secondary | ICD-10-CM

## 2021-02-22 NOTE — Telephone Encounter (Signed)
Pt c/o Shortness Of Breath: STAT if SOB developed within the last 24 hours or pt is noticeably SOB on the phone  1. Are you currently SOB (can you hear that pt is SOB on the phone)? yes  2. How long have you been experiencing SOB?last few days it is getting worse   3. Are you SOB when sitting or when up moving around?  Sitting and moving, worse when moving  4. Are you currently experiencing any other symptoms? dizzy

## 2021-02-22 NOTE — Telephone Encounter (Signed)
Orders placed for echo and lab per Dr Eldridge Dace.  Pt is aware and agreeable.  Lab scheduled for 4/26.  Pt is aware he will be contacted to be scheduled for the echocardiogram.  He is aware it is to be completed here, at our office and he may eat/drink as normal this day.   He is alsoaware to continue to stay very well hydrated.

## 2021-02-22 NOTE — Telephone Encounter (Signed)
Pt called in today complaining of having episodes of dizziness, fatigue and some SOB.  He reports the lightheadedness started last Wednesday or Thursday.  When he stood up, while getting out of bed, he had to hold onto something.  He also reports when he lays down the bed feels like it is "going around with him."  Now, since Saturday he has had lightheadedness with most position changes.  At times he feels more tired and sometimes has shortness of breath.  Earlier today his BP was 95/69, HR 72 and 02 sat 83% per his report.  This was about 2 hours after his morning medications.  He takes both Metoprolol 25 mg and Entresto 24-26 mg BID.   While on the phone he re-checked his BP and found it to be 112/65, HR 65. Pt reports he is having to cut his walks down with his dog due to both fatigue/SOB and being afraid he will fall.  He was walking several times a day, four or five blocks and now is maybe able to do 1 to 1 & 1/2 blocks.  He is using his cane while walking outside.  Inside the house he is holding onto furniture, walls and door knobs etc.  He denies any wt gain, edema, cough and no s/s of allergies or a cold.  He has a HX of At Fib but can not tell when he is in it or not.  Also has hx of CHF and AS. Last echo 09/23/2019 - EF 30-35% and moderate to severe aortic stenosis. Advised pt to continue to use extreme care with position changes, making sure to sitting and dangle after laying, stand slowly and hold onto something until he feels comfortable enough to walk.  Advised I will have Dr Eldridge Dace to review and he will be called back with further instructions.  He asked to be called back on [phone] - his cell #.

## 2021-02-22 NOTE — Telephone Encounter (Signed)
He needs an echo.  Also , stay well hydrated.  BMet, CBC and BNP also.  JV

## 2021-02-23 ENCOUNTER — Other Ambulatory Visit: Payer: Medicare Other

## 2021-02-24 ENCOUNTER — Other Ambulatory Visit: Payer: Self-pay

## 2021-02-24 ENCOUNTER — Other Ambulatory Visit: Payer: Medicare Other | Admitting: *Deleted

## 2021-02-24 ENCOUNTER — Ambulatory Visit (HOSPITAL_COMMUNITY): Payer: Medicare Other | Attending: Internal Medicine

## 2021-02-24 DIAGNOSIS — I5032 Chronic diastolic (congestive) heart failure: Secondary | ICD-10-CM

## 2021-02-24 DIAGNOSIS — R5383 Other fatigue: Secondary | ICD-10-CM

## 2021-02-24 DIAGNOSIS — I359 Nonrheumatic aortic valve disorder, unspecified: Secondary | ICD-10-CM

## 2021-02-24 DIAGNOSIS — Z79899 Other long term (current) drug therapy: Secondary | ICD-10-CM

## 2021-02-24 DIAGNOSIS — R0602 Shortness of breath: Secondary | ICD-10-CM

## 2021-02-24 DIAGNOSIS — R42 Dizziness and giddiness: Secondary | ICD-10-CM

## 2021-02-24 LAB — ECHOCARDIOGRAM COMPLETE
AR max vel: 0.91 cm2
AV Area VTI: 0.99 cm2
AV Area mean vel: 0.98 cm2
AV Mean grad: 36 mmHg
AV Peak grad: 51.6 mmHg
Ao pk vel: 3.59 m/s
Area-P 1/2: 2.76 cm2
S' Lateral: 2.7 cm

## 2021-02-25 LAB — BASIC METABOLIC PANEL
BUN/Creatinine Ratio: 12 (ref 10–24)
BUN: 23 mg/dL (ref 10–36)
CO2: 27 mmol/L (ref 20–29)
Calcium: 9.1 mg/dL (ref 8.6–10.2)
Chloride: 103 mmol/L (ref 96–106)
Creatinine, Ser: 1.85 mg/dL — ABNORMAL HIGH (ref 0.76–1.27)
Glucose: 114 mg/dL — ABNORMAL HIGH (ref 65–99)
Potassium: 4.7 mmol/L (ref 3.5–5.2)
Sodium: 142 mmol/L (ref 134–144)
eGFR: 34 mL/min/{1.73_m2} — ABNORMAL LOW (ref >59)

## 2021-02-25 LAB — CBC
Hematocrit: 40.2 % (ref 37.5–51.0)
Hemoglobin: 13.5 g/dL (ref 13.0–17.7)
MCH: 32.5 pg (ref 26.6–33.0)
MCHC: 33.6 g/dL (ref 31.5–35.7)
MCV: 97 fL (ref 79–97)
Platelets: 168 10*3/uL (ref 150–450)
RBC: 4.16 x10E6/uL (ref 4.14–5.80)
RDW: 12.5 % (ref 11.6–15.4)
WBC: 7 10*3/uL (ref 3.4–10.8)

## 2021-02-25 LAB — PRO B NATRIURETIC PEPTIDE: NT-Pro BNP: 459 pg/mL (ref 0–486)

## 2021-02-26 ENCOUNTER — Other Ambulatory Visit: Payer: Self-pay | Admitting: *Deleted

## 2021-05-05 ENCOUNTER — Other Ambulatory Visit: Payer: Self-pay | Admitting: Interventional Cardiology

## 2021-05-05 DIAGNOSIS — I4821 Permanent atrial fibrillation: Secondary | ICD-10-CM

## 2021-05-05 NOTE — Telephone Encounter (Signed)
Prescription refill request for Eliquis received. Indication: afib  Last office visit: Varanasi, 01/28/2021 Scr: 1.85, 02/24/2021 Age: 87 yo  Weight: 91.2 kg   Pt is on the correct dose of Eliquis per dosing criteria, prescription refill sent for Eliquis 2.5 mg BID.

## 2021-07-05 ENCOUNTER — Other Ambulatory Visit: Payer: Self-pay | Admitting: Interventional Cardiology

## 2021-07-07 MED ORDER — ENTRESTO 24-26 MG PO TABS: 1.0000 | ORAL_TABLET | Freq: Two times a day (BID) | ORAL | 1 refills | Status: DC

## 2021-07-12 ENCOUNTER — Encounter: Payer: Self-pay | Admitting: Internal Medicine

## 2021-07-12 ENCOUNTER — Ambulatory Visit
Admission: RE | Admit: 2021-07-12 | Discharge: 2021-07-12 | Disposition: A | Discharge status: Home / Self Care | Payer: Medicare Other | Source: Ambulatory Visit | Attending: Internal Medicine | Admitting: Internal Medicine

## 2021-07-12 ENCOUNTER — Other Ambulatory Visit: Payer: Self-pay

## 2021-07-12 ENCOUNTER — Ambulatory Visit (INDEPENDENT_AMBULATORY_CARE_PROVIDER_SITE_OTHER): Payer: Medicare Other | Admitting: Internal Medicine

## 2021-07-12 VITALS — BP 124/72 | HR 73 | Ht 69.0 in | Wt 188.2 lb

## 2021-07-12 DIAGNOSIS — I2510 Atherosclerotic heart disease of native coronary artery without angina pectoris: Secondary | ICD-10-CM

## 2021-07-12 DIAGNOSIS — I480 Paroxysmal atrial fibrillation: Secondary | ICD-10-CM

## 2021-07-12 DIAGNOSIS — I10 Essential (primary) hypertension: Secondary | ICD-10-CM

## 2021-07-12 DIAGNOSIS — I255 Ischemic cardiomyopathy: Secondary | ICD-10-CM

## 2021-07-12 DIAGNOSIS — I441 Atrioventricular block, second degree: Secondary | ICD-10-CM

## 2021-07-12 LAB — COMPREHENSIVE METABOLIC PANEL
ALT: 10 IU/L (ref 0–44)
AST: 19 IU/L (ref 0–40)
Albumin/Globulin Ratio: 2.2 (ref 1.2–2.2)
Albumin: 4.4 g/dL (ref 3.5–4.6)
Alkaline Phosphatase: 83 IU/L (ref 44–121)
BUN/Creatinine Ratio: 13 (ref 10–24)
BUN: 23 mg/dL (ref 10–36)
Bilirubin Total: 0.4 mg/dL (ref 0.0–1.2)
CO2: 26 mmol/L (ref 20–29)
Calcium: 9.3 mg/dL (ref 8.6–10.2)
Chloride: 101 mmol/L (ref 96–106)
Creatinine, Ser: 1.77 mg/dL — ABNORMAL HIGH (ref 0.76–1.27)
Globulin, Total: 2 g/dL (ref 1.5–4.5)
Glucose: 92 mg/dL (ref 65–99)
Potassium: 5.2 mmol/L (ref 3.5–5.2)
Sodium: 140 mmol/L (ref 134–144)
Total Protein: 6.4 g/dL (ref 6.0–8.5)
eGFR: 36 mL/min/{1.73_m2} — ABNORMAL LOW (ref >59)

## 2021-07-12 LAB — TSH: TSH: 4.25 u[IU]/mL (ref 0.450–4.500)

## 2021-07-12 NOTE — Progress Notes (Signed)
PCP: Tally Joe, MD Primary Cardiologist: Dr Eldridge Dace Primary EP:  Dr [name]  Gregory Mccormick is a 87 y.o. male who presents today for routine electrophysiology followup.  Since last being seen in our clinic, the patient reports doing very well.  Today, he denies symptoms of palpitations, chest pain, shortness of breath,  lower extremity edema, dizziness, presyncope, or syncope.  The patient is otherwise without complaint today.   Past Medical History:  Diagnosis Date   Anemia, unspecified    Aortic stenosis    Arthritis    "right knee" (03/28/2013   CAD in native artery    takes Xarelto daily   Cardiomyopathy (HCC)    EF 40%:  ECHO 11/25/2015 EF 55%-60%   Carpal tunnel syndrome, bilateral    Chronic systolic CHF (congestive heart failure) (HCC)    CKD (chronic kidney disease), stage II    Epistaxis    Essential hypertension, benign    takes Metoprolol daily   H/O hiatal hernia    History of kidney stones    Hyperlipidemia    takes Lipitor daily   Nonspecific abnormal unspecified cardiovascular function study    OSA on CPAP    wears CPAP 02/03/16   Pacemaker    MDT   PAF (paroxysmal atrial fibrillation) (HCC)    Paroxysmal ventricular tachycardia (HCC)    PONV (postoperative nausea and vomiting)    PVC's (premature ventricular contractions)    Vitamin D deficiency    Past Surgical History:  Procedure Laterality Date   CARDIAC CATHETERIZATION  03/21/2013   CARDIOVERSION N/A 10/09/2018   Procedure: CARDIOVERSION;  Surgeon: Wendall Stade, MD;  [facility];  Service: Cardiovascular;  Laterality: N/A;   CARPAL TUNNEL RELEASE Right 02/05/2016   Procedure: RIGHT LIMITED OPEN CARPAL TUNNEL RELEASE;  Surgeon: Dominica Severin, MD;  Location: [facility];  Service: Orthopedics;  Laterality: Right;   CARPAL TUNNEL RELEASE Left 04/21/2016   Procedure: CARPAL TUNNEL RELEASE;  Surgeon: Dominica Severin, MD;  Location: [facility];  Service: Orthopedics;  Laterality: Left;   CATARACT  EXTRACTION W/ INTRAOCULAR LENS  IMPLANT, BILATERAL Bilateral 2000's   COLONOSCOPY     CORONARY ANGIOPLASTY WITH STENT PLACEMENT  03/28/2013   "3 stents" (03/28/2013)   HIP ARTHROPLASTY Right 04/30/2019   Procedure: ARTHROPLASTY BIPOLAR HIP (HEMIARTHROPLASTY);  Surgeon: Durene Romans, MD;  Location: [facility];  Service: Orthopedics;  Laterality: Right;   INSERT / REPLACE / REMOVE PACEMAKER     KNEE CARTILAGE SURGERY Left 1972   "cut it open" (03/28/2013)   KNEE CARTILAGE SURGERY Right ~ 1977   "cut it open" (03/28/2013)   PACEMAKER INSERTION  10/05/10   MDT Adapta L implanted by Dr [name] for mobitz II second degree AV block   PERCUTANEOUS CORONARY ROTOBLATOR INTERVENTION (PCI-R) N/A 03/28/2013   Procedure: PERCUTANEOUS CORONARY ROTOBLATOR INTERVENTION (PCI-R);  Surgeon: Corky Crafts, MD;  Location: [facility];  Service: Cardiovascular;  Laterality: N/A;   SEPTOPLASTY N/A 02/05/2016   Procedure: SEPTOPLASTY WITH INTRANASAL SKIN GRAFT;  Surgeon: Drema Halon, MD;  [facility];  Service: ENT;  Laterality: N/A;   SHOULDER HEMI-ARTHROPLASTY Right 1987   "got hit by sled & dragged down sheet > 200 feet; tore shoulder up" (03/28/2013)   SHOULDER SURGERY Right 1970's   "pulled muscle apart; sewed it back together" (03/28/2013   TOTAL KNEE ARTHROPLASTY Right 2007   TOTAL KNEE ARTHROPLASTY  11/26/2012   Procedure: TOTAL KNEE ARTHROPLASTY;  Surgeon: Loanne Drilling, MD;  Location:  [facility];  Service: Orthopedics;  Laterality: Left;   TRANSURETHRAL RESECTION OF PROSTATE N/A 12/27/2017   Procedure: TRANSURETHRAL RESECTION OF THE PROSTATE (TURP);  Surgeon: Crista Elliot, MD;  Location: [facility];  Service: Urology;  Laterality: N/A;    ROS- all systems are reviewed and negative except as per HPI above  Current Outpatient Medications  Medication Sig Dispense Refill   amiodarone (PACERONE) 200 MG tablet Take 1 tablet (200 mg total) by mouth daily. 90 tablet 2   apixaban  (ELIQUIS) 2.5 MG TABS tablet TAKE 1 TABLET BY MOUTH  TWICE DAILY 180 tablet 1   atorvastatin (LIPITOR) 10 MG tablet TAKE 1 TABLET BY MOUTH DAILY AT 6 PM. 90 tablet 2   furosemide (LASIX) 20 MG tablet Take 1 tablet (20 mg total) by mouth daily. 90 tablet 2   latanoprost (XALATAN) 0.005 % ophthalmic solution SMARTSIG:In Eye(s)     sacubitril-valsartan (ENTRESTO) 24-26 MG Take 1 tablet by mouth 2 (two) times daily. 180 tablet 1   nitroGLYCERIN (NITROSTAT) 0.4 MG SL tablet Place 1 tablet (0.4 mg total) under the tongue every 5 (five) minutes as needed for chest pain. 25 tablet 5   Current Facility-Administered Medications  Medication Dose Route Frequency Provider Last Rate Last Admin   betamethasone acetate-betamethasone sodium phosphate (CELESTONE) injection 3 mg  3 mg Intramuscular Once Felecia Shelling, DPM        Physical Exam: Vitals:   07/12/21 0855  BP: 124/72  Pulse: 73  SpO2: 97%  Weight: 188 lb 3.2 oz (85.4 kg)  Height: 5\' 9"  (1.753 m)    GEN- The patient is well appearing, alert and oriented x 3 today.   Head- normocephalic, atraumatic Eyes-  Sclera clear, conjunctiva pink Ears- hearing intact Oropharynx- clear Lungs- Clear to ausculation bilaterally, normal work of breathing Chest- pacemaker pocket is well healed Heart- Regular rate and rhythm, no murmurs, rubs or gallops, PMI not laterally displaced GI- soft, NT, ND, + BS Extremities- no clubbing, cyanosis, or edema  Pacemaker interrogation- reviewed in detail today,  See PACEART report  ekg tracing ordered today is personally reviewed and shows atrial paced, PR 344 msec, QRS 112 msec, PVCs  Assessment and Plan:  1. Symptomatic second degree heart block Normal pacemaker function See Pace Art report No changes today he is not device dependant today  2. Afib Asymptomatic Burden <0.1% on amiodarone Chads2vasc score is 5.  He is on renally adjusted eliquis.  Labs 02/24/21 reviewed No changes indicated Check lfts,  tfts and cxr at this time while on amiodarone Consider reducing amiodarone to 100mg  daily on return  3.  CAD, chronic diastolic dysfunction Stable Euvolemic No ischemic symptoms Echo 02/24/21 reviewed,  EF 60% No change required today  4. HTN Stable No change required today   Risks, benefits and potential toxicities for medications prescribed and/or refilled reviewed with patient today.   Return to see EP APP annually  MD, [facility] 07/12/2021 9:25 AM

## 2021-07-12 NOTE — Patient Instructions (Signed)
Medication Instructions:  Your physician recommends that you continue on your current medications as directed. Please refer to the Current Medication list given to you today.  Labwork: Cmet, TSH  Testing/Procedures: A chest x-ray takes a picture of the organs and structures inside the chest, including the heart, lungs, and blood vessels. This test can show several things, including, whether the heart is enlarges; whether fluid is building up in the lungs; and whether pacemaker / defibrillator leads are still in place.   Follow-Up: Your physician wants you to follow-up in: 12 months with   one of the following Advanced Practice Providers on your designated Care Team:    Francis Dowse, [place] Casimiro Needle "[name]" [place], [place]   You will receive a reminder letter in the mail two months in advance. If you don't receive a letter, please call our office to schedule the follow-up appointment.  Remote monitoring is used to monitor your Pacemaker or ICD from home. This monitoring reduces the number of office visits required to check your device to one time per year. It allows [place] to keep an eye on the functioning of your device to ensure it is working properly. You are scheduled for a device check from home on 07/19/21. You may send your transmission at any time that day. If you have a wireless device, the transmission will be sent automatically. After your physician reviews your transmission, you will receive a postcard with your next transmission date.  Any Other Special Instructions Will Be Listed Below (If Applicable).  If you need a refill on your cardiac medications before your next appointment, please call your pharmacy.   Chest X-ray Instructions:    1. You may have this done at the [facility], located in the [facility] on the 1st floor.    2. You do no have to have an appointment.    3. [address] [place], [place] [address]        [phone]         Monday - Friday  8:00 am - 5:00 pm

## 2021-09-28 ENCOUNTER — Other Ambulatory Visit: Payer: Self-pay | Admitting: Interventional Cardiology

## 2021-12-05 ENCOUNTER — Other Ambulatory Visit: Payer: Self-pay | Admitting: Interventional Cardiology

## 2021-12-05 DIAGNOSIS — I4821 Permanent atrial fibrillation: Secondary | ICD-10-CM

## 2021-12-06 NOTE — Telephone Encounter (Signed)
Pt last saw Dr Rayann Heman 07/12/21, last labs 07/12/21 Creat 1.77, age 88, weight 85.4kg, based on specified criteria pt is on appropriate dosage of Eliquis 2.5mg  BID for afib.  Will refill rx.

## 2021-12-29 ENCOUNTER — Telehealth: Payer: Self-pay | Admitting: Internal Medicine

## 2021-12-29 NOTE — Telephone Encounter (Signed)
Returned call to Pt. ? ?Per Pt he is having some shortness of breath with exertion. ? ?He has not tried taking an extra fluid pill to see if this would help. ? ?He will try taking an extra fluid pill (per Dr. Hassell Done last note) and see if this improves his breathing. ? ?Advised would discuss with Dr. Hassell Done nurse to see if he can be seen sooner than May followup scheduled. ? ?Pt thanked nurse for assistance. ?

## 2021-12-29 NOTE — Telephone Encounter (Signed)
New Message: ? ? ? ? ?Katie Nurse Asst at Dr Merita Norton office called. She said patient had a phone visit with Linward Headland Clinical Pharmacist today. He said patient was complaining of shortness of breath when walking his dog.  Scott wants to know if patient should be seen? ? ?Pt c/o Shortness Of Breath: STAT if SOB developed within the last 24 hours or pt is noticeably SOB on the phone- D Swayne's Office called to say that patient have been having shortness of breath- he reported that today on his Video Visit  ? ?1. Are you currently SOB (can you hear that pt is SOB on the phone)? Doctor office called ? ?2. How long have you been experiencing SOB?  ? ?3. Are you SOB when sitting or when up moving around? When moving around ? ?4. Are you currently experiencing any other symptoms?  ? ?

## 2021-12-30 NOTE — Telephone Encounter (Signed)
I placed call to patient to see how he was feeling and schedule sooner appointment.  Voicemail not set up on mobile number.  Message left on home number to call office ?

## 2021-12-31 NOTE — Telephone Encounter (Signed)
Patient returning call.

## 2021-12-31 NOTE — Telephone Encounter (Signed)
Spoke to pt. ?Pt said he did have "a little" relief with the extra Lasix, but sob is still there. ?Pt scheduled at next available with [facility] on 3/14. ?Pt will call office if worsens prior to appt. ?Pt appreciates the return call and agreeable to plan. ?

## 2022-01-09 NOTE — Progress Notes (Unsigned)
Cardiology Office Note   Date:  01/09/2022   ID:  Gregory Mccormick, DOB [birth year], MRN [id number]  PCP:  Gregory Joe, MD    No chief complaint on file.  CAD  Wt Readings from Last 3 Encounters:  07/12/21 188 lb 3.2 oz (85.4 kg)  01/28/21 201 lb (91.2 kg)  06/18/20 194 lb (88 kg)       History of Present Illness: Gregory Mccormick is a 88 y.o. male   with history of CAD s/p stents to LAD and circumflex in 2014, PAF on Xarelto, [employer]tr[employer] permanent pacemaker for second-degree heart block, OSA no longer using CPAP, hypertension, hyperlipidemia, probable CKD stage II-III, cardiomyopathy and hiatal hernia who presents to f/u afib/cardiomyopathy.   He had remote PCI in 2014 and previously had normal LV function.    He was hospitalized in 08/2018 with atypical chest pain. Troponins were flat and CTA was negative for PE. However, his EF was noted to be down - EF 30-35%, diffuse HK, moderate AS, mild MR, mod LAE, severely dilated RA, mildly dilated RV, mild TR, PASP 46, elevated CVP.   He Was hospitalized and underwent right hip Hemiarthroplasty 04/30/2019 without complications. Patient had no trouble with heart failure or chest pain while in the hospital.   His wife Gregory Mccormick passed away in 05/26/2019.   I saw patient 05/15/2019 with worsening leg edema right greater than left.  Edema resolved with diuresis.  After that time, he felt well.   Echo in 01/2021 showed: "The aortic valve is abnormal. There is severe calcifcation of the  aortic valve. Aortic valve regurgitation is not visualized. Severe aortic  valve stenosis. Aortic valve mean gradient measures 36.0 mmHg.  Dimensionless index is 0.22. Based on this  finding and valve appearance, aortic stenosis is likely severe.   2. Left ventricular ejection fraction, by estimation, is 60 to 65%. The  left ventricle has normal function. The left ventricle has no regional  wall motion abnormalities. There is mild left ventricular hypertrophy.   Left ventricular diastolic parameters  were normal.   3. Right ventricular systolic function is normal. The right ventricular  size is normal. There is mildly elevated pulmonary artery systolic  pressure. The estimated right ventricular systolic pressure is 40.7 mmHg.   4. Left atrial size was moderately dilated.   5. Right atrial size was mildly dilated.   6. The mitral valve is degenerative. Small flail segment on posterior  mitral leaflet . At least moderate, eccentric anteriorly directed jet of  mitral valve regurgitation with notable splay artifact. No evidence of  mitral stenosis.   7. Tricuspid valve regurgitation is mild to moderate.   8. The inferior vena cava is normal in size with greater than 50%  respiratory variability, suggesting right atrial pressure of 3 mmHg.   Comparison(s): A prior study was performed on 09/23/2019. Prior images  reviewed side by side. LVEF has improved, MR has likely increased. "  Had some mild SHOB in 12/2021.  Mild improvement with extra Lasix.  Had discussed TAVR, mitraclip w/u if [facility] worsened.   Past Medical History:  Diagnosis Date   Anemia, unspecified    Aortic stenosis    Arthritis    "right knee" (03/28/2013   CAD in native artery    takes Xarelto daily   Cardiomyopathy (HCC)    EF 40%:  ECHO 11/25/2015 EF 55%-60%   Carpal tunnel syndrome, bilateral    Chronic systolic CHF (congestive heart failure) (HCC)  CKD (chronic kidney disease), stage II    Epistaxis    Essential hypertension, benign    takes Metoprolol daily   H/O hiatal hernia    History of kidney stones    Hyperlipidemia    takes Lipitor daily   Nonspecific abnormal unspecified cardiovascular function study    OSA on CPAP    wears CPAP 02/03/16   Pacemaker    MDT   PAF (paroxysmal atrial fibrillation) (HCC)    Paroxysmal ventricular tachycardia (HCC)    PONV (postoperative nausea and vomiting)    PVC's (premature ventricular contractions)    Vitamin D deficiency      Past Surgical History:  Procedure Laterality Date   CARDIAC CATHETERIZATION  03/21/2013   CARDIOVERSION N/A 10/09/2018   Procedure: CARDIOVERSION;  Surgeon: Wendall Stade, MD;  [facility];  Service: Cardiovascular;  Laterality: N/A;   CARPAL TUNNEL RELEASE Right 02/05/2016   Procedure: RIGHT LIMITED OPEN CARPAL TUNNEL RELEASE;  Surgeon: Dominica Severin, MD;  [facility]: [facility];  Service: Orthopedics;  Laterality: Right;   CARPAL TUNNEL RELEASE Left 04/21/2016   Procedure: CARPAL TUNNEL RELEASE;  Surgeon: Dominica Severin, MD;  [facility];  Service: Orthopedics;  Laterality: Left;   CATARACT EXTRACTION W/ INTRAOCULAR LENS  IMPLANT, BILATERAL Bilateral 2000's   COLONOSCOPY     CORONARY ANGIOPLASTY WITH STENT PLACEMENT  03/28/2013   "3 stents" (03/28/2013)   HIP ARTHROPLASTY Right 04/30/2019   Procedure: ARTHROPLASTY BIPOLAR HIP (HEMIARTHROPLASTY);  Surgeon: Durene Romans, MD;  Location: [facility];  Service: Orthopedics;  Laterality: Right;   INSERT / REPLACE / REMOVE PACEMAKER     KNEE CARTILAGE SURGERY Left 1972   "cut it open" (03/28/2013)   KNEE CARTILAGE SURGERY Right ~ 1977   "cut it open" (03/28/2013)   PACEMAKER INSERTION  10/05/10   MDT Adapta L implanted by Dr [name] for mobitz II second degree AV block   PERCUTANEOUS CORONARY ROTOBLATOR INTERVENTION (PCI-R) N/A 03/28/2013   Procedure: PERCUTANEOUS CORONARY ROTOBLATOR INTERVENTION (PCI-R);  Surgeon: Corky Crafts, MD;  Location: [facility] CATH LAB;  Service: Cardiovascular;  Laterality: N/A;   SEPTOPLASTY N/A 02/05/2016   Procedure: SEPTOPLASTY WITH INTRANASAL SKIN GRAFT;  Surgeon: Drema Halon, MD;  Location: [facility];  Service: ENT;  Laterality: N/A;   SHOULDER HEMI-ARTHROPLASTY Right 1987   "got hit by sled & dragged down sheet > 200 feet; tore shoulder up" (03/28/2013)   SHOULDER SURGERY Right 1970's   "pulled muscle apart; sewed it back together" (03/28/2013   TOTAL KNEE ARTHROPLASTY Right 2007   TOTAL KNEE  ARTHROPLASTY  11/26/2012   Procedure: TOTAL KNEE ARTHROPLASTY;  Surgeon: Loanne Drilling, MD;  Location: [facility];  Service: Orthopedics;  Laterality: Left;   TRANSURETHRAL RESECTION OF PROSTATE N/A 12/27/2017   Procedure: TRANSURETHRAL RESECTION OF THE PROSTATE (TURP);  Surgeon: Crista Elliot, MD;  Location: [facility];  Service: Urology;  Laterality: N/A;     Current Outpatient Medications  Medication Sig Dispense Refill   amiodarone (PACERONE) 200 MG tablet TAKE 1 TABLET BY MOUTH  DAILY 90 tablet 0   apixaban (ELIQUIS) 2.5 MG TABS tablet TAKE 1 TABLET BY MOUTH  TWICE DAILY 180 tablet 1   atorvastatin (LIPITOR) 10 MG tablet TAKE 1 TABLET BY MOUTH  DAILY AT 6 PM. 90 tablet 3   furosemide (LASIX) 20 MG tablet TAKE 1 TABLET BY MOUTH DAILY 90 tablet 3   latanoprost (XALATAN) 0.005 % ophthalmic solution SMARTSIG:In Eye(s)     nitroGLYCERIN (  NITROSTAT) 0.4 MG SL tablet Place 1 tablet (0.4 mg total) under the tongue every 5 (five) minutes as needed for chest pain. 25 tablet 5   sacubitril-valsartan (ENTRESTO) 24-26 MG Take 1 tablet by mouth 2 (two) times daily. 180 tablet 1   Current Facility-Administered Medications  Medication Dose Route Frequency Provider Last Rate Last Admin   betamethasone acetate-betamethasone sodium phosphate (CELESTONE) injection 3 mg  3 mg Intramuscular Once Felecia Shelling, DPM        Allergies:   Penicillins, Other, and Hydromorphone hcl    Social History:  The patient  reports that he quit smoking about 48 years ago. His smoking use included cigarettes. He has a 26.00 pack-year smoking history. He has never used smokeless tobacco. He reports that he does not drink alcohol and does not use drugs.   Family History:  The patient's ***family history includes CAD (age of onset: 64) in his brother; Cancer in an other family member; Heart disease in an other family member; Stroke in his brother.    ROS:  Please see the history of present illness.    Otherwise, review of systems are positive for ***.   All other systems are reviewed and negative.    PHYSICAL EXAM: VS:  There were no vitals taken for this visit. , BMI There is no height or weight on file to calculate BMI. GEN: Well nourished, well developed, in no acute distress HEENT: normal Neck: no JVD, carotid bruits, or masses Cardiac: ***RRR; no murmurs, rubs, or gallops,no edema  Respiratory:  clear to auscultation bilaterally, normal work of breathing GI: soft, nontender, nondistended, + BS MS: no deformity or atrophy Skin: warm and dry, no rash Neuro:  Strength and sensation are intact Psych: euthymic mood, full affect   EKG:   The ekg ordered today demonstrates ***   Recent Labs: 02/24/2021: Hemoglobin 13.5; NT-Pro BNP 459; Platelets 168 07/12/2021: ALT 10; BUN 23; Creatinine, Ser 1.77; Potassium 5.2; Sodium 140; TSH 4.250   Lipid Panel    Component Value Date/Time   CHOL 108 04/30/2019 0518   CHOL 108 11/17/2017 0848   TRIG 29 04/30/2019 0518   HDL 52 04/30/2019 0518   HDL 56 11/17/2017 0848   CHOLHDL 2.1 04/30/2019 0518   VLDL 6 04/30/2019 0518   LDLCALC 50 04/30/2019 0518   LDLCALC 39 11/17/2017 0848     Other studies Reviewed: Additional studies/ records that were reviewed today with results demonstrating: ***.   ASSESSMENT AND PLAN:  CAD:  Aortic stenosis: Severe in 01/2021.  Increased mitral regurgitation as well noted in 2022.  Chronic systolic heart failure: resolved in 01/2021.  AFib: Xarelto for stroke prevention.  Pacer:   Current medicines are reviewed at length with the patient today.  The patient concerns regarding his medicines were addressed.  The following changes have been made:  No change***  Labs/ tests ordered today include: *** No orders of the defined types were placed in this encounter.   Recommend 150 minutes/week of aerobic exercise Low fat, low carb, high fiber diet recommended  Disposition:   FU in  ***   Signed, Lance Muss, MD  01/09/2022 6:33 PM    [facility] [address] [place], [place], [place]  [address] Phone: [phone]; Fax: [phone]

## 2022-01-11 ENCOUNTER — Encounter: Payer: Self-pay | Admitting: Interventional Cardiology

## 2022-01-11 ENCOUNTER — Ambulatory Visit (INDEPENDENT_AMBULATORY_CARE_PROVIDER_SITE_OTHER): Payer: Medicare Other | Admitting: Interventional Cardiology

## 2022-01-11 ENCOUNTER — Other Ambulatory Visit: Payer: Self-pay

## 2022-01-11 VITALS — BP 100/68 | HR 61 | Ht 68.0 in | Wt 192.0 lb

## 2022-01-11 DIAGNOSIS — I5032 Chronic diastolic (congestive) heart failure: Secondary | ICD-10-CM

## 2022-01-11 DIAGNOSIS — R0602 Shortness of breath: Secondary | ICD-10-CM

## 2022-01-11 DIAGNOSIS — I10 Essential (primary) hypertension: Secondary | ICD-10-CM | POA: Diagnosis not present

## 2022-01-11 DIAGNOSIS — I480 Paroxysmal atrial fibrillation: Secondary | ICD-10-CM

## 2022-01-11 DIAGNOSIS — I359 Nonrheumatic aortic valve disorder, unspecified: Secondary | ICD-10-CM

## 2022-01-11 DIAGNOSIS — I2510 Atherosclerotic heart disease of native coronary artery without angina pectoris: Secondary | ICD-10-CM | POA: Diagnosis not present

## 2022-01-11 NOTE — Patient Instructions (Signed)
Medication Instructions:  ? ? ?Your physician recommends that you continue on your current medications as directed. Please refer to the Current Medication list given to you today. ? ? ?*If you need a refill on your cardiac medications before your next appointment, please call your pharmacy* ? ? ?Lab Work:  BMET  BNP AND CBC TODAY  ? ?If you have labs (blood work) drawn today and your tests are completely normal, you will receive your results only by: ?MyChart Message (if you have MyChart) OR ?A paper copy in the mail ?If you have any lab test that is abnormal or we need to change your treatment, we will call you to review the results. ? ? ?Testing/Procedures: Your physician has requested that you have an echocardiogram. Echocardiography is a painless test that uses sound waves to create images of your heart. It provides your doctor with information about the size and shape of your heart and how well your heart?s chambers and valves are working. This procedure takes approximately one hour. There are no restrictions for this procedure. ? ? ? ?Follow-Up: ?At [facility], you and your health needs are our priority.  As part of our continuing mission to provide you with exceptional heart care, we have created designated Provider Care Teams.  These Care Teams include your primary Cardiologist (physician) and Advanced Practice Providers (APPs -  Physician Assistants and Nurse Practitioners) who all work together to provide you with the care you need, when you need it. ? ?We recommend signing up for the patient portal called "MyChart".  Sign up information is provided on this After Visit Summary.  MyChart is used to connect with patients for Virtual Visits (Telemedicine).  Patients are able to view lab/test results, encounter notes, upcoming appointments, etc.  Non-urgent messages can be sent to your provider as well.   ?To learn more about what you can do with MyChart, go to [email or web address].   ? ?Your next  appointment:   ?3 month(s) ? ?The format for your next appointment:   ?In Person ? ?Provider:   ?Lance Muss, MD   ? ? ?Other Instructions ? ?

## 2022-01-12 LAB — CBC
Hematocrit: 44.4 % (ref 37.5–51.0)
Hemoglobin: 14.4 g/dL (ref 13.0–17.7)
MCH: 31.3 pg (ref 26.6–33.0)
MCHC: 32.4 g/dL (ref 31.5–35.7)
MCV: 97 fL (ref 79–97)
Platelets: 170 10*3/uL (ref 150–450)
RBC: 4.6 x10E6/uL (ref 4.14–5.80)
RDW: 12.5 % (ref 11.6–15.4)
WBC: 6.6 10*3/uL (ref 3.4–10.8)

## 2022-01-12 LAB — BASIC METABOLIC PANEL
BUN/Creatinine Ratio: 12 (ref 10–24)
BUN: 21 mg/dL (ref 10–36)
CO2: 27 mmol/L (ref 20–29)
Calcium: 9.5 mg/dL (ref 8.6–10.2)
Chloride: 101 mmol/L (ref 96–106)
Creatinine, Ser: 1.71 mg/dL — ABNORMAL HIGH (ref 0.76–1.27)
Glucose: 96 mg/dL (ref 70–99)
Potassium: 4.9 mmol/L (ref 3.5–5.2)
Sodium: 141 mmol/L (ref 134–144)
eGFR: 38 mL/min/{1.73_m2} — ABNORMAL LOW (ref >59)

## 2022-01-12 LAB — PRO B NATRIURETIC PEPTIDE: NT-Pro BNP: 636 pg/mL — ABNORMAL HIGH (ref 0–486)

## 2022-01-13 ENCOUNTER — Other Ambulatory Visit: Payer: Self-pay | Admitting: *Deleted

## 2022-01-13 MED ORDER — FUROSEMIDE 40 MG PO TABS: 40.0000 mg | ORAL_TABLET | Freq: Every day | ORAL | 3 refills | Status: DC

## 2022-01-14 ENCOUNTER — Telehealth: Payer: Self-pay

## 2022-01-14 DIAGNOSIS — I359 Nonrheumatic aortic valve disorder, unspecified: Secondary | ICD-10-CM

## 2022-01-14 NOTE — Telephone Encounter (Signed)
-----   Message from Tonny Bollman, MD sent at 01/13/2022 10:43 PM EDT ----- ?Happy to see him. Looked at last year's echo and your notes. Think he will be a good TAVR candidate.  ?----- Message ----- ?From: Corky Crafts, MD ?Sent: 01/13/2022   1:50 PM EDT ?To: Dossie Arbour, RN, Tonny Bollman, MD ? ?Renal function stable. Increase Lasix to 40 mg daily.    Sx are mild at this time and may resolve with the increased diuretic.  COntinue with plans for repeat echo.  Plan for BMet in 1 week ? ?Kathlene November, ?He has both AS and MR by the last echo.  Sx are worse but he is an active 88 year old and I think would be a reasonable TAVR candidate if needed. Maybe MR would improve with TAVR alone.  ? ?

## 2022-01-14 NOTE — Telephone Encounter (Signed)
Advised patient that he has been referred to structural heart clinic. Patient verbalized understanding.  ?

## 2022-01-23 NOTE — Progress Notes (Addendum)
? ? ?Patient ID: ?Gregory Mccormick ?MRN: [id number] ?DOB/AGE: 12/24/1928 88 y.o. ? ?Primary Care Physician:Swayne, David, MD ?Primary Cardiologist: Varanasi, Jay, MD ? ? ?FOCUSED CARDIOVASCULAR PROBLEM LIST:   ?1.  Aortic stenosis with mean gradient of 36 mmHg on echocardiogram 2022 ?2.  Degenerative mitral regurgitation with at least moderate mitral regurgitation on echocardiogram 2022 ?3.  Coronary artery disease status post LAD and left circumflex stenting in 2014 ?3.  Paroxysmal atrial fibrillation on Xarelto ?4.  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  Hypertension ?6.  Hyperlipidemia ?7.  Stage 3 chronic kidney disease with a creatinine of 1.7 and a GFR in the 30s ? ? ?HISTORY OF PRESENT ILLNESS: ?The patient is a 88 y.o. male with the indicated medical history here for recommendations regarding his probably valvular disease.  He was seen by Dr. Varanasi recently and the patient had noted increasing shortness of breath.  An echocardiogram done last year demonstrated severe aortic stenosis and possible significant mitral regurgitation.  The patient is here with his son.  The patient is a very functional 89-year-old.  He does a lot of physical labor on his property.  This includes working on his steel roof, riding on a tractor, and mowing his lawn.  He has noticed over the last few months that he is developed increasing shortness of breath.  He will have to stop and take a deep breath when he is walking his dog.  He tells me that this is quite bothersome to him because he has to stop frequently and sometimes he does not know when he needs to stop.  Makes him unsure as to what activities he can do before he gets into perhaps trouble.  He denies any exertional angina, severe bleeding but he does have nuisance bruising.  He does have some presyncope when he goes from sitting to standing quickly.  He has had no frank syncope.  He is required no emergency room visits or  hospitalizations. ? ?He reports very good dental health and sees a dentist every 6 months. ? ?Past Medical History:  ?Diagnosis Date  ? Anemia, unspecified   ? Aortic stenosis   ? Arthritis   ? "right knee" (03/28/2013  ? CAD in native artery   ? takes Xarelto daily  ? Cardiomyopathy (HCC)   ? EF 40%:  ECHO 11/25/2015 EF 55%-60%  ? Carpal tunnel syndrome, bilateral   ? Chronic systolic CHF (congestive heart failure) (HCC)   ? CKD (chronic kidney disease), stage II   ? Epistaxis   ? Essential hypertension, benign   ? takes Metoprolol daily  ? H/O hiatal hernia   ? History of kidney stones   ? Hyperlipidemia   ? takes Lipitor daily  ? Nonspecific abnormal unspecified cardiovascular function study   ? OSA on CPAP   ? wears CPAP 02/03/16  ? Pacemaker   ? MDT  ? PAF (paroxysmal atrial fibrillation) (HCC)   ? Paroxysmal ventricular tachycardia   ? PONV (postoperative nausea and vomiting)   ? PVC's (premature ventricular contractions)   ? Vitamin D deficiency   ?  ?Past Surgical History:  ?Procedure Laterality Date  ? CARDIAC CATHETERIZATION  03/21/2013  ? CARDIOVERSION N/A 10/09/2018  ? Procedure: CARDIOVERSION;  Surgeon: Nishan, Peter C, MD;  [facility]: [facility];  Service: Cardiovascular;  Laterality: N/A;  ? CARPAL TUNNEL RELEASE Right 02/05/2016  ? Procedure: RIGHT LIMITED OPEN CARPAL TUNNEL RELEASE;  Surgeon: William Gramig, MD;  Location: [facility];  Service: Orthopedics;    Laterality: Right;  ? CARPAL TUNNEL RELEASE Left 04/21/2016  ? Procedure: CARPAL TUNNEL RELEASE;  Surgeon: William Gramig, MD;  Location: [facility];  Service: Orthopedics;  Laterality: Left;  ? CATARACT EXTRACTION W/ INTRAOCULAR LENS  IMPLANT, BILATERAL Bilateral 2000's  ? COLONOSCOPY    ? CORONARY ANGIOPLASTY WITH STENT PLACEMENT  03/28/2013  ? "3 stents" (03/28/2013)  ? HIP ARTHROPLASTY Right 04/30/2019  ? Procedure: ARTHROPLASTY BIPOLAR HIP (HEMIARTHROPLASTY);  Surgeon: Olin, Matthew, MD;  Location: [facility];  Service: Orthopedics;  Laterality: Right;  ? INSERT /  REPLACE / REMOVE PACEMAKER    ? KNEE CARTILAGE SURGERY Left 1972  ? "cut it open" (03/28/2013)  ? KNEE CARTILAGE SURGERY Right ~ 1977  ? "cut it open" (03/28/2013)  ? PACEMAKER INSERTION  10/05/10  ? 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 PERCUTANEOUS CORONARY ROTOBLATOR INTERVENTION (PCI-R) N/A 03/28/2013  ? Procedure: PERCUTANEOUS CORONARY ROTOBLATOR INTERVENTION (PCI-R);  Surgeon: Jayadeep S Varanasi, MD;  [facility];  Service: Cardiovascular;  Laterality: N/A;  ? SEPTOPLASTY N/A 02/05/2016  ? Procedure: SEPTOPLASTY WITH INTRANASAL SKIN GRAFT;  Surgeon: Christopher E Newman, MD;  [facility]: [facility];  Service: ENT;  Laterality: N/A;  ? SHOULDER HEMI-ARTHROPLASTY Right 1987  ? "got hit by sled & dragged down sheet > 200 feet; tore shoulder up" (03/28/2013)  ? SHOULDER SURGERY Right 1970's  ? "pulled muscle apart; sewed it back together" (03/28/2013  ? TOTAL KNEE ARTHROPLASTY Right 2007  ? TOTAL KNEE ARTHROPLASTY  11/26/2012  ? Procedure: TOTAL KNEE ARTHROPLASTY;  Surgeon: Frank V Aluisio, MD;  Location: [facility];  Service: Orthopedics;  Laterality: Left;  ? TRANSURETHRAL RESECTION OF PROSTATE N/A 12/27/2017  ? Procedure: TRANSURETHRAL RESECTION OF THE PROSTATE (TURP);  Surgeon: Bell, Eugene D III, MD;  Location: [facility];  Service: Urology;  Laterality: N/A;  ?  ?Family History  ?Problem Relation Age of Onset  ? CAD Brother 75  ? Stroke Brother   ? Cancer Other   ? Heart disease Other   ? Heart attack Neg Hx   ? Hypertension Neg Hx   ?  ?Social History  ? ?Socioeconomic History  ? Marital status: Married  ?  Spouse name: Not on file  ? Number of children: Not on file  ? Years of education: Not on file  ? Highest education level: Not on file  ?Occupational History  ? Occupation: retired  ?  Employer: [employer]  ?Tobacco Use  ? Smoking status: Former  ?  Packs/day: 1.00  ?  Years: 26.00  ?  Pack years: 26.00  ?  Types: Cigarettes  ?  Quit date: 10/31/1973  ?   Years since quitting: 48.2  ? Smokeless tobacco: Never  ?Vaping Use  ? Vaping Use: Never used  ?Substance and Sexual Activity  ? Alcohol use: No  ? Drug use: No  ? Sexual activity: Never  ?Other Topics Concern  ? Not on file  ?Social History Narrative  ? Not on file  ? ?Social Determinants of Health  ? ?Financial Resource Strain: Not on file  ?Food Insecurity: Not on file  ?Transportation Needs: Not on file  ?Physical Activity: Not on file  ?Stress: Not on file  ?Social Connections: Not on file  ?Intimate Partner Violence: Not on file  ?  ? ?Prior to Admission medications   ?Medication Sig Start Date End Date Taking? Authorizing Provider  ?amiodarone (PACERONE) 200 MG tablet TAKE 1 TABLET   BY MOUTH  DAILY 12/06/21   Varanasi, Jayadeep S, MD  ?apixaban (ELIQUIS) 2.5 MG TABS tablet TAKE 1 TABLET BY MOUTH  TWICE DAILY 12/06/21   Allred, James, MD  ?atorvastatin (LIPITOR) 10 MG tablet TAKE 1 TABLET BY MOUTH  DAILY AT 6 PM. 12/06/21   Varanasi, Jayadeep S, MD  ?furosemide (LASIX) 40 MG tablet Take 1 tablet (40 mg total) by mouth daily. 01/13/22   Varanasi, Jayadeep S, MD  ?latanoprost (XALATAN) 0.005 % ophthalmic solution SMARTSIG:In Eye(s) 11/28/20   [provider]  ?nitroGLYCERIN (NITROSTAT) 0.4 MG SL tablet Place 1 tablet (0.4 mg total) under the tongue every 5 (five) minutes as needed for chest pain. 12/07/20 01/11/22  Varanasi, Jayadeep S, MD  ?sacubitril-valsartan (ENTRESTO) 24-26 MG Take 1 tablet by mouth 2 (two) times daily. 07/07/21   Varanasi, Jayadeep S, MD  ? ? ?Allergies  ?Allergen Reactions  ? Penicillins Hives  ?  Has tolerated Keflex      Has patient had a PCN reaction causing immediate rash, facial/tongue/throat swelling, SOB or lightheadedness with hypotension: YES ?Has patient had a PCN reaction causing severe rash involving mucus membranes or skin necrosis: No ?Has patient had a PCN reaction that required hospitalization No ?Has patient had a PCN reaction occurring within the last 10 years: No ?If all of  the above answers are "NO", then may proceed with Cephalosporin use. ?  ? Hydromorphone Hcl Nausea And Vomiting  ? Other Other (See Comments)  ? ? ?REVIEW OF SYSTEMS:  ?General: no fevers/chills/night sweats ?Eyes: no blur

## 2022-01-23 NOTE — H&P (View-Only) (Signed)
? ? ?Patient ID: ?Gregory Mccormick ?MRN: [id number] ?DOB/AGE: 08-Mar-1929 88 y.o. ? ?Primary Care Physician:Swayne, Shanon Brow, MD ?Primary Cardiologist: Casandra Doffing, MD ? ? ?FOCUSED CARDIOVASCULAR PROBLEM LIST:   ?1.  Aortic stenosis with mean gradient of 36 mmHg on echocardiogram 2022 ?2.  Degenerative mitral regurgitation with at least moderate mitral regurgitation on echocardiogram 2022 ?3.  Coronary artery disease status post LAD and left circumflex stenting in 2014 ?3.  Paroxysmal atrial fibrillation on Xarelto ?4.  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  Hypertension ?6.  Hyperlipidemia ?7.  Stage 3 chronic kidney disease with a creatinine of 1.7 and a GFR in the 30s ? ? ?HISTORY OF PRESENT ILLNESS: ?The patient is a 88 y.o. male with the indicated medical history here for recommendations regarding his probably valvular disease.  He was seen by Dr. Irish Lack recently and the patient had noted increasing shortness of breath.  An echocardiogram done last year demonstrated severe aortic stenosis and possible significant mitral regurgitation.  The patient is here with his son.  The patient is a very functional 41 year old.  He does a lot of physical labor on his property.  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  He has noticed over the last few months that he is developed increasing shortness of breath.  He will have to stop and take a deep breath when he is walking his dog.  He tells me that this is quite bothersome to him because he has to stop frequently and sometimes he does not know when he needs to stop.  Makes him unsure as to what activities he can do before he gets into perhaps trouble.  He denies any exertional angina, severe bleeding but he does have nuisance bruising.  He does have some presyncope when he goes from sitting to standing quickly.  He has had no frank syncope.  He is required no emergency room visits or  hospitalizations. ? ?He reports very good dental health and sees a dentist every 6 months. ? ?Past Medical History:  ?Diagnosis Date  ? Anemia, unspecified   ? Aortic stenosis   ? Arthritis   ? "right knee" (03/28/2013  ? CAD in native artery   ? takes Xarelto daily  ? Cardiomyopathy ([place])   ? EF 40%:  ECHO 11/25/2015 EF 55%-60%  ? Carpal tunnel syndrome, bilateral   ? Chronic systolic CHF (congestive heart failure) ([place])   ? CKD (chronic kidney disease), stage II   ? Epistaxis   ? Essential hypertension, benign   ? takes Metoprolol daily  ? H/O hiatal hernia   ? History of kidney stones   ? Hyperlipidemia   ? takes Lipitor daily  ? Nonspecific abnormal unspecified cardiovascular function study   ? OSA on CPAP   ? wears CPAP 02/03/16  ? Pacemaker   ? MDT  ? PAF (paroxysmal atrial fibrillation) ([place])   ? Paroxysmal ventricular tachycardia   ? PONV (postoperative nausea and vomiting)   ? PVC's (premature ventricular contractions)   ? Vitamin D deficiency   ?  ?Past Surgical History:  ?Procedure Laterality Date  ? CARDIAC CATHETERIZATION  03/21/2013  ? CARDIOVERSION N/A 10/09/2018  ? Procedure: CARDIOVERSION;  Surgeon: Josue Hector, MD;  Location: [facility];  Service: Cardiovascular;  Laterality: N/A;  ? CARPAL TUNNEL RELEASE Right 02/05/2016  ? Procedure: RIGHT LIMITED OPEN CARPAL TUNNEL RELEASE;  Surgeon: Roseanne Kaufman, MD;  Location: [place];  Service: Orthopedics;  Laterality: Right;  ? CARPAL TUNNEL RELEASE Left 04/21/2016  ? Procedure: CARPAL TUNNEL RELEASE;  Surgeon: Roseanne Kaufman, MD;  Location: [place];  Service: Orthopedics;  Laterality: Left;  ? CATARACT EXTRACTION W/ INTRAOCULAR LENS  IMPLANT, BILATERAL Bilateral 2000's  ? COLONOSCOPY    ? CORONARY ANGIOPLASTY WITH STENT PLACEMENT  03/28/2013  ? "3 stents" (03/28/2013)  ? HIP ARTHROPLASTY Right 04/30/2019  ? Procedure: ARTHROPLASTY BIPOLAR HIP (HEMIARTHROPLASTY);  Surgeon: Paralee Cancel, MD;  Location: [facility];  Service: Orthopedics;  Laterality: Right;  ? INSERT /  REPLACE / REMOVE PACEMAKER    ? [place]  ? "cut it open" (03/28/2013)  ? KNEE CARTILAGE SURGERY Right ~ 1977  ? "cut it open" (03/28/2013)  ? PACEMAKER INSERTION  10/05/10  ? MDT Adapta L implanted by Dr Rayann Heman for mobitz II second degree AV block  ? PERCUTANEOUS CORONARY ROTOBLATOR INTERVENTION (PCI-R) N/A 03/28/2013  ? Procedure: PERCUTANEOUS CORONARY ROTOBLATOR INTERVENTION (PCI-R);  Surgeon: Jettie Booze, MD;  Location: [facility];  Service: Cardiovascular;  Laterality: N/A;  ? SEPTOPLASTY N/A 02/05/2016  ? Procedure: SEPTOPLASTY WITH INTRANASAL SKIN GRAFT;  Surgeon: Rozetta Nunnery, MD;  Location: [place];  Service: ENT;  Laterality: N/A;  ? SHOULDER HEMI-ARTHROPLASTY Right 1987  ? "got hit by sled & dragged down sheet > 200 feet; tore shoulder up" (03/28/2013)  ? SHOULDER SURGERY Right 1970's  ? "pulled muscle apart; sewed it back together" (03/28/2013  ? TOTAL KNEE ARTHROPLASTY Right 2007  ? TOTAL KNEE ARTHROPLASTY  11/26/2012  ? Procedure: TOTAL KNEE ARTHROPLASTY;  Surgeon: Gearlean Alf, MD;  Location: [facility];  Service: Orthopedics;  Laterality: Left;  ? TRANSURETHRAL RESECTION OF PROSTATE N/A 12/27/2017  ? Procedure: TRANSURETHRAL RESECTION OF THE PROSTATE (TURP);  Surgeon: Lucas Mallow, MD;  Location: [facility];  Service: Urology;  Laterality: N/A;  ?  ?Family History  ?Problem Relation Age of Onset  ? CAD Brother 51  ? Stroke Brother   ? Cancer Other   ? Heart disease Other   ? Heart attack Neg Hx   ? Hypertension Neg Hx   ?  ?Social History  ? ?Socioeconomic History  ? Marital status: Married  ?  Spouse name: Not on file  ? Number of children: Not on file  ? Years of education: Not on file  ? Highest education level: Not on file  ?Occupational History  ? Occupation: retired  ?  Employer: [employer]  ?Tobacco Use  ? Smoking status: Former  ?  Packs/day: 1.00  ?  Years: 26.00  ?  Pack years: 26.00  ?  Types: Cigarettes  ?  Quit date: 10/31/1973  ?   Years since quitting: 48.2  ? Smokeless tobacco: Never  ?Vaping Use  ? Vaping Use: Never used  ?Substance and Sexual Activity  ? Alcohol use: No  ? Drug use: No  ? Sexual activity: Never  ?Other Topics Concern  ? Not on file  ?Social History Narrative  ? Not on file  ? ?Social Determinants of Health  ? ?Financial Resource Strain: Not on file  ?Food Insecurity: Not on file  ?Transportation Needs: Not on file  ?Physical Activity: Not on file  ?Stress: Not on file  ?Social Connections: Not on file  ?Intimate Partner Violence: Not on file  ?  ? ?Prior to Admission medications   ?Medication Sig Start Date End Date Taking? Authorizing Provider  ?amiodarone (PACERONE) 200 MG tablet TAKE 1 TABLET  BY MOUTH  DAILY 12/06/21   Jettie Booze, MD  ?apixaban (ELIQUIS) 2.5 MG TABS tablet TAKE 1 TABLET BY MOUTH  TWICE DAILY 12/06/21   Allred, Jeneen Rinks, MD  ?atorvastatin (LIPITOR) 10 MG tablet TAKE 1 TABLET BY MOUTH  DAILY AT 6 PM. 12/06/21   Jettie Booze, MD  ?furosemide (LASIX) 40 MG tablet Take 1 tablet (40 mg total) by mouth daily. 01/13/22   Jettie Booze, MD  ?latanoprost (XALATAN) 0.005 % ophthalmic solution SMARTSIG:In Eye(s) 11/28/20   [provider]  ?nitroGLYCERIN (NITROSTAT) 0.4 MG SL tablet Place 1 tablet (0.4 mg total) under the tongue every 5 (five) minutes as needed for chest pain. 12/07/20 01/11/22  Jettie Booze, MD  ?sacubitril-valsartan (ENTRESTO) 24-26 MG Take 1 tablet by mouth 2 (two) times daily. 07/07/21   Jettie Booze, MD  ? ? ?Allergies  ?Allergen Reactions  ? Penicillins Hives  ?  Has tolerated Keflex      Has patient had a PCN reaction causing immediate rash, facial/tongue/throat swelling, SOB or lightheadedness with hypotension: YES ?Has patient had a PCN reaction causing severe rash involving mucus membranes or skin necrosis: No ?Has patient had a PCN reaction that required hospitalization No ?Has patient had a PCN reaction occurring within the last 10 years: No ?If all of  the above answers are "NO", then may proceed with Cephalosporin use. ?  ? Hydromorphone Hcl Nausea And Vomiting  ? Other Other (See Comments)  ? ? ?REVIEW OF SYSTEMS:  ?General: no fevers/chills/night sweats ?Eyes: no blur

## 2022-01-24 ENCOUNTER — Encounter: Payer: Self-pay | Admitting: *Deleted

## 2022-01-24 ENCOUNTER — Encounter: Payer: Self-pay | Admitting: Internal Medicine

## 2022-01-24 ENCOUNTER — Other Ambulatory Visit: Payer: Self-pay

## 2022-01-24 ENCOUNTER — Other Ambulatory Visit: Payer: Medicare Other | Admitting: *Deleted

## 2022-01-24 ENCOUNTER — Ambulatory Visit (INDEPENDENT_AMBULATORY_CARE_PROVIDER_SITE_OTHER): Payer: Medicare Other | Admitting: Internal Medicine

## 2022-01-24 ENCOUNTER — Ambulatory Visit (HOSPITAL_COMMUNITY): Payer: Medicare Other | Attending: Cardiology

## 2022-01-24 ENCOUNTER — Telehealth: Payer: Self-pay | Admitting: Internal Medicine

## 2022-01-24 VITALS — BP 112/78 | HR 65 | Ht 68.0 in | Wt 192.0 lb

## 2022-01-24 DIAGNOSIS — I480 Paroxysmal atrial fibrillation: Secondary | ICD-10-CM

## 2022-01-24 DIAGNOSIS — R0602 Shortness of breath: Secondary | ICD-10-CM | POA: Insufficient documentation

## 2022-01-24 DIAGNOSIS — I350 Nonrheumatic aortic (valve) stenosis: Secondary | ICD-10-CM

## 2022-01-24 DIAGNOSIS — I359 Nonrheumatic aortic valve disorder, unspecified: Secondary | ICD-10-CM | POA: Diagnosis present

## 2022-01-24 DIAGNOSIS — I340 Nonrheumatic mitral (valve) insufficiency: Secondary | ICD-10-CM

## 2022-01-24 DIAGNOSIS — R0600 Dyspnea, unspecified: Secondary | ICD-10-CM

## 2022-01-24 DIAGNOSIS — I2510 Atherosclerotic heart disease of native coronary artery without angina pectoris: Secondary | ICD-10-CM | POA: Diagnosis present

## 2022-01-24 DIAGNOSIS — I5032 Chronic diastolic (congestive) heart failure: Secondary | ICD-10-CM | POA: Insufficient documentation

## 2022-01-24 DIAGNOSIS — N1832 Chronic kidney disease, stage 3b: Secondary | ICD-10-CM

## 2022-01-24 LAB — BASIC METABOLIC PANEL
BUN/Creatinine Ratio: 16 (ref 10–24)
BUN: 28 mg/dL (ref 10–36)
CO2: 30 mmol/L — ABNORMAL HIGH (ref 20–29)
Calcium: 9.5 mg/dL (ref 8.6–10.2)
Chloride: 101 mmol/L (ref 96–106)
Creatinine, Ser: 1.77 mg/dL — ABNORMAL HIGH (ref 0.76–1.27)
Glucose: 100 mg/dL — ABNORMAL HIGH (ref 70–99)
Potassium: 4.6 mmol/L (ref 3.5–5.2)
Sodium: 139 mmol/L (ref 134–144)
eGFR: 36 mL/min/{1.73_m2} — ABNORMAL LOW (ref >59)

## 2022-01-24 LAB — ECHOCARDIOGRAM COMPLETE
AR max vel: 1.12 cm2
AV Area VTI: 1.11 cm2
AV Area mean vel: 1.13 cm2
AV Mean grad: 31 mmHg
AV Peak grad: 58.2 mmHg
Ao pk vel: 3.82 m/s
Area-P 1/2: 4.31 cm2
MV M vel: 4.86 m/s
MV Peak grad: 94.5 mmHg
Radius: 0.75 cm
S' Lateral: 3.7 cm

## 2022-01-24 NOTE — Patient Instructions (Signed)
Medication Instructions:  ?Your physician recommends that you continue on your current medications as directed. Please refer to the Current Medication list given to you today. ? ?*If you need a refill on your cardiac medications before your next appointment, please call your pharmacy* ? ? ?Lab Work: ?None ?If you have labs (blood work) drawn today and your tests are completely normal, you will receive your results only by: ?MyChart Message (if you have MyChart) OR ?A paper copy in the mail ?If you have any lab test that is abnormal or we need to change your treatment, we will call you to review the results. ? ? ?Testing/Procedures: ?None ? ? ?Follow-Up: ?Will depend on next steps pending your echocardiogram1}  ? ? ?Other Instructions ?  ?

## 2022-01-24 NOTE — Progress Notes (Addendum)
Pre Surgical Assessment: 5 M Walk Test ? ?27M=16.8ft ? ?5 Meter Walk Test- trial 1: 7.23 seconds ?5 Meter Walk Test- trial 2: 6.69 seconds ?5 Meter Walk Test- trial 3: 6.42 seconds ?5 Meter Walk Test Average: 6.78 seconds ? ?STS Risk calculator:  ?Isolated AVR  ?Risk of Mortality: ?3.284% ?Renal Failure: ?7.188% ?Permanent Stroke: ?3.440% ?Prolonged Ventilation: ?12.108% ?DSW Infection: ?0.084% ?Reoperation: ?4.838% ?Morbidity or Mortality: ?23.183% ?Short Length of Stay: ?21.653% ?Long Length of Stay: ?11.208% ? ? ?  ?

## 2022-01-24 NOTE — Telephone Encounter (Signed)
From: [name], MD  ?Sent: 01/24/2022   1:57 PM EDT  ?To: Janetta Hora, PA-C, *  ? ?I amended my note once the echo read came back as moderate to severe.  I spoke with patient's son.  Let's get TAVR CTA, cath with Eldridge Dace, and appt with [facility].   Thanks.  ? ? ?Spoke with pt and he would like to do heart cath on 01/27/22.  Called and got it scheduled and got letter of instruction ready.  Called pt and went over everything with him.  He verbalized understanding and asked that I call his son, August Saucer, and review information as well.  Called Dean and reviewed instructions.  He has access to pt's MyChart and will print the letter and take it to pt and go over everything again.  Deal was appreciative for assistance.   ?

## 2022-01-25 ENCOUNTER — Other Ambulatory Visit: Payer: Self-pay | Admitting: Physician Assistant

## 2022-01-25 ENCOUNTER — Encounter: Payer: Self-pay | Admitting: Physician Assistant

## 2022-01-25 DIAGNOSIS — I350 Nonrheumatic aortic (valve) stenosis: Secondary | ICD-10-CM

## 2022-01-26 ENCOUNTER — Telehealth: Payer: Self-pay | Admitting: *Deleted

## 2022-01-26 NOTE — Telephone Encounter (Signed)
Cardiac Catheterization scheduled at [facility] for: Thursday January 27, 2022 12 Noon ?Arrival time and place: [facility] Main Entrance A at: 7 AM-pre-procedure hydration ? ? ?No solid food after midnight prior to cath, clear liquids until 5 AM day of procedure. ? ?Medication instructions: ?-Hold: ? Eliquis-none 01/25/22 until post procedure  ? Lasix/Entresto-day before and day of procedure-per protocol -GFR 36 ?-Except hold medications usual morning medications can be taken with sips of water including aspirin 81 mg. ? ?Confirmed patient has responsible adult to drive home post procedure and be with patient first 24 hours after arriving home. ? ?Patient reports no new symptoms concerning for COVID-19/no exposure to COVID-19 in the past 10 days. ? ?Reviewed procedure instructions with patient.  ?

## 2022-01-26 NOTE — Telephone Encounter (Signed)
Left message for patient's son, August Saucer, to call back to also review procedure instructions with him. ?

## 2022-01-26 NOTE — Telephone Encounter (Signed)
Also reviewed procedure instructions with patient's son, August Saucer. ?

## 2022-01-27 ENCOUNTER — Encounter (HOSPITAL_COMMUNITY)
Admission: RE | Disposition: A | Discharge status: Home / Self Care | Payer: Self-pay | Source: Home / Self Care | Attending: Interventional Cardiology

## 2022-01-27 ENCOUNTER — Ambulatory Visit (HOSPITAL_COMMUNITY)
Admission: RE | Admit: 2022-01-27 | Discharge: 2022-01-27 | Disposition: A | Discharge status: Home / Self Care | Payer: Medicare Other | Attending: Interventional Cardiology | Admitting: Interventional Cardiology

## 2022-01-27 ENCOUNTER — Other Ambulatory Visit: Payer: Self-pay

## 2022-01-27 DIAGNOSIS — I480 Paroxysmal atrial fibrillation: Secondary | ICD-10-CM | POA: Insufficient documentation

## 2022-01-27 DIAGNOSIS — I2510 Atherosclerotic heart disease of native coronary artery without angina pectoris: Secondary | ICD-10-CM

## 2022-01-27 DIAGNOSIS — I130 Hypertensive heart and chronic kidney disease with heart failure and stage 1 through stage 4 chronic kidney disease, or unspecified chronic kidney disease: Secondary | ICD-10-CM | POA: Insufficient documentation

## 2022-01-27 DIAGNOSIS — E785 Hyperlipidemia, unspecified: Secondary | ICD-10-CM | POA: Insufficient documentation

## 2022-01-27 DIAGNOSIS — I4821 Permanent atrial fibrillation: Secondary | ICD-10-CM

## 2022-01-27 DIAGNOSIS — I429 Cardiomyopathy, unspecified: Secondary | ICD-10-CM | POA: Diagnosis not present

## 2022-01-27 DIAGNOSIS — I455 Other specified heart block: Secondary | ICD-10-CM | POA: Diagnosis not present

## 2022-01-27 DIAGNOSIS — N183 Chronic kidney disease, stage 3 unspecified: Secondary | ICD-10-CM | POA: Diagnosis not present

## 2022-01-27 DIAGNOSIS — G4733 Obstructive sleep apnea (adult) (pediatric): Secondary | ICD-10-CM | POA: Insufficient documentation

## 2022-01-27 DIAGNOSIS — Z79899 Other long term (current) drug therapy: Secondary | ICD-10-CM | POA: Diagnosis not present

## 2022-01-27 DIAGNOSIS — N1832 Chronic kidney disease, stage 3b: Secondary | ICD-10-CM | POA: Diagnosis not present

## 2022-01-27 DIAGNOSIS — Z7901 Long term (current) use of anticoagulants: Secondary | ICD-10-CM | POA: Insufficient documentation

## 2022-01-27 DIAGNOSIS — R0600 Dyspnea, unspecified: Secondary | ICD-10-CM | POA: Insufficient documentation

## 2022-01-27 DIAGNOSIS — I080 Rheumatic disorders of both mitral and aortic valves: Secondary | ICD-10-CM | POA: Diagnosis not present

## 2022-01-27 DIAGNOSIS — Z87891 Personal history of nicotine dependence: Secondary | ICD-10-CM | POA: Insufficient documentation

## 2022-01-27 DIAGNOSIS — I350 Nonrheumatic aortic (valve) stenosis: Secondary | ICD-10-CM | POA: Diagnosis not present

## 2022-01-27 DIAGNOSIS — Z955 Presence of coronary angioplasty implant and graft: Secondary | ICD-10-CM | POA: Diagnosis not present

## 2022-01-27 DIAGNOSIS — Z950 Presence of cardiac pacemaker: Secondary | ICD-10-CM | POA: Diagnosis not present

## 2022-01-27 DIAGNOSIS — I5042 Chronic combined systolic (congestive) and diastolic (congestive) heart failure: Secondary | ICD-10-CM | POA: Insufficient documentation

## 2022-01-27 HISTORY — PX: RIGHT/LEFT HEART CATH AND CORONARY ANGIOGRAPHY: CATH118266

## 2022-01-27 LAB — POCT I-STAT EG7
Acid-Base Excess: 1 mmol/L (ref 0.0–2.0)
Acid-Base Excess: 1 mmol/L (ref 0.0–2.0)
Bicarbonate: 26.6 mmol/L (ref 20.0–28.0)
Bicarbonate: 26.6 mmol/L (ref 20.0–28.0)
Calcium, Ion: 1.2 mmol/L (ref 1.15–1.40)
Calcium, Ion: 1.21 mmol/L (ref 1.15–1.40)
HCT: 37 % — ABNORMAL LOW (ref 39.0–52.0)
HCT: 36 % — ABNORMAL LOW (ref 39.0–52.0)
Hemoglobin: 12.6 g/dL — ABNORMAL LOW (ref 13.0–17.0)
Hemoglobin: 12.2 g/dL — ABNORMAL LOW (ref 13.0–17.0)
O2 Saturation: 67 %
O2 Saturation: 65 %
Potassium: 4.1 mmol/L (ref 3.5–5.1)
Potassium: 4.2 mmol/L (ref 3.5–5.1)
Sodium: 141 mmol/L (ref 135–145)
Sodium: 142 mmol/L (ref 135–145)
TCO2: 28 mmol/L (ref 22–32)
TCO2: 28 mmol/L (ref 22–32)
pCO2, Ven: 44.4 mmHg (ref 44–60)
pCO2, Ven: 45 mmHg (ref 44–60)
pH, Ven: 7.385 (ref 7.25–7.43)
pH, Ven: 7.379 (ref 7.25–7.43)
pO2, Ven: 36 mmHg (ref 32–45)
pO2, Ven: 35 mmHg (ref 32–45)

## 2022-01-27 LAB — POCT I-STAT 7, (LYTES, BLD GAS, ICA,H+H)
Acid-Base Excess: 0 mmol/L (ref 0.0–2.0)
Bicarbonate: 23.1 mmol/L (ref 20.0–28.0)
Calcium, Ion: 1.17 mmol/L (ref 1.15–1.40)
HCT: 36 % — ABNORMAL LOW (ref 39.0–52.0)
Hemoglobin: 12.2 g/dL — ABNORMAL LOW (ref 13.0–17.0)
O2 Saturation: 97 %
Potassium: 4.2 mmol/L (ref 3.5–5.1)
Sodium: 140 mmol/L (ref 135–145)
TCO2: 24 mmol/L (ref 22–32)
pCO2 arterial: 32.5 mmHg (ref 32–48)
pH, Arterial: 7.459 — ABNORMAL HIGH (ref 7.35–7.45)
pO2, Arterial: 83 mmHg (ref 83–108)

## 2022-01-27 SURGERY — RIGHT/LEFT HEART CATH AND CORONARY ANGIOGRAPHY
Anesthesia: LOCAL

## 2022-01-27 MED ORDER — HEPARIN SODIUM (PORCINE) 1000 UNIT/ML IJ SOLN
INTRAMUSCULAR | Status: DC | PRN
  Administered 2022-01-27: 4500 [IU] via INTRAVENOUS

## 2022-01-27 MED ORDER — SODIUM CHLORIDE 0.9% FLUSH: 3.0000 mL | Freq: Two times a day (BID) | INTRAVENOUS | Status: DC

## 2022-01-27 MED ORDER — VERAPAMIL HCL 2.5 MG/ML IV SOLN
INTRAVENOUS | Status: DC | PRN
  Administered 2022-01-27: 10 mL via INTRA_ARTERIAL
  Administered 2022-01-27: 2 mL via INTRA_ARTERIAL

## 2022-01-27 MED ORDER — IOHEXOL 350 MG/ML SOLN
INTRAVENOUS | Status: DC | PRN
  Administered 2022-01-27: 30 mL

## 2022-01-27 MED ORDER — ASPIRIN 81 MG PO CHEW: 81.0000 mg | CHEWABLE_TABLET | ORAL | Status: DC

## 2022-01-27 MED ORDER — FENTANYL CITRATE (PF) 100 MCG/2ML IJ SOLN
INTRAMUSCULAR | Status: DC | PRN
  Administered 2022-01-27: 25 ug via INTRAVENOUS

## 2022-01-27 MED ORDER — MIDAZOLAM HCL 2 MG/2ML IJ SOLN
INTRAMUSCULAR | Status: AC
  Filled 2022-01-27: qty 2

## 2022-01-27 MED ORDER — HEPARIN (PORCINE) IN NACL 1000-0.9 UT/500ML-% IV SOLN
INTRAVENOUS | Status: AC
  Filled 2022-01-27: qty 1000

## 2022-01-27 MED ORDER — LIDOCAINE HCL (PF) 1 % IJ SOLN
INTRAMUSCULAR | Status: DC | PRN
Start: 2022-01-27 — End: 2022-01-27
  Administered 2022-01-27: 1 mL
  Administered 2022-01-27: 2 mL

## 2022-01-27 MED ORDER — SODIUM CHLORIDE 0.9% FLUSH: 3.0000 mL | INTRAVENOUS | Status: DC | PRN

## 2022-01-27 MED ORDER — SODIUM CHLORIDE 0.9 % WEIGHT BASED INFUSION
3.0000 mL/kg/h | INTRAVENOUS | Status: AC
  Administered 2022-01-27: 3 mL/kg/h via INTRAVENOUS

## 2022-01-27 MED ORDER — SODIUM CHLORIDE 0.9 % WEIGHT BASED INFUSION: 1.0000 mL/kg/h | INTRAVENOUS | Status: DC

## 2022-01-27 MED ORDER — HEPARIN SODIUM (PORCINE) 1000 UNIT/ML IJ SOLN
INTRAMUSCULAR | Status: AC
  Filled 2022-01-27: qty 10

## 2022-01-27 MED ORDER — VERAPAMIL HCL 2.5 MG/ML IV SOLN
INTRAVENOUS | Status: AC
  Filled 2022-01-27: qty 2

## 2022-01-27 MED ORDER — HEPARIN (PORCINE) IN NACL 1000-0.9 UT/500ML-% IV SOLN
INTRAVENOUS | Status: DC | PRN
Start: 2022-01-27 — End: 2022-01-27
  Administered 2022-01-27 (×2): 500 mL

## 2022-01-27 MED ORDER — SODIUM CHLORIDE 0.9 % IV SOLN: 250.0000 mL | INTRAVENOUS | Status: DC | PRN

## 2022-01-27 MED ORDER — LIDOCAINE HCL (PF) 1 % IJ SOLN
INTRAMUSCULAR | Status: AC
  Filled 2022-01-27: qty 30

## 2022-01-27 MED ORDER — MIDAZOLAM HCL 2 MG/2ML IJ SOLN
INTRAMUSCULAR | Status: DC | PRN
  Administered 2022-01-27: 1 mg via INTRAVENOUS

## 2022-01-27 MED ORDER — FENTANYL CITRATE (PF) 100 MCG/2ML IJ SOLN
INTRAMUSCULAR | Status: AC
  Filled 2022-01-27: qty 2

## 2022-01-27 SURGICAL SUPPLY — 12 items
BAND CMPR LRG ZPHR (HEMOSTASIS) ×1
BAND ZEPHYR COMPRESS 30 LONG (HEMOSTASIS) ×1 IMPLANT
CATH 5FR JL3.5 JR4 ANG PIG MP (CATHETERS) ×1 IMPLANT
CATH BALLN WEDGE 5F 110CM (CATHETERS) ×1 IMPLANT
GLIDESHEATH SLEND SS 6F .021 (SHEATH) ×1 IMPLANT
GUIDEWIRE INQWIRE 1.5J.035X260 (WIRE) IMPLANT
INQWIRE 1.5J .035X260CM (WIRE) ×2
KIT HEART LEFT (KITS) ×2 IMPLANT
PACK CARDIAC CATHETERIZATION (CUSTOM PROCEDURE TRAY) ×2 IMPLANT
SHEATH GLIDE SLENDER 4/5FR (SHEATH) ×1 IMPLANT
TRANSDUCER W/STOPCOCK (MISCELLANEOUS) ×2 IMPLANT
TUBING CIL FLEX 10 FLL-RA (TUBING) ×2 IMPLANT

## 2022-01-27 NOTE — Interval H&P Note (Signed)
Cath Lab Visit (complete for each Cath Lab visit) ? ?Clinical Evaluation Leading to the Procedure:  ? ?ACS: No. ? ?Non-ACS:   ? ?Anginal Classification: CCS III ? ?Anti-ischemic medical therapy: Minimal Therapy (1 class of medications) ? ?Non-Invasive Test Results: High-risk stress test findings: cardiac mortality >3%/year ? ?Prior CABG: No previous CABG ? ?Severe aortic stenosis ? ? ? ? ?History and Physical Interval Note: ? ?01/27/2022 ?1:02 PM ? ?Gregory Mccormick  has presented today for surgery, with the diagnosis of aortic stenosis - mr.  The various methods of treatment have been discussed with the patient and family. After consideration of risks, benefits and other options for treatment, the patient has consented to  Procedure(s): ?RIGHT/LEFT HEART CATH AND CORONARY ANGIOGRAPHY (N/A) as a surgical intervention.  The patient's history has been reviewed, patient examined, no change in status, stable for surgery.  I have reviewed the patient's chart and labs.  Questions were answered to the patient's satisfaction.   ? ? ?Larae Grooms ? ? ?

## 2022-01-28 ENCOUNTER — Encounter (HOSPITAL_COMMUNITY): Payer: Self-pay | Admitting: Interventional Cardiology

## 2022-02-03 ENCOUNTER — Encounter: Payer: Self-pay | Admitting: Physician Assistant

## 2022-02-08 ENCOUNTER — Ambulatory Visit (HOSPITAL_COMMUNITY)
Admission: RE | Admit: 2022-02-08 | Discharge: 2022-02-08 | Disposition: A | Discharge status: Home / Self Care | Payer: Medicare Other | Source: Ambulatory Visit | Attending: Family Medicine | Admitting: Family Medicine

## 2022-02-08 ENCOUNTER — Ambulatory Visit (HOSPITAL_COMMUNITY)
Admission: RE | Admit: 2022-02-08 | Discharge: 2022-02-08 | Disposition: A | Discharge status: Home / Self Care | Payer: Medicare Other | Source: Ambulatory Visit | Attending: Physician Assistant | Admitting: Physician Assistant

## 2022-02-08 DIAGNOSIS — N1832 Chronic kidney disease, stage 3b: Secondary | ICD-10-CM | POA: Diagnosis present

## 2022-02-08 DIAGNOSIS — I350 Nonrheumatic aortic (valve) stenosis: Secondary | ICD-10-CM

## 2022-02-08 LAB — BASIC METABOLIC PANEL
Anion gap: 5 (ref 5–15)
BUN: 22 mg/dL (ref 8–23)
CO2: 28 mmol/L (ref 22–32)
Calcium: 9.2 mg/dL (ref 8.9–10.3)
Chloride: 107 mmol/L (ref 98–111)
Creatinine, Ser: 1.62 mg/dL — ABNORMAL HIGH (ref 0.61–1.24)
GFR, Estimated: 40 mL/min — ABNORMAL LOW (ref >60)
Glucose, Bld: 107 mg/dL — ABNORMAL HIGH (ref 70–99)
Potassium: 5.1 mmol/L (ref 3.5–5.1)
Sodium: 140 mmol/L (ref 135–145)

## 2022-02-08 MED ORDER — SODIUM CHLORIDE 0.9 % WEIGHT BASED INFUSION
3.0000 mL/kg/h | INTRAVENOUS | Status: DC
  Administered 2022-02-08: 3 mL/kg/h via INTRAVENOUS

## 2022-02-08 MED ORDER — SODIUM CHLORIDE 0.9 % WEIGHT BASED INFUSION: 1.0000 mL/kg/h | INTRAVENOUS | Status: DC

## 2022-02-08 MED ORDER — IOHEXOL 350 MG/ML SOLN
80.0000 mL | Freq: Once | INTRAVENOUS | Status: AC | PRN
  Administered 2022-02-08: 80 mL via INTRAVENOUS

## 2022-02-08 NOTE — Progress Notes (Signed)
Spoke with Gregory Mccormick in [place] and told her MR Nicole's one hour of fluids would be done at 1005 ?

## 2022-02-14 ENCOUNTER — Other Ambulatory Visit: Payer: Self-pay | Admitting: Interventional Cardiology

## 2022-02-27 ENCOUNTER — Other Ambulatory Visit: Payer: Self-pay | Admitting: Interventional Cardiology

## 2022-02-28 NOTE — Progress Notes (Addendum)
?  ?Cardiology Office Note ? ? ?Date:  03/02/2022  ? ?ID:  Gregory Mccormick, DOB Apr 21, 1929, MRN [id number] ? ?PCP:  Antony Contras, MD  ? ? ?No chief complaint on file. ? ?Severe AS ? ?Wt Readings from Last 3 Encounters:  ?03/02/22 194 lb 6.4 oz (88.2 kg)  ?02/08/22 190 lb (86.2 kg)  ?01/27/22 192 lb (87.1 kg)  ?  ? ?  ?History of Present Illness: ?Gregory Mccormick is a 88 y.o. 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 ?  ?He had remote PCI in 2014 and previously had normal LV function.  ?  ?He was hospitalized in 08/2018 with atypical chest pain. Troponins were flat and CTA was negative for PE. However, his EF was noted to be down - EF 30-35%, diffuse HK, moderate AS, mild MR, mod LAE, severely dilated RA, mildly dilated RV, mild TR, PASP 46, elevated CVP. ?  ?He Was hospitalized and underwent right hip Hemiarthroplasty [id number] without complications. Patient had no trouble with heart failure or chest pain while in the hospital. ?  ?His wife Enid Derry passed away in 05/13/19. ?  ?I saw patient 05/15/2019 with worsening leg edema right greater than left.  Edema resolved with diuresis.  After that time, he felt well.  ? ?Diagnosed with Severe AS causing some sx. still with some dyspnea on exertion if he overdoes it.  He continues to walk the dog at a slow pace. ? ?Cath and CT scans done in 2023 in preparation of TAVR. ? ?Denies : Chest pain. Dizziness. Nitroglycerin use. Orthopnea. Palpitations. Paroxysmal nocturnal dyspnea. Syncope.   ? ?Past Medical History:  ?Diagnosis Date  ? Anemia, unspecified   ? Aortic stenosis   ? Arthritis   ? "right knee" (03/28/2013  ? CAD in native artery   ? takes Xarelto daily  ? Cardiomyopathy ([place])   ? EF 40%:  ECHO 11/25/2015 EF 55%-60%  ?  Carpal tunnel syndrome, bilateral   ? Chronic systolic CHF (congestive heart failure) ([place])   ? CKD (chronic kidney disease), stage II   ? Epistaxis   ? Essential hypertension, benign   ? takes Metoprolol daily  ? H/O hiatal hernia   ? History of kidney stones   ? Hyperlipidemia   ? takes Lipitor daily  ? Nonspecific abnormal unspecified cardiovascular function study   ? OSA on CPAP   ? wears CPAP 02/03/16  ? Pacemaker   ? MDT  ? PAF (paroxysmal atrial fibrillation) ([place])   ? Paroxysmal ventricular tachycardia ([place])   ? PONV (postoperative nausea and vomiting)   ? PVC's (premature ventricular contractions)   ? Vitamin D deficiency   ? ? ?Past Surgical History:  ?Procedure Laterality Date  ? CARDIAC CATHETERIZATION  03/21/2013  ? CARDIOVERSION N/A 10/09/2018  ? Procedure: CARDIOVERSION;  Surgeon: Josue Hector, MD;  Location: [facility] ENDOSCOPY;  Service: Cardiovascular;  Laterality: N/A;  ? CARPAL TUNNEL RELEASE Right 02/05/2016  ? Procedure: RIGHT LIMITED OPEN CARPAL TUNNEL RELEASE;  Surgeon: Roseanne Kaufman, MD;  Location: [place];  Service: Orthopedics;  Laterality: Right;  ? CARPAL TUNNEL RELEASE Left 04/21/2016  ? Procedure: CARPAL TUNNEL RELEASE;  Surgeon: Roseanne Kaufman, MD;  Location: [place];  Service: Orthopedics;  Laterality: Left;  ? CATARACT EXTRACTION W/ INTRAOCULAR LENS  IMPLANT,  BILATERAL Bilateral 2000's  ? COLONOSCOPY    ? CORONARY ANGIOPLASTY WITH STENT PLACEMENT  03/28/2013  ? "3 stents" (03/28/2013)  ? HIP ARTHROPLASTY Right 04/30/2019  ? Procedure: ARTHROPLASTY BIPOLAR HIP (HEMIARTHROPLASTY);  Surgeon: Paralee Cancel, MD;  Location: [facility];  Service: Orthopedics;  Laterality: Right;  ? INSERT / REPLACE / REMOVE PACEMAKER    ? [place]  ? "cut it open" (03/28/2013)  ? KNEE CARTILAGE SURGERY Right ~ 1977  ? "cut it open" (03/28/2013)  ? PACEMAKER INSERTION  10/05/10  ? MDT Adapta L implanted by Dr Rayann Heman for mobitz II second degree AV block  ? PERCUTANEOUS CORONARY ROTOBLATOR INTERVENTION  (PCI-R) N/A 03/28/2013  ? Procedure: PERCUTANEOUS CORONARY ROTOBLATOR INTERVENTION (PCI-R);  Surgeon: Jettie Booze, MD;  Location[facility];  Service: Cardiovascular;  Laterality: N/A;  ? RIGHT/LEFT HEART CATH AND CORONARY ANGIOGRAPHY N/A 01/27/2022  ? Procedure: RIGHT/LEFT HEART CATH AND CORONARY ANGIOGRAPHY;  Surgeon: Jettie Booze, MD;  Location: [place] CV LAB;  Service: Cardiovascular;  Laterality: N/A;  ? SEPTOPLASTY N/A 02/05/2016  ? Procedure: SEPTOPLASTY WITH INTRANASAL SKIN GRAFT;  Surgeon: Rozetta Nunnery, MD;  Location: [place];  Service: ENT;  Laterality: N/A;  ? SHOULDER HEMI-ARTHROPLASTY Right 1987  ? "got hit by sled & dragged down sheet > 200 feet; tore shoulder up" (03/28/2013)  ? SHOULDER SURGERY Right 1970's  ? "pulled muscle apart; sewed it back together" (03/28/2013  ? TOTAL KNEE ARTHROPLASTY Right 2007  ? TOTAL KNEE ARTHROPLASTY  11/26/2012  ? Procedure: TOTAL KNEE ARTHROPLASTY;  Surgeon: Gearlean Alf, MD;  Location: [facility];  Service: Orthopedics;  Laterality: Left;  ? TRANSURETHRAL RESECTION OF PROSTATE N/A 12/27/2017  ? Procedure: TRANSURETHRAL RESECTION OF THE PROSTATE (TURP);  Surgeon: Lucas Mallow, MD;  Location[facility];  Service: Urology;  Laterality: N/A;  ? ? ? ?Current Outpatient Medications  ?Medication Sig Dispense Refill  ? amiodarone (PACERONE) 200 MG tablet TAKE 1 TABLET BY MOUTH DAILY 90 tablet 3  ? apixaban (ELIQUIS) 2.5 MG TABS tablet TAKE 1 TABLET BY MOUTH  TWICE DAILY 180 tablet 1  ? atorvastatin (LIPITOR) 10 MG tablet TAKE 1 TABLET BY MOUTH  DAILY AT 6 PM. 90 tablet 3  ? ENTRESTO 24-26 MG TAKE 1 TABLET BY MOUTH  TWICE DAILY 180 tablet 3  ? furosemide (LASIX) 40 MG tablet Take 1 tablet (40 mg total) by mouth daily. 90 tablet 3  ? latanoprost (XALATAN) 0.005 % ophthalmic solution Place 1 drop into both eyes at bedtime.    ? Multiple Vitamins-Minerals (PRESERVISION AREDS 2+MULTI VIT PO) Take 1 capsule by mouth in the morning and at  bedtime.    ? nitroGLYCERIN (NITROSTAT) 0.4 MG SL tablet Place 0.4 mg under the tongue every 5 (five) minutes as needed for chest pain.    ? Omega-3 Fatty Acids (FISH OIL) 1000 MG CAPS Take 1,000 mg by mouth daily.    ? timolol (TIMOPTIC) 0.5 % ophthalmic solution Place 1 drop into the right eye 2 (two) times daily.    ? ?Current Facility-Administered Medications  ?Medication Dose Route Frequency Provider Last Rate Last Admin  ? betamethasone acetate-betamethasone sodium phosphate (CELESTONE) injection 3 mg  3 mg Intramuscular Once Edrick Kins, DPM      ? ? ?Allergies:   Penicillins and Hydromorphone hcl  ? ? ?Social History:  The patient  reports that he quit smoking about 48 years ago. His smoking use included cigarettes. He  has a 26.00 pack-year smoking history. He has never used smokeless tobacco. He reports that he does not drink alcohol and does not use drugs.  ? ?Family History:  The patient's family history includes CAD (age of onset: 7) in his brother; Cancer in an other family member; Heart disease in an other family member; Stroke in his brother.  ? ? ?ROS:  Please see the history of present illness.   Otherwise, review of systems are positive for lower extremity swelling, worse at the end of the day.  Dyspnea on exertion.   All other systems are reviewed and negative.  ? ? ?PHYSICAL EXAM: ?VS:  BP 128/80   Pulse 74   Ht 5\' 8"  (1.727 m)   Wt 194 lb 6.4 oz (88.2 kg)   SpO2 99%   BMI 29.56 kg/m?  , BMI Body mass index is 29.56 kg/m?. ?GEN: Well nourished, well developed, in no acute distress ?HEENT: normal ?Neck: no JVD, carotid bruits, or masses ?Cardiac: RRR; 3/6 systolic murmurs, rubs, or gallops, bilateral trace ankle edema  ?Respiratory:  clear to auscultation bilaterally, normal work of breathing ?GI: soft, nontender, nondistended, + BS ?MS: no deformity or atrophy ?Skin: warm and dry, no rash ?Neuro:  Strength and sensation are intact ?Psych: euthymic mood, full affect ? ? ?Recent  Labs: ?07/12/2021: ALT 10; TSH 4.250 ?01/11/2022: NT-Pro BNP 636; Platelets 170 ?01/27/2022: Hemoglobin 12.2 ?02/08/2022: BUN 22; Creatinine, Ser 1.62; Potassium 5.1; Sodium 140  ? ?Lipid Panel ?   ?Component Value Date/Ti

## 2022-03-02 ENCOUNTER — Encounter: Payer: Self-pay | Admitting: Interventional Cardiology

## 2022-03-02 ENCOUNTER — Ambulatory Visit (INDEPENDENT_AMBULATORY_CARE_PROVIDER_SITE_OTHER): Payer: Medicare Other | Admitting: Interventional Cardiology

## 2022-03-02 VITALS — BP 128/80 | HR 74 | Ht 68.0 in | Wt 194.4 lb

## 2022-03-02 DIAGNOSIS — I2510 Atherosclerotic heart disease of native coronary artery without angina pectoris: Secondary | ICD-10-CM

## 2022-03-02 DIAGNOSIS — I4821 Permanent atrial fibrillation: Secondary | ICD-10-CM | POA: Diagnosis not present

## 2022-03-02 DIAGNOSIS — I359 Nonrheumatic aortic valve disorder, unspecified: Secondary | ICD-10-CM

## 2022-03-02 DIAGNOSIS — N1832 Chronic kidney disease, stage 3b: Secondary | ICD-10-CM | POA: Diagnosis not present

## 2022-03-02 DIAGNOSIS — I340 Nonrheumatic mitral (valve) insufficiency: Secondary | ICD-10-CM

## 2022-03-02 DIAGNOSIS — I10 Essential (primary) hypertension: Secondary | ICD-10-CM

## 2022-03-02 DIAGNOSIS — I5032 Chronic diastolic (congestive) heart failure: Secondary | ICD-10-CM | POA: Diagnosis not present

## 2022-03-02 NOTE — Patient Instructions (Addendum)
Medication Instructions:  ?Your physician recommends that you continue on your current medications as directed. Please refer to the Current Medication list given to you today. ?Can skip furosemide a couple days per week if you are not having shortness of breath or swelling.  If you do start developing shortness of breath, swelling or weight gain be sure to take furosemide every day.  ?*If you need a refill on your cardiac medications before your next appointment, please call your pharmacy* ? ? ?Lab Work: ?none ?If you have labs (blood work) drawn today and your tests are completely normal, you will receive your results only by: ?MyChart Message (if you have MyChart) OR ?A paper copy in the mail ?If you have any lab test that is abnormal or we need to change your treatment, we will call you to review the results. ? ? ?Testing/Procedures: ?none ? ? ?Follow-Up: ?At [facility], you and your health needs are our priority.  As part of our continuing mission to provide you with exceptional heart care, we have created designated Provider Care Teams.  These Care Teams include your primary Cardiologist (physician) and Advanced Practice Providers (APPs -  Physician Assistants and Nurse Practitioners) who all work together to provide you with the care you need, when you need it. ? ?We recommend signing up for the patient portal called "MyChart".  Sign up information is provided on this After Visit Summary.  MyChart is used to connect with patients for Virtual Visits (Telemedicine).  Patients are able to view lab/test results, encounter notes, upcoming appointments, etc.  Non-urgent messages can be sent to your provider as well.   ?To learn more about what you can do with MyChart, go to [email or web address].   ? ?Your next appointment:   ?September 05, 2022 at 8:00 ? ?The format for your next appointment:   ?In Person ? ?Provider:   ?Lance Muss, MD   ? ? ?Other Instructions ?  ? ?Important Information About  [place] ? ? ? ? ? ? ?

## 2022-03-30 ENCOUNTER — Institutional Professional Consult (permissible substitution) (INDEPENDENT_AMBULATORY_CARE_PROVIDER_SITE_OTHER): Payer: Medicare Other | Admitting: Surgery

## 2022-03-30 ENCOUNTER — Encounter: Payer: Self-pay | Admitting: Surgery

## 2022-03-30 VITALS — BP 122/80 | HR 85 | Resp 20 | Ht 68.0 in | Wt 194.0 lb

## 2022-03-30 DIAGNOSIS — I350 Nonrheumatic aortic (valve) stenosis: Secondary | ICD-10-CM | POA: Diagnosis not present

## 2022-03-30 NOTE — Progress Notes (Signed)
Patient ID: Gregory Mccormick, male   DOB: 1928-11-06, 88 y.o.   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  He continues to mow his yard and rides on a tractor and does a lot of work around his property.  He has developed increasing shortness of breath over the past several months as well as fatigue and tiredness.  He has to stop multiple times while walking his dog.  He has had some dizziness but no syncope particularly when standing up.  He denies any chest pain or pressure.  He has had no peripheral edema.  He is here today with his son. Past Medical History:  Diagnosis Date   Anemia, unspecified    Aortic stenosis    Arthritis    "right knee" (03/28/2013   CAD in native artery    takes Xarelto daily   Cardiomyopathy ([facility])    EF 40%:  ECHO 11/25/2015 EF 55%-60%   Carpal tunnel syndrome, bilateral    Chronic systolic CHF (congestive heart failure) ([facility])    CKD (chronic kidney disease), stage II    Epistaxis    Essential hypertension, benign    takes Metoprolol daily   H/O hiatal hernia    History of kidney stones    Hyperlipidemia    takes Lipitor daily   Nonspecific  abnormal unspecified cardiovascular function study    OSA on CPAP    wears CPAP 02/03/16   Pacemaker    MDT   PAF (paroxysmal atrial fibrillation) ([facility])    Paroxysmal ventricular tachycardia ([facility])    PONV (postoperative nausea and vomiting)    PVC's (premature ventricular contractions)    Vitamin D deficiency     Past Surgical History:  Procedure Laterality Date   CARDIAC CATHETERIZATION  03/21/2013   CARDIOVERSION N/A 10/09/2018   Procedure: CARDIOVERSION;  Surgeon: Josue Hector, MD;  Location: [facility];  Service: Cardiovascular;  Laterality: N/A;   CARPAL TUNNEL RELEASE Right 02/05/2016   Procedure: RIGHT LIMITED OPEN CARPAL TUNNEL RELEASE;  Surgeon: Roseanne Kaufman, MD;  Location: [facility];  Service: Orthopedics;  Laterality: Right;   CARPAL TUNNEL RELEASE Left 04/21/2016   Procedure: CARPAL TUNNEL RELEASE;  Surgeon: Roseanne Kaufman, MD;  Location: [facility];  Service: Orthopedics;  Laterality: Left;   CATARACT EXTRACTION W/ INTRAOCULAR LENS  IMPLANT, BILATERAL Bilateral 2000's   COLONOSCOPY     CORONARY ANGIOPLASTY WITH STENT PLACEMENT  03/28/2013   "3 stents" (03/28/2013)   HIP ARTHROPLASTY Right 04/30/2019   Procedure: ARTHROPLASTY BIPOLAR HIP (HEMIARTHROPLASTY);  Surgeon: Paralee Cancel, MD;  Location: [facility];  Service: Orthopedics;  Laterality: Right;   INSERT / REPLACE / REMOVE  PACEMAKER     KNEE CARTILAGE SURGERY Left 1972   "cut it open" (03/28/2013)   KNEE CARTILAGE SURGERY Right ~ 1977   "cut it open" (03/28/2013)   PACEMAKER INSERTION  10/05/10   MDT Adapta L implanted by Dr Rayann Heman for mobitz II second degree AV block   PERCUTANEOUS CORONARY ROTOBLATOR INTERVENTION (PCI-R) N/A 03/28/2013   Procedure: PERCUTANEOUS CORONARY ROTOBLATOR INTERVENTION (PCI-R);  Surgeon: Jettie Booze, MD;  Location: [facility];  Service: Cardiovascular;  Laterality: N/A;   RIGHT/LEFT HEART CATH AND CORONARY ANGIOGRAPHY N/A 01/27/2022   Procedure: RIGHT/LEFT HEART CATH AND CORONARY ANGIOGRAPHY;   Surgeon: Jettie Booze, MD;  Location: [facility];  Service: Cardiovascular;  Laterality: N/A;   SEPTOPLASTY N/A 02/05/2016   Procedure: SEPTOPLASTY WITH INTRANASAL SKIN GRAFT;  Surgeon: Rozetta Nunnery, MD;  Location: [facility];  Service: ENT;  Laterality: N/A;   SHOULDER HEMI-ARTHROPLASTY Right 1987   "got hit by sled & dragged down sheet > 200 feet; tore shoulder up" (03/28/2013)   SHOULDER SURGERY Right 1970's   "pulled muscle apart; sewed it back together" (03/28/2013   TOTAL KNEE ARTHROPLASTY Right 2007   TOTAL KNEE ARTHROPLASTY  11/26/2012   Procedure: TOTAL KNEE ARTHROPLASTY;  Surgeon: Gearlean Alf, MD;  Location: [facility];  Service: Orthopedics;  Laterality: Left;   TRANSURETHRAL RESECTION OF PROSTATE N/A 12/27/2017   Procedure: TRANSURETHRAL RESECTION OF THE PROSTATE (TURP);  Surgeon: Lucas Mallow, MD;  Location[facility];  Service: Urology;  Laterality: N/A;    Family History  Problem Relation Age of Onset   CAD Brother 57   Stroke Brother    Cancer Other    Heart disease Other    Heart attack Neg Hx    Hypertension Neg Hx     Social History   Socioeconomic History   Marital status: Married    Spouse name: Not on file   Number of children: Not on file   Years of education: Not on file   Highest education level: Not on file  Occupational History   Occupation: retired    Fish farm manager: [employer]  Tobacco Use   Smoking status: Former    Packs/day: 1.00    Years: 26.00    Pack years: 26.00    Types: Cigarettes    Quit date: 10/31/1973    Years since quitting: 48.4   Smokeless tobacco: Never  Vaping Use   Vaping Use: Never used  Substance and Sexual Activity   Alcohol use: No   Drug use: No   Sexual activity: Never  Other Topics Concern   Not on file  Social History Narrative   Not on file   Social Determinants of Health   Financial Resource Strain: Not on file  Food Insecurity: Not on file  Transportation Needs: Not  on file  Physical Activity: Not on file  Stress: Not on file  Social Connections: Not on file  Intimate Partner Violence: Not on file    Prior to Admission medications   Medication Sig Start Date End Date Taking? Authorizing Provider  amiodarone (PACERONE) 200 MG tablet TAKE 1 TABLET BY MOUTH DAILY 02/15/22  Yes Jettie Booze, MD  apixaban (ELIQUIS) 2.5 MG TABS tablet TAKE 1 TABLET BY MOUTH  TWICE DAILY 12/06/21  Yes Allred, Jeneen Rinks, MD  atorvastatin (LIPITOR) 10 MG tablet TAKE 1 TABLET BY MOUTH  DAILY AT 6 PM. 12/06/21  Yes Jettie Booze, MD  ENTRESTO 24-26 MG TAKE 1  TABLET BY MOUTH  TWICE DAILY 02/28/22  Yes Early Osmond, MD  furosemide (LASIX) 40 MG tablet Take 1 tablet (40 mg total) by mouth daily. 01/13/22  Yes Jettie Booze, MD  latanoprost (XALATAN) 0.005 % ophthalmic solution Place 1 drop into both eyes at bedtime. 11/28/20  Yes [provider]  Multiple Vitamins-Minerals (PRESERVISION AREDS 2+MULTI VIT PO) Take 1 capsule by mouth in the morning and at bedtime.   Yes [provider]  nitroGLYCERIN (NITROSTAT) 0.4 MG SL tablet Place 0.4 mg under the tongue every 5 (five) minutes as needed for chest pain.   Yes [provider]  Omega-3 Fatty Acids (FISH OIL) 1000 MG CAPS Take 1,000 mg by mouth daily.   Yes [provider]  timolol (TIMOPTIC) 0.5 % ophthalmic solution Place 1 drop into the right eye 2 (two) times daily. 01/19/22  Yes [provider]    Current Outpatient Medications  Medication Sig Dispense Refill   amiodarone (PACERONE) 200 MG tablet TAKE 1 TABLET BY MOUTH DAILY 90 tablet 3   apixaban (ELIQUIS) 2.5 MG TABS tablet TAKE 1 TABLET BY MOUTH  TWICE DAILY 180 tablet 1   atorvastatin (LIPITOR) 10 MG tablet TAKE 1 TABLET BY MOUTH  DAILY AT 6 PM. 90 tablet 3   ENTRESTO 24-26 MG TAKE 1 TABLET BY MOUTH  TWICE DAILY 180 tablet 3   furosemide (LASIX) 40 MG tablet Take 1 tablet (40 mg total) by mouth daily. 90 tablet 3    latanoprost (XALATAN) 0.005 % ophthalmic solution Place 1 drop into both eyes at bedtime.     Multiple Vitamins-Minerals (PRESERVISION AREDS 2+MULTI VIT PO) Take 1 capsule by mouth in the morning and at bedtime.     nitroGLYCERIN (NITROSTAT) 0.4 MG SL tablet Place 0.4 mg under the tongue every 5 (five) minutes as needed for chest pain.     Omega-3 Fatty Acids (FISH OIL) 1000 MG CAPS Take 1,000 mg by mouth daily.     timolol (TIMOPTIC) 0.5 % ophthalmic solution Place 1 drop into the right eye 2 (two) times daily.     Current Facility-Administered Medications  Medication Dose Route Frequency Provider Last Rate Last Admin   betamethasone acetate-betamethasone sodium phosphate (CELESTONE) injection 3 mg  3 mg Intramuscular Once Edrick Kins, DPM        Allergies  Allergen Reactions   Penicillins Hives    Has tolerated Keflex      Has patient had a PCN reaction causing immediate rash, facial/tongue/throat swelling, SOB or lightheadedness with hypotension: YES Has patient had a PCN reaction causing severe rash involving mucus membranes or skin necrosis: No Has patient had a PCN reaction that required hospitalization No Has patient had a PCN reaction occurring within the last 10 years: No If all of the above answers are "NO", then may proceed with Cephalosporin use.    Hydromorphone Hcl Nausea And Vomiting      Review of Systems:   General:  normal appetite, + decreased energy, no weight gain, no weight loss, no fever  Cardiac:  no chest pain with exertion, no chest pain at rest, + SOB with mild exertion, no resting SOB, no PND, no orthopnea, no palpitations, + arrhythmia, + atrial fibrillation, no LE edema, + dizzy spells, no syncope  Respiratory:  + exertional shortness of breath, no home oxygen, no productive cough, no dry cough, no bronchitis, no wheezing, no hemoptysis, no asthma, no pain with inspiration or cough, + sleep apnea, + CPAP at  night  GI:   no difficulty swallowing, no  reflux, no frequent heartburn, no hiatal hernia, no abdominal pain, no constipation, no diarrhea, no hematochezia, no hematemesis, no melena  GU:   no dysuria,  no frequency, no urinary tract infection, no hematuria, no enlarged prostate, no kidney stones, + chronic kidney disease  Vascular:  no pain suggestive of claudication, no pain in feet, no leg cramps, no varicose veins, no DVT, no non-healing foot ulcer  Neuro:   no stroke, no TIA's, no seizures, no headaches, no temporary blindness one eye,  no slurred speech, no peripheral neuropathy, no chronic pain, no instability of gait, no memory/cognitive dysfunction  Musculoskeletal: + arthritis, no joint swelling, no myalgias, no difficulty walking, normal mobility   Skin:   no rash, no itching, no skin infections, no pressure sores or ulcerations  Psych:   no anxiety, no depression, no nervousness, no unusual recent stress  Eyes:   no blurry vision, no floaters, no recent vision changes, + wears glasses  ENT:   no hearing loss, no loose or painful teeth, no dentures, last saw dentist every 6 months  Hematologic:  no easy bruising, no abnormal bleeding, no clotting disorder, no frequent epistaxis  Endocrine:  no diabetes, does not check CBG's at home     Physical Exam:   BP 122/80 (BP Location: Right Arm, Patient Position: Sitting)   Pulse 85   Resp 20   Ht 5\' 8"  (1.727 m)   Wt 194 lb (88 kg)   SpO2 91% Comment: RA  BMI 29.50 kg/m   General:  Elderly but  well-appearing and looks younger than age  65:  Unremarkable, NCAT, PERLA, EOMI  Neck:   no JVD, no bruits, no adenopathy   Chest:   clear to auscultation, symmetrical breath sounds, no wheezes, no rhonchi   CV:   RRR, 3/6 systolic murmur RSB, no diastolic murmur  Abdomen:  soft, non-tender, no masses   Extremities:  warm, well-perfused, pulses palpable at ankle, no lower extremity edema  Rectal/GU  Deferred  Neuro:   Grossly non-focal and symmetrical throughout  Skin:   Clean  and dry, no rashes, no breakdown  Diagnostic Tests:  ECHOCARDIOGRAM REPORT         Patient Name:   Gregory Mccormick Date of Exam: 01/24/2022  Medical Rec #:  [id number]      Height:       68.0 in  Accession #:    [id number]     Weight:       192.0 lb  Date of Birth:  [birth year]      BSA:          2.009 m  Patient Age:    40 years       BP:           108/68 mmHg  Patient Gender: M              HR:           84 bpm.  Exam Location:  [address]   Procedure: 2D Echo, 3D Echo, Cardiac Doppler, Color Doppler and Strain  Analysis   Indications:    I35 Aortic stenosis     History:        Patient has prior history of Echocardiogram examinations,  most                  recent 02/24/2021. Cardiomyopathy and CHF, CAD, Pacemaker,  Aortic  Valve Disease, Arrythmias:2nd degree AV block. VT and  Atrial                  Fibrillation, Signs/Symptoms:Syncope; Risk  Factors:Hypertension,                  Sleep Apnea, Dyslipidemia and Former Smoker.     Sonographer:    Basilia Jumbo BS, RDCS  Referring Phys: [place]     1. The aortic valve is tricuspid. There is severe calcifcation of the  aortic valve. There is severe thickening of the aortic valve. Aortic valve  regurgitation is trivial. There is severe aortic valve stenosis with AVA  0.9cm2, mean gradient 45mmHg, Vmax  3.24m/s, DI 0.21. Trivial aortic regurgitation.   2. The mitral valve is degenerative. There is prolapse and with a  possible flail segment of the posterior mitral valve leaflet. There is  likely severe, anteriorly directed mitral regurgitation with significant  splay artifact. Moderate mitral annular  calcification.   3. Left ventricular ejection fraction, by estimation, is 55 to 60%. The  left ventricle has normal function. The left ventricle has no regional  wall motion abnormalities. There is mild concentric left ventricular  hypertrophy. Left ventricular diastolic   parameters are indeterminate. Elevated left atrial pressure. The average  left ventricular global longitudinal strain is -19.1 %. The global  longitudinal strain is normal.   4. Right ventricular systolic function is mildly reduced. The right  ventricular size is normal. There is mildly elevated pulmonary artery  systolic pressure. The estimated right ventricular systolic pressure is  [id number] mmHg.   5. Left atrial size was mild to moderately dilated.   6. Tricuspid valve regurgitation is mild to moderate.   7. Aortic dilatation noted. There is mild dilatation of the ascending  aorta, measuring 39 mm.   8. The inferior vena cava is normal in size with greater than 50%  respiratory variability, suggesting right atrial pressure of 3 mmHg.   Comparison(s): Prior TTE on 02/24/21 with EF 60-65%. Severe AS 12mmHg mean  PG, 35mmHg peak PG. Moderate MR. Compared to prior TTE, LVEF appears  slightly less vigorous (55-60%), MR now appears severe, AS continues to  appear severe.   FINDINGS   Left Ventricle: Left ventricular ejection fraction, by estimation, is 55  to 60%. The left ventricle has normal function. The left ventricle has no  regional wall motion abnormalities. The average left ventricular global  longitudinal strain is -19.1 %.  The global longitudinal strain is normal. 3D left ventricular ejection  fraction analysis performed but not reported based on interpreter  judgement due to suboptimal tracking. The left ventricular internal cavity  size was normal in size. There is mild  concentric left ventricular hypertrophy. Left ventricular diastolic  parameters are indeterminate. Elevated left atrial pressure.   Right Ventricle: The right ventricular size is normal. No increase in  right ventricular wall thickness. Right ventricular systolic function is  mildly reduced. There is mildly elevated pulmonary artery systolic  pressure. The tricuspid regurgitant velocity   is 3.05 m/s, and  with an assumed right atrial pressure of 3 mmHg, the  estimated right ventricular systolic pressure is Q000111Q mmHg.   Left Atrium: Left atrial size was mild to moderately dilated.   Right Atrium: Right atrial size was normal in size.   Pericardium: There is no evidence of pericardial effusion.   Mitral Valve: EROA 0.4cm2, RVol 58mL. The mitral valve is degenerative in  appearance.  There is mild thickening of the mitral valve leaflet(s). There  is mild calcification of the mitral valve leaflet(s). Moderate mitral  annular calcification. There is  prolapse and with a possible flail segment of the posterior mitral valve  leaflet. There is likely severe, anteriorly directed mitral regurgitation  with significant splay artifact. mitral valve regurgitation.   Tricuspid Valve: The tricuspid valve is normal in structure. Tricuspid  valve regurgitation is mild to moderate.   Aortic Valve: The aortic valve is tricuspid. There is severe calcifcation  of the aortic valve. There is severe thickening of the aortic valve.  Aortic valve regurgitation is trivial. Severe aortic stenosis is present.  Aortic valve mean gradient measures  31.0 mmHg. Aortic valve peak gradient measures 58.2 mmHg. Aortic valve  area, by VTI measures 1.11 cm.   Pulmonic Valve: The pulmonic valve was normal in structure. Pulmonic valve  regurgitation is trivial.   Aorta: The aortic root is normal in size and structure and aortic  dilatation noted. There is mild dilatation of the ascending aorta,  measuring 39 mm.   Venous: The inferior vena cava is normal in size with greater than 50%  respiratory variability, suggesting right atrial pressure of 3 mmHg.   IAS/Shunts: The atrial septum is grossly normal.   Additional Comments: A device lead is visualized.      LEFT VENTRICLE  PLAX 2D  LVIDd:         4.90 cm   Diastology  LVIDs:         3.70 cm   LV e' medial:    8.05 cm/s  LV PW:         1.10 cm   LV E/e' medial:   16.8  LV IVS:        1.10 cm   LV e' lateral:   9.36 cm/s  LVOT diam:     2.80 cm   LV E/e' lateral: 14.4  LV SV:         91  LV SV Index:   45        2D Longitudinal Strain  LVOT Area:     6.16 cm  2D Strain GLS Avg:     -19.1 %                              3D Volume EF:                           3D EF:        41 %                           LV EDV:       116 ml                           LV ESV:       68 ml                           LV SV:        48 ml   RIGHT VENTRICLE            IVC  RV Basal diam:  3.70 cm    IVC diam: 1.60 cm  RV S prime:     9.68 cm/s  TAPSE (  M-mode): 1.9 cm  RVSP:           40.2 mmHg   LEFT ATRIUM             Index        RIGHT ATRIUM           Index  LA diam:        4.10 cm 2.04 cm/m   RA Pressure: 3.00 mmHg  LA Vol (A2C):   46.1 ml 22.95 ml/m  RA Area:     17.60 cm  LA Vol (A4C):   76.0 ml 37.84 ml/m  RA Volume:   51.20 ml  25.49 ml/m  LA Biplane Vol: 59.7 ml 29.72 ml/m   AORTIC VALVE  AV Area (Vmax):    1.12 cm  AV Area (Vmean):   1.13 cm  AV Area (VTI):     1.11 cm  AV Vmax:           381.50 cm/s  AV Vmean:          250.200 cm/s  AV VTI:            0.815 m  AV Peak Grad:      58.2 mmHg  AV Mean Grad:      31.0 mmHg  LVOT Vmax:         69.20 cm/s  LVOT Vmean:        46.000 cm/s  LVOT VTI:          0.147 m  LVOT/AV VTI ratio: 0.18     AORTA  Ao Root diam: 3.40 cm  Ao Asc diam:  3.90 cm   MITRAL VALVE                  TRICUSPID VALVE                                TR Peak grad:   37.2 mmHg  MV Decel Time: 176 msec       TR Vmax:        305.00 cm/s  MR Peak grad:    94.5 mmHg    Estimated RAP:  3.00 mmHg  MR Mean grad:    54.5 mmHg    RVSP:           40.2 mmHg  MR Vmax:         486.00 cm/s  MR Vmean:        339.5 cm/s   SHUNTS  MR PISA:         3.53 cm     Systemic VTI:  0.15 m  MR PISA Eff ROA: 29 mm       Systemic Diam: 2.80 cm  MR PISA Radius:  0.75 cm  MV E velocity: 135.00 cm/s  MV A velocity: 90.70 cm/s  MV E/A ratio:   1.49   Gwyndolyn Kaufman MD  Electronically signed by Gwyndolyn Kaufman MD  Signature Date/Time: 01/24/2022/12:37:04 PM         Final     Physicians  Panel Physicians Referring Physician Case Authorizing Physician  Jettie Booze, MD (Primary)     Procedures  RIGHT/LEFT HEART CATH AND CORONARY ANGIOGRAPHY   Conclusion      Ost Cx to Prox Cx lesion is 60% stenosed.   2nd Mrg lesion is 80% stenosed.   Prox RCA to Mid RCA lesion is 25% stenosed.   Previously placed Prox LAD to  Mid LAD stent (unknown type) is  widely patent.   LV end diastolic pressure is normal.   Hemodynamic findings consistent with mild pulmonary hypertension.   There is moderate aortic valve stenosis.   Aortic saturation 94%, PA saturation 66%, PA pressure 43/11, mean PA pressure 26 mmHg, mean pulmonary capillary wedge pressure 12 mmHg, prominent V waves noted on the wedge tracing; cardiac output 4.87 L/min, cardiac index 2.4.   CAD noted in the circumflex system.  Given the patient's lack of angina, intervention was deferred.  Continue with plans for TAVR work-up.   Contrast administration limited due to renal insufficiency.   Results conveyed to the patient's son, August Saucer [phone].   Recommendations  Discharge Date In the absence of any other complications or medical issues, we expect the patient to be ready for discharge from a cath perspective on 01/27/2022.   Indications  Severe aortic stenosis [I35.0 (ICD-10-CM)]   Clinical Presentation  CHF/Shock Congestive heart failure not present. No shock present.   Procedural Details  Technical Details The risks, benefits, and details of the procedure were explained to the patient.  The patient verbalized understanding and wanted to proceed.  Informed written consent was obtained.  PROCEDURE TECHNIQUE:  After Xylocaine anesthesia, a 5 French sheath was placed in the right antecubital areain exchange for a peripheral IV. A 5 French balloontipped  Swan-Ganz catheter was advanced to the pulmonary artery under fluoroscopic guidance. Hemodynamic pressures were obtained. Oxygen saturations were obtained. After Xylocaine anesthesia, a 44F sheath was placed in the right radial artery with a single anterior needle wall stick using ultrasound guidance.     Left heart cath was done using a JR4 catheter. Right coronary angiography was done using a Judkins R4 guide catheter.  Left coronary angiography was done using a Judkins L4 guide catheter.     Contrast: 30 cc   Estimated blood loss <50 mL.   During this procedure medications were administered to achieve and maintain moderate conscious sedation while the patient's heart rate, blood pressure, and oxygen saturation were continuously monitored and I was present face-to-face 100% of this time.   Medications (Filter: Administrations occurring from 1247 to 1400 on 01/27/22) Heparin (Porcine) in NaCl 1000-0.9 UT/500ML-% SOLN (mL) Total volume:  1,000 mL Date/Time Rate/Dose/Volume Action   01/27/22 1252 500 mL Given   1252 500 mL Given    fentaNYL (SUBLIMAZE) injection (mcg) Total dose:  25 mcg Date/Time Rate/Dose/Volume Action   01/27/22 1305 25 mcg Given    midazolam (VERSED) injection (mg) Total dose:  1 mg Date/Time Rate/Dose/Volume Action   01/27/22 1305 1 mg Given    lidocaine (PF) (XYLOCAINE) 1 % injection (mL) Total volume:  3 mL Date/Time Rate/Dose/Volume Action   01/27/22 1321 1 mL Given   1321 2 mL Given    Radial Cocktail/Verapamil only (mL) Total volume:  12 mL Date/Time Rate/Dose/Volume Action   01/27/22 1324 10 mL Given   1348 2 mL Given    iohexol (OMNIPAQUE) 350 MG/ML injection (mL) Total volume:  30 mL Date/Time Rate/Dose/Volume Action   01/27/22 1358 30 mL Given    heparin sodium (porcine) injection (Units) Total dose:  4,500 Units Date/Time Rate/Dose/Volume Action   01/27/22 1329 4,500 Units Given    Contrast  Medication Name Total Dose  iohexol  (OMNIPAQUE) 350 MG/ML injection 30 mL   Radiation/Fluoro  Fluoro time: 3.7 (min) DAP: 13.4 (Gycm2) Cumulative Air Kerma: 279 (mGy) Complications  Complications documented before study signed (01/27/2022  2:07 PM)  RIGHT/LEFT HEART CATH AND CORONARY ANGIOGRAPHY  None Documented by Alveda Reasons, RT 01/27/2022  1:55 PM  Date Found: 01/27/2022  Time Range: Intraprocedure       Coronary Findings  Diagnostic Dominance: Right Left Anterior Descending  Previously placed Prox LAD to Mid LAD stent (unknown type) is widely patent.    Left Circumflex  Ost Cx to Prox Cx lesion is 60% stenosed. The lesion is eccentric.    Second Obtuse Marginal Branch  Vessel is large in size.  2nd Mrg lesion is 80% stenosed.    Right Coronary Artery  Prox RCA to Mid RCA lesion is 25% stenosed.    Intervention   No interventions have been documented.   Right Heart  Right Heart Pressures Hemodynamic findings consistent with mild pulmonary hypertension. Aortic saturation 94%, PA saturation 66%, PA pressure 43/11, mean PA pressure 26 mmHg, mean pulmonary capillary wedge pressure 12 mmHg, prominent V waves noted on the wedge tracing; cardiac output 4.87 L/min, cardiac index 2.4.   Left Heart  Left Ventricle LV end diastolic pressure is normal.  Aortic Valve There is moderate aortic valve stenosis.   Coronary Diagrams  Diagnostic Dominance: Right Intervention  Implants     No implant documentation for this case.   Syngo Images   Show images for CARDIAC CATHETERIZATION Images on Long Term Storage   Show images for Demarco, [place] to Procedure Log  Procedure Log    Hemo Data  Flowsheet Row Most Recent Value  Fick Cardiac Output 4.87 L/min  Fick Cardiac Output Index 2.43 (L/min)/BSA  Aortic Mean Gradient 20.38 mmHg  Aortic Peak Gradient 10.5 mmHg  Aortic Valve Area 1.14  Aortic Value Area Index 0.57 cm2/BSA  RA A Wave 9 mmHg  RA V Wave 5 mmHg  RA Mean 4 mmHg  RV Systolic  Pressure 43 mmHg  RV Diastolic Pressure 1 mmHg  RV EDP 11 mmHg  PA Systolic Pressure 43 mmHg  PA Diastolic Pressure 11 mmHg  PA Mean 26 mmHg  PW A Wave 16 mmHg  PW V Wave 23 mmHg  PW Mean 12 mmHg  AO Systolic Pressure 129 mmHg  AO Diastolic Pressure 57 mmHg  AO Mean 86 mmHg  LV Systolic Pressure 163 mmHg  LV Diastolic Pressure 2 mmHg  LV EDP 13 mmHg  AOp Systolic Pressure 147 mmHg  AOp Diastolic Pressure 106 mmHg  AOp Mean Pressure 103 mmHg  LVp Systolic Pressure 169 mmHg  LVp Diastolic Pressure 2 mmHg  LVp EDP Pressure 20 mmHg  QP/QS 1  TPVR Index 10.73 HRUI  TSVR Index 35.46 HRUI  PVR SVR Ratio 0.17  TPVR/TSVR Ratio 0.3    ADDENDUM REPORT: 02/08/2022 14:18   CLINICAL DATA:  Aortic Valve pathology with assessment for TAVR   EXAM: Cardiac TAVR CT   TECHNIQUE: The patient was scanned on a Siemens Force 192 slice scanner. A 120 kV retrospective scan was triggered in the descending thoracic aorta at 111 HU's. Gantry rotation speed was 270 msecs and collimation was .9 mm. No beta blockade or nitro were given. The 3D data set was reconstructed in 5% intervals of the R-R cycle. Systolic and diastolic phases were analyzed on a dedicated work station using MPR, MIP and VRT modes. The patient received 80 cc of contrast.   FINDINGS: Aortic Valve: Severely thickened aortic valve with heavy calcification and reduced excursion the planimeter valve area is 1.0 Sq cm consistent with severe aortic stenosis   Number of leaflets: 3  LVOT calcification: None   Annular calcification: None   Aortic Valve Calcium Score: 2479   Presence of basal septal hypertrophy: No   Perimembranous septal diameter: 11 mm   Aortic Annulus Measurements 20% Phase   Major annulus diameter: 29 mm   Minor annulus diameter: 26 mm   Annular perimeter: 84 mm   Annular area: 5.44 cm2   Aortic Root Measurements- 70% phase   Sinotubular Junction: 32 mm   Ascending Thoracic Aorta: 37 mm    Aortic Arch: 30 mm   Descending Thoracic Aorta: 29 mm   Aortic atherosclerosis.   Sinus of Valsalva Measurements:   Right coronary cusp width: 31 mm   Left coronary cusp width: 32 mm   Non coronary cusp width: 32 mm   Mean diameter: 32 mm   Coronary Artery Height above Annulus:   Left Main: 20 mm   Left SoV height: 16 mm   Right Coronary: 16 mm   Right SoV height: 20 mm   Optimum Fluoroscopic Angle for Delivery: LAO 3, CAU 1   Valves for structural team consideration:   Measurements at the Upper limit of a 26-mm Edwards Sapien   Sufficient Height and SOV Diameter for 29 mm CoreValve   Non TAVR Valve Findings:   Coronary Arteries: Large prox-mid LAD stent noted. Calcium scoring deferred.   CIED:   Course: There are 2 CIED leads present . There is a single lead that terminates in the right atrium, and a single lead that terminates in the right ventricle.   Superior Vena Cava: There are 2 CIED that course through the SVC. The RV leads is located centrally within the lumen. The RA leads is embedded within the SVC wall.   Right Atrium: There is a CIED lead that is embedded within the RA appendage. The CIED does not terminate beyond the RA wall.   Right Ventricle: There is a CIED lead the is embedded 2 cm lateral to the RV apex in the free wall. The CIED lead does not terminate beyond the RV wall.   Lead Fracture: No CIED lead fractures were noted.   Thrombus: No CIED lead thrombus was detected.   Dilation of the main pulmonary artery moderate, at 33 mm.   IMPRESSION: 1. Severe Aortic stenosis. Findings pertinent to TAVR procedure are detailed above.   Riley Lam MD     Electronically Signed   By: Riley Lam M.D.   On: 02/08/2022 14:18    Addended by Christell Constant, MD on 02/08/2022  2:21 PM   Study Result  Narrative & Impression  EXAM: OVER-READ INTERPRETATION  CT CHEST   The following report is an over-read  performed by radiologist Dr. Allegra Lai of [facility] on 02/08/2022. This over-read does not include interpretation of cardiac or coronary anatomy or pathology. The coronary calcium score/coronary CTA interpretation by the cardiologist is attached.   COMPARISON:  None.   FINDINGS: Extracardiac findings will be described separately under dictation for contemporaneously obtained CTA chest, abdomen and pelvis.   IMPRESSION: Please see separate dictation for contemporaneously obtained CTA chest, abdomen and pelvis dated 02/08/2022 for full description of relevant extracardiac findings.   Electronically Signed: By: Allegra Lai M.D. On: 02/08/2022 11:44    Narrative & Impression  CLINICAL DATA:  Preop evaluation for aortic valve replacement.   EXAM: CT ANGIOGRAPHY CHEST, ABDOMEN AND PELVIS   TECHNIQUE: Multidetector CT imaging through the chest, abdomen and pelvis was performed using the standard  protocol during bolus administration of intravenous contrast. Multiplanar reconstructed images and MIPs were obtained and reviewed to evaluate the vascular anatomy.   RADIATION DOSE REDUCTION: This exam was performed according to the departmental dose-optimization program which includes automated exposure control, adjustment of the mA and/or kV according to patient size and/or use of iterative reconstruction technique.   CONTRAST:  47mL OMNIPAQUE IOHEXOL 350 MG/ML SOLN   COMPARISON:  CT abdomen and pelvis dated August 21, 2017   FINDINGS: CTA CHEST FINDINGS   Cardiovascular: Cardiomegaly. No pericardial effusion. Aortic valve thickening and calcifications. Left chest wall dual lead pacer with leads in the right atrium and right ventricle. Moderate atherosclerotic disease of the thoracic aorta. Left main and three-vessel coronary artery calcifications with stents seen in the LAD and RCA. Standard 3 vessel aortic arch, arch vessels are patent with no  significant stenosis.   Mediastinum/Nodes: Large hiatal hernia containing stomach and transverse colon. Thyroid is unremarkable. No pathologically enlarged lymph nodes seen in the chest.   Lungs/Pleura: Central airways are patent. Bibasilar atelectasis. Mild centrilobular emphysema. No consolidation, pleural effusion pneumothorax.   Musculoskeletal: No chest wall abnormality. No acute or significant osseous findings.   CTA ABDOMEN AND PELVIS FINDINGS   Hepatobiliary: No focal liver abnormality is seen. Probable small gallstones. No gallbladder wall thickening. No biliary ductal dilation.   Pancreas: Unremarkable. No pancreatic ductal dilatation or surrounding inflammatory changes.   Spleen: No splenic injury or perisplenic hematoma.   Adrenals/Urinary Tract: Bilateral adrenal glands are unremarkable. No hydronephrosis or nephrolithiasis. Bilateral indeterminate lesions of the lower poles, right lower pole lesion measures 7 mm is not and is located on of image 163, left lower pole lesion measures 1.0 cm and is located on image 162. Additional bilateral low-attenuation renal lesions, largest are compatible with simple cysts, others are too small to completely characterize. Thick-walled and trabeculated urinary bladder.   Stomach/Bowel: Stomach is within normal limits. Appendix appears normal. No evidence of bowel wall thickening, distention, or inflammatory changes.   Vascular/lymphatic: Severe atherosclerotic disease of the abdominal aorta consisting calcified and noncalcified plaque. Severe narrowing at the origin of the celiac artery due to a combination of calcified plaque and diaphragmatic compression. Moderate narrowing at the origin of the SMA due to calcified and noncalcified plaque. Severe at the origin of the bilateral renal arteries due to calcified plaque. SMV is patent. No pathologically enlarged lymph nodes seen in the abdomen or pelvis.   Reproductive:  Prostatomegaly with TURP defect.   Other: No abdominal wall hernia or abnormality. No abdominopelvic ascites.   Musculoskeletal: Prior total right hip arthroplasty. No acute osseous abnormality.   VASCULAR MEASUREMENTS PERTINENT TO TAVR:   AORTA:   Minimal Aortic Diameter-13.8 mm   Severity of Aortic Calcification-severe   RIGHT PELVIS:   Right Common Iliac Artery -   Minimal Diameter-9.3 mm   Tortuosity-none   Calcification-moderate   Right External Iliac Artery -   Minimal Diameter-8.0 mm   Tortuosity-moderate   Calcification-mild   Right Common Femoral Artery -   Minimal Diameter-6.5 mm   Tortuosity-none   Calcification-mild   LEFT PELVIS:   Left Common Iliac Artery -   Minimal Diameter-10.2 mm   Tortuosity-none   Calcification-moderate   Left External Iliac Artery -   Minimal Diameter-8.2 mm   Tortuosity-moderate   Calcification-mild   Left Common Femoral Artery -   Minimal Diameter-7.0 mm   Tortuosity-none   Calcification-mild   Review of the MIP images confirms the above findings.  IMPRESSION: Vascular:   1. Vascular findings and measurements pertinent to potential TAVR procedure, as detailed above. 2. Severe thickening calcification of the aortic valve, compatible with reported clinical history of severe aortic stenosis. 3. Moderate to severe aortoiliac atherosclerosis. Left main and 3 vessel coronary artery disease. 4. Severe narrowing at the origin of the celiac artery and bilateral renal arteries.   Nonvascular:   1. Indeterminate bilateral lower pole renal lesions which are new when compared with 2018 prior exam, recommend further evaluation with ultrasound. 2. Thick-walled and trabeculated urinary bladder, likely due to chronic outlet obstruction.     Electronically Signed   By: Yetta Glassman M.D.   On: 02/08/2022 14:41    Impression:  This 88 year old active gentleman has stage D, severe, symptomatic  aortic stenosis with New York Heart Association class II symptoms of exertional fatigue and shortness of breath consistent with chronic diastolic congestive heart failure.  He has also had episodes of dizziness with standing.  I have personally reviewed his 2D echocardiogram, cardiac catheterization, and CTA studies.  His echocardiogram shows a trileaflet aortic valve with severe calcification and thickening and restricted mobility.  The mean gradient is 31 mmHg with a valve area of 1.1 cm by VTI.  Dimensionless index is 0.18.  Left ventricular ejection fraction is 55 to 60% with mild concentric LVH.  Cardiac catheterization shows patent previously placed stents in the proximal and mid LAD.  There is 60% proximal left circumflex and 80% second marginal stenosis.  He has had no anginal symptoms and this can be treated medically.  I agree that aortic valve replacement is indicated in this patient for relief of his lifestyle limiting symptoms and to prevent progressive left ventricular dysfunction and congestive heart failure.  Given his advanced age I think transcatheter aortic valve replacement would be the best treatment for him.  His gated cardiac CTA shows anatomy suitable for TAVR using a SAPIEN 3 valve.  His abdominal and pelvic CTA shows adequate pelvic vascular anatomy to allow transfemoral insertion.  The patient and his son were counseled at length regarding treatment alternatives for management of severe symptomatic aortic stenosis. The risks and benefits of surgical intervention has been discussed in detail. Long-term prognosis with medical therapy was discussed. Alternative approaches such as conventional surgical aortic valve replacement, transcatheter aortic valve replacement, and palliative medical therapy were compared and contrasted at length. This discussion was placed in the context of the patient's own specific clinical presentation and past medical history. All of their questions have been  addressed.   Following the decision to proceed with transcatheter aortic valve replacement, a discussion was held regarding what types of management strategies would be attempted intraoperatively in the event of life-threatening complications, including whether or not the patient would be considered a candidate for the use of cardiopulmonary bypass and/or conversion to open sternotomy for attempted surgical intervention.  Given his advanced age I would only consider emergent sternotomy to drain a pericardial effusion or repair a ventricular perforation.  The patient is aware of the fact that transient use of cardiopulmonary bypass may be necessary. The patient has been advised of a variety of complications that might develop including but not limited to risks of death, stroke, paravalvular leak, aortic dissection or other major vascular complications, aortic annulus rupture, device embolization, cardiac rupture or perforation, mitral regurgitation, acute myocardial infarction, arrhythmia, heart block or bradycardia requiring permanent pacemaker placement, congestive heart failure, respiratory failure, renal failure, pneumonia, infection, other late complications related to structural  valve deterioration or migration, or other complications that might ultimately cause a temporary or permanent loss of functional independence or other long term morbidity. The patient provides full informed consent for the procedure as described and all questions were answered.      Plan:   He will be scheduled for transfemoral TAVR using a SAPIEN 3 valve.  His Eliquis will need to be stopped 5 days preoperatively.  I spent 60 minutes performing this consultation and > 50% of this time was spent face to face counseling and coordinating the care of this patient's severe symptomatic aortic stenosis.   Gaye Pollack, MD 03/30/2022

## 2022-04-04 ENCOUNTER — Other Ambulatory Visit: Payer: Self-pay

## 2022-04-04 DIAGNOSIS — I350 Nonrheumatic aortic (valve) stenosis: Secondary | ICD-10-CM

## 2022-04-07 ENCOUNTER — Encounter: Payer: Self-pay | Admitting: Internal Medicine

## 2022-04-07 NOTE — Progress Notes (Signed)
PERIOPERATIVE PRESCRIPTION FOR IMPLANTED CARDIAC DEVICE PROGRAMMING  Patient Information: Name:  Gregory Mccormick  DOB:  [birth year]  MRN:  [id number]    Planned Procedure:  TAVR  Surgeon:  Excell Seltzer & Laneta Simmers  Date of Procedure:  04/12/22  Cautery will be used.  Position during surgery:  Supine   Please send documentation back to:  [facility] (Fax # [phone])  Device Information:  Clinic EP Physician:  Hillis Range, MD   Device Type:  Pacemaker Manufacturer and Phone #:  [employer]: [id number] Pacemaker Dependent?:  Unknown Date of Last Device Check:  07/12/2021 Normal Device Function?:  Yes.    Electrophysiologist's Recommendations:  Have magnet available. Provide continuous ECG monitoring when magnet is used or reprogramming is to be performed.  Procedure will likely interfere with device function.  Device should be programmed:  Asynchronous pacing during procedure and returned to normal programming after procedure Device will need to be checked by industry prior to procedure.  Per Device Clinic Standing Orders, Lenor Coffin, RN  8:56 AM 04/07/2022

## 2022-04-07 NOTE — Progress Notes (Signed)
Surgical Instructions    Your procedure is scheduled on Tuesday June 13th.  Report to [facility] Main Entrance "A" at 8:45 A.M., then check in with the Admitting office.  Call this number if you have problems the morning of surgery:  [phone]   If you have any questions prior to your surgery date call [phone]: Open Monday-Friday 8am-4pm    Remember:  Do not eat or drink after midnight the night before your surgery     Take these medicines the morning of surgery with A SIP OF WATER:  NONE  Per instruction from your surgeon's office stop your Eliquis and Aspirin on 04/07/22 with your last dose being 04/06/22  As of today, STOP taking any Aleve, Naproxen, Ibuprofen, Motrin, Advil, Goody's, BC's, all herbal medications, fish oil, and all vitamins.           Do not wear jewelry  Do not wear lotions, powders, colognes, or deodorant. Do not shave 48 hours prior to surgery.  Men may shave face and neck. Do not bring valuables to the hospital.   [facility] is not responsible for any belongings or valuables. .   Do NOT Smoke (Tobacco/Vaping)  24 hours prior to your procedure  If you use a CPAP at night, you may bring your mask for your overnight stay.   Contacts, glasses, hearing aids, dentures or partials may not be worn into surgery, please bring cases for these belongings   For patients admitted to the hospital, discharge time will be determined by your treatment team.   Patients discharged the day of surgery will not be allowed to drive home, and someone needs to stay with them for 24 hours.   SURGICAL WAITING ROOM VISITATION Patients having surgery or a procedure in a hospital may have two support people. Children under the age of 73 must have an adult with them who is not the patient. They may stay in the waiting area during the procedure and may switch out with other visitors. If the patient needs to stay at the hospital during part of their recovery, the visitor  guidelines for inpatient rooms apply.  Please refer to the [facility] website for the visitor guidelines for Inpatients (after your surgery is over and you are in a regular room).       Special instructions:    Oral Hygiene is also important to reduce your risk of infection.  Remember - BRUSH YOUR TEETH THE MORNING OF SURGERY WITH YOUR REGULAR TOOTHPASTE   [place]- Preparing For Surgery  Before surgery, you can play an important role. Because skin is not sterile, your skin needs to be as free of germs as possible. You can reduce the number of germs on your skin by washing with CHG (chlorahexidine gluconate) Soap before surgery.  CHG is an antiseptic cleaner which kills germs and bonds with the skin to continue killing germs even after washing.     Please do not use if you have an allergy to CHG or antibacterial soaps. If your skin becomes reddened/irritated stop using the CHG.  Do not shave (including legs and underarms) for at least 48 hours prior to first CHG shower. It is OK to shave your face.  Please follow these instructions carefully.     Shower the NIGHT BEFORE SURGERY and the MORNING OF SURGERY with CHG Soap.   If you chose to wash your hair, wash your hair first as usual with your normal shampoo. After you shampoo, rinse your hair and  body thoroughly to remove the shampoo.  Then [employer] and genitals (private parts) with your normal soap and rinse thoroughly to remove soap.  After that Use CHG Soap as you would any other liquid soap. You can apply CHG directly to the skin and wash gently with a scrungie or a clean washcloth.   Apply the CHG Soap to your body ONLY FROM THE NECK DOWN.  Do not use on open wounds or open sores. Avoid contact with your eyes, ears, mouth and genitals (private parts). Wash Face and genitals (private parts)  with your normal soap.   Wash thoroughly, paying special attention to the area where your surgery will be performed.  Thoroughly rinse  your body with warm water from the neck down.  DO NOT shower/wash with your normal soap after using and rinsing off the CHG Soap.  Pat yourself dry with a CLEAN TOWEL.  Wear CLEAN PAJAMAS to bed the night before surgery  Place CLEAN SHEETS on your bed the night before your surgery  DO NOT SLEEP WITH PETS.   Day of Surgery:  Take a shower with CHG soap. Wear Clean/Comfortable clothing the morning of surgery Do not apply any deodorants/lotions.   Remember to brush your teeth WITH YOUR REGULAR TOOTHPASTE.    If you received a COVID test during your pre-op visit, it is requested that you wear a mask when out in public, stay away from anyone that may not be feeling well, and notify your surgeon if you develop symptoms. If you have been in contact with anyone that has tested positive in the last 10 days, please notify your surgeon.    Please read over the following fact sheets that you were given.

## 2022-04-08 ENCOUNTER — Ambulatory Visit (HOSPITAL_COMMUNITY)
Admission: RE | Admit: 2022-04-08 | Discharge: 2022-04-08 | Disposition: A | Discharge status: Home / Self Care | Payer: Medicare Other | Source: Ambulatory Visit | Attending: Cardiovascular Disease | Admitting: Cardiovascular Disease

## 2022-04-08 ENCOUNTER — Other Ambulatory Visit: Payer: Self-pay

## 2022-04-08 ENCOUNTER — Encounter (HOSPITAL_COMMUNITY)
Admission: RE | Admit: 2022-04-08 | Discharge: 2022-04-08 | Disposition: A | Discharge status: Home / Self Care | Payer: Medicare Other | Source: Ambulatory Visit | Attending: Cardiovascular Disease | Admitting: Cardiovascular Disease

## 2022-04-08 ENCOUNTER — Encounter (HOSPITAL_COMMUNITY): Payer: Self-pay

## 2022-04-08 VITALS — BP 114/79 | HR 85 | Temp 97.8°F | Resp 18 | Ht 68.0 in | Wt 193.3 lb

## 2022-04-08 DIAGNOSIS — Z01818 Encounter for other preprocedural examination: Secondary | ICD-10-CM | POA: Diagnosis present

## 2022-04-08 DIAGNOSIS — K449 Diaphragmatic hernia without obstruction or gangrene: Secondary | ICD-10-CM | POA: Diagnosis not present

## 2022-04-08 DIAGNOSIS — I350 Nonrheumatic aortic (valve) stenosis: Secondary | ICD-10-CM | POA: Insufficient documentation

## 2022-04-08 DIAGNOSIS — Z20822 Contact with and (suspected) exposure to covid-19: Secondary | ICD-10-CM | POA: Insufficient documentation

## 2022-04-08 LAB — TYPE AND SCREEN
ABO/RH(D): O POS
Antibody Screen: NEGATIVE

## 2022-04-08 LAB — COMPREHENSIVE METABOLIC PANEL
ALT: 12 U/L (ref 0–44)
AST: 17 U/L (ref 15–41)
Albumin: 3.9 g/dL (ref 3.5–5.0)
Alkaline Phosphatase: 73 U/L (ref 38–126)
Anion gap: 8 (ref 5–15)
BUN: 21 mg/dL (ref 8–23)
CO2: 28 mmol/L (ref 22–32)
Calcium: 9.1 mg/dL (ref 8.9–10.3)
Chloride: 102 mmol/L (ref 98–111)
Creatinine, Ser: 1.7 mg/dL — ABNORMAL HIGH (ref 0.61–1.24)
GFR, Estimated: 38 mL/min — ABNORMAL LOW (ref >60)
Glucose, Bld: 95 mg/dL (ref 70–99)
Potassium: 4.4 mmol/L (ref 3.5–5.1)
Sodium: 138 mmol/L (ref 135–145)
Total Bilirubin: 1.1 mg/dL (ref 0.3–1.2)
Total Protein: 6.9 g/dL (ref 6.5–8.1)

## 2022-04-08 LAB — CBC
HCT: 41.8 % (ref 39.0–52.0)
Hemoglobin: 13.4 g/dL (ref 13.0–17.0)
MCH: 32.4 pg (ref 26.0–34.0)
MCHC: 32.1 g/dL (ref 30.0–36.0)
MCV: 101.2 fL — ABNORMAL HIGH (ref 80.0–100.0)
Platelets: 188 10*3/uL (ref 150–400)
RBC: 4.13 MIL/uL — ABNORMAL LOW (ref 4.22–5.81)
RDW: 13.9 % (ref 11.5–15.5)
WBC: 7.1 10*3/uL (ref 4.0–10.5)
nRBC: 0 % (ref 0.0–0.2)

## 2022-04-08 LAB — URINALYSIS, ROUTINE W REFLEX MICROSCOPIC
Bilirubin Urine: NEGATIVE
Glucose, UA: NEGATIVE mg/dL
Hgb urine dipstick: NEGATIVE
Ketones, ur: NEGATIVE mg/dL
Leukocytes,Ua: NEGATIVE
Nitrite: NEGATIVE
Protein, ur: NEGATIVE mg/dL
Specific Gravity, Urine: 1.011 (ref 1.005–1.030)
pH: 7 (ref 5.0–8.0)

## 2022-04-08 LAB — PROTIME-INR
INR: 1.1 (ref 0.8–1.2)
Prothrombin Time: 14.1 seconds (ref 11.4–15.2)

## 2022-04-08 LAB — SURGICAL PCR SCREEN
MRSA, PCR: NEGATIVE
Staphylococcus aureus: NEGATIVE

## 2022-04-08 LAB — SARS CORONAVIRUS 2 (TAT 6-24 HRS): SARS Coronavirus 2: NEGATIVE

## 2022-04-08 NOTE — Progress Notes (Signed)
/  PCP - Antony Contras, MD Cardiologist - Dr. Irish Lack  PPM/ICD - Pacer Device Orders - Received and in chart Rep Notified - [place] emailed on 04/07/22  Chest x-ray - 04/08/22 EKG - 04/08/22 Stress Test - "It's been some time ago"  ECHO - 01/24/22 Cardiac Cath - 01/27/22  Sleep Study - Yes has OSA CPAP - Not using any longer  DM - Denies Fasting Blood Sugar -  Checks Blood Sugar _____ times a day  Blood Thinner Instructions:Per surgeon instructed to stop Tuesday Aspirin Instructions:Per surgeon instructed to stop Tuesday  COVID TEST- 04/08/22   Anesthesia review: Yes cardiac history  Patient denies shortness of breath, fever, cough and chest pain at PAT appointment   All instructions explained to the patient, with a verbal understanding of the material. Patient agrees to go over the instructions while at home for a better understanding. Patient also instructed to wear a mask while in public after being tested for COVID-19. The opportunity to ask questions was provided.

## 2022-04-11 ENCOUNTER — Telehealth: Payer: Self-pay

## 2022-04-11 MED ORDER — HEPARIN 30,000 UNITS/1000 ML (OHS) CELLSAVER SOLUTION
Status: DC
  Filled 2022-04-11: qty 1000

## 2022-04-11 MED ORDER — CEFAZOLIN SODIUM-DEXTROSE 2-4 GM/100ML-% IV SOLN
2.0000 g | INTRAVENOUS | Status: AC
  Administered 2022-04-12: 2 g via INTRAVENOUS
  Filled 2022-04-11: qty 100

## 2022-04-11 MED ORDER — DEXMEDETOMIDINE HCL IN NACL 400 MCG/100ML IV SOLN
0.1000 ug/kg/h | INTRAVENOUS | Status: AC
  Administered 2022-04-12: 87.72 ug via INTRAVENOUS
  Administered 2022-04-12: 1 ug/kg/h via INTRAVENOUS
  Filled 2022-04-11: qty 100

## 2022-04-11 MED ORDER — POTASSIUM CHLORIDE 2 MEQ/ML IV SOLN
80.0000 meq | INTRAVENOUS | Status: DC
  Filled 2022-04-11: qty 40

## 2022-04-11 MED ORDER — MAGNESIUM SULFATE 50 % IJ SOLN
40.0000 meq | INTRAMUSCULAR | Status: DC
  Filled 2022-04-11: qty 9.85

## 2022-04-11 MED ORDER — NOREPINEPHRINE 4 MG/250ML-% IV SOLN
0.0000 ug/min | INTRAVENOUS | Status: AC
  Administered 2022-04-12: 2 ug/min via INTRAVENOUS
  Filled 2022-04-11: qty 250

## 2022-04-11 NOTE — H&P (Signed)
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  He continues to mow his yard and rides on a tractor and does a lot of work around his property.  He has developed increasing shortness of breath over the past several months as well as fatigue and tiredness.  He has to stop multiple times while walking his dog.  He has had some dizziness but no syncope particularly when standing up.  He denies any chest pain or pressure.  He has had no peripheral edema.        Past Medical History:  Diagnosis Date   Anemia, unspecified     Aortic stenosis     Arthritis      "right knee" (03/28/2013   CAD in native artery      takes Xarelto daily   Cardiomyopathy (HCC)      EF 40%:  ECHO 11/25/2015 EF 55%-60%   Carpal tunnel syndrome, bilateral     Chronic systolic CHF (congestive heart failure) (HCC)     CKD (chronic kidney disease), stage II     Epistaxis     Essential hypertension, benign      takes Metoprolol daily   H/O hiatal hernia     History of kidney stones     Hyperlipidemia      takes Lipitor daily   Nonspecific abnormal unspecified cardiovascular function study     OSA on CPAP      wears CPAP 02/03/16   Pacemaker      MDT   PAF (paroxysmal atrial  fibrillation) (HCC)     Paroxysmal ventricular tachycardia (HCC)     PONV (postoperative nausea and vomiting)     PVC's (premature ventricular contractions)     Vitamin D deficiency             Past Surgical History:  Procedure Laterality Date   CARDIAC CATHETERIZATION   03/21/2013   CARDIOVERSION N/A 10/09/2018    Procedure: CARDIOVERSION;  Surgeon: Gregory Stade, MD;  Location: [facility];  Service: Cardiovascular;  Laterality: N/A;   CARPAL TUNNEL RELEASE Right 02/05/2016    Procedure: RIGHT LIMITED OPEN CARPAL TUNNEL RELEASE;  Surgeon: Gregory Severin, MD;  Location: [facility] OR;  Service: Orthopedics;  Laterality: Right;   CARPAL TUNNEL RELEASE Left 04/21/2016    Procedure: CARPAL TUNNEL RELEASE;  Surgeon: Gregory Severin, MD;  Location: [facility] OR;  Service: Orthopedics;  Laterality: Left;   CATARACT EXTRACTION W/ INTRAOCULAR LENS  IMPLANT, BILATERAL Bilateral 2000's   COLONOSCOPY       CORONARY ANGIOPLASTY WITH STENT PLACEMENT   03/28/2013    "3 stents" (03/28/2013)   HIP ARTHROPLASTY Right 04/30/2019    Procedure: ARTHROPLASTY BIPOLAR HIP (HEMIARTHROPLASTY);  Surgeon: Gregory Romans, MD;  Location:  [facility];  Service: Orthopedics;  Laterality: Right;   INSERT / REPLACE / REMOVE PACEMAKER       KNEE CARTILAGE SURGERY Left 1972    "cut it open" (03/28/2013)   KNEE CARTILAGE SURGERY Right ~ 1977    "cut it open" (03/28/2013)   PACEMAKER INSERTION   10/05/10    MDT Adapta L implanted by Dr Gregory Mccormick for mobitz II second degree AV block   PERCUTANEOUS CORONARY ROTOBLATOR INTERVENTION (PCI-R) N/A 03/28/2013    Procedure: PERCUTANEOUS CORONARY ROTOBLATOR INTERVENTION (PCI-R);  Surgeon: Gregory Crafts, MD;  Location: [facility] CATH LAB;  Service: Cardiovascular;  Laterality: N/A;   RIGHT/LEFT HEART CATH AND CORONARY ANGIOGRAPHY N/A 01/27/2022    Procedure: RIGHT/LEFT HEART CATH AND CORONARY ANGIOGRAPHY;  Surgeon: Gregory Crafts, MD;  Location: [facility] INVASIVE CV LAB;  Service: Cardiovascular;  Laterality:  N/A;   SEPTOPLASTY N/A 02/05/2016    Procedure: SEPTOPLASTY WITH INTRANASAL SKIN GRAFT;  Surgeon: Gregory Halon, MD;  Location: [facility] OR;  Service: ENT;  Laterality: N/A;   SHOULDER HEMI-ARTHROPLASTY Right 1987    "got hit by sled & dragged down sheet > 200 feet; tore shoulder up" (03/28/2013)   SHOULDER SURGERY Right 1970's    "pulled muscle apart; sewed it back together" (03/28/2013   TOTAL KNEE ARTHROPLASTY Right 2007   TOTAL KNEE ARTHROPLASTY   11/26/2012    Procedure: TOTAL KNEE ARTHROPLASTY;  Surgeon: Gregory Drilling, MD;  Location: [facility];  Service: Orthopedics;  Laterality: Left;   TRANSURETHRAL RESECTION OF PROSTATE N/A 12/27/2017    Procedure: TRANSURETHRAL RESECTION OF THE PROSTATE (TURP);  Surgeon: Gregory Elliot, MD;  Location: [facility];  Service: Urology;  Laterality: N/A;           Family History  Problem Relation Age of Onset   CAD Brother 33   Stroke Brother     Cancer Other     Heart disease Other     Heart attack Neg Hx     Hypertension Neg Hx        Social History         Socioeconomic History   Marital status: Married      Spouse name: Not on file   Number of children: Not on file   Years of education: Not on file   Highest education level: Not on file  Occupational History   Occupation: retired      Associate Professor: [employer]  Tobacco Use   Smoking status: Former      Packs/day: 1.00      Years: 26.00      Pack years: 26.00      Types: Cigarettes      Quit date: 10/31/1973      Years since quitting: 48.4   Smokeless tobacco: Never  Vaping Use   Vaping Use: Never used  Substance and Sexual Activity   Alcohol use: No   Drug use: No   Sexual activity: Never  Other Topics Concern   Not on file  Social History Narrative   Not on file    Social Determinants of Health    Financial Resource Strain: Not on file  Food Insecurity: Not on file  Transportation Needs: Not on file  Physical Activity: Not on file  Stress: Not on  file  Social Connections: Not on file  Intimate Partner Violence: Not on file             Prior to Admission medications   Medication Sig  Start Date End Date Taking? Authorizing Provider  amiodarone (PACERONE) 200 MG tablet TAKE 1 TABLET BY MOUTH DAILY 02/15/22   Yes Gregory Crafts, MD  apixaban (ELIQUIS) 2.5 MG TABS tablet TAKE 1 TABLET BY MOUTH  TWICE DAILY 12/06/21   Yes Allred, Fayrene Fearing, MD  atorvastatin (LIPITOR) 10 MG tablet TAKE 1 TABLET BY MOUTH  DAILY AT 6 PM. 12/06/21   Yes Gregory Crafts, MD  ENTRESTO 24-26 MG TAKE 1 TABLET BY MOUTH  TWICE DAILY 02/28/22   Yes [name], MD  furosemide (LASIX) 40 MG tablet Take 1 tablet (40 mg total) by mouth daily. 01/13/22   Yes Gregory Crafts, MD  latanoprost (XALATAN) 0.005 % ophthalmic solution Place 1 drop into both eyes at bedtime. 11/28/20   Yes [provider]  Multiple Vitamins-Minerals (PRESERVISION AREDS 2+MULTI VIT PO) Take 1 capsule by mouth in the morning and at bedtime.     Yes [provider]  nitroGLYCERIN (NITROSTAT) 0.4 MG SL tablet Place 0.4 mg under the tongue every 5 (five) minutes as needed for chest pain.     Yes [provider]  Omega-3 Fatty Acids (FISH OIL) 1000 MG CAPS Take 1,000 mg by mouth daily.     Yes [provider]  timolol (TIMOPTIC) 0.5 % ophthalmic solution Place 1 drop into the right eye 2 (two) times daily. 01/19/22   Yes [provider]            Current Outpatient Medications  Medication Sig Dispense Refill   amiodarone (PACERONE) 200 MG tablet TAKE 1 TABLET BY MOUTH DAILY 90 tablet 3   apixaban (ELIQUIS) 2.5 MG TABS tablet TAKE 1 TABLET BY MOUTH  TWICE DAILY 180 tablet 1   atorvastatin (LIPITOR) 10 MG tablet TAKE 1 TABLET BY MOUTH  DAILY AT 6 PM. 90 tablet 3   ENTRESTO 24-26 MG TAKE 1 TABLET BY MOUTH  TWICE DAILY 180 tablet 3   furosemide (LASIX) 40 MG tablet Take 1 tablet (40 mg total) by mouth daily. 90 tablet 3   latanoprost (XALATAN) 0.005  % ophthalmic solution Place 1 drop into both eyes at bedtime.       Multiple Vitamins-Minerals (PRESERVISION AREDS 2+MULTI VIT PO) Take 1 capsule by mouth in the morning and at bedtime.       nitroGLYCERIN (NITROSTAT) 0.4 MG SL tablet Place 0.4 mg under the tongue every 5 (five) minutes as needed for chest pain.       Omega-3 Fatty Acids (FISH OIL) 1000 MG CAPS Take 1,000 mg by mouth daily.       timolol (TIMOPTIC) 0.5 % ophthalmic solution Place 1 drop into the right eye 2 (two) times daily.                 Current Facility-Administered Medications  Medication Dose Route Frequency Provider Last Rate Last Admin   betamethasone acetate-betamethasone sodium phosphate (CELESTONE) injection 3 mg  3 mg Intramuscular Once Felecia Shelling, DPM               Allergies  Allergen Reactions   Penicillins Hives      Has tolerated Keflex      Has patient had a PCN reaction causing immediate rash, facial/tongue/throat swelling, SOB or lightheadedness with hypotension: YES Has patient had a PCN reaction causing severe rash involving mucus membranes or skin necrosis: No Has patient had a PCN reaction that required hospitalization No Has patient had a PCN reaction occurring within the last 10  years: No If all of the above answers are "NO", then may proceed with Cephalosporin use.     Hydromorphone Hcl Nausea And Vomiting          Review of Systems:               General:                      normal appetite, + decreased energy, no weight gain, no weight loss, no fever             Cardiac:                       no chest pain with exertion, no chest pain at rest, + SOB with mild exertion, no resting SOB, no PND, no orthopnea, no palpitations, + arrhythmia, + atrial fibrillation, no LE edema, + dizzy spells, no syncope             Respiratory:                 + exertional shortness of breath, no home oxygen, no productive cough, no dry cough, no bronchitis, no wheezing, no hemoptysis, no asthma, no pain  with inspiration or cough, + sleep apnea, + CPAP at night             GI:                               no difficulty swallowing, no reflux, no frequent heartburn, no hiatal hernia, no abdominal pain, no constipation, no diarrhea, no hematochezia, no hematemesis, no melena             GU:                              no dysuria,  no frequency, no urinary tract infection, no hematuria, no enlarged prostate, no kidney stones, + chronic kidney disease             Vascular:                     no pain suggestive of claudication, no pain in feet, no leg cramps, no varicose veins, no DVT, no non-healing foot ulcer             Neuro:                         no stroke, no TIA's, no seizures, no headaches, no temporary blindness one eye,  no slurred speech, no peripheral neuropathy, no chronic pain, no instability of gait, no memory/cognitive dysfunction             Musculoskeletal:         + arthritis, no joint swelling, no myalgias, no difficulty walking, normal mobility              Skin:                            no rash, no itching, no skin infections, no pressure sores or ulcerations             Psych:                         no anxiety, no  depression, no nervousness, no unusual recent stress             Eyes:                           no blurry vision, no floaters, no recent vision changes, + wears glasses             ENT:                            no hearing loss, no loose or painful teeth, no dentures, last saw dentist every 6 months             Hematologic:               no easy bruising, no abnormal bleeding, no clotting disorder, no frequent epistaxis             Endocrine:                   no diabetes, does not check CBG's at home                            Physical Exam:               BP 122/80 (BP Location: Right Arm, Patient Position: Sitting)   Pulse 85   Resp 20   Ht 5\' 8"  (1.727 m)   Wt 194 lb (88 kg)   SpO2 91% Comment: RA  BMI 29.50 kg/m              General:                       Elderly but  well-appearing and looks younger than age             HEENT:                       Unremarkable, NCAT, PERLA, EOMI             Neck:                           no JVD, no bruits, no adenopathy              Chest:                          clear to auscultation, symmetrical breath sounds, no wheezes, no rhonchi              CV:                              RRR, 3/6 systolic murmur RSB, no diastolic murmur             Abdomen:                    soft, non-tender, no masses              Extremities:                 warm, well-perfused, pulses palpable at ankle, no lower extremity edema             Rectal/GU  Deferred             Neuro:                         Grossly non-focal and symmetrical throughout             Skin:                            Clean and dry, no rashes, no breakdown   Diagnostic Tests:   ECHOCARDIOGRAM REPORT         Patient Name:   Gregory Mccormick Date of Exam: 01/24/2022  Medical Rec #:  [id number]      Height:       68.0 in  Accession #:    [id number]     Weight:       192.0 lb  Date of Birth:  24-May-1929      BSA:          2.009 m  Patient Age:    89 years       BP:           108/68 mmHg  Patient Gender: M              HR:           84 bpm.  Exam Location:  [address]   Procedure: 2D Echo, 3D Echo, Cardiac Doppler, Color Doppler and Strain  Analysis   Indications:    I35 Aortic stenosis     History:        Patient has prior history of Echocardiogram examinations,  most                  recent 02/24/2021. Cardiomyopathy and CHF, CAD, Pacemaker,  Aortic                  Valve Disease, Arrythmias:2nd degree AV block. VT and  Atrial                  Fibrillation, Signs/Symptoms:Syncope; Risk  Factors:Hypertension,                  Sleep Apnea, Dyslipidemia and Former Smoker.     Sonographer:    Jorje Guild BS, RDCS  Referring Phys: [id number] JAYADEEP S VARANASI   IMPRESSIONS     1. The aortic valve is tricuspid. There is severe  calcifcation of the  aortic valve. There is severe thickening of the aortic valve. Aortic valve  regurgitation is trivial. There is severe aortic valve stenosis with AVA  0.9cm2, mean gradient , Vmax  3.3m/s, DI 0.21. Trivial aortic regurgitation.   2. The mitral valve is degenerative. There is prolapse and with a  possible flail segment of the posterior mitral valve leaflet. There is  likely severe, anteriorly directed mitral regurgitation with significant  splay artifact. Moderate mitral annular  calcification.   3. Left ventricular ejection fraction, by estimation, is 55 to 60%. The  left ventricle has normal function. The left ventricle has no regional  wall motion abnormalities. There is mild concentric left ventricular  hypertrophy. Left ventricular diastolic  parameters are indeterminate. Elevated left atrial pressure. The average  left ventricular global longitudinal strain is -19.1 %. The global  longitudinal strain is normal.   4. Right ventricular systolic function is mildly reduced. The right  ventricular size is normal. There is mildly elevated pulmonary artery  systolic pressure. The  estimated right ventricular systolic pressure is  40.2 mmHg.   5. Left atrial size was mild to moderately dilated.   6. Tricuspid valve regurgitation is mild to moderate.   7. Aortic dilatation noted. There is mild dilatation of the ascending  aorta, measuring 39 mm.   8. The inferior vena cava is normal in size with greater than 50%  respiratory variability, suggesting right atrial pressure of 3 mmHg.   Comparison(s): Prior TTE on 02/24/21 with EF 60-65%. Severe AS mean  PG, peak PG. Moderate MR. Compared to prior TTE, LVEF appears  slightly less vigorous (55-60%), MR now appears severe, AS continues to  appear severe.   FINDINGS   Left Ventricle: Left ventricular ejection fraction, by estimation, is 55  to 60%. The left ventricle has normal function. The left  ventricle has no  regional wall motion abnormalities. The average left ventricular global  longitudinal strain is -19.1 %.  The global longitudinal strain is normal. 3D left ventricular ejection  fraction analysis performed but not reported based on interpreter  judgement due to suboptimal tracking. The left ventricular internal cavity  size was normal in size. There is mild  concentric left ventricular hypertrophy. Left ventricular diastolic  parameters are indeterminate. Elevated left atrial pressure.   Right Ventricle: The right ventricular size is normal. No increase in  right ventricular wall thickness. Right ventricular systolic function is  mildly reduced. There is mildly elevated pulmonary artery systolic  pressure. The tricuspid regurgitant velocity   is 3.05 m/s, and with an assumed right atrial pressure of 3 mmHg, the  estimated right ventricular systolic pressure is 40.2 mmHg.   Left Atrium: Left atrial size was mild to moderately dilated.   Right Atrium: Right atrial size was normal in size.   Pericardium: There is no evidence of pericardial effusion.   Mitral Valve: EROA 0.4cm2, RVol 63mL. The mitral valve is degenerative in  appearance. There is mild thickening of the mitral valve leaflet(s). There  is mild calcification of the mitral valve leaflet(s). Moderate mitral  annular calcification. There is  prolapse and with a possible flail segment of the posterior mitral valve  leaflet. There is likely severe, anteriorly directed mitral regurgitation  with significant splay artifact. mitral valve regurgitation.   Tricuspid Valve: The tricuspid valve is normal in structure. Tricuspid  valve regurgitation is mild to moderate.   Aortic Valve: The aortic valve is tricuspid. There is severe calcifcation  of the aortic valve. There is severe thickening of the aortic valve.  Aortic valve regurgitation is trivial. Severe aortic stenosis is present.  Aortic valve mean gradient  measures  31.0 mmHg. Aortic valve peak gradient measures 58.2 mmHg. Aortic valve  area, by VTI measures 1.11 cm.   Pulmonic Valve: The pulmonic valve was normal in structure. Pulmonic valve  regurgitation is trivial.   Aorta: The aortic root is normal in size and structure and aortic  dilatation noted. There is mild dilatation of the ascending aorta,  measuring 39 mm.   Venous: The inferior vena cava is normal in size with greater than 50%  respiratory variability, suggesting right atrial pressure of 3 mmHg.   IAS/Shunts: The atrial septum is grossly normal.   Additional Comments: A device lead is visualized.      LEFT VENTRICLE  PLAX 2D  LVIDd:         4.90 cm   Diastology  LVIDs:         3.70 cm   LV e'  medial:    8.05 cm/s  LV PW:         1.10 cm   LV E/e' medial:  16.8  LV IVS:        1.10 cm   LV e' lateral:   9.36 cm/s  LVOT diam:     2.80 cm   LV E/e' lateral: 14.4  LV SV:         91  LV SV Index:   45        2D Longitudinal Strain  LVOT Area:     6.16 cm  2D Strain GLS Avg:     -19.1 %                              3D Volume EF:                           3D EF:        41 %                           LV EDV:       116 ml                           LV ESV:       68 ml                           LV SV:        48 ml   RIGHT VENTRICLE            IVC  RV Basal diam:  3.70 cm    IVC diam: 1.60 cm  RV S prime:     9.68 cm/s  TAPSE (M-mode): 1.9 cm  RVSP:           40.2 mmHg   LEFT ATRIUM             Index        RIGHT ATRIUM           Index  LA diam:        4.10 cm 2.04 cm/m   RA Pressure: 3.00 mmHg  LA Vol (A2C):   46.1 ml 22.95 ml/m  RA Area:     17.60 cm  LA Vol (A4C):   76.0 ml 37.84 ml/m  RA Volume:   51.20 ml  25.49 ml/m  LA Biplane Vol: 59.7 ml 29.72 ml/m   AORTIC VALVE  AV Area (Vmax):    1.12 cm  AV Area (Vmean):   1.13 cm  AV Area (VTI):     1.11 cm  AV Vmax:           381.50 cm/s  AV Vmean:          250.200 cm/s  AV VTI:            0.815 m  AV  Peak Grad:      58.2 mmHg  AV Mean Grad:      31.0 mmHg  LVOT Vmax:         69.20 cm/s  LVOT Vmean:        46.000 cm/s  LVOT VTI:          0.147 m  LVOT/AV VTI ratio: 0.18     AORTA  Ao Root diam: 3.40 cm  Ao Asc diam:  3.90 cm   MITRAL VALVE                  TRICUSPID VALVE                                TR Peak grad:   37.2 mmHg  MV Decel Time: 176 msec       TR Vmax:        305.00 cm/s  MR Peak grad:    94.5 mmHg    Estimated RAP:  3.00 mmHg  MR Mean grad:    54.5 mmHg    RVSP:           40.2 mmHg  MR Vmax:         486.00 cm/s  MR Vmean:        339.5 cm/s   SHUNTS  MR PISA:         3.53 cm     Systemic VTI:  0.15 m  MR PISA Eff ROA: 29 mm       Systemic Diam: 2.80 cm  MR PISA Radius:  0.75 cm  MV E velocity: 135.00 cm/s  MV A velocity: 90.70 cm/s  MV E/A ratio:  1.49   Laurance Flatten MD  Electronically signed by Laurance Flatten MD  Signature Date/Time: 01/24/2022/12:37:04 PM         Final       Physicians   Panel Physicians Referring Physician Case Authorizing Physician  Gregory Crafts, MD (Primary)        Procedures   RIGHT/LEFT HEART CATH AND CORONARY ANGIOGRAPHY    Conclusion       Ost Cx to Prox Cx lesion is 60% stenosed.   2nd Mrg lesion is 80% stenosed.   Prox RCA to Mid RCA lesion is 25% stenosed.   Previously placed Prox LAD to Mid LAD stent (unknown type) is  widely patent.   LV end diastolic pressure is normal.   Hemodynamic findings consistent with mild pulmonary hypertension.   There is moderate aortic valve stenosis.   Aortic saturation 94%, PA saturation 66%, PA pressure 43/11, mean PA pressure 26 mmHg, mean pulmonary capillary wedge pressure 12 mmHg, prominent V waves noted on the wedge tracing; cardiac output 4.87 L/min, cardiac index 2.4.   CAD noted in the circumflex system.  Given the patient's lack of angina, intervention was deferred.  Continue with plans for TAVR work-up.   Contrast administration limited due to renal  insufficiency.   Results conveyed to the patient's son, August Saucer [phone].   Recommendations   Discharge Date In the absence of any other complications or medical issues, we expect the patient to be ready for discharge from a cath perspective on 01/27/2022.    Indications   Severe aortic stenosis [I35.0 (ICD-10-CM)]    Clinical Presentation   CHF/Shock Congestive heart failure not present. No shock present.    Procedural Details   Technical Details The risks, benefits, and details of the procedure were explained to the patient.  The patient verbalized understanding and wanted to proceed.  Informed written consent was obtained.  PROCEDURE TECHNIQUE:  After Xylocaine anesthesia, a 5 French sheath was placed in the right antecubital areain exchange for a peripheral IV. A 5 French balloontipped Swan-Ganz catheter was advanced to the pulmonary artery under fluoroscopic guidance. Hemodynamic pressures were obtained. Oxygen saturations were obtained. After Xylocaine  anesthesia, a 56F sheath was placed in the right radial artery with a single anterior needle wall stick using ultrasound guidance.     Left heart cath was done using a JR4 catheter. Right coronary angiography was done using a Judkins R4 guide catheter.  Left coronary angiography was done using a Judkins L4 guide catheter.     Contrast: 30 cc   Estimated blood loss <50 mL.   During this procedure medications were administered to achieve and maintain moderate conscious sedation while the patient's heart rate, blood pressure, and oxygen saturation were continuously monitored and I was present face-to-face 100% of this time.    Medications (Filter: Administrations occurring from 1247 to 1400 on 01/27/22) Heparin (Porcine) in NaCl 1000-0.9 UT/500ML-% SOLN (mL) Total volume:  1,000 mL Date/Time Rate/Dose/Volume Action    01/27/22 1252 500 mL Given    1252 500 mL Given      fentaNYL (SUBLIMAZE) injection (mcg) Total dose:  25  mcg Date/Time Rate/Dose/Volume Action    01/27/22 1305 25 mcg Given      midazolam (VERSED) injection (mg) Total dose:  1 mg Date/Time Rate/Dose/Volume Action    01/27/22 1305 1 mg Given      lidocaine (PF) (XYLOCAINE) 1 % injection (mL) Total volume:  3 mL Date/Time Rate/Dose/Volume Action    01/27/22 1321 1 mL Given    1321 2 mL Given      Radial Cocktail/Verapamil only (mL) Total volume:  12 mL Date/Time Rate/Dose/Volume Action    01/27/22 1324 10 mL Given    1348 2 mL Given      iohexol (OMNIPAQUE) 350 MG/ML injection (mL) Total volume:  30 mL Date/Time Rate/Dose/Volume Action    01/27/22 1358 30 mL Given      heparin sodium (porcine) injection (Units) Total dose:  4,500 Units Date/Time Rate/Dose/Volume Action    01/27/22 1329 4,500 Units Given      Contrast   Medication Name Total Dose  iohexol (OMNIPAQUE) 350 MG/ML injection 30 mL    Radiation/Fluoro   Fluoro time: 3.7 (min) DAP: 13.4 (Gycm2) Cumulative Air Kerma: 279 (mGy) Complications      Complications documented before study signed (01/27/2022  2:07 PM)     RIGHT/LEFT HEART CATH AND CORONARY ANGIOGRAPHY   None Documented by Alveda Reasons, RT 01/27/2022  1:55 PM  Date Found: 01/27/2022  Time Range: Intraprocedure          Coronary Findings   Diagnostic Dominance: Right Left Anterior Descending  Previously placed Prox LAD to Mid LAD stent (unknown type) is widely patent.    Left Circumflex  Ost Cx to Prox Cx lesion is 60% stenosed. The lesion is eccentric.    Second Obtuse Marginal Branch  Vessel is large in size.  2nd Mrg lesion is 80% stenosed.    Right Coronary Artery  Prox RCA to Mid RCA lesion is 25% stenosed.    Intervention    No interventions have been documented.    Right Heart   Right Heart Pressures Hemodynamic findings consistent with mild pulmonary hypertension. Aortic saturation 94%, PA saturation 66%, PA pressure 43/11, mean PA pressure 26 mmHg, mean pulmonary capillary  wedge pressure 12 mmHg, prominent V waves noted on the wedge tracing; cardiac output 4.87 L/min, cardiac index 2.4.    Left Heart   Left Ventricle LV end diastolic pressure is normal.  Aortic Valve There is moderate aortic valve stenosis.    Coronary Diagrams   Diagnostic Dominance: Right Intervention   Implants  No implant documentation for this case.    Syngo Images    Show images for CARDIAC CATHETERIZATION Images on Long Term Storage    Show images for Juma, [place] to Procedure Log   Procedure Log    Hemo Data   Flowsheet Row Most Recent Value  Fick Cardiac Output 4.87 L/min  Fick Cardiac Output Index 2.43 (L/min)/BSA  Aortic Mean Gradient 20.38 mmHg  Aortic Peak Gradient 10.5 mmHg  Aortic Valve Area 1.14  Aortic Value Area Index 0.57 cm2/BSA  RA A Wave 9 mmHg  RA V Wave 5 mmHg  RA Mean 4 mmHg  RV Systolic Pressure 43 mmHg  RV Diastolic Pressure 1 mmHg  RV EDP 11 mmHg  PA Systolic Pressure 43 mmHg  PA Diastolic Pressure 11 mmHg  PA Mean 26 mmHg  PW A Wave 16 mmHg  PW V Wave 23 mmHg  PW Mean 12 mmHg  AO Systolic Pressure 129 mmHg  AO Diastolic Pressure 57 mmHg  AO Mean 86 mmHg  LV Systolic Pressure 163 mmHg  LV Diastolic Pressure 2 mmHg  LV EDP 13 mmHg  AOp Systolic Pressure 147 mmHg  AOp Diastolic Pressure 106 mmHg  AOp Mean Pressure 103 mmHg  LVp Systolic Pressure 169 mmHg  LVp Diastolic Pressure 2 mmHg  LVp EDP Pressure 20 mmHg  QP/QS 1  TPVR Index 10.73 HRUI  TSVR Index 35.46 HRUI  PVR SVR Ratio 0.17  TPVR/TSVR Ratio 0.3      ADDENDUM REPORT: 02/08/2022 14:18   CLINICAL DATA:  Aortic Valve pathology with assessment for TAVR   EXAM: Cardiac TAVR CT   TECHNIQUE: The patient was scanned on a Siemens Force 192 slice scanner. A 120 kV retrospective scan was triggered in the descending thoracic aorta at 111 HU's. Gantry rotation speed was 270 msecs and collimation was .9 mm. No beta blockade or nitro were given. The 3D data set  was reconstructed in 5% intervals of the R-R cycle. Systolic and diastolic phases were analyzed on a dedicated work station using MPR, MIP and VRT modes. The patient received 80 cc of contrast.   FINDINGS: Aortic Valve: Severely thickened aortic valve with heavy calcification and reduced excursion the planimeter valve area is 1.0 Sq cm consistent with severe aortic stenosis   Number of leaflets: 3   LVOT calcification: None   Annular calcification: None   Aortic Valve Calcium Score: 2479   Presence of basal septal hypertrophy: No   Perimembranous septal diameter: 11 mm   Aortic Annulus Measurements 20% Phase   Major annulus diameter: 29 mm   Minor annulus diameter: 26 mm   Annular perimeter: 84 mm   Annular area: 5.44 cm2   Aortic Root Measurements- 70% phase   Sinotubular Junction: 32 mm   Ascending Thoracic Aorta: 37 mm   Aortic Arch: 30 mm   Descending Thoracic Aorta: 29 mm   Aortic atherosclerosis.   Sinus of Valsalva Measurements:   Right coronary cusp width: 31 mm   Left coronary cusp width: 32 mm   Non coronary cusp width: 32 mm   Mean diameter: 32 mm   Coronary Artery Height above Annulus:   Left Main: 20 mm   Left SoV height: 16 mm   Right Coronary: 16 mm   Right SoV height: 20 mm   Optimum Fluoroscopic Angle for Delivery: LAO 3, CAU 1   Valves for structural team consideration:   Measurements at the Upper limit of a 26-mm Edwards Sapien   Sufficient  Height and SOV Diameter for 29 mm CoreValve   Non TAVR Valve Findings:   Coronary Arteries: Large prox-mid LAD stent noted. Calcium scoring deferred.   CIED:   Course: There are 2 CIED leads present . There is a single lead that terminates in the right atrium, and a single lead that terminates in the right ventricle.   Superior Vena Cava: There are 2 CIED that course through the SVC. The RV leads is located centrally within the lumen. The RA leads is embedded within the SVC  wall.   Right Atrium: There is a CIED lead that is embedded within the RA appendage. The CIED does not terminate beyond the RA wall.   Right Ventricle: There is a CIED lead the is embedded 2 cm lateral to the RV apex in the free wall. The CIED lead does not terminate beyond the RV wall.   Lead Fracture: No CIED lead fractures were noted.   Thrombus: No CIED lead thrombus was detected.   Dilation of the main pulmonary artery moderate, at 33 mm.   IMPRESSION: 1. Severe Aortic stenosis. Findings pertinent to TAVR procedure are detailed above.   Riley Lam MD     Electronically Signed   By: Riley Lam M.D.   On: 02/08/2022 14:18    Addended by Christell Constant, MD on 02/08/2022  2:21 PM    Study Result   Narrative & Impression  EXAM: OVER-READ INTERPRETATION  CT CHEST   The following report is an over-read performed by radiologist Dr. Allegra Lai of [facility] on 02/08/2022. This over-read does not include interpretation of cardiac or coronary anatomy or pathology. The coronary calcium score/coronary CTA interpretation by the cardiologist is attached.   COMPARISON:  None.   FINDINGS: Extracardiac findings will be described separately under dictation for contemporaneously obtained CTA chest, abdomen and pelvis.   IMPRESSION: Please see separate dictation for contemporaneously obtained CTA chest, abdomen and pelvis dated 02/08/2022 for full description of relevant extracardiac findings.   Electronically Signed: By: Allegra Lai M.D. On: 02/08/2022 11:44      Narrative & Impression  CLINICAL DATA:  Preop evaluation for aortic valve replacement.   EXAM: CT ANGIOGRAPHY CHEST, ABDOMEN AND PELVIS   TECHNIQUE: Multidetector CT imaging through the chest, abdomen and pelvis was performed using the standard protocol during bolus administration of intravenous contrast. Multiplanar reconstructed images and MIPs  were obtained and reviewed to evaluate the vascular anatomy.   RADIATION DOSE REDUCTION: This exam was performed according to the departmental dose-optimization program which includes automated exposure control, adjustment of the mA and/or kV according to patient size and/or use of iterative reconstruction technique.   CONTRAST:  80mL OMNIPAQUE IOHEXOL 350 MG/ML SOLN   COMPARISON:  CT abdomen and pelvis dated August 21, 2017   FINDINGS: CTA CHEST FINDINGS   Cardiovascular: Cardiomegaly. No pericardial effusion. Aortic valve thickening and calcifications. Left chest wall dual lead pacer with leads in the right atrium and right ventricle. Moderate atherosclerotic disease of the thoracic aorta. Left main and three-vessel coronary artery calcifications with stents seen in the LAD and RCA. Standard 3 vessel aortic arch, arch vessels are patent with no significant stenosis.   Mediastinum/Nodes: Large hiatal hernia containing stomach and transverse colon. Thyroid is unremarkable. No pathologically enlarged lymph nodes seen in the chest.   Lungs/Pleura: Central airways are patent. Bibasilar atelectasis. Mild centrilobular emphysema. No consolidation, pleural effusion pneumothorax.   Musculoskeletal: No chest wall abnormality. No acute or significant  osseous findings.   CTA ABDOMEN AND PELVIS FINDINGS   Hepatobiliary: No focal liver abnormality is seen. Probable small gallstones. No gallbladder wall thickening. No biliary ductal dilation.   Pancreas: Unremarkable. No pancreatic ductal dilatation or surrounding inflammatory changes.   Spleen: No splenic injury or perisplenic hematoma.   Adrenals/Urinary Tract: Bilateral adrenal glands are unremarkable. No hydronephrosis or nephrolithiasis. Bilateral indeterminate lesions of the lower poles, right lower pole lesion measures 7 mm is not and is located on of image 163, left lower pole lesion measures 1.0 cm and is located on  image 162. Additional bilateral low-attenuation renal lesions, largest are compatible with simple cysts, others are too small to completely characterize. Thick-walled and trabeculated urinary bladder.   Stomach/Bowel: Stomach is within normal limits. Appendix appears normal. No evidence of bowel wall thickening, distention, or inflammatory changes.   Vascular/lymphatic: Severe atherosclerotic disease of the abdominal aorta consisting calcified and noncalcified plaque. Severe narrowing at the origin of the celiac artery due to a combination of calcified plaque and diaphragmatic compression. Moderate narrowing at the origin of the SMA due to calcified and noncalcified plaque. Severe at the origin of the bilateral renal arteries due to calcified plaque. SMV is patent. No pathologically enlarged lymph nodes seen in the abdomen or pelvis.   Reproductive: Prostatomegaly with TURP defect.   Other: No abdominal wall hernia or abnormality. No abdominopelvic ascites.   Musculoskeletal: Prior total right hip arthroplasty. No acute osseous abnormality.   VASCULAR MEASUREMENTS PERTINENT TO TAVR:   AORTA:   Minimal Aortic Diameter-13.8 mm   Severity of Aortic Calcification-severe   RIGHT PELVIS:   Right Common Iliac Artery -   Minimal Diameter-9.3 mm   Tortuosity-none   Calcification-moderate   Right External Iliac Artery -   Minimal Diameter-8.0 mm   Tortuosity-moderate   Calcification-mild   Right Common Femoral Artery -   Minimal Diameter-6.5 mm   Tortuosity-none   Calcification-mild   LEFT PELVIS:   Left Common Iliac Artery -   Minimal Diameter-10.2 mm   Tortuosity-none   Calcification-moderate   Left External Iliac Artery -   Minimal Diameter-8.2 mm   Tortuosity-moderate   Calcification-mild   Left Common Femoral Artery -   Minimal Diameter-7.0 mm   Tortuosity-none   Calcification-mild   Review of the MIP images confirms the above  findings.   IMPRESSION: Vascular:   1. Vascular findings and measurements pertinent to potential TAVR procedure, as detailed above. 2. Severe thickening calcification of the aortic valve, compatible with reported clinical history of severe aortic stenosis. 3. Moderate to severe aortoiliac atherosclerosis. Left main and 3 vessel coronary artery disease. 4. Severe narrowing at the origin of the celiac artery and bilateral renal arteries.   Nonvascular:   1. Indeterminate bilateral lower pole renal lesions which are new when compared with 2018 prior exam, recommend further evaluation with ultrasound. 2. Thick-walled and trabeculated urinary bladder, likely due to chronic outlet obstruction.     Electronically Signed   By: Allegra Lai M.D.   On: 02/08/2022 14:41      Impression:   This 88 year old active gentleman has stage D, severe, symptomatic aortic stenosis with New York Heart Association class II symptoms of exertional fatigue and shortness of breath consistent with chronic diastolic congestive heart failure.  He has also had episodes of dizziness with standing.  I have personally reviewed his 2D echocardiogram, cardiac catheterization, and CTA studies.  His echocardiogram shows a trileaflet aortic valve with severe calcification and thickening and restricted mobility.  The mean gradient is 31 mmHg with a valve area of 1.1 cm by VTI.  Dimensionless index is 0.18.  Left ventricular ejection fraction is 55 to 60% with mild concentric LVH.  Cardiac catheterization shows patent previously placed stents in the proximal and mid LAD.  There is 60% proximal left circumflex and 80% second marginal stenosis.  He has had no anginal symptoms and this can be treated medically.  I agree that aortic valve replacement is indicated in this patient for relief of his lifestyle limiting symptoms and to prevent progressive left ventricular dysfunction and congestive heart failure.  Given his  advanced age I think transcatheter aortic valve replacement would be the best treatment for him.  His gated cardiac CTA shows anatomy suitable for TAVR using a SAPIEN 3 valve.  His abdominal and pelvic CTA shows adequate pelvic vascular anatomy to allow transfemoral insertion.   The patient and his son were counseled at length regarding treatment alternatives for management of severe symptomatic aortic stenosis. The risks and benefits of surgical intervention has been discussed in detail. Long-term prognosis with medical therapy was discussed. Alternative approaches such as conventional surgical aortic valve replacement, transcatheter aortic valve replacement, and palliative medical therapy were compared and contrasted at length. This discussion was placed in the context of the patient's own specific clinical presentation and past medical history. All of their questions have been addressed.    Following the decision to proceed with transcatheter aortic valve replacement, a discussion was held regarding what types of management strategies would be attempted intraoperatively in the event of life-threatening complications, including whether or not the patient would be considered a candidate for the use of cardiopulmonary bypass and/or conversion to open sternotomy for attempted surgical intervention.  Given his advanced age I would only consider emergent sternotomy to drain a pericardial effusion or repair a ventricular perforation.  The patient is aware of the fact that transient use of cardiopulmonary bypass may be necessary. The patient has been advised of a variety of complications that might develop including but not limited to risks of death, stroke, paravalvular leak, aortic dissection or other major vascular complications, aortic annulus rupture, device embolization, cardiac rupture or perforation, mitral regurgitation, acute myocardial infarction, arrhythmia, heart block or bradycardia requiring permanent  pacemaker placement, congestive heart failure, respiratory failure, renal failure, pneumonia, infection, other late complications related to structural valve deterioration or migration, or other complications that might ultimately cause a temporary or permanent loss of functional independence or other long term morbidity. The patient provides full informed consent for the procedure as described and all questions were answered.       Plan:     Transfemoral TAVR using a SAPIEN 3 valve.    [name], MD

## 2022-04-11 NOTE — Anesthesia Preprocedure Evaluation (Addendum)
Anesthesia Evaluation  Patient identified by MRN, date of birth, ID band Patient awake    Reviewed: Allergy & Precautions, NPO status , Patient's Chart, lab work & pertinent test results  History of Anesthesia Complications (+) PONV and history of anesthetic complications  Airway Mallampati: I  TM Distance: >3 FB Neck ROM: Full    Dental  (+) Edentulous Upper, Dental Advisory Given   Pulmonary sleep apnea and Continuous Positive Airway Pressure Ventilation , former smoker,    Pulmonary exam normal        Cardiovascular hypertension, Pt. on home beta blockers and Pt. on medications + CAD, + Cardiac Stents and +CHF  + dysrhythmias Atrial Fibrillation + pacemaker ([employer]) + Valvular Problems/Murmurs AS  Rhythm:Regular Rate:Normal + Systolic murmurs IMPRESSIONS    1. The aortic valve is tricuspid. There is severe calcifcation of the  aortic valve. There is severe thickening of the aortic valve. Aortic valve  regurgitation is trivial. There is severe aortic valve stenosis with AVA  0.9cm2, mean gradient 29mHg, Vmax  3.864m, DI 0.21. Trivial aortic regurgitation.  2. The mitral valve is degenerative. There is prolapse and with a  possible flail segment of the posterior mitral valve leaflet. There is  likely severe, anteriorly directed mitral regurgitation with significant  splay artifact. Moderate mitral annular  calcification.  3. Left ventricular ejection fraction, by estimation, is 55 to 60%. The  left ventricle has normal function. The left ventricle has no regional  wall motion abnormalities. There is mild concentric left ventricular  hypertrophy. Left ventricular diastolic  parameters are indeterminate. Elevated left atrial pressure. The average  left ventricular global longitudinal strain is -19.1 %. The global  longitudinal strain is normal.  4. Right ventricular systolic function is mildly reduced. The right   ventricular size is normal. There is mildly elevated pulmonary artery  systolic pressure. The estimated right ventricular systolic pressure is  4017.4mHg.  5. Left atrial size was mild to moderately dilated.  6. Tricuspid valve regurgitation is mild to moderate.  7. Aortic dilatation noted. There is mild dilatation of the ascending  aorta, measuring 39 mm.  8. The inferior vena cava is normal in size with greater than 50%  respiratory variability, suggesting right atrial pressure of 3 mmHg.   Comparison(s): Prior TTE on 02/24/21 with EF 60-65%. Severe AS 3649m mean  PG, 52m68mpeak PG. Moderate MR. Compared to prior TTE, LVEF appears  slightly less vigorous (55-60%), MR now appears severe, AS continues to  appear severe.   RIGHT/LEFT HEART CATH AND CORONARY ANGIOGRAPHY  Conclusion   . Ost Cx to Prox Cx lesion is 60% stenosed. . 2nd Mrg lesion is 80% stenosed. . Prox RCA to Mid RCA lesion is 25% stenosed. . Previously placed Prox LAD to Mid LAD stent (unknown type) is widely patent. . LV end diastolic pressure is normal. . Hemodynamic findings consistent with mild pulmonary hypertension. . There is moderate aortic valve stenosis. . Aortic saturation 94%, PA saturation 66%, PA pressure 43/11, mean PA pressure 26 mmHg, mean pulmonary capillary wedge pressure 12 mmHg, prominent V waves noted on the wedge tracing; cardiac output 4.87 L/min, cardiac index 2.4.  CAD noted in the circumflex system. Given the patient's lack of angina, intervention was deferred. Continue with plans for TAVR work-up.  Contrast administration limited due to renal insufficiency.     Neuro/Psych negative neurological ROS  negative psych ROS   GI/Hepatic Neg liver ROS, hiatal hernia,   Endo/Other  Hypothyroidism   Renal/GU  Renal InsufficiencyRenal disease     Musculoskeletal  (+) Arthritis , RIGHT FEMORAL NECK FRACTURE   Abdominal   Peds  Hematology  (+) Blood dyscrasia  (Xarelto, thrombocytopenia), anemia ,   Anesthesia Other Findings Day of surgery medications reviewed with the patient.  Reproductive/Obstetrics                           Anesthesia Physical  Anesthesia Plan  ASA: 4  Anesthesia Plan: MAC   Post-op Pain Management: Tylenol PO (pre-op)*   Induction: Intravenous  PONV Risk Score and Plan: 2 and Ondansetron, Propofol infusion and Treatment may vary due to age or medical condition  Airway Management Planned: Natural Airway and Simple Face Mask  Additional Equipment:   Intra-op Plan:   Post-operative Plan:   Informed Consent: I have reviewed the patients History and Physical, chart, labs and discussed the procedure including the risks, benefits and alternatives for the proposed anesthesia with the patient or authorized representative who has indicated his/her understanding and acceptance.     Dental advisory given  Plan Discussed with: Anesthesiologist and CRNA  Anesthesia Plan Comments:       Anesthesia Quick Evaluation

## 2022-04-11 NOTE — Telephone Encounter (Signed)
I ordered the patient a new monitor. He should have it in 7-10 business days. I gave the patient the device clinic number to call in case he needs help. The patient was very appreciative of my call.

## 2022-04-12 ENCOUNTER — Encounter (HOSPITAL_COMMUNITY): Payer: Self-pay | Admitting: Cardiovascular Disease

## 2022-04-12 ENCOUNTER — Other Ambulatory Visit: Payer: Self-pay | Admitting: Physician Assistant

## 2022-04-12 ENCOUNTER — Encounter (HOSPITAL_COMMUNITY)
Admission: RE | Disposition: A | Discharge status: Home / Self Care | Payer: Medicare Other | Source: Home / Self Care | Attending: Cardiovascular Disease

## 2022-04-12 ENCOUNTER — Inpatient Hospital Stay (HOSPITAL_COMMUNITY): Payer: Medicare Other | Admitting: Anesthesiology

## 2022-04-12 ENCOUNTER — Inpatient Hospital Stay (HOSPITAL_COMMUNITY): Payer: Medicare Other | Admitting: Physician Assistant

## 2022-04-12 ENCOUNTER — Inpatient Hospital Stay (HOSPITAL_COMMUNITY)
Admission: RE | Admit: 2022-04-12 | Discharge: 2022-04-13 | DRG: 267 | Disposition: A | Discharge status: Home / Self Care | Payer: Medicare Other | Attending: Cardiovascular Disease | Admitting: Cardiovascular Disease

## 2022-04-12 ENCOUNTER — Other Ambulatory Visit: Payer: Self-pay

## 2022-04-12 ENCOUNTER — Inpatient Hospital Stay (HOSPITAL_COMMUNITY): Payer: Medicare Other

## 2022-04-12 DIAGNOSIS — I480 Paroxysmal atrial fibrillation: Secondary | ICD-10-CM | POA: Diagnosis present

## 2022-04-12 DIAGNOSIS — I10 Essential (primary) hypertension: Secondary | ICD-10-CM | POA: Diagnosis present

## 2022-04-12 DIAGNOSIS — M1990 Unspecified osteoarthritis, unspecified site: Secondary | ICD-10-CM | POA: Diagnosis present

## 2022-04-12 DIAGNOSIS — I5032 Chronic diastolic (congestive) heart failure: Secondary | ICD-10-CM | POA: Diagnosis present

## 2022-04-12 DIAGNOSIS — I350 Nonrheumatic aortic (valve) stenosis: Secondary | ICD-10-CM | POA: Diagnosis not present

## 2022-04-12 DIAGNOSIS — Z96641 Presence of right artificial hip joint: Secondary | ICD-10-CM | POA: Diagnosis present

## 2022-04-12 DIAGNOSIS — Z952 Presence of prosthetic heart valve: Secondary | ICD-10-CM | POA: Diagnosis not present

## 2022-04-12 DIAGNOSIS — G4733 Obstructive sleep apnea (adult) (pediatric): Secondary | ICD-10-CM | POA: Diagnosis present

## 2022-04-12 DIAGNOSIS — I441 Atrioventricular block, second degree: Secondary | ICD-10-CM | POA: Diagnosis present

## 2022-04-12 DIAGNOSIS — I272 Pulmonary hypertension, unspecified: Secondary | ICD-10-CM | POA: Diagnosis present

## 2022-04-12 DIAGNOSIS — I2510 Atherosclerotic heart disease of native coronary artery without angina pectoris: Secondary | ICD-10-CM

## 2022-04-12 DIAGNOSIS — E785 Hyperlipidemia, unspecified: Secondary | ICD-10-CM | POA: Diagnosis present

## 2022-04-12 DIAGNOSIS — E039 Hypothyroidism, unspecified: Secondary | ICD-10-CM | POA: Diagnosis present

## 2022-04-12 DIAGNOSIS — Z96611 Presence of right artificial shoulder joint: Secondary | ICD-10-CM | POA: Diagnosis present

## 2022-04-12 DIAGNOSIS — I255 Ischemic cardiomyopathy: Secondary | ICD-10-CM | POA: Diagnosis present

## 2022-04-12 DIAGNOSIS — N183 Chronic kidney disease, stage 3 unspecified: Secondary | ICD-10-CM | POA: Diagnosis present

## 2022-04-12 DIAGNOSIS — E559 Vitamin D deficiency, unspecified: Secondary | ICD-10-CM | POA: Diagnosis present

## 2022-04-12 DIAGNOSIS — I442 Atrioventricular block, complete: Secondary | ICD-10-CM | POA: Diagnosis present

## 2022-04-12 DIAGNOSIS — Z87891 Personal history of nicotine dependence: Secondary | ICD-10-CM

## 2022-04-12 DIAGNOSIS — I130 Hypertensive heart and chronic kidney disease with heart failure and stage 1 through stage 4 chronic kidney disease, or unspecified chronic kidney disease: Secondary | ICD-10-CM | POA: Diagnosis present

## 2022-04-12 DIAGNOSIS — Z950 Presence of cardiac pacemaker: Secondary | ICD-10-CM | POA: Diagnosis not present

## 2022-04-12 DIAGNOSIS — Z955 Presence of coronary angioplasty implant and graft: Secondary | ICD-10-CM

## 2022-04-12 DIAGNOSIS — I110 Hypertensive heart disease with heart failure: Secondary | ICD-10-CM | POA: Diagnosis not present

## 2022-04-12 DIAGNOSIS — I080 Rheumatic disorders of both mitral and aortic valves: Secondary | ICD-10-CM | POA: Diagnosis present

## 2022-04-12 DIAGNOSIS — Z885 Allergy status to narcotic agent status: Secondary | ICD-10-CM

## 2022-04-12 DIAGNOSIS — N1832 Chronic kidney disease, stage 3b: Secondary | ICD-10-CM | POA: Diagnosis present

## 2022-04-12 DIAGNOSIS — I77810 Thoracic aortic ectasia: Secondary | ICD-10-CM | POA: Diagnosis present

## 2022-04-12 DIAGNOSIS — Z96653 Presence of artificial knee joint, bilateral: Secondary | ICD-10-CM | POA: Diagnosis present

## 2022-04-12 DIAGNOSIS — Z006 Encounter for examination for normal comparison and control in clinical research program: Secondary | ICD-10-CM

## 2022-04-12 DIAGNOSIS — Z79899 Other long term (current) drug therapy: Secondary | ICD-10-CM

## 2022-04-12 DIAGNOSIS — I5022 Chronic systolic (congestive) heart failure: Secondary | ICD-10-CM | POA: Diagnosis not present

## 2022-04-12 DIAGNOSIS — Z823 Family history of stroke: Secondary | ICD-10-CM

## 2022-04-12 DIAGNOSIS — Z9079 Acquired absence of other genital organ(s): Secondary | ICD-10-CM

## 2022-04-12 DIAGNOSIS — Z880 Allergy status to penicillin: Secondary | ICD-10-CM

## 2022-04-12 DIAGNOSIS — N400 Enlarged prostate without lower urinary tract symptoms: Secondary | ICD-10-CM | POA: Diagnosis present

## 2022-04-12 DIAGNOSIS — I708 Atherosclerosis of other arteries: Secondary | ICD-10-CM | POA: Diagnosis present

## 2022-04-12 DIAGNOSIS — I340 Nonrheumatic mitral (valve) insufficiency: Secondary | ICD-10-CM | POA: Diagnosis present

## 2022-04-12 DIAGNOSIS — Z8249 Family history of ischemic heart disease and other diseases of the circulatory system: Secondary | ICD-10-CM

## 2022-04-12 DIAGNOSIS — Z87442 Personal history of urinary calculi: Secondary | ICD-10-CM

## 2022-04-12 DIAGNOSIS — Z7901 Long term (current) use of anticoagulants: Secondary | ICD-10-CM

## 2022-04-12 DIAGNOSIS — Z9989 Dependence on other enabling machines and devices: Secondary | ICD-10-CM

## 2022-04-12 HISTORY — PX: TRANSCATHETER AORTIC VALVE REPLACEMENT, TRANSFEMORAL: SHX6400

## 2022-04-12 HISTORY — DX: Nonrheumatic aortic (valve) stenosis: I35.0

## 2022-04-12 HISTORY — DX: Chronic kidney disease, stage 3 unspecified: N18.30

## 2022-04-12 HISTORY — DX: Nonrheumatic mitral (valve) insufficiency: I34.0

## 2022-04-12 HISTORY — DX: Chronic diastolic (congestive) heart failure: I50.32

## 2022-04-12 HISTORY — DX: Presence of prosthetic heart valve: Z95.2

## 2022-04-12 HISTORY — PX: INTRAOPERATIVE TRANSTHORACIC ECHOCARDIOGRAM: SHX6523

## 2022-04-12 LAB — POCT I-STAT, CHEM 8
BUN: 19 mg/dL (ref 8–23)
BUN: 19 mg/dL (ref 8–23)
BUN: 18 mg/dL (ref 8–23)
BUN: 19 mg/dL (ref 8–23)
Calcium, Ion: 1.19 mmol/L (ref 1.15–1.40)
Calcium, Ion: 1.23 mmol/L (ref 1.15–1.40)
Calcium, Ion: 1.21 mmol/L (ref 1.15–1.40)
Calcium, Ion: 1.21 mmol/L (ref 1.15–1.40)
Chloride: 105 mmol/L (ref 98–111)
Chloride: 103 mmol/L (ref 98–111)
Chloride: 103 mmol/L (ref 98–111)
Chloride: 104 mmol/L (ref 98–111)
Creatinine, Ser: 1.7 mg/dL — ABNORMAL HIGH (ref 0.61–1.24)
Creatinine, Ser: 1.5 mg/dL — ABNORMAL HIGH (ref 0.61–1.24)
Creatinine, Ser: 1.6 mg/dL — ABNORMAL HIGH (ref 0.61–1.24)
Creatinine, Ser: 1.6 mg/dL — ABNORMAL HIGH (ref 0.61–1.24)
Glucose, Bld: 98 mg/dL (ref 70–99)
Glucose, Bld: 105 mg/dL — ABNORMAL HIGH (ref 70–99)
Glucose, Bld: 103 mg/dL — ABNORMAL HIGH (ref 70–99)
Glucose, Bld: 105 mg/dL — ABNORMAL HIGH (ref 70–99)
HCT: 33 % — ABNORMAL LOW (ref 39.0–52.0)
HCT: 32 % — ABNORMAL LOW (ref 39.0–52.0)
HCT: 30 % — ABNORMAL LOW (ref 39.0–52.0)
HCT: 32 % — ABNORMAL LOW (ref 39.0–52.0)
Hemoglobin: 11.2 g/dL — ABNORMAL LOW (ref 13.0–17.0)
Hemoglobin: 10.9 g/dL — ABNORMAL LOW (ref 13.0–17.0)
Hemoglobin: 10.2 g/dL — ABNORMAL LOW (ref 13.0–17.0)
Hemoglobin: 10.9 g/dL — ABNORMAL LOW (ref 13.0–17.0)
Potassium: 4.7 mmol/L (ref 3.5–5.1)
Potassium: 4.6 mmol/L (ref 3.5–5.1)
Potassium: 4.7 mmol/L (ref 3.5–5.1)
Potassium: 4.7 mmol/L (ref 3.5–5.1)
Sodium: 139 mmol/L (ref 135–145)
Sodium: 140 mmol/L (ref 135–145)
Sodium: 140 mmol/L (ref 135–145)
Sodium: 140 mmol/L (ref 135–145)
TCO2: 22 mmol/L (ref 22–32)
TCO2: 25 mmol/L (ref 22–32)
TCO2: 25 mmol/L (ref 22–32)
TCO2: 27 mmol/L (ref 22–32)

## 2022-04-12 LAB — ECHOCARDIOGRAM LIMITED
AR max vel: 1.41 cm2
AV Area VTI: 1.33 cm2
AV Area mean vel: 1.34 cm2
AV Mean grad: 20.5 mmHg
AV Peak grad: 27.8 mmHg
Ao pk vel: 2.64 m/s

## 2022-04-12 SURGERY — IMPLANTATION, AORTIC VALVE, TRANSCATHETER, FEMORAL APPROACH
Anesthesia: General | Site: Chest

## 2022-04-12 MED ORDER — OXYCODONE HCL 5 MG PO TABS: 5.0000 mg | ORAL_TABLET | ORAL | Status: DC | PRN

## 2022-04-12 MED ORDER — HEPARIN 6000 UNIT IRRIGATION SOLUTION
Status: AC
  Filled 2022-04-12: qty 1500

## 2022-04-12 MED ORDER — PROPOFOL 500 MG/50ML IV EMUL
INTRAVENOUS | Status: DC | PRN
  Administered 2022-04-12: 25 ug/kg/min via INTRAVENOUS

## 2022-04-12 MED ORDER — ACETAMINOPHEN 650 MG RE SUPP: 650.0000 mg | Freq: Four times a day (QID) | RECTAL | Status: DC | PRN

## 2022-04-12 MED ORDER — ONDANSETRON HCL 4 MG/2ML IJ SOLN: 4.0000 mg | Freq: Four times a day (QID) | INTRAMUSCULAR | Status: DC | PRN

## 2022-04-12 MED ORDER — CHLORHEXIDINE GLUCONATE 0.12 % MT SOLN
15.0000 mL | Freq: Once | OROMUCOSAL | Status: DC
  Filled 2022-04-12: qty 15

## 2022-04-12 MED ORDER — SODIUM CHLORIDE 0.9 % IV SOLN: INTRAVENOUS | Status: AC

## 2022-04-12 MED ORDER — ORAL CARE MOUTH RINSE: 15.0000 mL | Freq: Once | OROMUCOSAL | Status: AC

## 2022-04-12 MED ORDER — NITROGLYCERIN IN D5W 200-5 MCG/ML-% IV SOLN: 0.0000 ug/min | INTRAVENOUS | Status: DC

## 2022-04-12 MED ORDER — CHLORHEXIDINE GLUCONATE 4 % EX LIQD: 60.0000 mL | Freq: Once | CUTANEOUS | Status: DC

## 2022-04-12 MED ORDER — ATORVASTATIN CALCIUM 10 MG PO TABS
10.0000 mg | ORAL_TABLET | Freq: Every day | ORAL | Status: DC
  Administered 2022-04-12 – 2022-04-13 (×2): 10 mg via ORAL
  Filled 2022-04-12 (×2): qty 1

## 2022-04-12 MED ORDER — SODIUM CHLORIDE 0.9% FLUSH
3.0000 mL | Freq: Two times a day (BID) | INTRAVENOUS | Status: DC
  Administered 2022-04-12 – 2022-04-13 (×2): 3 mL via INTRAVENOUS

## 2022-04-12 MED ORDER — ACETAMINOPHEN 325 MG PO TABS: 650.0000 mg | ORAL_TABLET | Freq: Four times a day (QID) | ORAL | Status: DC | PRN

## 2022-04-12 MED ORDER — HEPARIN SODIUM (PORCINE) 1000 UNIT/ML IJ SOLN
INTRAMUSCULAR | Status: DC | PRN
  Administered 2022-04-12: 9000 [IU] via INTRAVENOUS

## 2022-04-12 MED ORDER — LIDOCAINE HCL 1 % IJ SOLN
INTRAMUSCULAR | Status: DC | PRN
  Administered 2022-04-12: 10 mL

## 2022-04-12 MED ORDER — MORPHINE SULFATE (PF) 2 MG/ML IV SOLN: 1.0000 mg | INTRAVENOUS | Status: DC | PRN

## 2022-04-12 MED ORDER — LACTATED RINGERS IV SOLN: INTRAVENOUS | Status: DC

## 2022-04-12 MED ORDER — PROPOFOL 1000 MG/100ML IV EMUL
INTRAVENOUS | Status: AC
  Filled 2022-04-12: qty 100

## 2022-04-12 MED ORDER — SODIUM CHLORIDE 0.9 % IV SOLN: INTRAVENOUS | Status: DC

## 2022-04-12 MED ORDER — SODIUM CHLORIDE 0.9 % IV SOLN: 250.0000 mL | INTRAVENOUS | Status: DC | PRN

## 2022-04-12 MED ORDER — PROTAMINE SULFATE 10 MG/ML IV SOLN
INTRAVENOUS | Status: DC | PRN
  Administered 2022-04-12: 90 mg via INTRAVENOUS

## 2022-04-12 MED ORDER — AMIODARONE HCL 200 MG PO TABS
200.0000 mg | ORAL_TABLET | Freq: Every day | ORAL | Status: DC
  Administered 2022-04-12 – 2022-04-13 (×2): 200 mg via ORAL
  Filled 2022-04-12 (×2): qty 1

## 2022-04-12 MED ORDER — IODIXANOL 320 MG/ML IV SOLN
INTRAVENOUS | Status: DC | PRN
  Administered 2022-04-12: 32 mL

## 2022-04-12 MED ORDER — TRAMADOL HCL 50 MG PO TABS: 50.0000 mg | ORAL_TABLET | ORAL | Status: DC | PRN

## 2022-04-12 MED ORDER — FENTANYL CITRATE (PF) 100 MCG/2ML IJ SOLN
INTRAMUSCULAR | Status: DC | PRN
  Administered 2022-04-12: 50 ug via INTRAVENOUS

## 2022-04-12 MED ORDER — SODIUM CHLORIDE 0.9% FLUSH: 3.0000 mL | INTRAVENOUS | Status: DC | PRN

## 2022-04-12 MED ORDER — VANCOMYCIN HCL IN DEXTROSE 1-5 GM/200ML-% IV SOLN
1000.0000 mg | Freq: Once | INTRAVENOUS | Status: AC
  Administered 2022-04-12: 1000 mg via INTRAVENOUS
  Filled 2022-04-12: qty 200

## 2022-04-12 MED ORDER — CHLORHEXIDINE GLUCONATE 0.12 % MT SOLN
15.0000 mL | Freq: Once | OROMUCOSAL | Status: AC
  Administered 2022-04-12: 15 mL via OROMUCOSAL

## 2022-04-12 MED ORDER — LIDOCAINE HCL (PF) 1 % IJ SOLN
INTRAMUSCULAR | Status: AC
  Filled 2022-04-12: qty 30

## 2022-04-12 MED ORDER — HEPARIN 6000 UNIT IRRIGATION SOLUTION
Status: DC | PRN
  Administered 2022-04-12: 3

## 2022-04-12 MED ORDER — FENTANYL CITRATE (PF) 250 MCG/5ML IJ SOLN
INTRAMUSCULAR | Status: AC
  Filled 2022-04-12: qty 5

## 2022-04-12 MED ORDER — HEPARIN SODIUM (PORCINE) 1000 UNIT/ML IJ SOLN
INTRAMUSCULAR | Status: AC
  Filled 2022-04-12: qty 10

## 2022-04-12 MED ORDER — CHLORHEXIDINE GLUCONATE 4 % EX LIQD: 30.0000 mL | CUTANEOUS | Status: DC

## 2022-04-12 MED ORDER — NOREPINEPHRINE BITARTRATE 1 MG/ML IV SOLN: 0.0000 ug/min | INTRAVENOUS | Status: DC

## 2022-04-12 MED ORDER — ACETAMINOPHEN 500 MG PO TABS
1000.0000 mg | ORAL_TABLET | Freq: Once | ORAL | Status: AC
  Administered 2022-04-12: 1000 mg via ORAL
  Filled 2022-04-12: qty 2

## 2022-04-12 SURGICAL SUPPLY — 66 items
ADH SKN CLS APL DERMABOND .7 (GAUZE/BANDAGES/DRESSINGS) ×2
APL PRP STRL LF DISP 70% ISPRP (MISCELLANEOUS) ×1
BAG COUNTER SPONGE SURGICOUNT (BAG) ×2 IMPLANT
BAG DECANTER FOR FLEXI CONT (MISCELLANEOUS) IMPLANT
BAG SPNG CNTER NS LX DISP (BAG) ×1
BLADE CLIPPER SURG (BLADE) IMPLANT
BLADE STERNUM SYSTEM 6 (BLADE) IMPLANT
CABLE ADAPT CONN TEMP 6FT (ADAPTER) ×2 IMPLANT
CANISTER SUCT 3000ML PPV (MISCELLANEOUS) IMPLANT
CATH DIAG EXPO 6F AL1 (CATHETERS) IMPLANT
CATH DIAG EXPO 6F VENT PIG 145 (CATHETERS) ×4 IMPLANT
CATH INFINITI 6F AL2 (CATHETERS) ×1 IMPLANT
CATH S G BIP PACING (CATHETERS) ×2 IMPLANT
CHLORAPREP W/TINT 26 (MISCELLANEOUS) ×2 IMPLANT
CLOSURE MYNX CONTROL 6F/7F (Vascular Products) ×1 IMPLANT
CNTNR URN SCR LID CUP LEK RST (MISCELLANEOUS) ×2 IMPLANT
CONT SPEC 4OZ STRL OR WHT (MISCELLANEOUS) ×4
COVER BACK TABLE 80X110 HD (DRAPES) IMPLANT
DERMABOND ADVANCED (GAUZE/BANDAGES/DRESSINGS) ×2
DERMABOND ADVANCED .7 DNX12 (GAUZE/BANDAGES/DRESSINGS) ×1 IMPLANT
DEVICE CLOSURE PERCLS PRGLD 6F (VASCULAR PRODUCTS) ×2 IMPLANT
DRSG TEGADERM 4X4.75 (GAUZE/BANDAGES/DRESSINGS) ×4 IMPLANT
ELECT REM PT RETURN 9FT ADLT (ELECTROSURGICAL) ×2
ELECTRODE REM PT RTRN 9FT ADLT (ELECTROSURGICAL) ×1 IMPLANT
GAUZE SPONGE 4X4 12PLY STRL (GAUZE/BANDAGES/DRESSINGS) ×2 IMPLANT
GLOVE BIO SURGEON STRL SZ7.5 (GLOVE) IMPLANT
GLOVE BIO SURGEON STRL SZ8 (GLOVE) IMPLANT
GLOVE ECLIPSE 7.0 STRL STRAW (GLOVE) ×2 IMPLANT
GLOVE ORTHO TXT STRL SZ7.5 (GLOVE) IMPLANT
GOWN STRL REUS W/ TWL LRG LVL3 (GOWN DISPOSABLE) IMPLANT
GOWN STRL REUS W/ TWL XL LVL3 (GOWN DISPOSABLE) ×1 IMPLANT
GOWN STRL REUS W/TWL LRG LVL3 (GOWN DISPOSABLE)
GOWN STRL REUS W/TWL XL LVL3 (GOWN DISPOSABLE) ×2
GUIDEWIRE SAF TJ AMPL .035X180 (WIRE) ×2 IMPLANT
GUIDEWIRE SAFE TJ AMPLATZ EXST (WIRE) ×2 IMPLANT
KIT BASIN OR (CUSTOM PROCEDURE TRAY) ×2 IMPLANT
KIT HEART LEFT (KITS) ×2 IMPLANT
KIT SAPIAN 3 ULTRA RESILIA 26 (Valve) ×1 IMPLANT
KIT TURNOVER KIT B (KITS) ×2 IMPLANT
NS IRRIG 1000ML POUR BTL (IV SOLUTION) ×2 IMPLANT
PACK ENDO MINOR (CUSTOM PROCEDURE TRAY) ×2 IMPLANT
PAD ARMBOARD 7.5X6 YLW CONV (MISCELLANEOUS) ×4 IMPLANT
PAD ELECT DEFIB RADIOL ZOLL (MISCELLANEOUS) ×2 IMPLANT
PERCLOSE PROGLIDE 6F (VASCULAR PRODUCTS) ×4
POSITIONER HEAD DONUT 9IN (MISCELLANEOUS) ×2 IMPLANT
SET MICROPUNCTURE 5F STIFF (MISCELLANEOUS) ×2 IMPLANT
SHEATH BRITE TIP 7FR 35CM (SHEATH) ×2 IMPLANT
SHEATH PINNACLE 6F 10CM (SHEATH) ×2 IMPLANT
SHEATH PINNACLE 8F 10CM (SHEATH) ×2 IMPLANT
SLEEVE REPOSITIONING LENGTH 30 (MISCELLANEOUS) ×2 IMPLANT
SPIKE FLUID TRANSFER (MISCELLANEOUS) ×2 IMPLANT
SPONGE GAUZE 2X2 8PLY STRL LF (GAUZE/BANDAGES/DRESSINGS) ×2 IMPLANT
STOPCOCK MORSE 400PSI 3WAY (MISCELLANEOUS) ×4 IMPLANT
SUT PROLENE 6 0 C 1 30 (SUTURE) IMPLANT
SUT SILK  1 MH (SUTURE) ×2
SUT SILK 1 MH (SUTURE) ×1 IMPLANT
SYR 50ML LL SCALE MARK (SYRINGE) ×2 IMPLANT
SYR BULB IRRIG 60ML STRL (SYRINGE) IMPLANT
TOWEL GREEN STERILE (TOWEL DISPOSABLE) ×4 IMPLANT
TRANSDUCER W/STOPCOCK (MISCELLANEOUS) ×4 IMPLANT
TRAY FOLEY SLVR 14FR TEMP STAT (SET/KITS/TRAYS/PACK) IMPLANT
TUBE SUCT INTRACARD DLP 20F (MISCELLANEOUS) IMPLANT
WIRE EMERALD 3MM-J .035X150CM (WIRE) ×2 IMPLANT
WIRE EMERALD 3MM-J .035X260CM (WIRE) ×2 IMPLANT
WIRE EMERALD ST .035X260CM (WIRE) ×2 IMPLANT
WIRE SAFARI SM CURVE 275 (WIRE) ×1 IMPLANT

## 2022-04-12 NOTE — Interval H&P Note (Signed)
History and Physical Interval Note:  04/12/2022 9:24 AM  Gregory Mccormick  has presented today for surgery, with the diagnosis of Severe Aortic Stenosis.  The various methods of treatment have been discussed with the patient and family. After consideration of risks, benefits and other options for treatment, the patient has consented to  Procedure(s): Transcatheter Aortic Valve Replacement, Transfemoral (N/A) INTRAOPERATIVE TRANSTHORACIC ECHOCARDIOGRAM (N/A) as a surgical intervention.  The patient's history has been reviewed, patient examined, no change in status, stable for surgery.  I have reviewed the patient's chart and labs.  Questions were answered to the patient's satisfaction.     [name]

## 2022-04-12 NOTE — Op Note (Signed)
[facility]   MULTIDISCIPLINARY HEART VALVE TEAM   TAVR OPERATIVE NOTE   Date of Procedure:  04/12/2022  Preoperative Diagnosis: Severe Aortic Stenosis   Postoperative Diagnosis: Same   Procedure:   Transcatheter Aortic Valve Replacement - Percutaneous Right Transfemoral Approach  Edwards Sapien 3 Ultra Resilia THV (size 26 mm, model # 9755RSL, serial # [id number])   Co-Surgeons:  Gaye Pollack, MD and Sherren Mocha, MD   Anesthesiologist:  Tamela Gammon, MD  Echocardiographer:  Rudean Haskell, MD  Pre-operative Echo Findings: Severe aortic stenosis Normal left ventricular systolic function  Post-operative Echo Findings: No paravalvular leak Unchanged left ventricular systolic function   BRIEF CLINICAL NOTE AND INDICATIONS FOR SURGERY  This 88 year old active gentleman has stage D, severe, symptomatic aortic stenosis with New York Heart Association class II symptoms of exertional fatigue and shortness of breath consistent with chronic diastolic congestive heart failure.  He has also had episodes of dizziness with standing.  I have personally reviewed his 2D echocardiogram, cardiac catheterization, and CTA studies.  His echocardiogram shows a trileaflet aortic valve with severe calcification and thickening and restricted mobility.  The mean gradient is 31 mmHg with a valve area of 1.1 cm by VTI.  Dimensionless index is 0.18.  Left ventricular ejection fraction is 55 to 60% with mild concentric LVH.  Cardiac catheterization shows patent previously placed stents in the proximal and mid LAD.  There is 60% proximal left circumflex and 80% second marginal stenosis.  He has had no anginal symptoms and this can be treated medically.  I agree that aortic valve replacement is indicated in this patient for relief of his lifestyle limiting symptoms and to prevent progressive left ventricular dysfunction and congestive heart failure.  Given his advanced age I think  transcatheter aortic valve replacement would be the best treatment for him.  His gated cardiac CTA shows anatomy suitable for TAVR using a SAPIEN 3 valve.  His abdominal and pelvic CTA shows adequate pelvic vascular anatomy to allow transfemoral insertion.   The patient and his son were counseled at length regarding treatment alternatives for management of severe symptomatic aortic stenosis. The risks and benefits of surgical intervention has been discussed in detail. Long-term prognosis with medical therapy was discussed. Alternative approaches such as conventional surgical aortic valve replacement, transcatheter aortic valve replacement, and palliative medical therapy were compared and contrasted at length. This discussion was placed in the context of the patient's own specific clinical presentation and past medical history. All of their questions have been addressed.    Following the decision to proceed with transcatheter aortic valve replacement, a discussion was held regarding what types of management strategies would be attempted intraoperatively in the event of life-threatening complications, including whether or not the patient would be considered a candidate for the use of cardiopulmonary bypass and/or conversion to open sternotomy for attempted surgical intervention.  Given his advanced age I would only consider emergent sternotomy to drain a pericardial effusion or repair a ventricular perforation.  The patient is aware of the fact that transient use of cardiopulmonary bypass may be necessary. The patient has been advised of a variety of complications that might develop including but not limited to risks of death, stroke, paravalvular leak, aortic dissection or other major vascular complications, aortic annulus rupture, device embolization, cardiac rupture or perforation, mitral regurgitation, acute myocardial infarction, arrhythmia, heart block or bradycardia requiring permanent pacemaker placement,  congestive heart failure, respiratory failure, renal failure, pneumonia, infection, other late complications related to  structural valve deterioration or migration, or other complications that might ultimately cause a temporary or permanent loss of functional independence or other long term morbidity. The patient provides full informed consent for the procedure as described and all questions were answered.     DETAILS OF THE OPERATIVE PROCEDURE  PREPARATION:    The patient was brought to the operating room on the above mentioned date and appropriate monitoring was established by the anesthesia team. The patient was placed in the supine position on the operating table.  Intravenous antibiotics were administered. The patient was monitored closely throughout the procedure under conscious sedation.    Baseline transthoracic echocardiogram was performed. The patient's abdomen and both groins were prepped and draped in a sterile manner. A time out procedure was performed.   PERIPHERAL ACCESS:    Using the modified Seldinger technique, femoral arterial and venous access was obtained with placement of 6 Fr sheaths on the left side.  A pigtail diagnostic catheter was passed through the left arterial sheath under fluoroscopic guidance into the aortic root.  A temporary transvenous pacemaker catheter was passed through the left femoral venous sheath under fluoroscopic guidance into the right ventricle.  The pacemaker was tested to ensure stable lead placement and pacemaker capture. Aortic root angiography was performed in order to determine the optimal angiographic angle for valve deployment.   TRANSFEMORAL ACCESS:   Percutaneous transfemoral access and sheath placement was performed using ultrasound guidance.  The right common femoral artery was cannulated using a micropuncture needle and appropriate location was verified using hand injection angiogram.  A pair of Abbott Perclose percutaneous closure  devices were placed and a 6 French sheath replaced into the femoral artery.  The patient was heparinized systemically and ACT verified > 250 seconds.    A 14 Fr transfemoral E-sheath was introduced into the right common femoral artery after progressively dilating over an Amplatz superstiff wire. An AL-2 catheter was used to direct a straight-tip exchange length wire across the native aortic valve into the left ventricle. This was exchanged out for a pigtail catheter and position was confirmed in the LV apex. Simultaneous LV and Ao pressures were recorded.  The pigtail catheter was exchanged for a Safari wire in the LV apex.   BALLOON AORTIC VALVULOPLASTY:   Not performed   TRANSCATHETER HEART VALVE DEPLOYMENT:   An Edwards Sapien 3 Ultra transcatheter heart valve (size 26 mm) was prepared and crimped per manufacturer's guidelines, and the proper orientation of the valve is confirmed on the [employer] delivery system. The valve was advanced through the introducer sheath using normal technique until in an appropriate position in the abdominal aorta beyond the sheath tip. The balloon was then retracted and using the fine-tuning wheel was centered on the valve. The valve was then advanced across the aortic arch using appropriate flexion of the catheter. The valve was carefully positioned across the aortic valve annulus. The Commander catheter was retracted using normal technique. Once final position of the valve has been confirmed by angiographic assessment, the valve is deployed during rapid ventricular pacing to maintain systolic blood pressure < 50 mmHg and pulse pressure < 10 mmHg. The balloon inflation is held for >3 seconds after reaching full deployment volume. Once the balloon has fully deflated the balloon is retracted into the ascending aorta and valve function is assessed using echocardiography. There is felt to be no paravalvular leak and no central aortic insufficiency.  The patient's  hemodynamic recovery following valve deployment is good.  The deployment balloon and guidewire are both removed.    PROCEDURE COMPLETION:   The sheath was removed and femoral artery closure performed.  Protamine was administered once femoral arterial repair was complete. The temporary pacemaker, pigtail catheter and femoral sheaths were removed with manual pressure used for venous hemostasis.  A Mynx femoral closure device was utilized following removal of the diagnostic sheath in the left femoral artery.  The patient tolerated the procedure well and is transported to the cath lab recovery area in stable condition. There were no immediate intraoperative complications. All sponge instrument and needle counts are verified correct at completion of the operation.   No blood products were administered during the operation.  The patient received a total of 40 mL of intravenous contrast during the procedure.   Gaye Pollack, MD 04/12/2022

## 2022-04-12 NOTE — Op Note (Signed)
[facility]   MULTIDISCIPLINARY HEART VALVE TEAM   TAVR OPERATIVE NOTE   Date of Procedure:  04/12/2022  Preoperative Diagnosis: Severe Aortic Stenosis   Postoperative Diagnosis: Same   Procedure:   Transcatheter Aortic Valve Replacement - Percutaneous Transfemoral Approach  Edwards Sapien 3 Ultra Resilia THV (size 26 mm, serial # [id number])   Co-Surgeons:  Gaye Pollack, MD and Sherren Mocha, MD  Anesthesiologist:  Tamela Gammon, MD  Echocardiographer:  Rudean Haskell, MD  Pre-operative Echo Findings: Severe aortic stenosis Normal left ventricular systolic function  Post-operative Echo Findings: No paravalvular leak Unchanged left ventricular systolic function  BRIEF CLINICAL NOTE AND INDICATIONS FOR SURGERY  88 year old gentleman with hypertension, hyperlipidemia, stage IIIb chronic kidney disease, paroxysmal atrial fibrillation on apixaban, 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  During the course of the patient's preoperative work up they have been evaluated comprehensively by a multidisciplinary team of specialists coordinated through the [facility] the [facility].  They have been demonstrated to suffer from symptomatic severe aortic stenosis as noted above. The patient has been counseled extensively as to the relative risks and benefits of all options for the treatment of severe aortic stenosis including long term medical therapy, conventional surgery for aortic valve replacement, and transcatheter aortic valve replacement.  The patient has been independently evaluated in formal cardiac surgical consultation by Dr Cyndia Bent, who deemed the patient appropriate for TAVR. Based upon review of all of the patient's preoperative diagnostic tests  they are felt to be candidate for transcatheter aortic valve replacement using the transfemoral approach as an alternative to conventional surgery.    Following the decision to proceed with transcatheter aortic valve replacement, a discussion has been held regarding what types of management strategies would be attempted intraoperatively in the event of life-threatening complications, including whether or not the patient would be considered a candidate for the use of cardiopulmonary bypass and/or conversion to open sternotomy for attempted surgical intervention.  The patient has been advised of a variety of complications that might develop peculiar to this approach including but not limited to risks of death, stroke, paravalvular leak, aortic dissection or other major vascular complications, aortic annulus rupture, device embolization, cardiac rupture or perforation, acute myocardial infarction, arrhythmia, heart block or bradycardia requiring permanent pacemaker placement, congestive heart failure, respiratory failure, renal failure, pneumonia, infection, other late complications related to structural valve deterioration or migration, or other complications that might ultimately cause a temporary or permanent loss of functional independence or other long term morbidity.  The patient provides full informed consent for the procedure as described and all questions were answered preoperatively.  DETAILS OF THE OPERATIVE PROCEDURE  PREPARATION:   The patient is brought to the operating room on the above mentioned date and central monitoring was established by the anesthesia team including placement of a radial arterial line. The patient is placed in the supine position on the operating table.  Intravenous antibiotics are administered. The patient is monitored closely throughout the procedure under conscious sedation.  Baseline transthoracic echocardiogram is performed. The patient's chest, abdomen, both groins,  and both lower extremities are prepared and draped in a sterile manner. A time out procedure is performed.   PERIPHERAL ACCESS:   Using ultrasound guidance, femoral arterial and venous access is obtained with placement of 6 Fr sheaths  on the left side.  [place] images are digitally captured and stored in the patient's chart. A pigtail diagnostic catheter was passed through the femoral arterial sheath under fluoroscopic guidance into the aortic root.  A temporary transvenous pacemaker catheter was passed through the femoral venous sheath under fluoroscopic guidance into the right ventricle.  The pacemaker was tested to ensure stable lead placement and pacemaker capture. Aortic root angiography was performed in order to determine the optimal angiographic angle for valve deployment.  TRANSFEMORAL ACCESS:  A micropuncture technique is used to access the right femoral artery under fluoroscopic and ultrasound guidance.  2 Perclose devices are deployed at 10' and 2' positions to 'PreClose' the femoral artery. An 8 French sheath is placed and then an Amplatz Superstiff wire is advanced through the sheath. This is changed out for a 14 French transfemoral E-Sheath after progressively dilating over the Superstiff wire.  An AL-2 catheter was used to direct a straight-tip exchange length wire across the native aortic valve into the left ventricle. This was exchanged out for a pigtail catheter and position was confirmed in the LV apex. Simultaneous LV and Ao pressures were recorded.  The pigtail catheter was exchanged for an Amplatz Extra-stiff wire in the LV apex.    BALLOON AORTIC VALVULOPLASTY:  Not performed  TRANSCATHETER HEART VALVE DEPLOYMENT:  An Edwards Sapien 3 transcatheter heart valve (size 26 mm) was prepared and crimped per manufacturer's guidelines, and the proper orientation of the valve is confirmed on the [employer] delivery system. The valve was advanced through the introducer sheath using normal  technique until in an appropriate position in the abdominal aorta beyond the sheath tip. The balloon was then retracted and using the fine-tuning wheel was centered on the valve. The valve was then advanced across the aortic arch using appropriate flexion of the catheter. The valve was carefully positioned across the aortic valve annulus. The Commander catheter was retracted using normal technique. Once final position of the valve has been confirmed by angiographic assessment, the valve is deployed while temporarily holding ventilation and during rapid ventricular pacing to maintain systolic blood pressure < 50 mmHg and pulse pressure < 10 mmHg. The balloon inflation is held for >3 seconds after reaching full deployment volume. Once the balloon has fully deflated the balloon is retracted into the ascending aorta and valve function is assessed using echocardiography. The patient's hemodynamic recovery following valve deployment is good.  The deployment balloon and guidewire are both removed. Echo demostrated acceptable post-procedural gradients, stable mitral valve function, and no aortic insufficiency.    PROCEDURE COMPLETION:  The sheath was removed and femoral artery closure is performed using the 2 previously deployed Perclose devices.  Protamine is administered once femoral arterial repair was complete. The site is clear with no evidence of bleeding or hematoma after the sutures are tightened. The temporary pacemaker and pigtail catheters are removed. Mynx closure is used for contralateral femoral arterial hemostasis for the 6 Fr sheath.  The patient tolerated the procedure well and is transported to the recovery area in stable condition. There were no immediate intraoperative complications. All sponge instrument and needle counts are verified correct at completion of the operation.   The patient received a total of 40 mL of intravenous contrast during the procedure.   Sherren Mocha,  MD 04/12/2022 12:56 PM

## 2022-04-12 NOTE — Discharge Instructions (Signed)

## 2022-04-12 NOTE — Anesthesia Procedure Notes (Signed)
Arterial Line Insertion Start/End6/13/2023 9:30 AM, 04/12/2022 9:40 AM Performed by: Colon Flattery, CRNA, CRNA  Patient location: Pre-op. Preanesthetic checklist: patient identified, IV checked, site marked, risks and benefits discussed, surgical consent, monitors and equipment checked, pre-op evaluation, timeout performed and anesthesia consent Lidocaine 1% used for infiltration Left, radial was placed Catheter size: 20 G Hand hygiene performed  and maximum sterile barriers used   Attempts: 2 Procedure performed without using ultrasound guided technique. Following insertion, dressing applied and Biopatch. Post procedure assessment: normal  Patient tolerated the procedure well with no immediate complications.

## 2022-04-12 NOTE — Progress Notes (Signed)
Mobility Specialist: Progress Note   04/12/22 1708  Mobility  Activity Ambulated independently in hallway  Level of Assistance Contact guard assist, steadying assist  Assistive Device None  Distance Ambulated (ft) 100 ft  Activity Response Tolerated well  $Mobility charge 1 Mobility   Pre-Mobility: 60 HR Post-Mobility: 83 HR, 1005 SpO2  Received pt in bed having no complaints and agreeable to mobility. Pt was asymptomatic throughout ambulation and returned to room w/o fault. No bleeding noted after walk. Left sitting EOB w/ call bell in reach and all needs met.  [facility] Ubaldo Daywalt Mobility Specialist Mobility Specialist [address]: [phone]

## 2022-04-12 NOTE — Progress Notes (Signed)
  [facility]   MULTIDISCIPLINARY HEART VALVE TEAM  Patient doing well s/p TAVR. She is hemodynamically stable. Groin sites stable. ECG with no high grade block (has pacer in place). Arterial line discontinued and transferred to 4E. Plan for early ambulation after bedrest completed and hopeful discharge over the next 24-48 hours.   Cline Crock PA-C  [facility]  Pager [phone]

## 2022-04-12 NOTE — Anesthesia Procedure Notes (Signed)
Procedure Name: MAC Date/Time: 04/12/2022 11:12 AM  Performed by: Barrington Ellison, CRNAPre-anesthesia Checklist: Patient identified, Emergency Drugs available, Suction available, Patient being monitored and Timeout performed Patient Re-evaluated:Patient Re-evaluated prior to induction Oxygen Delivery Method: Simple face mask

## 2022-04-12 NOTE — Transfer of Care (Signed)
Immediate Anesthesia Transfer of Care Note  Patient: Gregory Mccormick  Procedure(s) Performed: Transcatheter Aortic Valve Replacement, Transfemoral (Chest) INTRAOPERATIVE TRANSTHORACIC ECHOCARDIOGRAM (Chest)  Patient Location: Cath Lab  Anesthesia Type:MAC  Level of Consciousness: awake, alert  and oriented  Airway & Oxygen Therapy: Patient Spontanous Breathing and Patient connected to nasal cannula oxygen  Post-op Assessment: Report given to RN, Post -op Vital signs reviewed and stable and Patient moving all extremities  Post vital signs: Reviewed and stable  Last Vitals:  Vitals Value Taken Time  BP 111/58 04/12/22 1252  Temp    Pulse 61 04/12/22 1254  Resp 20 04/12/22 1255  SpO2 96 % 04/12/22 1254  Vitals shown include unvalidated device data.  Last Pain:  Vitals:   04/12/22 0949  TempSrc:   PainSc: 0-No pain         Complications: No notable events documented.

## 2022-04-12 NOTE — Anesthesia Postprocedure Evaluation (Signed)
Anesthesia Post Note  Patient: REXTON GREULICH  Procedure(s) Performed: Transcatheter Aortic Valve Replacement, Transfemoral (Chest) INTRAOPERATIVE TRANSTHORACIC ECHOCARDIOGRAM (Chest)     Patient location during evaluation: PACU Anesthesia Type: MAC Level of consciousness: awake and alert Pain management: pain level controlled Vital Signs Assessment: post-procedure vital signs reviewed and stable Respiratory status: spontaneous breathing, nonlabored ventilation, respiratory function stable and patient connected to nasal cannula oxygen Cardiovascular status: stable and blood pressure returned to baseline Postop Assessment: no apparent nausea or vomiting Anesthetic complications: no   No notable events documented.  Last Vitals:  Vitals:   04/12/22 1340 04/12/22 1345  BP: 140/68 (!) 146/71  Pulse: 60 (!) 59  Resp: 16 18  Temp:    SpO2: 96% 99%    Last Pain:  Vitals:   04/12/22 1256  TempSrc:   PainSc: 0-No pain                 Camiyah Friberg DANIEL

## 2022-04-13 ENCOUNTER — Encounter (HOSPITAL_COMMUNITY): Payer: Self-pay | Admitting: Cardiovascular Disease

## 2022-04-13 ENCOUNTER — Inpatient Hospital Stay (HOSPITAL_COMMUNITY): Payer: Medicare Other

## 2022-04-13 DIAGNOSIS — Z952 Presence of prosthetic heart valve: Secondary | ICD-10-CM

## 2022-04-13 LAB — CBC
HCT: 33.4 % — ABNORMAL LOW (ref 39.0–52.0)
Hemoglobin: 10.9 g/dL — ABNORMAL LOW (ref 13.0–17.0)
MCH: 32.4 pg (ref 26.0–34.0)
MCHC: 32.6 g/dL (ref 30.0–36.0)
MCV: 99.4 fL (ref 80.0–100.0)
Platelets: 136 10*3/uL — ABNORMAL LOW (ref 150–400)
RBC: 3.36 MIL/uL — ABNORMAL LOW (ref 4.22–5.81)
RDW: 14.3 % (ref 11.5–15.5)
WBC: 6.4 10*3/uL (ref 4.0–10.5)
nRBC: 0 % (ref 0.0–0.2)

## 2022-04-13 LAB — ECHOCARDIOGRAM LIMITED
AR max vel: 1.76 cm2
AV Area VTI: 2.02 cm2
AV Area mean vel: 1.81 cm2
AV Mean grad: 11 mmHg
AV Peak grad: 19.7 mmHg
Ao pk vel: 2.22 m/s
Height: 68 in
Weight: 3092.8 oz

## 2022-04-13 LAB — BASIC METABOLIC PANEL
Anion gap: 8 (ref 5–15)
BUN: 21 mg/dL (ref 8–23)
CO2: 23 mmol/L (ref 22–32)
Calcium: 8.4 mg/dL — ABNORMAL LOW (ref 8.9–10.3)
Chloride: 104 mmol/L (ref 98–111)
Creatinine, Ser: 1.49 mg/dL — ABNORMAL HIGH (ref 0.61–1.24)
GFR, Estimated: 44 mL/min — ABNORMAL LOW (ref >60)
Glucose, Bld: 89 mg/dL (ref 70–99)
Potassium: 4.6 mmol/L (ref 3.5–5.1)
Sodium: 135 mmol/L (ref 135–145)

## 2022-04-13 LAB — MAGNESIUM: Magnesium: 2.2 mg/dL (ref 1.7–2.4)

## 2022-04-13 MED ORDER — CARVEDILOL 3.125 MG PO TABS: 3.1250 mg | ORAL_TABLET | Freq: Two times a day (BID) | ORAL | 11 refills | Status: DC

## 2022-04-13 MED FILL — Magnesium Sulfate Inj 50%: INTRAMUSCULAR | Qty: 10 | Status: CN

## 2022-04-13 MED FILL — Heparin Sodium (Porcine) Inj 1000 Unit/ML: Qty: 1000 | Status: CN

## 2022-04-13 MED FILL — Potassium Chloride Inj 2 mEq/ML: INTRAVENOUS | Qty: 40 | Status: CN

## 2022-04-13 NOTE — Progress Notes (Signed)
Echocardiogram 2D Echocardiogram has been performed.  Shalaya Swailes F Tyris Eliot RDCS 04/13/2022, 10:19 AM 

## 2022-04-13 NOTE — Discharge Summary (Addendum)
[place] VALVE TEAM  Discharge Summary    Patient ID: ABELARDO HARTSOCK MRN: [id number]; DOB: Mar 30, 1929  Admit date: 04/12/2022 Discharge date: 04/13/2022  Primary Care Provider: Antony Contras, MD  Primary Cardiologist: Larae Grooms, MD / Dr. Burt Knack & Dr. Cyndia Bent (TAVR)  Discharge Diagnoses    Principal Problem:   S/P TAVR (transcatheter aortic valve replacement) Active Problems:   Essential hypertension, benign   Hyperlipidemia   BPH (benign prostatic hyperplasia)   OSA on CPAP   Chronic diastolic (congestive) heart failure (HCC)   Presence of cardiac pacemaker   Hypothyroidism   CAD (coronary artery disease)   Chronic kidney disease, stage 3b (HCC)   Severe aortic stenosis   Chronic kidney disease (CKD), stage III (moderate) (HCC)   PAF (paroxysmal atrial fibrillation) (HCC)   Mitral regurgitation   Allergies Allergies  Allergen Reactions   Penicillins Hives    Has tolerated Keflex      Has patient had a PCN reaction causing immediate rash, facial/tongue/throat swelling, SOB or lightheadedness with hypotension: YES Has patient had a PCN reaction causing severe rash involving mucus membranes or skin necrosis: No Has patient had a PCN reaction that required hospitalization No Has patient had a PCN reaction occurring within the last 10 years: No If all of the above answers are "NO", then may proceed with Cephalosporin use.    Hydromorphone Hcl Nausea And Vomiting    Diagnostic Studies/Procedures      TAVR OPERATIVE NOTE     Date of Procedure:                04/12/2022   Preoperative Diagnosis:      Severe Aortic Stenosis    Postoperative Diagnosis:    Same    Procedure:        Transcatheter Aortic Valve Replacement - Percutaneous Right Transfemoral Approach             Edwards Sapien 3 Ultra Resilia THV (size 26 mm, model # 9755RSL, serial # [id number])              Co-Surgeons:                        Gaye Pollack, MD and Sherren Mocha, MD   Anesthesiologist:                  Tamela Gammon, MD   Echocardiographer:              Rudean Haskell, MD   Pre-operative Echo Findings: Severe aortic stenosis Normal left ventricular systolic function   Post-operative Echo Findings: No paravalvular leak Unchanged left ventricular systolic function _____________    Echo 04/13/22: completed but pending formal read at the time of discharge   History of Present Illness     FARZAD AVITABILE is a 88 y.o. male with a history of of severe MR with prolapse/flail leaflet, CAD s/p LAD and LCX stenting in 2014, paroxysmal atrial fibrillation on Eliquis, CHB s/p Medtronic PPM, aortic root dilation, ischemic CM with recovered EF, HTN, HLD, CKD stage IIIb, and severe aortic stenosis who presented to [facility] on 04/12/22 for planned TAVR.   He was hospitalized in 08/2018 with atypical chest pain. Troponins were flat and CTA was negative for PE. However, his EF was noted to be down - EF 30-35%, diffuse HK, moderate AS, mild MR, mod LAE, severely dilated RA, mildly dilated RV, mild TR, PASP 46,  elevated CVP. EF recovered to normal by echo in 01/2021 and aortic stenosis continued to progress.   Mr Hurd has remained remarkably active and functional. He continues to mow his yard and rides on a tractor and does a lot of work around his property. He recently developed increasing shortness of breath over the past several months as well as fatigue. He has to stop multiple times while walking his dog. TTE 01/24/22 showed EF 55%, severe AS with a mean grad 75mmHg, peak grad 58.72mmHg, AVA 1.11cm2, DVI 0.18, SVI 45 as well as severe MR with prolapse and possible flail segment of the posterior leaflet with anteriorly directed jet.  [facility] 01/27/22 showed CAD with ostial and proximal LCX stenosis at 60%, 2nd marginal lesion at 80%, pRCA and mRCA at 25%, with hemodynamic consistent with pulmonary HTN. Given the lack of angina, intervention was  deferred.   The patient has been evaluated by the multidisciplinary valve team and felt to have severe, symptomatic aortic stenosis and to be a suitable candidate for TAVR, which was set up for 04/12/22.   Hospital Course     Consultants: none   Severe AS: s/p successful TAVR with a 26 mm Edwards Sapien 3 Ultra Resilia THV via the TF approach on 04/12/22. An extra CC was added during deployment. Post operative echo completed but pending formal read (personally reviewed: normal EF and normally functioning TAVR with a mean gradient of 11 mm hg and no PVL as well as moderate-severe MR). Groin sites are stable. Resumed on home Eliquis. Plan for discharge home with close follow up in the office next week.   Severe mitral regurgitation: likely continues to be moderate to severe by exam and echo.   CAD: [facility] 01/27/22 showed CAD with ostial and proximal LCX stenosis at 60%, 2nd marginal lesion at 80%, pRCA and mRCA at 25%. Given the lack of angina, intervention was deferred. Continue medical therapy.   PAF: continue amiodarone and Eliquis 2.5mg  BID (appropriate dosing based on age and renal function).   HTN: BP a little elevated. Given PVCs on tele and CAD, will add Coreg 3.125mg  BID.   CHB s/p Medtronic PPM: followed by Dr. Rayann Heman   Hx of ischemic CM: EF now normalized. Continue Entresto and Lasix. Add Coreg 3.125mg  BID. He appears euvolemic.   CKD stage IIIb: creat stable around 1.5  Renal lesions: pre TAVR CTs showed indeterminate bilateral lower pole renal lesions which are new when compared with 2018 prior exam, recommend further evaluation with ultrasound. This will be discussed as an outpatient.     _____________  Discharge Vitals Blood pressure (!) 155/72, pulse 69, temperature 97.9 F (36.6 C), temperature source Axillary, resp. rate 18, height 5\' 8"  (1.727 m), weight 87.7 kg, SpO2 96 %.  Filed Weights   04/12/22 0856 04/13/22 0456  Weight: 87.8 kg 87.7 kg     GEN: Well  nourished, well developed, in no acute distress HEENT: normal Neck: no JVD or masses Cardiac: RRR; 2/6 holosystolic murmur. No rubs, or gallops,no edema  Respiratory:  clear to auscultation bilaterally, normal work of breathing GI: soft, nontender, nondistended, + BS MS: no deformity or atrophy Skin: warm and dry, no rash. Groin sites clear without hematoma or ecchymosis  Neuro:  Alert and Oriented x 3, Strength and sensation are intact Psych: euthymic mood, full affect   Labs & Radiologic Studies    CBC Recent Labs    04/12/22 1259 04/13/22 0153  WBC  --  6.4  HGB 10.9*  10.9*  HCT 32.0* 33.4*  MCV  --  99.4  PLT  --  [id number]   Basic Metabolic Panel Recent Labs    04/12/22 1259 04/13/22 0153  NA 140 135  K 4.7 4.6  CL 104 104  CO2  --  23  GLUCOSE 103* 89  BUN 19 21  CREATININE 1.60* 1.49*  CALCIUM  --  8.4*  MG  --  2.2   Liver Function Tests No results for input(s): "AST", "ALT", "ALKPHOS", "BILITOT", "PROT", "ALBUMIN" in the last 72 hours. No results for input(s): "LIPASE", "AMYLASE" in the last 72 hours. Cardiac Enzymes No results for input(s): "CKTOTAL", "CKMB", "CKMBINDEX", "TROPONINI" in the last 72 hours. BNP Invalid input(s): "POCBNP" D-Dimer No results for input(s): "DDIMER" in the last 72 hours. Hemoglobin A1C No results for input(s): "HGBA1C" in the last 72 hours. Fasting Lipid Panel No results for input(s): "CHOL", "HDL", "LDLCALC", "TRIG", "CHOLHDL", "LDLDIRECT" in the last 72 hours. Thyroid Function Tests No results for input(s): "TSH", "T4TOTAL", "T3FREE", "THYROIDAB" in the last 72 hours.  Invalid input(s): "FREET3" _____________  ECHOCARDIOGRAM LIMITED  Result Date: 04/12/2022    ECHOCARDIOGRAM LIMITED REPORT   Patient Name:   CRISTOFHER WEITKAMP Date of Exam: 04/12/2022 Medical Rec #:  [id number]      Height:       68.0 in Accession #:    [id number]     Weight:       193.5 lb Date of Birth:  [birth year]      BSA:          2.015 m Patient Age:    16  years       BP:           165/87 mmHg Patient Gender: M              HR:           76 bpm. Exam Location:  Inpatient Procedure: Limited Echo, Cardiac Doppler and Color Doppler Indications:    Post TAVR evaluation V43.3 / Z95.2  History:        Patient has prior history of Echocardiogram examinations. CAD,                 Pacemaker, Aortic Valve Disease and Mitral Valve Disease,                 Arrythmias:Atrial Fibrillation, II AV Block and PVC,                 Signs/Symptoms:Shortness of Breath; Risk Factors:Hypertension                 and Dyslipidemia. Chronic kidney disease.                 Aortic Valve: 26 mm Sapien prosthetic, stented (TAVR) valve is                 present in the aortic position. Procedure Date: 04/12/2022.  Sonographer:    Darlina Sicilian RDCS Referring Phys: [place]  1. Interventional TTE for TAVR placement.  2. Prior to procedure, severe aortic stenosis. No significant aortic regurgitation. Mean gradient 37 mm Hg. Peak gradient 57 mm Hg. DVI 0.19 AVA 0.68 cm2.  3. The aortic valve has been replaced by a 26 mm Sapien valve. Trivial paravalvular leak at the 3 o'clock position. Mean gradient 5 mm Hg, Peak Gradient 8 mm Hg, DVI 0.5, EOA 3.15 cm2.  4. Left ventricular ejection fraction, by estimation, is 55%.  The left ventricle has low normal function. The left ventricle has no regional wall motion abnormalities. There is moderate concentric left ventricular hypertrophy.  5. Right ventricular systolic function is normal. The right ventricular size is normal.  6. The mitral valve is degenerative. Moderate to severe mitral valve regurgitation.  7. Tricuspid valve regurgitation is mild to moderate. Comparison(s): Successful TAVR placement. FINDINGS  Left Ventricle: Left ventricular ejection fraction, by estimation, is 55%. The left ventricle has low normal function. The left ventricle has no regional wall motion abnormalities. The left ventricular internal cavity size was  normal in size. There is moderate concentric left ventricular hypertrophy. Right Ventricle: The right ventricular size is normal. Right ventricular systolic function is normal. Pericardium: There is no evidence of pericardial effusion. Mitral Valve: The mitral valve is degenerative in appearance. Moderate to severe mitral valve regurgitation. Tricuspid Valve: Tricuspid valve regurgitation is mild to moderate. Aortic Valve: The aortic valve has been repaired/replaced. Aortic valve mean gradient measures 20.5 mmHg. Aortic valve peak gradient measures 27.8 mmHg. Aortic valve area, by VTI measures 1.33 cm. There is a 26 mm Sapien prosthetic, stented (TAVR) valve  present in the aortic position. Procedure Date: 04/12/2022. Additional Comments: A device lead is visualized in the right ventricle and right atrium. LEFT VENTRICLE PLAX 2D LVOT diam:     2.60 cm LV SV:         91 LV SV Index:   45 LVOT Area:     5.31 cm  AORTIC VALVE AV Area (Vmax):    1.41 cm AV Area (Vmean):   1.34 cm AV Area (VTI):     1.33 cm AV Vmax:           263.50 cm/s AV Vmean:          196.500 cm/s AV VTI:            0.685 m AV Peak Grad:      27.8 mmHg AV Mean Grad:      20.5 mmHg LVOT Vmax:         69.90 cm/s LVOT Vmean:        49.600 cm/s LVOT VTI:          0.172 m LVOT/AV VTI ratio: 0.25  SHUNTS Systemic VTI:  0.17 m Systemic Diam: 2.60 cm Rudean Haskell MD Electronically signed by Rudean Haskell MD Signature Date/Time: 04/12/2022/5:01:04 PM    Final    Structural Heart Procedure  Result Date: 04/12/2022 See surgical note for result.  DG Chest 2 View  Result Date: 04/09/2022 CLINICAL DATA:  Preoperative evaluation for TAVR, aortic stenosis EXAM: CHEST - 2 VIEW COMPARISON:  07/12/2021 FINDINGS: Frontal and lateral views of the chest demonstrate a stable dual lead pacer. Borderline enlargement the cardiac silhouette unchanged. Continued ectasia and atherosclerosis of the thoracic aorta. Stable hiatal hernia. No airspace  disease, effusion, or pneumothorax. No acute bony abnormalities. IMPRESSION: 1. No acute intrathoracic process. 2. Stable hiatal hernia. Electronically Signed   By: Randa Ngo M.D.   On: 04/09/2022 16:36    Disposition   Pt is being discharged home today in good condition.  Follow-up Plans & Appointments     Follow-up Information     Eileen Stanford, PA-C. Go on 04/22/2022.   Specialties: Cardiology, Radiology Why: @ 1pm, please arrive at least 10 minutes early. Contact information: [address] [place] [address] [phone]  Discharge Medications   Allergies as of 04/13/2022       Reactions   Penicillins Hives   Has tolerated Keflex      Has patient had a PCN reaction causing immediate rash, facial/tongue/throat swelling, SOB or lightheadedness with hypotension: YES Has patient had a PCN reaction causing severe rash involving mucus membranes or skin necrosis: No Has patient had a PCN reaction that required hospitalization No Has patient had a PCN reaction occurring within the last 10 years: No If all of the above answers are "NO", then may proceed with Cephalosporin use.   Hydromorphone Hcl Nausea And Vomiting        Medication List     TAKE these medications    amiodarone 200 MG tablet Commonly known as: PACERONE TAKE 1 TABLET BY MOUTH DAILY   atorvastatin 10 MG tablet Commonly known as: LIPITOR TAKE 1 TABLET BY MOUTH  DAILY AT 6 PM.   carvedilol 3.125 MG tablet Commonly known as: Coreg Take 1 tablet (3.125 mg total) by mouth 2 (two) times daily.   Eliquis 2.5 MG Tabs tablet Generic drug: apixaban TAKE 1 TABLET BY MOUTH  TWICE DAILY   Entresto 24-26 MG Generic drug: sacubitril-valsartan TAKE 1 TABLET BY MOUTH  TWICE DAILY   Fish Oil 1000 MG Caps Take 1,000 mg by mouth daily.   furosemide 40 MG tablet Commonly known as: LASIX Take 1 tablet (40 mg total) by mouth daily.   latanoprost 0.005 %  ophthalmic solution Commonly known as: XALATAN Place 1 drop into the left eye at bedtime.   nitroGLYCERIN 0.4 MG SL tablet Commonly known as: NITROSTAT Place 0.4 mg under the tongue every 5 (five) minutes as needed for chest pain.   PRESERVISION AREDS 2+MULTI VIT PO Take 1 capsule by mouth in the morning and at bedtime.   timolol 0.5 % ophthalmic solution Commonly known as: TIMOPTIC Place 1 drop into the right eye 2 (two) times daily.          Outstanding Labs/Studies   none  Duration of Discharge Encounter   Greater than 30 minutes including physician time.  Mable Fill, PA-C 04/13/2022, 10:50 AM [phone]  Patient seen and examined with Nell Range PA-C.  Agree as above, with the following exceptions and changes as noted below. Feels great and wants to go home. Gen: NAD, CV: RRR, no systolic ejection murmur heard. There is a 3/6 holosystolic murmur along left precordium and apex, consistent with known MR. Lungs: clear, Abd: soft, Extrem: Warm, well perfused, no edema, Neuro/Psych: alert and oriented x 3, normal mood and affect. All available labs, radiology testing, previous records reviewed. Feels well overall, doing well POD1. Anticipating follow up June 23 with Nell Range. MR is known to be at least mod-severe, by exam today it is mod-severe and by echo at least moderate and eccentric. Will follow. Stable for hospital discharge, labs reviewed and stable. Echo reviewed - acceptable TAVR mean gradient, no PVL, mod severe MR.  Elouise Munroe, MD 04/13/22 11:26 AM

## 2022-04-13 NOTE — Progress Notes (Signed)
Order received to discharge patient.  Telemetry monitor removed and CCMD notified.  PIV access x2 removed.  Discharge instructions, follow up, medications and instructions for their use discussed with patient. 

## 2022-04-13 NOTE — Progress Notes (Signed)
CARDIAC REHAB PHASE I   PRE:  Rate/Rhythm: 66 SR  BP:  Sitting: 137/69      SaO2: 98 RA  MODE:  Ambulation: 100 ft   POST:  Rate/Rhythm: 91 SR  BP:  Sitting: 172/94    SaO2: 96 RA   Pt agreeable to ambulation. Pt ambulated 150ft in hallway assist of one with steady gait. Pt denies CP, SOB, or dizziness throughout. Pt assisted to BR than back to EOB. Reviewed site care, restrictions, and exercise guidelines. Pt declines CRP II. Anxious to d/c.  [id number] Reynold Bowen, RN BSN 04/13/2022 10:07 AM

## 2022-04-13 NOTE — TOC Transition Note (Signed)
Transition of Care (TOC) - CM/SW Discharge Note Donn Pierini RN, BSN Transitions of Care Unit 4E- RN Case Manager See Treatment Team for direct phone #    Patient Details  Name: Gregory Mccormick MRN: [id number] Date of Birth: 1929/08/26  Transition of Care [facility] CM/SW Contact:  Darrold Span, RN Phone Number: 04/13/2022, 2:39 PM   Clinical Narrative:    Pt from home s/p TAVR, anticipate return home today pending ECHO results. Transition of Care Department [facility]) has reviewed patient and no TOC needs have been identified at this time.   Final next level of care: Home/Self Care Barriers to Discharge: No Barriers Identified   Patient Goals and CMS Choice     Choice offered to / list presented to : NA  Discharge Placement                 Home      Discharge Plan and Services     Post Acute Care Choice: NA                               Social Determinants of Health (SDOH) Interventions     Readmission Risk Interventions    04/13/2022    2:39 PM  Readmission Risk Prevention Plan  Post Dischage Appt Complete  Medication Screening Complete  Transportation Screening Complete

## 2022-04-14 ENCOUNTER — Telehealth: Payer: Self-pay | Admitting: Physician Assistant

## 2022-04-14 MED FILL — Potassium Chloride Inj 2 mEq/ML: INTRAVENOUS | Qty: 40 | Status: AC

## 2022-04-14 MED FILL — Magnesium Sulfate Inj 50%: INTRAMUSCULAR | Qty: 10 | Status: AC

## 2022-04-14 MED FILL — Heparin Sodium (Porcine) Inj 1000 Unit/ML: Qty: 1000 | Status: AC

## 2022-04-14 NOTE — Telephone Encounter (Signed)
  [facility]   MULTIDISCIPLINARY HEART VALVE TEAM   Patient contacted regarding discharge from [facility] on 6/14  Patient understands to follow up with a structural heart APP on 6/23 at [address]  Patient understands discharge instructions? yes Patient understands medications and regimen? yes Patient understands to bring all medications to this visit? yes  Carmalita Wakefield PA-C  [facility]     

## 2022-04-19 ENCOUNTER — Ambulatory Visit (INDEPENDENT_AMBULATORY_CARE_PROVIDER_SITE_OTHER): Payer: Medicare Other

## 2022-04-19 DIAGNOSIS — I5032 Chronic diastolic (congestive) heart failure: Secondary | ICD-10-CM | POA: Diagnosis not present

## 2022-04-19 LAB — CUP PACEART REMOTE DEVICE CHECK
Battery Impedance: 2403 Ohm
Battery Remaining Longevity: 29 mo
Battery Voltage: 2.73 V
Brady Statistic AP VP Percent: 0 %
Brady Statistic AP VS Percent: 89 %
Brady Statistic AS VP Percent: 0 %
Brady Statistic AS VS Percent: 11 %
Date Time Interrogation Session: 20230619104354
Implantable Lead Implant Date: 20111206
Implantable Lead Implant Date: 20111206
Implantable Lead Location: 753859
Implantable Lead Location: 753860
Implantable Lead Model: 5076
Implantable Lead Model: 5092
Implantable Pulse Generator Implant Date: 20111206
Lead Channel Impedance Value: 481 Ohm
Lead Channel Impedance Value: 781 Ohm
Lead Channel Pacing Threshold Amplitude: 1 V
Lead Channel Pacing Threshold Amplitude: 1.125 V
Lead Channel Pacing Threshold Pulse Width: 0.4 ms
Lead Channel Pacing Threshold Pulse Width: 0.4 ms
Lead Channel Setting Pacing Amplitude: 2.25 V
Lead Channel Setting Pacing Amplitude: 2.5 V
Lead Channel Setting Pacing Pulse Width: 0.4 ms
Lead Channel Setting Sensing Sensitivity: 5.6 mV

## 2022-04-22 ENCOUNTER — Ambulatory Visit (INDEPENDENT_AMBULATORY_CARE_PROVIDER_SITE_OTHER): Payer: Medicare Other | Admitting: Physician Assistant

## 2022-04-22 VITALS — BP 104/60 | HR 74 | Ht 69.0 in | Wt 194.0 lb

## 2022-04-22 DIAGNOSIS — I10 Essential (primary) hypertension: Secondary | ICD-10-CM

## 2022-04-22 DIAGNOSIS — I480 Paroxysmal atrial fibrillation: Secondary | ICD-10-CM

## 2022-04-22 DIAGNOSIS — N289 Disorder of kidney and ureter, unspecified: Secondary | ICD-10-CM

## 2022-04-22 DIAGNOSIS — N1832 Chronic kidney disease, stage 3b: Secondary | ICD-10-CM

## 2022-04-22 DIAGNOSIS — Z952 Presence of prosthetic heart valve: Secondary | ICD-10-CM

## 2022-04-22 DIAGNOSIS — I340 Nonrheumatic mitral (valve) insufficiency: Secondary | ICD-10-CM

## 2022-04-22 DIAGNOSIS — I255 Ischemic cardiomyopathy: Secondary | ICD-10-CM

## 2022-04-22 DIAGNOSIS — I2510 Atherosclerotic heart disease of native coronary artery without angina pectoris: Secondary | ICD-10-CM

## 2022-04-22 MED ORDER — AZITHROMYCIN 500 MG PO TABS: 500.0000 mg | ORAL_TABLET | ORAL | 12 refills | Status: DC

## 2022-04-27 ENCOUNTER — Telehealth: Payer: Self-pay

## 2022-04-27 MED ORDER — CEPHALEXIN 500 MG PO CAPS: ORAL_CAPSULE | ORAL | 1 refills | Status: DC

## 2022-04-27 NOTE — Telephone Encounter (Signed)
[facility] mail order pharmacy requesting clarification stating that Amiodarone 200 mg tablet may interact with Azithromycin 500 mg tablet which can cause risk of QT prolongation and torsade de pointes. [facility] wanted to know if provider is aware of the warnings and wish to continue current treatment. Ph# [phone]  order# [id number]  Please address

## 2022-04-27 NOTE — Telephone Encounter (Signed)
I called and spoke with patient as his chart says he has tolerated keflex. Looks like he has received a cephalosporin multiple times. I will change azithro to keflex 2000mg  30-60 min prior to dental appointment. Pt requests that it be sent to [facility] on [address].   I [facility] and canceled rx for azithromycin.

## 2022-05-04 NOTE — Progress Notes (Signed)
Remote pacemaker transmission.   

## 2022-05-14 ENCOUNTER — Other Ambulatory Visit: Payer: Self-pay | Admitting: Internal Medicine

## 2022-05-14 DIAGNOSIS — I4821 Permanent atrial fibrillation: Secondary | ICD-10-CM

## 2022-05-16 NOTE — Telephone Encounter (Addendum)
Prescription refill request for Eliquis received. Indication: Afib  Last office visit: 04/22/22 Janee Morn)  Scr: 1.49 (04/13/22) Age: 88 Weight: 88kg  Per Phillips Hay, pt should remain on 2.5mg  BID. Appropriate dose and refill sent to requested pharmacy

## 2022-05-18 ENCOUNTER — Encounter (HOSPITAL_COMMUNITY): Payer: Self-pay | Admitting: Cardiovascular Disease

## 2022-05-20 ENCOUNTER — Ambulatory Visit (INDEPENDENT_AMBULATORY_CARE_PROVIDER_SITE_OTHER): Payer: Medicare Other | Admitting: Physician Assistant

## 2022-05-20 ENCOUNTER — Ambulatory Visit (HOSPITAL_COMMUNITY): Payer: Medicare Other | Attending: Cardiology

## 2022-05-20 VITALS — BP 112/72 | HR 77 | Ht 68.0 in | Wt 193.0 lb

## 2022-05-20 DIAGNOSIS — Z950 Presence of cardiac pacemaker: Secondary | ICD-10-CM

## 2022-05-20 DIAGNOSIS — I2510 Atherosclerotic heart disease of native coronary artery without angina pectoris: Secondary | ICD-10-CM | POA: Diagnosis not present

## 2022-05-20 DIAGNOSIS — I10 Essential (primary) hypertension: Secondary | ICD-10-CM

## 2022-05-20 DIAGNOSIS — Z952 Presence of prosthetic heart valve: Secondary | ICD-10-CM | POA: Insufficient documentation

## 2022-05-20 DIAGNOSIS — N289 Disorder of kidney and ureter, unspecified: Secondary | ICD-10-CM

## 2022-05-20 DIAGNOSIS — N1832 Chronic kidney disease, stage 3b: Secondary | ICD-10-CM

## 2022-05-20 DIAGNOSIS — I255 Ischemic cardiomyopathy: Secondary | ICD-10-CM

## 2022-05-20 DIAGNOSIS — I480 Paroxysmal atrial fibrillation: Secondary | ICD-10-CM

## 2022-05-20 DIAGNOSIS — I340 Nonrheumatic mitral (valve) insufficiency: Secondary | ICD-10-CM

## 2022-05-20 LAB — ECHOCARDIOGRAM COMPLETE
AR max vel: 2.14 cm2
AV Area VTI: 2.28 cm2
AV Area mean vel: 2.04 cm2
AV Mean grad: 8 mmHg
AV Peak grad: 10.5 mmHg
Ao pk vel: 1.62 m/s
Area-P 1/2: 2.48 cm2
S' Lateral: 2.7 cm

## 2022-05-20 NOTE — Progress Notes (Unsigned)
HE[facility] AND [place]                                     Cardiology Office Note:    Date:  05/21/2022   ID:  MEIR JENTSCH, DOB June 28, 1929, MRN [id number]  PCP:  Antony Contras, MD  [facility] Cardiologist:  Larae Grooms, MD  / Dr. Burt Knack & Dr. Cyndia Bent (TAVR) [facility] Electrophysiologist:  Beckem Tomberlin Grayer, MD   Referring MD: Antony Contras, MD   1 month s/p TAVR  History of Present Illness:    Gregory Mccormick is a 88 y.o. male with a hx of severe MR with prolapse/flail leaflet, CAD s/p LAD and LCX stenting in 2014, paroxysmal atrial fibrillation on Eliquis, CHB s/p Medtronic PPM, aortic root dilation, ischemic CM with recovered EF, HTN, HLD, CKD stage IIIb, and severe aortic stenosis s/p TAVR (04/12/22) who presents to clinic for follow up.    He was hospitalized in 08/2018 with atypical chest pain. Troponins were flat and CTA was negative for PE. However, his EF was noted to be down - EF 30-35%, diffuse HK, moderate AS, mild MR, mod LAE, severely dilated RA, mildly dilated RV, mild TR, PASP 46, elevated CVP. EF recovered to normal by echo in 01/2021 and aortic stenosis continued to progress.    Mr Sitzer has remained remarkably active and functional. He continues to mow his yard and rides on a tractor and does a lot of work around his property. He recently developed increasing shortness of breath over the past several months as well as fatigue. He has to stop multiple times while walking his dog. TTE 01/24/22 showed EF 55%, severe AS with a mean grad 101mmHg, peak grad 58.75mmHg, AVA 1.11cm2, DVI 0.18, SVI 45 as well as severe MR with prolapse and possible flail segment of the posterior leaflet with anteriorly directed jet.  [facility] 01/27/22 showed CAD with ostial and proximal LCX stenosis at 60%, 2nd marginal lesion at 80%, pRCA and mRCA at 25%, with hemodynamic consistent with pulmonary HTN. Given the lack of angina, intervention was deferred.    He was evaluated by the multidisciplinary valve team and underwent successful TAVR with a 26 mm Edwards Sapien 3 Ultra Resilia THV via the TF approach on 04/12/22. An extra CC was added during deployment. Post operative echo showed EF 55-60%, normally functioning TAVR with a mean gradient of 11 mmHg and no PVL. He was discharged on home Eliquis.   Today the patient presents to clinic for follow up. No CP or SOB. No LE edema, orthopnea or PND. No dizziness or syncope. No blood in stool or urine. No palpitations.   Past Medical History:  Diagnosis Date   Anemia, unspecified    Arthritis    "right knee" (03/28/2013   CAD in native artery    Cardiomyopathy ([place])    EF 40%:  ECHO 11/25/2015 EF 55%-60%   Carpal tunnel syndrome, bilateral    Chronic diastolic (congestive) heart failure (HCC)    Chronic kidney disease (CKD), stage III (moderate) (HCC)    Epistaxis    Essential hypertension, benign    takes Metoprolol daily   H/O hiatal hernia    History of kidney stones    Hyperlipidemia    takes Lipitor daily   Mitral regurgitation    OSA on CPAP    wears CPAP 02/03/16   Pacemaker  MDT   PAF (paroxysmal atrial fibrillation) (HCC)    Paroxysmal ventricular tachycardia (HCC)    PONV (postoperative nausea and vomiting)    PVC's (premature ventricular contractions)    S/P TAVR (transcatheter aortic valve replacement) 04/12/2022   s/p TAVR with a 36mm Melodye Ped (with an extra CC) via the TF approach by Dr. Excell Seltzer & Laneta Simmers.   Severe aortic stenosis    Vitamin D deficiency     Past Surgical History:  Procedure Laterality Date   CARDIAC CATHETERIZATION  03/21/2013   CARDIOVERSION N/A 10/09/2018   Procedure: CARDIOVERSION;  Surgeon: Wendall Stade, MD;  Location: [facility] ENDOSCOPY;  Service: Cardiovascular;  Laterality: N/A;   CARPAL TUNNEL RELEASE Right 02/05/2016   Procedure: RIGHT LIMITED OPEN CARPAL TUNNEL RELEASE;  Surgeon: Dominica Severin, MD;  Location: [facility] OR;  Service: Orthopedics;   Laterality: Right;   CARPAL TUNNEL RELEASE Left 04/21/2016   Procedure: CARPAL TUNNEL RELEASE;  Surgeon: Dominica Severin, MD;  Location: [facility] OR;  Service: Orthopedics;  Laterality: Left;   CATARACT EXTRACTION W/ INTRAOCULAR LENS  IMPLANT, BILATERAL Bilateral 2000's   COLONOSCOPY     CORONARY ANGIOPLASTY WITH STENT PLACEMENT  03/28/2013   "3 stents" (03/28/2013)   HIP ARTHROPLASTY Right 04/30/2019   Procedure: ARTHROPLASTY BIPOLAR HIP (HEMIARTHROPLASTY);  Surgeon: Durene Romans, MD;  Location: [facility] ORS;  Service: Orthopedics;  Laterality: Right;   INSERT / REPLACE / REMOVE PACEMAKER     INTRAOPERATIVE TRANSTHORACIC ECHOCARDIOGRAM N/A 04/12/2022   Procedure: INTRAOPERATIVE TRANSTHORACIC ECHOCARDIOGRAM;  Surgeon: Tonny Bollman, MD;  Location: [facility] OR;  Service: Open Heart Surgery;  Laterality: N/A;   KNEE CARTILAGE SURGERY Left 1972   "cut it open" (03/28/2013)   KNEE CARTILAGE SURGERY Right ~ 1977   "cut it open" (03/28/2013)   PACEMAKER INSERTION  10/05/10   MDT Adapta L implanted by Dr [name] for mobitz II second degree AV block   PERCUTANEOUS CORONARY ROTOBLATOR INTERVENTION (PCI-R) N/A 03/28/2013   Procedure: PERCUTANEOUS CORONARY ROTOBLATOR INTERVENTION (PCI-R);  Surgeon: Corky Crafts, MD;  Location: [facility] CATH LAB;  Service: Cardiovascular;  Laterality: N/A;   RIGHT/LEFT HEART CATH AND CORONARY ANGIOGRAPHY N/A 01/27/2022   Procedure: RIGHT/LEFT HEART CATH AND CORONARY ANGIOGRAPHY;  Surgeon: Corky Crafts, MD;  Location: [facility] INVASIVE CV LAB;  Service: Cardiovascular;  Laterality: N/A;   SEPTOPLASTY N/A 02/05/2016   Procedure: SEPTOPLASTY WITH INTRANASAL SKIN GRAFT;  Surgeon: Drema Halon, MD;  Location: [facility] OR;  Service: ENT;  Laterality: N/A;   SHOULDER HEMI-ARTHROPLASTY Right 1987   "got hit by sled & dragged down sheet > 200 feet; tore shoulder up" (03/28/2013)   SHOULDER SURGERY Right 1970's   "pulled muscle apart; sewed it back together" (03/28/2013   TOTAL KNEE ARTHROPLASTY Right  2007   TOTAL KNEE ARTHROPLASTY  11/26/2012   Procedure: TOTAL KNEE ARTHROPLASTY;  Surgeon: Loanne Drilling, MD;  Location: [facility] ORS;  Service: Orthopedics;  Laterality: Left;   TRANSCATHETER AORTIC VALVE REPLACEMENT, TRANSFEMORAL N/A 04/12/2022   Procedure: Transcatheter Aortic Valve Replacement, Transfemoral;  Surgeon: Tonny Bollman, MD;  Location: [facility] OR;  Service: Open Heart Surgery;  Laterality: N/A;   TRANSURETHRAL RESECTION OF PROSTATE N/A 12/27/2017   Procedure: TRANSURETHRAL RESECTION OF THE PROSTATE (TURP);  Surgeon: Crista Elliot, MD;  Location: [facility];  Service: Urology;  Laterality: N/A;    Current Medications: Current Meds  Medication Sig   amiodarone (PACERONE) 200 MG tablet TAKE 1 TABLET BY MOUTH DAILY   atorvastatin (LIPITOR) 10 MG tablet TAKE  1 TABLET BY MOUTH  DAILY AT 6 PM.   carvedilol (COREG) 3.125 MG tablet Take 1 tablet (3.125 mg total) by mouth 2 (two) times daily.   cephALEXin (KEFLEX) 500 MG capsule Take 4 capsules by mouth 30-60 min prior to dental appointment.   ELIQUIS 2.5 MG TABS tablet TAKE 1 TABLET BY MOUTH TWICE  DAILY   ENTRESTO 24-26 MG TAKE 1 TABLET BY MOUTH  TWICE DAILY   furosemide (LASIX) 40 MG tablet Take 1 tablet (40 mg total) by mouth daily.   latanoprost (XALATAN) 0.005 % ophthalmic solution Place 1 drop into the left eye at bedtime.   Multiple Vitamins-Minerals (PRESERVISION AREDS 2+MULTI VIT PO) Take 1 capsule by mouth in the morning and at bedtime.   nitroGLYCERIN (NITROSTAT) 0.4 MG SL tablet Place 0.4 mg under the tongue every 5 (five) minutes as needed for chest pain.   Omega-3 Fatty Acids (FISH OIL) 1000 MG CAPS Take 1,000 mg by mouth daily.   timolol (TIMOPTIC) 0.5 % ophthalmic solution Place 1 drop into the right eye 2 (two) times daily.   Current Facility-Administered Medications for the 05/20/22 encounter (Office Visit) with [facility]  Medication   betamethasone acetate-betamethasone sodium  phosphate (CELESTONE) injection 3 mg     Allergies:   Penicillins and Hydromorphone hcl   Social History   Socioeconomic History   Marital status: Widowed    Spouse name: Not on file   Number of children: 3   Years of education: Not on file   Highest education level: Not on file  Occupational History   Occupation: retired    Fish farm manager: [employer]  Tobacco Use   Smoking status: Former    Packs/day: 1.00    Years: 26.00    Total pack years: 26.00    Types: Cigarettes    Quit date: 10/31/1973    Years since quitting: 48.5   Smokeless tobacco: Never  Vaping Use   Vaping Use: Never used  Substance and Sexual Activity   Alcohol use: No   Drug use: No   Sexual activity: Not Currently  Other Topics Concern   Not on file  Social History Narrative   Not on file   Social Determinants of Health   Financial Resource Strain: Not on file  Food Insecurity: Not on file  Transportation Needs: Not on file  Physical Activity: Not on file  Stress: Not on file  Social Connections: Not on file     Family History: The patient's family history includes CAD (age of onset: 42) in his brother; Cancer in an other family member; Heart disease in an other family member; Stroke in his brother. There is no history of Heart attack or Hypertension.  ROS:   Please see the history of present illness.    All other systems reviewed and are negative.  EKGs/Labs/Other Studies Reviewed:    The following studies were reviewed today:   TAVR OPERATIVE NOTE     Date of Procedure:                04/12/2022   Preoperative Diagnosis:      Severe Aortic Stenosis    Postoperative Diagnosis:    Same    Procedure:        Transcatheter Aortic Valve Replacement - Percutaneous Right Transfemoral Approach             Edwards Sapien 3 Ultra Resilia THV (size 26 mm, model # 9755RSL, serial # [id number])  Co-Surgeons:                        Gaye Pollack, MD and Sherren Mocha, MD    Anesthesiologist:                  Tamela Gammon, MD   Echocardiographer:              Rudean Haskell, MD   Pre-operative Echo Findings: Severe aortic stenosis Normal left ventricular systolic function   Post-operative Echo Findings: No paravalvular leak Unchanged left ventricular systolic function _____________     Echo 04/13/22:  IMPRESSIONS   1. The aortic valve has been replaced by a 26 mm Edwards Sapien Valve.  Aortic valve regurgitation is not visualized (neither central no PVL).  Effective orifice area, by VTI measures 2.02 cm. Aortic valve mean  gradient measures 11.0 mmHg. Peak gradient  20 mm Hg. DVI 0.4.   2. Left ventricular ejection fraction, by estimation, is 55 to 60%. The  left ventricle has normal function. The left ventricle has no regional  wall motion abnormalities. There is mild concentric left ventricular  hypertrophy. Left ventricular diastolic  parameters are indeterminate.   3. The mitral valve is degenerative. Moderate to severe mitral valve  regurgitation, without full assessment. There is splay artifact. Prior  studies have shown prolapse; this was not well demonstrated in this study.   4. Right ventricular systolic function is normal. The right ventricular  size is normal. There is severely elevated pulmonary artery systolic  pressure. The estimated right ventricular systolic pressure is [id number] mmHg.   5. Tricuspid valve regurgitation is mild to moderate.   6. The inferior vena cava is dilated in size with <50% respiratory  variability, suggesting right atrial pressure of 15 mmHg.   Comparison(s): Echo findings are consistent with normal structure and  function of the aortic valve prosthesis. Mitral regurgitation is at least  moderate to severe.   ____________________  Echo 05/20/22 IMPRESSIONS  1. Left ventricular ejection fraction, by estimation, is 55%. The left  ventricle has normal function. The left ventricle has no regional wall   motion abnormalities. There is mild concentric left ventricular  hypertrophy. Left ventricular diastolic  parameters are indeterminate.   2. Right ventricular systolic function is normal. The right ventricular  size is normal. There is normal pulmonary artery systolic pressure. The  estimated right ventricular systolic pressure is [id number] mmHg.   3. Left atrial size was moderately dilated.   4. Right atrial size was mildly dilated.   5. The mitral valve is degenerative. Suspect moderate mitral valve  regurgitation, eccentric with horizontal color splay. No evidence of  mitral stenosis.   6. Tricuspid valve regurgitation is mild to moderate.   7. The aortic valve has been replaced by a 26 mm Edwards Sapien Valve.  Aortic valve regurgitation is not visualized. Effective orifice area, by  VTI measures 2.28 cm. Aortic valve mean gradient measures 8.0 mmHg. DVI  0.63. Peak gradient 15 mm Hg.   8. Echo findings are consistent with normal structure and function of the  aortic valve prosthesis.   Comparison(s): Stable prosthetic aortic findings. Largely similar mitral regurgitation findings.   EKG:  EKG is NOT ordered today.    Recent Labs: 07/12/2021: TSH 4.250 01/11/2022: NT-Pro BNP 636 04/08/2022: ALT 12 04/13/2022: BUN 21; Creatinine, Ser 1.49; Hemoglobin 10.9; Magnesium 2.2; Platelets 136; Potassium 4.6; Sodium 135  Recent Lipid Panel    Component Value Date/Time  CHOL 108 04/30/2019 0518   CHOL 108 11/17/2017 0848   TRIG 29 04/30/2019 0518   HDL 52 04/30/2019 0518   HDL 56 11/17/2017 0848   CHOLHDL 2.1 04/30/2019 0518   VLDL 6 04/30/2019 0518   LDLCALC 50 04/30/2019 0518   LDLCALC 39 11/17/2017 0848     Risk Assessment/Calculations:    CHA2DS2-VASc Score = 5   This indicates a 7.2% annual risk of stroke. The patient's score is based upon: CHF History: 1 HTN History: 1 Diabetes History: 0 Stroke History: 0 Vascular Disease History: 1 Age Score: 2 Gender Score: 0       Physical Exam:    VS:  BP 112/72   Pulse 77   Ht 5\' 8"  (1.727 m)   Wt 193 lb (87.5 kg)   SpO2 97%   BMI 29.35 kg/m     Wt Readings from Last 3 Encounters:  05/20/22 193 lb (87.5 kg)  04/22/22 194 lb (88 kg)  04/13/22 193 lb 4.8 oz (87.7 kg)     GEN:  Well nourished, well developed in no acute distress HEENT: Normal NECK: No JVD LYMPHATICS: No lymphadenopathy CARDIAC: RRR, 2/6 holosystolic murmur at apex. No rubs or gallops RESPIRATORY:  Clear to auscultation without rales, wheezing or rhonchi  ABDOMEN: Soft, non-tender, non-distended MUSCULOSKELETAL:  No edema; No deformity  SKIN: Warm and dry.   NEUROLOGIC:  Alert and oriented x 3 PSYCHIATRIC:  Normal affect   ASSESSMENT:    1. S/P TAVR (transcatheter aortic valve replacement)   2. Nonrheumatic mitral valve regurgitation   3. Coronary artery disease involving native coronary artery of native heart without angina pectoris   4. PAF (paroxysmal atrial fibrillation) ([place])   5. Essential hypertension   6. Pacemaker   7. Ischemic cardiomyopathy   8. Stage 3b chronic kidney disease ([place])   9. Renal lesion     PLAN:    In order of problems listed above:  Severe AS s/p TAVR: echo today shows EF 55%, normally functioning TAVR with a mean gradient of 8 mm hg and no PVL. He has NYHA class I symptoms. I have RX'd azithromycin due to a PCN allergy. Continue on home Eliquis 5mg  BID. We will see him back next year for follow up and echo.   Severe mitral regurgitation: moderate to severe by echo today. He is clinically doing quite well. If this changes, he could potentially be a clip candidate in the future.     CAD: [facility] 01/27/22 showed CAD with ostial and proximal LCX stenosis at 60%, 2nd marginal lesion at 80%, pRCA and mRCA at 25%. Given the lack of angina, intervention was deferred. Continue medical therapy.    PAF: continue amiodarone and Eliquis 2.5mg  BID (appropriate dosing based on age and renal function).     HTN: BP well controlled today. No changes made   CHB s/p Medtronic PPM: followed by Dr. Rayann Heman    Hx of ischemic CM: EF now normalized. Continue Entresto and Coreg 3.125mg  BID. He appears euvolemic.    CKD stage IIIb: creat stable around 1.5   Renal lesions: pre TAVR CTs showed indeterminate bilateral lower pole renal lesions which are new when compared with 2018 prior exam, recommend further evaluation with ultrasound. Will get this set up    Medication Adjustments/Labs and Tests Ordered: Current medicines are reviewed at length with the patient today.  Concerns regarding medicines are outlined above.  Orders Placed This Encounter  Procedures   US Renal   ECHOCARDIOGRAM COMPLETE  No orders of the defined types were placed in this encounter.   Patient Instructions  Medication Instructions:  Your physician recommends that you continue on your current medications as directed. Please refer to the Current Medication list given to you today.    *If you need a refill on your cardiac medications before your next appointment, please call your pharmacy*   Lab Work: None ordered   If you have labs (blood work) drawn today and your tests are completely normal, you will receive your results only by: MyChart Message (if you have MyChart) OR A paper copy in the mail If you have any lab test that is abnormal or we need to change your treatment, we will call you to review the results.   Testing/Procedures: Your physician has requested that you have an echocardiogram. Echocardiography is a painless test that uses sound waves to create images of your heart. It provides your doctor with information about the size and shape of your heart and how well your heart's chambers and valves are working. This procedure takes approximately one hour. There are no restrictions for this procedure.    Follow-Up: Follow up as scheduled     Other Instructions   Important Information About  Sugar         Signed, Cline Crock, PA-C  05/21/2022 8:47 AM     [facility]

## 2022-05-20 NOTE — Patient Instructions (Signed)
Medication Instructions:  Your physician recommends that you continue on your current medications as directed. Please refer to the Current Medication list given to you today.  *If you need a refill on your cardiac medications before your next appointment, please call your pharmacy*   Lab Work: None ordered   If you have labs (blood work) drawn today and your tests are completely normal, you will receive your results only by: MyChart Message (if you have MyChart) OR A paper copy in the mail If you have any lab test that is abnormal or we need to change your treatment, we will call you to review the results.   Testing/Procedures: Your physician has requested that you have an echocardiogram. Echocardiography is a painless test that uses sound waves to create images of your heart. It provides your doctor with information about the size and shape of your heart and how well your heart's chambers and valves are working. This procedure takes approximately one hour. There are no restrictions for this procedure.    Follow-Up: Follow up as scheduled    Other Instructions   Important Information About Sugar       

## 2022-06-23 ENCOUNTER — Ambulatory Visit (HOSPITAL_COMMUNITY)
Admission: RE | Admit: 2022-06-23 | Discharge: 2022-06-23 | Disposition: A | Discharge status: Home / Self Care | Payer: Medicare Other | Source: Ambulatory Visit | Attending: Physician Assistant | Admitting: Physician Assistant

## 2022-06-23 DIAGNOSIS — I5032 Chronic diastolic (congestive) heart failure: Secondary | ICD-10-CM | POA: Insufficient documentation

## 2022-06-23 DIAGNOSIS — N289 Disorder of kidney and ureter, unspecified: Secondary | ICD-10-CM | POA: Insufficient documentation

## 2022-06-23 DIAGNOSIS — I130 Hypertensive heart and chronic kidney disease with heart failure and stage 1 through stage 4 chronic kidney disease, or unspecified chronic kidney disease: Secondary | ICD-10-CM | POA: Diagnosis not present

## 2022-06-23 DIAGNOSIS — N1832 Chronic kidney disease, stage 3b: Secondary | ICD-10-CM | POA: Insufficient documentation

## 2022-07-01 ENCOUNTER — Emergency Department (HOSPITAL_COMMUNITY): Payer: Medicare Other

## 2022-07-01 ENCOUNTER — Encounter (HOSPITAL_COMMUNITY): Payer: Self-pay

## 2022-07-01 ENCOUNTER — Emergency Department (HOSPITAL_COMMUNITY)
Admission: EM | Admit: 2022-07-01 | Discharge: 2022-07-01 | Disposition: A | Discharge status: Home / Self Care | Payer: Medicare Other | Attending: Emergency Medicine | Admitting: Emergency Medicine

## 2022-07-01 DIAGNOSIS — R61 Generalized hyperhidrosis: Secondary | ICD-10-CM | POA: Insufficient documentation

## 2022-07-01 DIAGNOSIS — R0602 Shortness of breath: Secondary | ICD-10-CM | POA: Insufficient documentation

## 2022-07-01 DIAGNOSIS — I350 Nonrheumatic aortic (valve) stenosis: Secondary | ICD-10-CM

## 2022-07-01 DIAGNOSIS — Z7901 Long term (current) use of anticoagulants: Secondary | ICD-10-CM | POA: Insufficient documentation

## 2022-07-01 DIAGNOSIS — R110 Nausea: Secondary | ICD-10-CM | POA: Diagnosis not present

## 2022-07-01 DIAGNOSIS — R42 Dizziness and giddiness: Secondary | ICD-10-CM | POA: Diagnosis not present

## 2022-07-01 DIAGNOSIS — R079 Chest pain, unspecified: Secondary | ICD-10-CM | POA: Diagnosis not present

## 2022-07-01 LAB — BASIC METABOLIC PANEL
Anion gap: 14 (ref 5–15)
BUN: 31 mg/dL — ABNORMAL HIGH (ref 8–23)
CO2: 25 mmol/L (ref 22–32)
Calcium: 9 mg/dL (ref 8.9–10.3)
Chloride: 103 mmol/L (ref 98–111)
Creatinine, Ser: 2.01 mg/dL — ABNORMAL HIGH (ref 0.61–1.24)
GFR, Estimated: 31 mL/min — ABNORMAL LOW (ref >60)
Glucose, Bld: 89 mg/dL (ref 70–99)
Potassium: 4.3 mmol/L (ref 3.5–5.1)
Sodium: 142 mmol/L (ref 135–145)

## 2022-07-01 LAB — CBC
HCT: 42.8 % (ref 39.0–52.0)
Hemoglobin: 13.6 g/dL (ref 13.0–17.0)
MCH: 31.3 pg (ref 26.0–34.0)
MCHC: 31.8 g/dL (ref 30.0–36.0)
MCV: 98.6 fL (ref 80.0–100.0)
Platelets: 127 10*3/uL — ABNORMAL LOW (ref 150–400)
RBC: 4.34 MIL/uL (ref 4.22–5.81)
RDW: 13.6 % (ref 11.5–15.5)
WBC: 10.1 10*3/uL (ref 4.0–10.5)
nRBC: 0 % (ref 0.0–0.2)

## 2022-07-01 LAB — TROPONIN I (HIGH SENSITIVITY)
Troponin I (High Sensitivity): 13 ng/L (ref <18)
Troponin I (High Sensitivity): 14 ng/L (ref <18)

## 2022-07-01 MED ORDER — ISOSORBIDE MONONITRATE ER 30 MG PO TB24
30.0000 mg | ORAL_TABLET | Freq: Every day | ORAL | Status: DC
  Administered 2022-07-01: 30 mg via ORAL
  Filled 2022-07-01: qty 1

## 2022-07-01 MED ORDER — ISOSORBIDE MONONITRATE ER 30 MG PO TB24: 30.0000 mg | ORAL_TABLET | Freq: Every day | ORAL | 0 refills | Status: DC

## 2022-07-01 MED ORDER — CARVEDILOL 12.5 MG PO TABS: 12.5000 mg | ORAL_TABLET | Freq: Two times a day (BID) | ORAL | 0 refills | Status: DC

## 2022-07-01 MED ORDER — CARVEDILOL 12.5 MG PO TABS
12.5000 mg | ORAL_TABLET | Freq: Two times a day (BID) | ORAL | Status: DC
Start: 2022-07-01 — End: 2022-07-02
  Administered 2022-07-01: 12.5 mg via ORAL
  Filled 2022-07-01: qty 1

## 2022-07-01 MED ORDER — ISOSORBIDE MONONITRATE ER 30 MG PO TB24: 30.0000 mg | ORAL_TABLET | Freq: Every day | ORAL | Status: DC

## 2022-07-01 NOTE — ED Provider Notes (Signed)
Patient's pacemaker was interrogated and he had no events today associated with his symptoms.  Last event was on 06/18/2022.  Cardiology evaluated the patient and based on their notes at this time they feel the patient can go home with cardiology follow-up in the office with starting Imdur 30 mg at night and increasing Coreg to 12.5 twice daily.   Gwyneth Sprout, MD 07/01/22 [phone]

## 2022-07-01 NOTE — ED Triage Notes (Signed)
Pt BIB [facility] for eval of chest pain EMS reports pt was working outside in his yard and noted onset of L sided chest pain. Pt noted associated dizziness, went back inside. Took 2 of his home SL NTG and reports relief of pain EMS reports initial pressure on their arrival 86/46, now improved to 130s systolic. EMS admin 324 NTG. Pt reports CP now 3/10 to L chest. Pt is s/p TAVR replacement 4-5 weeks ago, medtronic pacemaker in place. EMS reports intermittent wide complex tachycardia en route w/ concern for ? Vtach.

## 2022-07-01 NOTE — Consult Note (Addendum)
Cardiology Consultation   Patient ID: CASIMIR BARCELLOS MRN: [id number]; DOB: [birth year]  Admit date: 07/01/2022 Date of Consult: 07/01/2022  PCP:  Tally Joe, MD   [facility] Providers Cardiologist:  Lance Muss, MD  Electrophysiologist:  Hillis Range, MD       Patient Profile:   SCHNEIDER WARCHOL is a 88 y.o. 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 Donnald Garre.  History of Present Illness:   Mr. Crotteau is a 88 year old male who presents with acute chest pain that started around noon today.  He was working on his lawnmower and was about to get his chainsaw when he felt lightheaded and dizzy.  He sat down and felt sharp left-sided chest pain. Pain was 8-9/10. He took nitroglycerin then 10 to 15 minutes later he took another dose with improvement of the pain. By the time EMS arrived, his pain was 3/10. He states he has never has this pain before. He ate breakfast around 7am and reports staying hydrated. Denies orthopnea, abdominal pain, peripheral edema, nausea, or vomiting.   He has been doing well s/p TAVR. No other episodes of chest pain. He denies any problems with his pacemaker.   In the ED, he is currently chest pain-free and denies dizziness or lightheadedness. Troponin negative. Labs have been unremarkable. EKG showed atrial paced rhythm with prolonged PR interval.   Past Medical History:  Diagnosis Date   Anemia, unspecified    Arthritis    "right knee" (03/28/2013   CAD in native artery    Cardiomyopathy (HCC)    EF 40%:  ECHO 11/25/2015 EF 55%-60%   Carpal tunnel syndrome, bilateral    Chronic diastolic (congestive) heart failure (HCC)    Chronic kidney disease (CKD), stage III  (moderate) (HCC)    Epistaxis    Essential hypertension, benign    takes Metoprolol daily   H/O hiatal hernia    History of kidney stones    Hyperlipidemia    takes Lipitor daily   Mitral regurgitation    OSA on CPAP    wears CPAP 02/03/16   Pacemaker    MDT   PAF (paroxysmal atrial fibrillation) (HCC)    Paroxysmal ventricular tachycardia (HCC)    PONV (postoperative nausea and vomiting)    PVC's (premature ventricular contractions)    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 Excell Seltzer & Laneta Simmers.   Severe aortic stenosis    Vitamin D deficiency     Past Surgical History:  Procedure Laterality Date   CARDIAC CATHETERIZATION  03/21/2013   CARDIOVERSION N/A 10/09/2018   Procedure: CARDIOVERSION;  Surgeon: Wendall Stade, MD;  Location: [facility] ENDOSCOPY;  Service: Cardiovascular;  Laterality: N/A;   CARPAL TUNNEL RELEASE Right 02/05/2016   Procedure: RIGHT LIMITED OPEN CARPAL TUNNEL RELEASE;  Surgeon: Dominica Severin, MD;  Location: [facility] OR;  Service: Orthopedics;  Laterality: Right;   CARPAL TUNNEL RELEASE Left 04/21/2016   Procedure: CARPAL TUNNEL RELEASE;  Surgeon: Dominica Severin, MD;  Location: [facility] OR;  Service: Orthopedics;  Laterality: Left;   CATARACT EXTRACTION W/ INTRAOCULAR LENS  IMPLANT, BILATERAL Bilateral 2000's  COLONOSCOPY     CORONARY ANGIOPLASTY WITH STENT PLACEMENT  03/28/2013   "3 stents" (03/28/2013)   HIP ARTHROPLASTY Right 04/30/2019   Procedure: ARTHROPLASTY BIPOLAR HIP (HEMIARTHROPLASTY);  Surgeon: Durene Romans, MD;  Location: [facility] ORS;  Service: Orthopedics;  Laterality: Right;   INSERT / REPLACE / REMOVE PACEMAKER     INTRAOPERATIVE TRANSTHORACIC ECHOCARDIOGRAM N/A 04/12/2022   Procedure: INTRAOPERATIVE TRANSTHORACIC ECHOCARDIOGRAM;  Surgeon: Tonny Bollman, MD;  Location: [facility] OR;  Service: Open Heart Surgery;  Laterality: N/A;   KNEE CARTILAGE SURGERY Left 1972   "cut it  open" (03/28/2013)   KNEE CARTILAGE SURGERY Right ~ 1977   "cut it open" (03/28/2013)   PACEMAKER INSERTION  10/05/10   MDT Adapta L implanted by Dr [name] for mobitz II second degree AV block   PERCUTANEOUS CORONARY ROTOBLATOR INTERVENTION (PCI-R) N/A 03/28/2013   Procedure: PERCUTANEOUS CORONARY ROTOBLATOR INTERVENTION (PCI-R);  Surgeon: Corky Crafts, MD;  Location: [facility] CATH LAB;  Service: Cardiovascular;  Laterality: N/A;   RIGHT/LEFT HEART CATH AND CORONARY ANGIOGRAPHY N/A 01/27/2022   Procedure: RIGHT/LEFT HEART CATH AND CORONARY ANGIOGRAPHY;  Surgeon: Corky Crafts, MD;  Location: [facility] INVASIVE CV LAB;  Service: Cardiovascular;  Laterality: N/A;   SEPTOPLASTY N/A 02/05/2016   Procedure: SEPTOPLASTY WITH INTRANASAL SKIN GRAFT;  Surgeon: Drema Halon, MD;  Location: [facility] OR;  Service: ENT;  Laterality: N/A;   SHOULDER HEMI-ARTHROPLASTY Right 1987   "got hit by sled & dragged down sheet > 200 feet; tore shoulder up" (03/28/2013)   SHOULDER SURGERY Right 1970's   "pulled muscle apart; sewed it back together" (03/28/2013   TOTAL KNEE ARTHROPLASTY Right 2007   TOTAL KNEE ARTHROPLASTY  11/26/2012   Procedure: TOTAL KNEE ARTHROPLASTY;  Surgeon: Loanne Drilling, MD;  Location: [facility] ORS;  Service: Orthopedics;  Laterality: Left;   TRANSCATHETER AORTIC VALVE REPLACEMENT, TRANSFEMORAL N/A 04/12/2022   Procedure: Transcatheter Aortic Valve Replacement, Transfemoral;  Surgeon: Tonny Bollman, MD;  Location: [facility] OR;  Service: Open Heart Surgery;  Laterality: N/A;   TRANSURETHRAL RESECTION OF PROSTATE N/A 12/27/2017   Procedure: TRANSURETHRAL RESECTION OF THE PROSTATE (TURP);  Surgeon: Crista Elliot, MD;  Location: [facility];  Service: Urology;  Laterality: N/A;     Home Medications:  Prior to Admission medications   Medication Sig Start Date End Date Taking? Authorizing Provider  amiodarone (PACERONE) 200 MG tablet TAKE 1 TABLET BY MOUTH DAILY 02/15/22   Corky Crafts, MD  atorvastatin (LIPITOR) 10 MG tablet TAKE 1 TABLET BY MOUTH  DAILY AT 6 PM. 12/06/21   Corky Crafts, MD  carvedilol (COREG) 3.125 MG tablet Take 1 tablet (3.125 mg total) by mouth 2 (two) times daily. 04/13/22 04/13/23  Janetta Hora, PA-C  cephALEXin (KEFLEX) 500 MG capsule Take 4 capsules by mouth 30-60 min prior to dental appointment. 04/27/22   Janetta Hora, PA-C  ELIQUIS 2.5 MG TABS tablet TAKE 1 TABLET BY MOUTH TWICE  DAILY 05/16/22   Corky Crafts, MD  ENTRESTO 24-26 MG TAKE 1 TABLET BY MOUTH  TWICE DAILY 02/28/22   [name], MD  furosemide (LASIX) 40 MG tablet Take 1 tablet (40 mg total) by mouth daily. 01/13/22   Corky Crafts, MD  latanoprost (XALATAN) 0.005 % ophthalmic solution Place 1 drop into the left eye at bedtime. 11/28/20   [provider]  Multiple Vitamins-Minerals (PRESERVISION AREDS 2+MULTI VIT PO) Take 1 capsule by mouth in the morning and at bedtime.  [provider]  nitroGLYCERIN (NITROSTAT) 0.4 MG SL tablet Place 0.4 mg under the tongue every 5 (five) minutes as needed for chest pain.    [provider]  Omega-3 Fatty Acids (FISH OIL) 1000 MG CAPS Take 1,000 mg by mouth daily.    [provider]  timolol (TIMOPTIC) 0.5 % ophthalmic solution Place 1 drop into the right eye 2 (two) times daily. 01/19/22   [provider]    Inpatient Medications: Scheduled Meds:  betamethasone acetate-betamethasone sodium phosphate  3 mg Intramuscular Once   Continuous Infusions:  PRN Meds:   Allergies:    Allergies  Allergen Reactions   Penicillins Hives    Has tolerated Keflex      Has patient had a PCN reaction causing immediate rash, facial/tongue/throat swelling, SOB or lightheadedness with hypotension: YES Has patient had a PCN reaction causing severe rash involving mucus membranes or skin necrosis: No Has patient had a PCN reaction that required hospitalization No Has patient had a PCN  reaction occurring within the last 10 years: No If all of the above answers are "NO", then may proceed with Cephalosporin use.    Hydromorphone Hcl Nausea And Vomiting    Social History:   Social History   Socioeconomic History   Marital status: Widowed    Spouse name: Not on file   Number of children: 3   Years of education: Not on file   Highest education level: Not on file  Occupational History   Occupation: retired    Associate Professor: [employer]  Tobacco Use   Smoking status: Former    Packs/day: 1.00    Years: 26.00    Total pack years: 26.00    Types: Cigarettes    Quit date: 10/31/1973    Years since quitting: 48.6   Smokeless tobacco: Never  Vaping Use   Vaping Use: Never used  Substance and Sexual Activity   Alcohol use: No   Drug use: No   Sexual activity: Not Currently  Other Topics Concern   Not on file  Social History Narrative   Not on file   Social Determinants of Health   Financial Resource Strain: Not on file  Food Insecurity: Not on file  Transportation Needs: Not on file  Physical Activity: Not on file  Stress: Not on file  Social Connections: Not on file  Intimate Partner Violence: Not on file    Family History:   Family History  Problem Relation Age of Onset   CAD Brother 36   Stroke Brother    Cancer Other    Heart disease Other    Heart attack Neg Hx    Hypertension Neg Hx      ROS:  Please see the history of present illness.  All other ROS reviewed and negative.     Physical Exam/Data:   Vitals:   07/01/22 1615 07/01/22 1645 07/01/22 1730 07/01/22 1745  BP: 126/78 135/72 133/74 137/71  Pulse: 62 60 (!) 59 (!) 59  Resp: 12 12 19 17   Temp:      TempSrc:      SpO2: 96% 100% 97% 99%  Weight:      Height:       No intake or output data in the 24 hours ending 07/01/22 1812    07/01/2022    2:14 PM 05/20/2022   11:31 AM 04/22/2022   12:07 PM  Last 3 Weights  Weight (lbs) 189 lb 9.5 oz 193 lb 194 lb  Weight (kg)  86 kg 87.544  kg 87.998 kg     Body mass index is 28.83 kg/m.  General:  Well nourished, well developed, in no acute distress HEENT: normal Neck: no JVD Vascular: Distal pulses 2+ bilaterally Cardiac: RRR, holosystolic murmur  Lungs: normal work of breathing, clear to auscultation bilaterally, no wheezing, rhonchi or rales  Abd: soft, nontender, non-distended  Ext: no edema Musculoskeletal:  No deformities Skin: warm and dry  Neuro:  alert and oriented, no focal abnormalities noted Psych:  Normal affect   EKG:  The EKG was personally reviewed and demonstrates:  atrial paced rhythm, prolonged PR interval   Relevant CV Studies: 05/20/22 Echo   1. Left ventricular ejection fraction, by estimation, is 55%. The left  ventricle has normal function. The left ventricle has no regional wall  motion abnormalities. There is mild concentric left ventricular  hypertrophy. Left ventricular diastolic  parameters are indeterminate.   2. Right ventricular systolic function is normal. The right ventricular  size is normal. There is normal pulmonary artery systolic pressure. The  estimated right ventricular systolic pressure is 34.8 mmHg.   3. Left atrial size was moderately dilated.   4. Right atrial size was mildly dilated.   5. The mitral valve is degenerative. Suspect moderate mitral valve  regurgitation, eccentric with horizontal color splay. No evidence of  mitral stenosis.   6. Tricuspid valve regurgitation is mild to moderate.   7. The aortic valve has been replaced by a 26 mm Edwards Sapien Valve.  Aortic valve regurgitation is not visualized. Effective orifice area, by  VTI measures 2.28 cm. Aortic valve mean gradient measures 8.0 mmHg. DVI  0.63. Peak gradient 15 mm Hg.   8. Echo findings are consistent with normal structure and function of the  aortic valve prosthesis.  Laboratory Data:  High Sensitivity Troponin:   Recent Labs  Lab 07/01/22 1505 07/01/22 1640  TROPONINIHS 13 14      Chemistry Recent Labs  Lab 07/01/22 1640  NA 142  K 4.3  CL 103  CO2 25  GLUCOSE 89  BUN 31*  CREATININE 2.01*  CALCIUM 9.0  GFRNONAA 31*  ANIONGAP 14    No results for input(s): "PROT", "ALBUMIN", "AST", "ALT", "ALKPHOS", "BILITOT" in the last 168 hours. Lipids No results for input(s): "CHOL", "TRIG", "HDL", "LABVLDL", "LDLCALC", "CHOLHDL" in the last 168 hours.  Hematology Recent Labs  Lab 07/01/22 1505  WBC 10.1  RBC 4.34  HGB 13.6  HCT 42.8  MCV 98.6  MCH 31.3  MCHC 31.8  RDW 13.6  PLT 127*   Thyroid No results for input(s): "TSH", "FREET4" in the last 168 hours.  BNPNo results for input(s): "BNP", "PROBNP" in the last 168 hours.  DDimer No results for input(s): "DDIMER" in the last 168 hours.   Radiology/Studies:  DG Chest 2 View  Result Date: 07/01/2022 CLINICAL DATA:  Chest pain. EXAM: CHEST - 2 VIEW COMPARISON:  April 08, 2022. FINDINGS: Status post transcatheter aortic valve repair. Stable mild cardiomegaly. Stable hiatal hernia is noted. Lungs are clear. Left-sided pacemaker is unchanged. Bony thorax is unremarkable. IMPRESSION: No active cardiopulmonary disease.  Stable hiatal hernia. Aortic Atherosclerosis (ICD10-I70.0). Electronically Signed   By: Lupita Raider M.D.   On: 07/01/2022 14:52     Assessment and Plan:   Chest Pain Troponins were negative.  EKG reassuring.  Remaining labs were unremarkable. Patient is no longer having chest pain, lightheadedness or dizziness. Given his chest pain resolved and reassuring labs, cardiac catheterization is  not necessary at this time. We will plan for medical management with outpatient follow up.  - start Imdur 30 mg daily at bedtime - increase Coreg to 12.5 mg twice daily - follow-up with cardiology outpatient   HTN BP was well-controlled.    For questions or updates, please contact [facility] Please consult [email or web address] for contact info under    Signed, Rana Snare, DO  07/01/2022 6:12 PM     ATTENDING ATTESTATION:   After conducting a review of all available clinical information with the care team, interviewing the patient, and performing a physical exam, I agree with the findings and plan described in this note.     Patient is a 88 year old male with a history of aortic stenosis status post TAVR with a 23 mm SAPIEN 3 valve earlier this year, coronary artery disease with known moderate to severe disease of the mid to distal left circumflex with a patent LAD stent with mild disease elsewhere, complete heart block status post permanent pacemaker, paroxysmal atrial fibrillation on Eliquis, and chronic kidney disease stage III who presents with acute onset chest pain.  The patient was in his normal state of health.  He was getting ready to use a chainsaw on a tree when he became dizzy and sat down.  At that point in time he developed a sharp focal chest pain.  This was associated with diaphoresis.  He took a nitroglycerin with minimal relief.  He ended up taking another nitroglycerin.  After the second nitroglycerin the pain abated after about 15 minutes.  He became concerned and came to the [facility] emergency department.  Here his initial EKG demonstrates a pacing with no acute ischemic changes.  His troponins x2 are negative.  Other laboratories were unremarkable.   GEN: No acute distress.    HEENT:  MMM, no JVD, no scleral icterus  Cardiac: RRR, no murmurs, rubs, or gallops.  No tenderness to palpation of chest wall  Respiratory: Clear to auscultation bilaterally.  GI: Soft, nontender, non-distended  MS: No edema; No deformity.  Neuro:  Nonfocal   Vasc:  +2 radial pulses   Plan:  1.  Chest pain: The patient had 2 negative troponins.  He is a 88 year old male with known moderate to severe obstructive coronary artery disease of the left circumflex.  I think in this particular patient medical therapy should be exhausted prior to any invasive procedures.  For this reason we will start  Imdur 30 mg at bedtime and increase the patient's Coreg to 12.5 mg twice daily.  He is relatively bradycardic but has a pacemaker in place so I do not think this will be an issue.  His blood pressure is elevated.  I did speak with the patient and his son about this clinical scenario.  I think given his negative troponin we can discharge him home and follow-up in cardiology clinic.  His medical therapy can be intensified and if he remains refractory to medical therapy we can consider an invasive assessment in this active 88 year old male but again this must be balanced against his advanced age.  2.  Aortic stenosis status post TAVR: He had an echocardiogram recently which was reassuring.  He has no signs or symptoms on exam suggestive of significant TAVR dysfunction.  3.  Paroxysmal atrial fibrillation: Continue Eliquis  4.  Coronary artery disease: See discussion above 5.  Complete heart block status post permanent pacemaker: Pacemaker interrogation June 2023 was unremarkable.  Alverda Skeans, MD  Pager [phone]

## 2022-07-01 NOTE — ED Notes (Signed)
Pacemaker interrogated. 

## 2022-07-01 NOTE — Discharge Instructions (Addendum)
The cardiologist wanted you to increase your Coreg to 12.5 mg 2 times a day.  A new prescription was sent to your pharmacy at [facility].  You can discontinue taking the 3.25.  Also you were started on a new medication called Imdur 30 mg that they want you to take at bedtime every night.  If you have any further episodes of chest pain or start having significant shortness of breath, passing out or other concerns return to the emergency room or call your cardiologist office.

## 2022-07-01 NOTE — ED Provider Notes (Signed)
[facility] EMERGENCY DEPARTMENT Provider Note   CSN: [id number] Arrival date & time: 07/01/22  1408     History  Chief Complaint  Patient presents with   Chest Pain    Gregory Mccormick is a 88 y.o. male.  HPI Patient was outside working today.  He had mowed the yard and was getting out his chain saw to start working on some trees.  He reports that he got a sudden and severe pain in his left chest.  Sat down for a few minutes and it was not improving.  He also then felt lightheaded and nauseated.  He took 1 nitroglycerin but symptoms were persisting.  He took a second nitroglycerin and got some improvement.  He went inside to rest.  Has some dual chest pain and discussed it with his son and at that time called EMS.  Patient was transported with stable vital signs.  At this time he reports a very mild discomfort in the chest that he would rated a 1 out of 10.  No ongoing symptoms of shortness of breath, nausea, sweatiness.  He reports he otherwise feels well again.  Patient is status post TAVR and pacer placement [phone] by Dr. Lavinia Sharps.  He reports he has been doing extremely well since his surgery.  He has not been having any problems with chest pain, shortness of breath or lightheadedness.  Triage: Right right pt BIB [facility] for eval of chest pain EMS reports pt was working outside in his yard and noted onset of L sided chest pain. Pt noted associated dizziness, went back inside. Took 2 of his home SL NTG and reports relief of pain EMS reports initial pressure on their arrival 86/46, now improved to 130s systolic. EMS admin 324 NTG. Pt reports CP now 3/10 to L chest. Pt is s/p TAVR replacement 4-5 weeks ago, medtronic pacemaker in place. EMS reports intermittent wide complex tachycardia en route w/ concern for ? Vtach.     Home Medications Prior to Admission medications   Medication Sig Start Date End Date Taking? Authorizing Provider  amiodarone (PACERONE) 200 MG tablet TAKE  1 TABLET BY MOUTH DAILY 02/15/22   Corky Crafts, MD  atorvastatin (LIPITOR) 10 MG tablet TAKE 1 TABLET BY MOUTH  DAILY AT 6 PM. 12/06/21   Corky Crafts, MD  carvedilol (COREG) 3.125 MG tablet Take 1 tablet (3.125 mg total) by mouth 2 (two) times daily. 04/13/22 04/13/23  Janetta Hora, PA-C  cephALEXin (KEFLEX) 500 MG capsule Take 4 capsules by mouth 30-60 min prior to dental appointment. 04/27/22   Janetta Hora, PA-C  ELIQUIS 2.5 MG TABS tablet TAKE 1 TABLET BY MOUTH TWICE  DAILY 05/16/22   Corky Crafts, MD  ENTRESTO 24-26 MG TAKE 1 TABLET BY MOUTH  TWICE DAILY 02/28/22   [name], MD  furosemide (LASIX) 40 MG tablet Take 1 tablet (40 mg total) by mouth daily. 01/13/22   Corky Crafts, MD  latanoprost (XALATAN) 0.005 % ophthalmic solution Place 1 drop into the left eye at bedtime. 11/28/20   [provider]  Multiple Vitamins-Minerals (PRESERVISION AREDS 2+MULTI VIT PO) Take 1 capsule by mouth in the morning and at bedtime.    [provider]  nitroGLYCERIN (NITROSTAT) 0.4 MG SL tablet Place 0.4 mg under the tongue every 5 (five) minutes as needed for chest pain.    [provider]  Omega-3 Fatty Acids (FISH OIL) 1000 MG CAPS Take 1,000 mg by  mouth daily.    [provider]  timolol (TIMOPTIC) 0.5 % ophthalmic solution Place 1 drop into the right eye 2 (two) times daily. 01/19/22   [provider]      Allergies    Penicillins and Hydromorphone hcl    Review of Systems   Review of Systems 10 systems reviewed negative except as per HPI Physical Exam Updated Vital Signs BP 135/72   Pulse 60   Temp (!) 97.3 F (36.3 C) (Oral)   Resp 12   Ht 5\' 8"  (1.727 m)   Wt 86 kg   SpO2 100%   BMI 28.83 kg/m  Physical Exam Constitutional:      Comments: Alert nontoxic clinically well in appearance.  No shortness of breath.  HENT:     Head: Normocephalic and atraumatic.     Mouth/Throat:     Pharynx: Oropharynx  is clear.  Eyes:     Extraocular Movements: Extraocular movements intact.  Cardiovascular:     Comments: Regular 3/6 systolic ejection murmur. Pulmonary:     Effort: Pulmonary effort is normal.     Breath sounds: Normal breath sounds.  Abdominal:     General: There is no distension.     Palpations: Abdomen is soft.     Tenderness: There is no abdominal tenderness. There is no guarding.  Musculoskeletal:        General: Normal range of motion.     Comments: 1-2+ edema symmetric bilateral lower legs.  Calves soft and nontender.  Skin changes consistent with chronic venous stasis.  Neurological:     General: No focal deficit present.     Mental Status: He is oriented to person, place, and time.     Motor: No weakness.     Coordination: Coordination normal.  Psychiatric:        Mood and Affect: Mood normal.     ED Results / Procedures / Treatments   Labs (all labs ordered are listed, but only abnormal results are displayed) Labs Reviewed  CBC - Abnormal; Notable for the following components:      Result Value   Platelets 127 (*)    All other components within normal limits  BASIC METABOLIC PANEL  TROPONIN I (HIGH SENSITIVITY)  TROPONIN I (HIGH SENSITIVITY)    EKG EKG Interpretation  Date/Time:  Friday July 01 2022 14:17:40 EDT Ventricular Rate:  64 PR Interval:  402 QRS Duration: 115 QT Interval:  433 QTC Calculation: 447 R Axis:   74 Text Interpretation: Sinus or ectopic atrial rhythm Prolonged PR interval Probable left atrial enlargement Nonspecific intraventricular conduction delay no sig change from previos Confirmed by 04-05-1993 [phone]) on 07/01/2022 2:49:10 PM  Radiology DG Chest 2 View  Result Date: 07/01/2022 CLINICAL DATA:  Chest pain. EXAM: CHEST - 2 VIEW COMPARISON:  April 08, 2022. FINDINGS: Status post transcatheter aortic valve repair. Stable mild cardiomegaly. Stable hiatal hernia is noted. Lungs are clear. Left-sided pacemaker is unchanged. Bony  thorax is unremarkable. IMPRESSION: No active cardiopulmonary disease.  Stable hiatal hernia. Aortic Atherosclerosis (ICD10-I70.0). Electronically Signed   By: April 10, 2022 M.D.   On: 07/01/2022 14:52    Procedures Procedures    Medications Ordered in ED Medications - No data to display  ED Course/ Medical Decision Making/ A&P                           Medical Decision Making Amount and/or Complexity of Data Reviewed Labs:  ordered. Radiology: ordered.  Patient experience chest pain with associated symptoms of lightheadedness and shortness of breath after exertion.  Symptoms were improved with nitroglycerin.  We will proceed with diagnostic evaluation.  Differential diagnosis includes MI\CHF\pneumonia\musculoskeletal chest pain.  At this time patient is stable in appearance.  No respiratory distress.  Vital signs stable.  Reports pain is nearly resolved rating it 1 out of 10.  No immediate interventions at this time.  We will continue to monitor for pain or abnormal diagnostic results.  Troponin normal.  EKG reviewed by myself does not show any significant acute changes from previous.  Chest x-ray reviewed by radiology without acute findings.  Consult: 16: 31 Dr. Cristal Deer cardiology.  They will assess patient in the emergency department.  At this time patient is stable and comfortable.  If remains the same with normal results, disposition will be pending cardiology's assessment.  Dr. Anitra Lauth to follow-up with cardiology consult and patient reassessment as needed.        Final Clinical Impression(s) / ED Diagnoses Final diagnoses:  None    Rx / DC Orders ED Discharge Orders     None         Arby Barrette, MD 07/01/22 1705

## 2022-07-05 NOTE — Progress Notes (Unsigned)
Cardiology Office Note:    Date:  07/06/2022   ID:  Audree Camel, DOB January 12, 1929, MRN [id number]  PCP:  Tally Joe, MD  [facility] Providers Cardiologist:  Lance Muss, MD Electrophysiologist:  Hillis Range, MD     Referring MD: Tally Joe, MD   Chief Complaint:  Hospitalization Follow-up     History of Present Illness:   TINA TEMME is a 88 y.o. male with a hx of aortic stenosis s/p TAVR on 04/12/22, CAD s/p LAD and LCX stent in 2014, paroxysmal A-fib on Eliquis, secondary heart block s/p Medtronic pacemaker, degenerative MR, aortic root dilation, HTN, HLD, CKD III, OSA on CPAP.  Patient was seen in ED 07/01/22 with chest pain.  He was working on his lawnmower and was about to get his chainsaw when he felt lightheaded and dizzy.  He sat down and felt sharp left-sided chest pain. Pain was 8-9/10. He took nitroglycerin then 10 to 15 minutes later he took another dose with improvement of the pain. Troponin negative EKG atrial paced. Imdur started and coreg increased.   Patient comes in for f/u. He says he mowed for 3-4 hrs and then was going to cut up a tree. Patient has not had any chest pain since then but hasn't done anything strenuous. Crt 2.01 in hospital up from 1.49 on higher dose lasix. Says he's going to eat a sirloin biscuit when he leaves here. Eats out country cooking daily and not going to change.    Past Medical History:  Diagnosis Date   Anemia, unspecified    Arthritis    "right knee" (03/28/2013   CAD in native artery    Cardiomyopathy (HCC)    EF 40%:  ECHO 11/25/2015 EF 55%-60%   Carpal tunnel syndrome, bilateral    Chronic diastolic (congestive) heart failure (HCC)    Chronic kidney disease (CKD), stage III (moderate) (HCC)    Epistaxis    Essential hypertension, benign    takes Metoprolol daily   H/O hiatal hernia    History of kidney stones    Hyperlipidemia    takes Lipitor daily   Mitral regurgitation    OSA on CPAP    wears CPAP  02/03/16   Pacemaker    MDT   PAF (paroxysmal atrial fibrillation) (HCC)    Paroxysmal ventricular tachycardia (HCC)    PONV (postoperative nausea and vomiting)    PVC's (premature ventricular contractions)    S/P TAVR (transcatheter aortic valve replacement) 04/12/2022   s/p TAVR with a 76mm Melodye Ped (with an extra CC) via the TF approach by Dr. Excell Seltzer & Laneta Simmers.   Severe aortic stenosis    Vitamin D deficiency    Current Medications: Current Meds  Medication Sig   amiodarone (PACERONE) 200 MG tablet TAKE 1 TABLET BY MOUTH DAILY   atorvastatin (LIPITOR) 10 MG tablet TAKE 1 TABLET BY MOUTH  DAILY AT 6 PM.   carvedilol (COREG) 12.5 MG tablet Take 1 tablet (12.5 mg total) by mouth 2 (two) times daily with a meal.   cephALEXin (KEFLEX) 500 MG capsule Take 4 capsules by mouth 30-60 min prior to dental appointment.   ELIQUIS 2.5 MG TABS tablet TAKE 1 TABLET BY MOUTH TWICE  DAILY   ENTRESTO 24-26 MG TAKE 1 TABLET BY MOUTH  TWICE DAILY   furosemide (LASIX) 40 MG tablet Take 1 tablet (40 mg total) by mouth daily.   isosorbide mononitrate (IMDUR) 30 MG 24 hr tablet Take 1 tablet (30 mg total)  by mouth at bedtime.   latanoprost (XALATAN) 0.005 % ophthalmic solution Place 1 drop into the left eye at bedtime.   Multiple Vitamins-Minerals (PRESERVISION AREDS 2+MULTI VIT PO) Take 1 capsule by mouth in the morning and at bedtime.   nitroGLYCERIN (NITROSTAT) 0.4 MG SL tablet Place 0.4 mg under the tongue every 5 (five) minutes as needed for chest pain.   Omega-3 Fatty Acids (FISH OIL) 1000 MG CAPS Take 1,000 mg by mouth daily.   timolol (TIMOPTIC) 0.5 % ophthalmic solution Place 1 drop into the right eye 2 (two) times daily.   Current Facility-Administered Medications for the 07/06/22 encounter (Office Visit) with Dyann Kief, PA-C  Medication   betamethasone acetate-betamethasone sodium phosphate (CELESTONE) injection 3 mg    Allergies:   Penicillins and Hydromorphone hcl   Social History    Tobacco Use   Smoking status: Former    Packs/day: 1.00    Years: 26.00    Total pack years: 26.00    Types: Cigarettes    Quit date: 10/31/1973    Years since quitting: 48.7   Smokeless tobacco: Never  Vaping Use   Vaping Use: Never used  Substance Use Topics   Alcohol use: No   Drug use: No    Family Hx: The patient's family history includes CAD (age of onset: 36) in his brother; Cancer in an other family member; Heart disease in an other family member; Stroke in his brother. There is no history of Heart attack or Hypertension.  ROS   EKGs/Labs/Other Test Reviewed:    EKG:  EKG is  not ordered today.    Recent Labs: 07/12/2021: TSH 4.250 01/11/2022: NT-Pro BNP 636 04/08/2022: ALT 12 04/13/2022: Magnesium 2.2 07/01/2022: BUN 31; Creatinine, Ser 2.01; Hemoglobin 13.6; Platelets 127; Potassium 4.3; Sodium 142   Recent Lipid Panel No results for input(s): "CHOL", "TRIG", "HDL", "VLDL", "LDLCALC", "LDLDIRECT" in the last 8760 hours.   Prior CV Studies:   05/20/22 Echo   1. Left ventricular ejection fraction, by estimation, is 55%. The left  ventricle has normal function. The left ventricle has no regional wall  motion abnormalities. There is mild concentric left ventricular  hypertrophy. Left ventricular diastolic  parameters are indeterminate.   2. Right ventricular systolic function is normal. The right ventricular  size is normal. There is normal pulmonary artery systolic pressure. The  estimated right ventricular systolic pressure is 34.8 mmHg.   3. Left atrial size was moderately dilated.   4. Right atrial size was mildly dilated.   5. The mitral valve is degenerative. Suspect moderate mitral valve  regurgitation, eccentric with horizontal color splay. No evidence of  mitral stenosis.   6. Tricuspid valve regurgitation is mild to moderate.   7. The aortic valve has been replaced by a 26 mm Edwards Sapien Valve.  Aortic valve regurgitation is not visualized. Effective  orifice area, by  VTI measures 2.28 cm. Aortic valve mean gradient measures 8.0 mmHg. DVI  0.63. Peak gradient 15 mm Hg.   8. Echo findings are consistent with normal structure and function of the  aortic valve prosthesis.      Risk Assessment/Calculations/Metrics:    CHA2DS2-VASc Score = 5   This indicates a 7.2% annual risk of stroke. The patient's score is based upon: CHF History: 1 HTN History: 1 Diabetes History: 0 Stroke History: 0 Vascular Disease History: 1 Age Score: 2 Gender Score: 0             Physical Exam:  VS:  BP 110/64 (BP Location: Left Arm, Patient Position: Sitting, Cuff Size: Normal)   Pulse 74   Ht 5\' 8"  (1.727 m)   Wt 190 lb 9.6 oz (86.5 kg)   SpO2 99%   BMI 28.98 kg/m     Wt Readings from Last 3 Encounters:  07/06/22 190 lb 9.6 oz (86.5 kg)  07/01/22 189 lb 9.5 oz (86 kg)  05/20/22 193 lb (87.5 kg)    Physical Exam  GEN: Well nourished, well developed, in no acute distress  Neck: no JVD, carotid bruits, or masses Cardiac:RRR; no murmurs, rubs, or gallops  Respiratory:  clear to auscultation bilaterally, normal work of breathing GI: soft, nontender, nondistended, + BS Ext: brawny changes with trace edema Neuro:  Alert and Oriented x 3, Psych: euthymic mood, full affect       ASSESSMENT & PLAN:   No problem-specific Assessment & Plan notes found for this encounter.   Chest pain in ED 07/01/22 troponin negative Imdur added and coreg increased-no further chest pain but hasn't done anything strenuous.   CAD stent LAD and Cfx 2014-no recurrent chest pain  PAF on Eliquis-no bleeding problems.on lower dose eliquis with age and CKD  Medtronic pacemaker  S/P TAVR-last echo stable  HTN well controlled  HLD on lipitor  CKD III-Crt up 2.01 from 1.49 on higher dose lasix 40 mg daily. Try to reduce 20 mg daily and can take and extra 20 mg for edema or weight gain 2-3 lbs overnight. Eats a high salt diet.   OSA on CPAP           Dispo:  No follow-ups on file.   Medication Adjustments/Labs and Tests Ordered: Current medicines are reviewed at length with the patient today.  Concerns regarding medicines are outlined above.  Tests Ordered: No orders of the defined types were placed in this encounter.  Medication Changes: No orders of the defined types were placed in this encounter.  08/31/22, PA-C  07/06/2022 8:15 AM    [facility] [address] [place], [place], [place]  [place] Phone: [phone]; Fax: [phone]

## 2022-07-06 ENCOUNTER — Ambulatory Visit: Payer: Medicare Other | Attending: Physician Assistant | Admitting: Physician Assistant

## 2022-07-06 ENCOUNTER — Encounter: Payer: Self-pay | Admitting: Physician Assistant

## 2022-07-06 VITALS — BP 110/64 | HR 74 | Ht 68.0 in | Wt 190.6 lb

## 2022-07-06 DIAGNOSIS — I10 Essential (primary) hypertension: Secondary | ICD-10-CM

## 2022-07-06 DIAGNOSIS — G4733 Obstructive sleep apnea (adult) (pediatric): Secondary | ICD-10-CM

## 2022-07-06 DIAGNOSIS — I480 Paroxysmal atrial fibrillation: Secondary | ICD-10-CM | POA: Diagnosis not present

## 2022-07-06 DIAGNOSIS — E785 Hyperlipidemia, unspecified: Secondary | ICD-10-CM

## 2022-07-06 DIAGNOSIS — I2510 Atherosclerotic heart disease of native coronary artery without angina pectoris: Secondary | ICD-10-CM | POA: Diagnosis not present

## 2022-07-06 DIAGNOSIS — Z952 Presence of prosthetic heart valve: Secondary | ICD-10-CM

## 2022-07-06 DIAGNOSIS — Z9989 Dependence on other enabling machines and devices: Secondary | ICD-10-CM

## 2022-07-06 DIAGNOSIS — N1832 Chronic kidney disease, stage 3b: Secondary | ICD-10-CM

## 2022-07-06 DIAGNOSIS — Z950 Presence of cardiac pacemaker: Secondary | ICD-10-CM

## 2022-07-06 MED ORDER — FUROSEMIDE 40 MG PO TABS: 20.0000 mg | ORAL_TABLET | Freq: Every day | ORAL | 3 refills | Status: DC

## 2022-07-06 NOTE — Patient Instructions (Signed)
Medication Instructions:  Your physician has recommended you make the following change in your medication:   DECREASE: Furosemide to 20mg  daily. You may take an extra as needed for weight gain >2-3lbs over night or >5lbs in 1 week.  *If you need a refill on your cardiac medications before your next appointment, please call your pharmacy*   Lab Work: None If you have labs (blood work) drawn today and your tests are completely normal, you will receive your results only by: MyChart Message (if you have MyChart) OR A paper copy in the mail If you have any lab test that is abnormal or we need to change your treatment, we will call you to review the results   Follow-Up: At [facility], you and your health needs are our priority.  As part of our continuing mission to provide you with exceptional heart care, we have created designated Provider Care Teams.  These Care Teams include your primary Cardiologist (physician) and Advanced Practice Providers (APPs -  Physician Assistants and Nurse Practitioners) who all work together to provide you with the care you need, when you need it.   Your next appointment:   As scheduled

## 2022-07-06 NOTE — Addendum Note (Signed)
Addended by: Cleda Mccreedy on: 07/06/2022 09:50 AM   Modules accepted: Orders

## 2022-07-19 ENCOUNTER — Ambulatory Visit (INDEPENDENT_AMBULATORY_CARE_PROVIDER_SITE_OTHER): Payer: Medicare Other

## 2022-07-19 DIAGNOSIS — I5032 Chronic diastolic (congestive) heart failure: Secondary | ICD-10-CM

## 2022-07-19 LAB — CUP PACEART REMOTE DEVICE CHECK
Battery Impedance: 2414 Ohm
Battery Remaining Longevity: 30 mo
Battery Voltage: 2.73 V
Brady Statistic AP VP Percent: 0 %
Brady Statistic AP VS Percent: 91 %
Brady Statistic AS VP Percent: 0 %
Brady Statistic AS VS Percent: 9 %
Date Time Interrogation Session: 20230919102603
Implantable Lead Implant Date: 20111206
Implantable Lead Implant Date: 20111206
Implantable Lead Location: 753859
Implantable Lead Location: 753860
Implantable Lead Model: 5076
Implantable Lead Model: 5092
Implantable Pulse Generator Implant Date: 20111206
Lead Channel Impedance Value: 495 Ohm
Lead Channel Impedance Value: 879 Ohm
Lead Channel Pacing Threshold Amplitude: 1 V
Lead Channel Pacing Threshold Amplitude: 1.125 V
Lead Channel Pacing Threshold Pulse Width: 0.4 ms
Lead Channel Pacing Threshold Pulse Width: 0.4 ms
Lead Channel Setting Pacing Amplitude: 2 V
Lead Channel Setting Pacing Amplitude: 2.5 V
Lead Channel Setting Pacing Pulse Width: 0.4 ms
Lead Channel Setting Sensing Sensitivity: 5.6 mV

## 2022-08-02 NOTE — Progress Notes (Signed)
Remote pacemaker transmission.   

## 2022-09-05 ENCOUNTER — Encounter: Payer: Self-pay | Admitting: Interventional Cardiology

## 2022-09-05 ENCOUNTER — Ambulatory Visit: Payer: Medicare Other | Attending: Interventional Cardiology | Admitting: Interventional Cardiology

## 2022-09-05 VITALS — BP 108/68 | HR 80 | Ht 69.0 in | Wt 189.8 lb

## 2022-09-05 DIAGNOSIS — I480 Paroxysmal atrial fibrillation: Secondary | ICD-10-CM | POA: Diagnosis not present

## 2022-09-05 DIAGNOSIS — I5032 Chronic diastolic (congestive) heart failure: Secondary | ICD-10-CM | POA: Diagnosis not present

## 2022-09-05 DIAGNOSIS — Z950 Presence of cardiac pacemaker: Secondary | ICD-10-CM | POA: Diagnosis not present

## 2022-09-05 DIAGNOSIS — I10 Essential (primary) hypertension: Secondary | ICD-10-CM

## 2022-09-05 DIAGNOSIS — N1832 Chronic kidney disease, stage 3b: Secondary | ICD-10-CM

## 2022-09-05 DIAGNOSIS — I2510 Atherosclerotic heart disease of native coronary artery without angina pectoris: Secondary | ICD-10-CM | POA: Diagnosis not present

## 2022-09-05 DIAGNOSIS — Z952 Presence of prosthetic heart valve: Secondary | ICD-10-CM

## 2022-09-05 DIAGNOSIS — G4733 Obstructive sleep apnea (adult) (pediatric): Secondary | ICD-10-CM

## 2022-09-05 NOTE — Patient Instructions (Signed)
Medication Instructions:  Your physician recommends that you continue on your current medications as directed. Please refer to the Current Medication list given to you today.  *If you need a refill on your cardiac medications before your next appointment, please call your pharmacy*   Lab Work: none If you have labs (blood work) drawn today and your tests are completely normal, you will receive your results only by: [place] (if you have MyChart) OR A paper copy in the mail If you have any lab test that is abnormal or we need to change your treatment, we will call you to review the results.   Testing/Procedures: none   Follow-Up: At [facility], you and your health needs are our priority.  As part of our continuing mission to provide you with exceptional heart care, we have created designated Provider Care Teams.  These Care Teams include your primary Cardiologist (physician) and Advanced Practice Providers (APPs -  Physician Assistants and Nurse Practitioners) who all work together to provide you with the care you need, when you need it.  We recommend signing up for the patient portal called "MyChart".  Sign up information is provided on this After Visit Summary.  MyChart is used to connect with patients for Virtual Visits (Telemedicine).  Patients are able to view lab/test results, encounter notes, upcoming appointments, etc.  Non-urgent messages can be sent to your provider as well.   To learn more about what you can do with MyChart, go to [email or web address].    Your next appointment:   9 month(s)  The format for your next appointment:   In Person  Provider:   Larae Grooms, MD     Other Instructions Please schedule follow up with EP.  (Patient in recalls)  Important Information About Sugar

## 2022-09-05 NOTE — Progress Notes (Signed)
Cardiology Office Note   Date:  09/05/2022   ID:  Gregory Mccormick, DOB 16-Aug-1929, MRN [id number]  PCP:  Antony Contras, MD    No chief complaint on file.  CAD  Wt Readings from Last 3 Encounters:  09/05/22 189 lb 12.8 oz (86.1 kg)  07/06/22 190 lb 9.6 oz (86.5 kg)  07/01/22 189 lb 9.5 oz (86 kg)       History of Present Illness: Gregory Mccormick is a 88 y.o. 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   He had remote PCI in 2014 and previously had normal LV function.    He was hospitalized in 08/2018 with atypical chest pain. Troponins were flat and CTA was negative for PE. However, his EF was noted to be down - EF 30-35%, diffuse HK, moderate AS, mild MR, mod LAE, severely dilated RA, mildly dilated RV, mild TR, PASP 46, elevated CVP.   He Was hospitalized and underwent right hip Hemiarthroplasty [id number] without complications. Patient had no trouble with heart failure or chest pain while in the hospital.   His wife Enid Derry passed away in 2019/06/04.   I saw patient 05/15/2019 with worsening leg edema right greater than left.  Edema resolved with diuresis.  After that time, he felt well.    Diagnosed with Severe AS causing some sx. still with some dyspnea on exertion if he overdoes it.  TTE 01/24/22 showed EF 55%, severe AS with a mean grad 32mmHg, peak grad 58.16mmHg, AVA 1.11cm2, DVI 0.18, SVI 45 as well as severe MR with prolapse and possible flail segment of the posterior leaflet with anteriorly directed jet.  [facility] 01/27/22 showed CAD with ostial and proximal LCX stenosis at 60%, 2nd marginal lesion at 80%, pRCA and mRCA at 25%, with hemodynamic consistent with pulmonary HTN. Given the lack of angina, intervention was deferred.     He was evaluated by the multidisciplinary valve team and underwent successful TAVR with a 26 mm Edwards Sapien 3 Ultra Resilia THV via the TF approach on 04/12/22. An extra CC was added during deployment. Post operative echo showed EF 55-60%, normally functioning TAVR with a mean gradient of 11 mmHg and no PVL. He was discharged on home Eliquis.   Chest pain in ED 07/01/22 troponin negative Imdur added and coreg increased. Crt up 2.01 from 1.49 on higher dose lasix 40 mg daily. Try to reduce 20 mg daily and can take and extra 20 mg for edema or weight gain 2-3 lbs overnight. Eats a high salt diet.      Past Medical History:  Diagnosis Date   Anemia, unspecified    Arthritis    "right knee" (03/28/2013   CAD in native artery    Cardiomyopathy ([place])    EF 40%:  ECHO 11/25/2015 EF 55%-60%   Carpal tunnel syndrome, bilateral    Chronic diastolic (congestive) heart failure (HCC)    Chronic kidney disease (CKD), stage III (moderate) (HCC)    Epistaxis    Essential hypertension, benign    takes Metoprolol daily   H/O hiatal hernia    History of kidney stones    Hyperlipidemia    takes Lipitor daily   Mitral regurgitation    OSA on CPAP    wears CPAP 02/03/16   Pacemaker  MDT   PAF (paroxysmal atrial fibrillation) (HCC)    Paroxysmal ventricular tachycardia (HCC)    PONV (postoperative nausea and vomiting)    PVC's (premature ventricular contractions)    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 Burt Knack & Cyndia Bent.   Severe aortic stenosis    Vitamin D deficiency     Past Surgical History:  Procedure Laterality Date   CARDIAC CATHETERIZATION  03/21/2013   CARDIOVERSION N/A 10/09/2018   Procedure: CARDIOVERSION;  Surgeon: Josue Hector, MD;  Location: [facility] ENDOSCOPY;  Service: Cardiovascular;  Laterality: N/A;   CARPAL TUNNEL RELEASE Right 02/05/2016   Procedure: RIGHT LIMITED OPEN CARPAL  TUNNEL RELEASE;  Surgeon: Roseanne Kaufman, MD;  Location: [place];  Service: Orthopedics;  Laterality: Right;   CARPAL TUNNEL RELEASE Left 04/21/2016   Procedure: CARPAL TUNNEL RELEASE;  Surgeon: Roseanne Kaufman, MD;  Location: [place];  Service: Orthopedics;  Laterality: Left;   CATARACT EXTRACTION W/ INTRAOCULAR LENS  IMPLANT, BILATERAL Bilateral 2000's   COLONOSCOPY     CORONARY ANGIOPLASTY WITH STENT PLACEMENT  03/28/2013   "3 stents" (03/28/2013)   HIP ARTHROPLASTY Right 04/30/2019   Procedure: ARTHROPLASTY BIPOLAR HIP (HEMIARTHROPLASTY);  Surgeon: Paralee Cancel, MD;  Location: [facility];  Service: Orthopedics;  Laterality: Right;   INSERT / REPLACE / REMOVE PACEMAKER     INTRAOPERATIVE TRANSTHORACIC ECHOCARDIOGRAM N/A 04/12/2022   Procedure: INTRAOPERATIVE TRANSTHORACIC ECHOCARDIOGRAM;  Surgeon: Sherren Mocha, MD;  Location: [place];  Service: Open Heart Surgery;  Laterality: N/A;   KNEE CARTILAGE SURGERY Left 1972   "cut it open" (03/28/2013)   KNEE CARTILAGE SURGERY Right ~ 1977   "cut it open" (03/28/2013)   PACEMAKER INSERTION  10/05/10   MDT Adapta L implanted by Dr Rayann Heman for mobitz II second degree AV block   PERCUTANEOUS CORONARY ROTOBLATOR INTERVENTION (PCI-R) N/A 03/28/2013   Procedure: PERCUTANEOUS CORONARY ROTOBLATOR INTERVENTION (PCI-R);  Surgeon: Jettie Booze, MD;  Location: [facility] CATH LAB;  Service: Cardiovascular;  Laterality: N/A;   RIGHT/LEFT HEART CATH AND CORONARY ANGIOGRAPHY N/A 01/27/2022   Procedure: RIGHT/LEFT HEART CATH AND CORONARY ANGIOGRAPHY;  Surgeon: Jettie Booze, MD;  Location: [place] CV LAB;  Service: Cardiovascular;  Laterality: N/A;   SEPTOPLASTY N/A 02/05/2016   Procedure: SEPTOPLASTY WITH INTRANASAL SKIN GRAFT;  Surgeon: Rozetta Nunnery, MD;  Location: [place];  Service: ENT;  Laterality: N/A;   SHOULDER HEMI-ARTHROPLASTY Right 1987   "got hit by sled & dragged down sheet > 200 feet; tore shoulder up" (03/28/2013)   SHOULDER SURGERY Right 1970's    "pulled muscle apart; sewed it back together" (03/28/2013   TOTAL KNEE ARTHROPLASTY Right 2007   TOTAL KNEE ARTHROPLASTY  11/26/2012   Procedure: TOTAL KNEE ARTHROPLASTY;  Surgeon: Gearlean Alf, MD;  Location: [facility];  Service: Orthopedics;  Laterality: Left;   TRANSCATHETER AORTIC VALVE REPLACEMENT, TRANSFEMORAL N/A 04/12/2022   Procedure: Transcatheter Aortic Valve Replacement, Transfemoral;  Surgeon: Sherren Mocha, MD;  Location: [place];  Service: Open Heart Surgery;  Laterality: N/A;   TRANSURETHRAL RESECTION OF PROSTATE N/A 12/27/2017   Procedure: TRANSURETHRAL RESECTION OF THE PROSTATE (TURP);  Surgeon: Lucas Mallow, MD;  Location: [facility];  Service: Urology;  Laterality: N/A;     Current Outpatient Medications  Medication Sig Dispense Refill   amiodarone (PACERONE) 200 MG tablet TAKE 1 TABLET BY MOUTH DAILY 90 tablet 3   atorvastatin (  LIPITOR) 10 MG tablet TAKE 1 TABLET BY MOUTH  DAILY AT 6 PM. 90 tablet 3   carvedilol (COREG) 12.5 MG tablet Take 1 tablet (12.5 mg total) by mouth 2 (two) times daily with a meal. 60 tablet 0   cephALEXin (KEFLEX) 500 MG capsule Take 4 capsules by mouth 30-60 min prior to dental appointment. 4 capsule 1   ELIQUIS 2.5 MG TABS tablet TAKE 1 TABLET BY MOUTH TWICE  DAILY 180 tablet 3   ENTRESTO 24-26 MG TAKE 1 TABLET BY MOUTH  TWICE DAILY 180 tablet 3   furosemide (LASIX) 40 MG tablet Take 0.5 tablets (20 mg total) by mouth daily. Take additional 20mg  as needed for weight gain >2-3lbs overnight or >5lbs weekly 90 tablet 3   isosorbide mononitrate (IMDUR) 30 MG 24 hr tablet Take 1 tablet (30 mg total) by mouth at bedtime. 30 tablet 0   latanoprost (XALATAN) 0.005 % ophthalmic solution Place 1 drop into the left eye at bedtime.     Multiple Vitamins-Minerals (PRESERVISION AREDS 2+MULTI VIT PO) Take 1 capsule by mouth in the morning and at bedtime.     nitroGLYCERIN (NITROSTAT) 0.4 MG SL tablet Place 0.4 mg under the tongue every 5  (five) minutes as needed for chest pain.     Omega-3 Fatty Acids (FISH OIL) 1000 MG CAPS Take 1,000 mg by mouth daily.     timolol (TIMOPTIC) 0.5 % ophthalmic solution Place 1 drop into the right eye 2 (two) times daily.     Current Facility-Administered Medications  Medication Dose Route Frequency Provider Last Rate Last Admin   betamethasone acetate-betamethasone sodium phosphate (CELESTONE) injection 3 mg  3 mg Intramuscular Once Edrick Kins, DPM        Allergies:   Penicillins and Hydromorphone hcl    Social History:  The patient  reports that he quit smoking about 48 years ago. His smoking use included cigarettes. He has a 26.00 pack-year smoking history. He has never used smokeless tobacco. He reports that he does not drink alcohol and does not use drugs.   Family History:  The patient's family history includes CAD (age of onset: 30) in his brother; Cancer in an other family member; Heart disease in an other family member; Stroke in his brother.    ROS:  Please see the history of present illness.   Otherwise, review of systems are positive for occasional leg swelling.   All other systems are reviewed and negative.    PHYSICAL EXAM: VS:  BP 108/68   Pulse 80   Ht 5\' 9"  (1.753 m)   Wt 189 lb 12.8 oz (86.1 kg)   SpO2 97%   BMI 28.03 kg/m  , BMI Body mass index is 28.03 kg/m. GEN: Well nourished, well developed, in no acute distress HEENT: normal Neck: no JVD, carotid bruits, or masses Cardiac: RRR; 2/6 systolic murmurs, rubs, or gallops,; right leg edema-chronic  Respiratory:  clear to auscultation bilaterally, normal work of breathing GI: soft, nontender, nondistended, + BS MS: no deformity or atrophy Skin: warm and dry, no rash Neuro:  Strength and sensation are intact Psych: euthymic mood, full affect   EKG:   The ekg ordered 9/1 demonstrates normal ECG, no ST changes   Recent Labs: 01/11/2022: NT-Pro BNP 636 04/08/2022: ALT 12 04/13/2022: Magnesium 2.2 07/01/2022:  BUN 31; Creatinine, Ser 2.01; Hemoglobin 13.6; Platelets 127; Potassium 4.3; Sodium 142   Lipid Panel    Component Value Date/Time   CHOL 108 04/30/2019 0518  CHOL 108 11/17/2017 0848   TRIG 29 04/30/2019 0518   HDL 52 04/30/2019 0518   HDL 56 11/17/2017 0848   CHOLHDL 2.1 04/30/2019 0518   VLDL 6 04/30/2019 0518   LDLCALC 50 04/30/2019 0518   LDLCALC 39 11/17/2017 0848     Other studies Reviewed: Additional studies/ records that were reviewed today with results demonstrating: echo personally reviewed.    ASSESSMENT AND PLAN:  CAD: No angina.  Known circ disease, medically managed.  No side effects with IMdur. PPM in place as well.  S/p TAVR: EF normalized after TAVR.   Mitral regurg: Improved on 7/23 echo. No CHF sx. Leg edema is more likely venous insufficiency.  Elevate legs at night. PAF: Eliquis for stroke prevention.  No bleeding problems.  Be careful to avoid falls.  Amiodarone to maintain sinus rhythm.  Low-dose.  He will need LFTs and TSH need to be checked regularly. HTN: The current medical regimen is effective;  continue present plan and medications.  Home readings in the 680 systolic range.  Careful to avoid orthostatic sx. Stay well hydrated.  Renal lesions: Renal cysts bilaterally. CRI, Cr.  1.9.     Current medicines are reviewed at length with the patient today.  The patient concerns regarding his medicines were addressed.  The following changes have been made:  No change  Labs/ tests ordered today include:  No orders of the defined types were placed in this encounter.   Recommend 150 minutes/week of aerobic exercise Low fat, low carb, high fiber diet recommended  Disposition:   FU in 9 months, he has follow-up with EP and structural clinic scheduled.   Signed, Larae Grooms, MD  09/05/2022 8:20 AM    [facility] [address], [place], [place]  [address] Phone: [phone]; Fax: [phone]

## 2022-10-04 NOTE — Progress Notes (Unsigned)
Electrophysiology Office Note Date: 10/06/2022  ID:  Gregory Mccormick, DOB 09/04/1929, MRN [id number]  PCP: Tally Joe, MD Primary Cardiologist: Lance Muss, MD Electrophysiologist: Dr. [name] -> Lanier Prude, MD  CC: Pacemaker follow-up  [name] is a 88 y.o. male seen today for Lanier Prude, MD for routine electrophysiology followup. Since last being seen in our clinic the patient reports doing very well.  he denies chest pain, palpitations, dyspnea, PND, orthopnea, nausea, vomiting, dizziness, syncope, edema, weight gain, or early satiety.   Device History: Medtronic Dual Chamber PPM implanted 09/2010 for symptomatic second degree AV block  Past Medical History:  Diagnosis Date   Anemia, unspecified    Arthritis    "right knee" (03/28/2013   CAD in native artery    Cardiomyopathy (HCC)    EF 40%:  ECHO 11/25/2015 EF 55%-60%   Carpal tunnel syndrome, bilateral    Chronic diastolic (congestive) heart failure (HCC)    Chronic kidney disease (CKD), stage III (moderate) (HCC)    Epistaxis    Essential hypertension, benign    takes Metoprolol daily   H/O hiatal hernia    History of kidney stones    Hyperlipidemia    takes Lipitor daily   Mitral regurgitation    OSA on CPAP    wears CPAP 02/03/16   Pacemaker    MDT   PAF (paroxysmal atrial fibrillation) (HCC)    Paroxysmal ventricular tachycardia (HCC)    PONV (postoperative nausea and vomiting)    PVC's (premature ventricular contractions)    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 Excell Seltzer & Laneta Simmers.   Severe aortic stenosis    Vitamin D deficiency    Past Surgical History:  Procedure Laterality Date   CARDIAC CATHETERIZATION  03/21/2013   CARDIOVERSION N/A 10/09/2018   Procedure: CARDIOVERSION;  Surgeon: Wendall Stade, MD;  Location: [facility] ENDOSCOPY;  Service: Cardiovascular;  Laterality: N/A;    CARPAL TUNNEL RELEASE Right 02/05/2016   Procedure: RIGHT LIMITED OPEN CARPAL TUNNEL RELEASE;  Surgeon: Dominica Severin, MD;  Location: [facility] OR;  Service: Orthopedics;  Laterality: Right;   CARPAL TUNNEL RELEASE Left 04/21/2016   Procedure: CARPAL TUNNEL RELEASE;  Surgeon: Dominica Severin, MD;  Location: [facility] OR;  Service: Orthopedics;  Laterality: Left;   CATARACT EXTRACTION W/ INTRAOCULAR LENS  IMPLANT, BILATERAL Bilateral 2000's   COLONOSCOPY     CORONARY ANGIOPLASTY WITH STENT PLACEMENT  03/28/2013   "3 stents" (03/28/2013)   HIP ARTHROPLASTY Right 04/30/2019   Procedure: ARTHROPLASTY BIPOLAR HIP (HEMIARTHROPLASTY);  Surgeon: Durene Romans, MD;  Location: [facility];  Service: Orthopedics;  Laterality: Right;   INSERT / REPLACE / REMOVE PACEMAKER     INTRAOPERATIVE TRANSTHORACIC ECHOCARDIOGRAM N/A 04/12/2022   Procedure: INTRAOPERATIVE TRANSTHORACIC ECHOCARDIOGRAM;  Surgeon: Tonny Bollman, MD;  Location[facility];  Service: Open Heart Surgery;  Laterality: N/A;   KNEE CARTILAGE SURGERY Left 1972   "cut it open" (03/28/2013)   KNEE CARTILAGE SURGERY Right ~ 1977   "cut it open" (03/28/2013)   PACEMAKER INSERTION  10/05/10   MDT Adapta L implanted by Dr [name] for mobitz II second degree AV block   PERCUTANEOUS CORONARY ROTOBLATOR INTERVENTION (PCI-R) N/A 03/28/2013   Procedure: PERCUTANEOUS CORONARY ROTOBLATOR INTERVENTION (PCI-R);  Surgeon: Corky Crafts, MD;  Location: [facility] CATH LAB;  Service: Cardiovascular;  Laterality: N/A;   RIGHT/LEFT HEART CATH AND CORONARY ANGIOGRAPHY N/A  01/27/2022   Procedure: RIGHT/LEFT HEART CATH AND CORONARY ANGIOGRAPHY;  Surgeon: Corky Crafts, MD;  Location: [facility] INVASIVE CV LAB;  Service: Cardiovascular;  Laterality: N/A;   SEPTOPLASTY N/A 02/05/2016   Procedure: SEPTOPLASTY WITH INTRANASAL SKIN GRAFT;  Surgeon: Drema Halon, MD;  Location: [facility] OR;  Service: ENT;  Laterality: N/A;   SHOULDER HEMI-ARTHROPLASTY Right 1987   "got hit by sled & dragged down sheet >  200 feet; tore shoulder up" (03/28/2013)   SHOULDER SURGERY Right 1970's   "pulled muscle apart; sewed it back together" (03/28/2013   TOTAL KNEE ARTHROPLASTY Right 2007   TOTAL KNEE ARTHROPLASTY  11/26/2012   Procedure: TOTAL KNEE ARTHROPLASTY;  Surgeon: Loanne Drilling, MD;  Location: [facility];  Service: Orthopedics;  Laterality: Left;   TRANSCATHETER AORTIC VALVE REPLACEMENT, TRANSFEMORAL N/A 04/12/2022   Procedure: Transcatheter Aortic Valve Replacement, Transfemoral;  Surgeon: Tonny Bollman, MD;  Location: [facility] OR;  Service: Open Heart Surgery;  Laterality: N/A;   TRANSURETHRAL RESECTION OF PROSTATE N/A 12/27/2017   Procedure: TRANSURETHRAL RESECTION OF THE PROSTATE (TURP);  Surgeon: Crista Elliot, MD;  [facility];  Service: Urology;  Laterality: N/A;    Current Outpatient Medications  Medication Sig Dispense Refill   amiodarone (PACERONE) 200 MG tablet TAKE 1 TABLET BY MOUTH DAILY 90 tablet 3   atorvastatin (LIPITOR) 10 MG tablet TAKE 1 TABLET BY MOUTH  DAILY AT 6 PM. 90 tablet 3   cephALEXin (KEFLEX) 500 MG capsule Take 4 capsules by mouth 30-60 min prior to dental appointment. 4 capsule 1   ELIQUIS 2.5 MG TABS tablet TAKE 1 TABLET BY MOUTH TWICE  DAILY 180 tablet 3   ENTRESTO 24-26 MG TAKE 1 TABLET BY MOUTH  TWICE DAILY 180 tablet 3   furosemide (LASIX) 40 MG tablet Take 0.5 tablets (20 mg total) by mouth daily. Take additional 20mg  as needed for weight gain >2-3lbs overnight or >5lbs weekly (Patient taking differently: Take 40 mg by mouth daily. Take additional 20mg  as needed for weight gain >2-3lbs overnight or >5lbs weekly) 90 tablet 3   latanoprost (XALATAN) 0.005 % ophthalmic solution Place 1 drop into the left eye at bedtime.     Multiple Vitamins-Minerals (PRESERVISION AREDS 2+MULTI VIT PO) Take 1 capsule by mouth in the morning and at bedtime.     nitroGLYCERIN (NITROSTAT) 0.4 MG SL tablet Place 0.4 mg under the tongue every 5 (five) minutes as needed for  chest pain.     Omega-3 Fatty Acids (FISH OIL) 1000 MG CAPS Take 1,000 mg by mouth daily.     timolol (TIMOPTIC) 0.5 % ophthalmic solution Place 1 drop into the right eye 2 (two) times daily.     carvedilol (COREG) 12.5 MG tablet Take 1 tablet (12.5 mg total) by mouth 2 (two) times daily with a meal. (Patient not taking: Reported on 10/06/2022) 60 tablet 0   isosorbide mononitrate (IMDUR) 30 MG 24 hr tablet Take 1 tablet (30 mg total) by mouth at bedtime. (Patient not taking: Reported on 10/06/2022) 30 tablet 0   Current Facility-Administered Medications  Medication Dose Route Frequency Provider Last Rate Last Admin   betamethasone acetate-betamethasone sodium phosphate (CELESTONE) injection 3 mg  3 mg Intramuscular Once Felecia Shelling, DPM        Allergies:   Penicillins and Hydromorphone hcl   Social History: Social History   Socioeconomic History   Marital status: Widowed    Spouse name: Not on file   Number of  children: 3   Years of education: Not on file   Highest education level: Not on file  Occupational History   Occupation: retired    Associate Professor: [employer]  Tobacco Use   Smoking status: Former    Packs/day: 1.00    Years: 26.00    Total pack years: 26.00    Types: Cigarettes    Quit date: 10/31/1973    Years since quitting: 48.9   Smokeless tobacco: Never  Vaping Use   Vaping Use: Never used  Substance and Sexual Activity   Alcohol use: No   Drug use: No   Sexual activity: Not Currently  Other Topics Concern   Not on file  Social History Narrative   Not on file   Social Determinants of Health   Financial Resource Strain: Not on file  Food Insecurity: Not on file  Transportation Needs: Not on file  Physical Activity: Not on file  Stress: Not on file  Social Connections: Not on file  Intimate Partner Violence: Not on file    Family History: Family History  Problem Relation Age of Onset   CAD Brother 60   Stroke Brother    Cancer Other    Heart  disease Other    Heart attack Neg Hx    Hypertension Neg Hx      Review of Systems: All other systems reviewed and are otherwise negative except as noted above.  Physical Exam: Vitals:   10/06/22 0806  BP: (!) 124/58  Pulse: 85  SpO2: 97%  Weight: 191 lb (86.6 kg)  Height: 5\' 9"  (1.753 m)     GEN- The patient is well appearing, alert and oriented x 3 today.   HEENT: normocephalic, atraumatic; sclera clear, conjunctiva pink; hearing intact; oropharynx clear; neck supple, no JVP Lymph- no cervical lymphadenopathy Lungs- Clear to ausculation bilaterally, normal work of breathing.  No wheezes, rales, rhonchi Heart- Regular  rate and rhythm, no murmurs, rubs or gallops, PMI not laterally displaced GI- soft, non-tender, non-distended, bowel sounds present, no hepatosplenomegaly Extremities- no clubbing or cyanosis. No peripheral edema; DP/PT/radial pulses 2+ bilaterally MS- no significant deformity or atrophy Skin- warm and dry, no rash or lesion; PPM pocket well healed Psych- euthymic mood, full affect Neuro- strength and sensation are intact  PPM Interrogation-  reviewed in detail today,  See PACEART report.  EKG:  EKG is not ordered today.  Recent Labs: 01/11/2022: NT-Pro BNP 636 04/08/2022: ALT 12 04/13/2022: Magnesium 2.2 07/01/2022: BUN 31; Creatinine, Ser 2.01; Hemoglobin 13.6; Platelets 127; Potassium 4.3; Sodium 142   Wt Readings from Last 3 Encounters:  10/06/22 191 lb (86.6 kg)  09/05/22 189 lb 12.8 oz (86.1 kg)  07/06/22 190 lb 9.6 oz (86.5 kg)     Other studies Reviewed: Additional studies/ records that were reviewed today include: Previous EP office notes, Previous remote checks, Most recent labwork.   Assessment and Plan:  1. Second Degree AV block s/p Medtronic PPM  Normal PPM function See Pace Art report No changes today  2. PAF Asymptomatic Burden <1* on amiodarone Continue eliquis (adjusted) for CHA2DS2VASc  of at least 5 Surveillance labs today.    3. CAD Denies s/s ischemia  4. Chronic diastolic CHF EF 01/2021 EF 60% Volume status stable on exam  5. HTN Stable on current regimen   Current medicines are reviewed at length with the patient today.    Labs/ tests ordered today include:  No orders of the defined types were placed in this encounter.  Disposition:   Follow up with Dr. Lalla Brothers in 6 months   Signed, Graciella Freer, PA-C  10/06/2022 8:09 AM  [facility] [address] [place] [address] [phone] (office) [phone] (fax)

## 2022-10-06 ENCOUNTER — Encounter: Payer: Self-pay | Admitting: Student

## 2022-10-06 ENCOUNTER — Ambulatory Visit: Payer: Medicare Other | Attending: Student | Admitting: Student

## 2022-10-06 VITALS — BP 124/58 | HR 85 | Ht 69.0 in | Wt 191.0 lb

## 2022-10-06 DIAGNOSIS — Z950 Presence of cardiac pacemaker: Secondary | ICD-10-CM | POA: Diagnosis not present

## 2022-10-06 DIAGNOSIS — I5032 Chronic diastolic (congestive) heart failure: Secondary | ICD-10-CM

## 2022-10-06 DIAGNOSIS — I4430 Unspecified atrioventricular block: Secondary | ICD-10-CM

## 2022-10-06 DIAGNOSIS — I2510 Atherosclerotic heart disease of native coronary artery without angina pectoris: Secondary | ICD-10-CM | POA: Diagnosis not present

## 2022-10-06 DIAGNOSIS — Z952 Presence of prosthetic heart valve: Secondary | ICD-10-CM | POA: Diagnosis not present

## 2022-10-06 DIAGNOSIS — G4733 Obstructive sleep apnea (adult) (pediatric): Secondary | ICD-10-CM

## 2022-10-06 LAB — CUP PACEART INCLINIC DEVICE CHECK
Battery Impedance: 2755 Ohm
Battery Remaining Longevity: 24 mo
Battery Voltage: 2.73 V
Brady Statistic AP VP Percent: 0 %
Brady Statistic AP VS Percent: 89 %
Brady Statistic AS VP Percent: 0 %
Brady Statistic AS VS Percent: 10 %
Date Time Interrogation Session: 20231207082328
Implantable Lead Connection Status: 753985
Implantable Lead Connection Status: 753985
Implantable Lead Implant Date: 20111206
Implantable Lead Implant Date: 20111206
Implantable Lead Location: 753859
Implantable Lead Location: 753860
Implantable Lead Model: 5076
Implantable Lead Model: 5092
Implantable Pulse Generator Implant Date: 20111206
Lead Channel Impedance Value: 482 Ohm
Lead Channel Impedance Value: 761 Ohm
Lead Channel Pacing Threshold Amplitude: 1 V
Lead Channel Pacing Threshold Amplitude: 1.125 V
Lead Channel Pacing Threshold Amplitude: 1.25 V
Lead Channel Pacing Threshold Amplitude: 1.25 V
Lead Channel Pacing Threshold Pulse Width: 0.4 ms
Lead Channel Pacing Threshold Pulse Width: 0.4 ms
Lead Channel Pacing Threshold Pulse Width: 0.4 ms
Lead Channel Pacing Threshold Pulse Width: 0.4 ms
Lead Channel Sensing Intrinsic Amplitude: 15.67 mV
Lead Channel Sensing Intrinsic Amplitude: 2 mV
Lead Channel Setting Pacing Amplitude: 2.5 V
Lead Channel Setting Pacing Amplitude: 2.5 V
Lead Channel Setting Pacing Pulse Width: 0.4 ms
Lead Channel Setting Sensing Sensitivity: 5.6 mV
Zone Setting Status: 755011
Zone Setting Status: 755011

## 2022-10-06 NOTE — Patient Instructions (Addendum)
Medication Instructions:  Your physician recommends that you continue on your current medications as directed. Please refer to the Current Medication list given to you today.  *If you need a refill on your cardiac medications before your next appointment, please call your pharmacy*  Lab Work: You will have blood work drawn today:  CMET, TSH, Free T4, and CBC.    Testing/Procedures: None ordered.  Follow-Up: At [facility], you and your health needs are our priority.  As part of our continuing mission to provide you with exceptional heart care, we have created designated Provider Care Teams.  These Care Teams include your primary Cardiologist (physician) and Advanced Practice Providers (APPs -  Physician Assistants and Nurse Practitioners) who all work together to provide you with the care you need, when you need it.  We recommend signing up for the patient portal called "MyChart".  Sign up information is provided on this After Visit Summary.  MyChart is used to connect with patients for Virtual Visits (Telemedicine).  Patients are able to view lab/test results, encounter notes, upcoming appointments, etc.  Non-urgent messages can be sent to your provider as well.   To learn more about what you can do with MyChart, go to [email or web address].    Your next appointment:   Please schedule a 6 Month Follow up appointment with Dr. Lalla Brothers  The format for your next appointment:   In Person  Provider:   Casimiro Needle "[name]" Lanna Poche, PA-C  Remote monitoring is used to monitor your Pacemaker from home. This monitoring reduces the number of office visits required to check your device to one time per year. It allows [place] to keep an eye on the functioning of your device to ensure it is working properly. You are scheduled for a device check from home on 10/18/22. You may send your transmission at any time that day. If you have a wireless device, the transmission will be sent automatically. After your  physician reviews your transmission, you will receive a postcard with your next transmission date.  Important Information About [place]

## 2022-10-07 LAB — CBC WITH DIFFERENTIAL/PLATELET
Basophils Absolute: 0 10*3/uL (ref 0.0–0.2)
Basos: 1 %
EOS (ABSOLUTE): 0.1 10*3/uL (ref 0.0–0.4)
Eos: 2 %
Hematocrit: 40.1 % (ref 37.5–51.0)
Hemoglobin: 12.9 g/dL — ABNORMAL LOW (ref 13.0–17.7)
Immature Grans (Abs): 0 10*3/uL (ref 0.0–0.1)
Immature Granulocytes: 0 %
Lymphocytes Absolute: 1 10*3/uL (ref 0.7–3.1)
Lymphs: 20 %
MCH: 31.5 pg (ref 26.6–33.0)
MCHC: 32.2 g/dL (ref 31.5–35.7)
MCV: 98 fL — ABNORMAL HIGH (ref 79–97)
Monocytes Absolute: 0.5 10*3/uL (ref 0.1–0.9)
Monocytes: 9 %
Neutrophils Absolute: 3.5 10*3/uL (ref 1.4–7.0)
Neutrophils: 68 %
Platelets: 155 10*3/uL (ref 150–450)
RBC: 4.09 x10E6/uL — ABNORMAL LOW (ref 4.14–5.80)
RDW: 13.2 % (ref 11.6–15.4)
WBC: 5.2 10*3/uL (ref 3.4–10.8)

## 2022-10-07 LAB — COMPREHENSIVE METABOLIC PANEL
ALT: 15 IU/L (ref 0–44)
AST: 20 IU/L (ref 0–40)
Albumin/Globulin Ratio: 2.1 (ref 1.2–2.2)
Albumin: 4.2 g/dL (ref 3.6–4.6)
Alkaline Phosphatase: 81 IU/L (ref 44–121)
BUN/Creatinine Ratio: 8 — ABNORMAL LOW (ref 10–24)
BUN: 16 mg/dL (ref 10–36)
Bilirubin Total: 0.6 mg/dL (ref 0.0–1.2)
CO2: 26 mmol/L (ref 20–29)
Calcium: 9.2 mg/dL (ref 8.6–10.2)
Chloride: 102 mmol/L (ref 96–106)
Creatinine, Ser: 1.9 mg/dL — ABNORMAL HIGH (ref 0.76–1.27)
Globulin, Total: 2 g/dL (ref 1.5–4.5)
Glucose: 122 mg/dL — ABNORMAL HIGH (ref 70–99)
Potassium: 3.9 mmol/L (ref 3.5–5.2)
Sodium: 142 mmol/L (ref 134–144)
Total Protein: 6.2 g/dL (ref 6.0–8.5)
eGFR: 33 mL/min/{1.73_m2} — ABNORMAL LOW (ref >59)

## 2022-10-07 LAB — T4, FREE: Free T4: 1.67 ng/dL (ref 0.82–1.77)

## 2022-10-07 LAB — TSH: TSH: 4.11 u[IU]/mL (ref 0.450–4.500)

## 2022-11-13 ENCOUNTER — Other Ambulatory Visit: Payer: Self-pay | Admitting: Interventional Cardiology

## 2022-11-15 ENCOUNTER — Other Ambulatory Visit: Payer: Self-pay | Admitting: Interventional Cardiology

## 2022-11-22 ENCOUNTER — Ambulatory Visit (INDEPENDENT_AMBULATORY_CARE_PROVIDER_SITE_OTHER): Payer: Medicare Other

## 2022-11-22 ENCOUNTER — Encounter: Payer: Self-pay | Admitting: Podiatry

## 2022-11-22 ENCOUNTER — Ambulatory Visit (INDEPENDENT_AMBULATORY_CARE_PROVIDER_SITE_OTHER): Payer: Medicare Other | Admitting: Podiatry

## 2022-11-22 VITALS — BP 127/68 | HR 80

## 2022-11-22 DIAGNOSIS — M19071 Primary osteoarthritis, right ankle and foot: Secondary | ICD-10-CM

## 2022-11-22 DIAGNOSIS — M7751 Other enthesopathy of right foot: Secondary | ICD-10-CM

## 2022-11-22 MED ORDER — DEXAMETHASONE SODIUM PHOSPHATE 120 MG/30ML IJ SOLN: 4.0000 mg | Freq: Once | INTRAMUSCULAR | Status: DC

## 2022-11-22 NOTE — Progress Notes (Signed)
  Subjective:  Patient ID: Gregory Mccormick, male    DOB: 1929-06-22,   MRN: [id number]  Chief Complaint  Patient presents with   Ankle Pain    Patient is here for right ankle pain for 3 days.    89 y.o. male presents for concern of right ankle pain that has been going on for the past three days. Relates on Tuesday it started to bother him some but in last few days it has been very painful to move or walk on and tender to the touch on the ankle and top of the foot. Denies any treatments. Son has a history of gout. Relates may have had more purine inducing foods this weekend.   . Denies any other pedal complaints. Denies n/v/f/c.   Past Medical History:  Diagnosis Date   Anemia, unspecified    Arthritis    "right knee" (03/28/2013   CAD in native artery    Cardiomyopathy ([place])    EF 40%:  ECHO 11/25/2015 EF 55%-60%   Carpal tunnel syndrome, bilateral    Chronic diastolic (congestive) heart failure (HCC)    Chronic kidney disease (CKD), stage III (moderate) (HCC)    Epistaxis    Essential hypertension, benign    takes Metoprolol daily   H/O hiatal hernia    History of kidney stones    Hyperlipidemia    takes Lipitor daily   Mitral regurgitation    OSA on CPAP    wears CPAP 02/03/16   Pacemaker    MDT   PAF (paroxysmal atrial fibrillation) (HCC)    Paroxysmal ventricular tachycardia (HCC)    PONV (postoperative nausea and vomiting)    PVC's (premature ventricular contractions)    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 Burt Knack & Cyndia Bent.   Severe aortic stenosis    Vitamin D deficiency     Objective:  Physical Exam: Vascular: DP/PT pulses 2/4 bilateral. CFT <3 seconds. Normal hair growth on digits.  Skin. No lacerations or abrasions bilateral feet.  Musculoskeletal: MMT 5/5 bilateral lower extremities in DF, PF, Inversion and Eversion. Deceased ROM in DF of ankle joint.  Exquisitely tender to anterior ankle joint line and dorsum of right foot. Pain with ROM of the second and third TMTJ as well as ROM of the ankle. No pain with ROM of the STJ. Mild edema noted to the ankle.  Neurological: Sensation intact to light touch.   Assessment:   1. Capsulitis of right ankle   2. Arthritis of right midfoot      Plan:  Patient was evaluated and treated and all questions answered. -Xrays reviewed. Degenerative changes noted throughout midfoot and some in ankle.  -Discussed treatement options for gouty arthritis and gout education provided. -Patient opted for injection. After oral consent, injected right ankle and right third TMTJ with 1cc lidocaine and marcaine plain mixed with 0.5 Dexmethasone phosphate without complication; post injection care explained. -Discussed diet and modifications.  -ankle brace provided.  -Advised patient to call if symptoms are not improved within 1 week -Patient to return in 3 weeks for re-check/further discussion for long term management of gout or sooner if condition worsens.   Lorenda Peck, DPM

## 2022-12-27 ENCOUNTER — Encounter: Payer: Self-pay | Admitting: Podiatry

## 2022-12-27 ENCOUNTER — Ambulatory Visit (INDEPENDENT_AMBULATORY_CARE_PROVIDER_SITE_OTHER): Payer: Medicare Other | Admitting: Podiatry

## 2022-12-27 DIAGNOSIS — M109 Gout, unspecified: Secondary | ICD-10-CM

## 2022-12-27 DIAGNOSIS — M7751 Other enthesopathy of right foot: Secondary | ICD-10-CM

## 2022-12-27 NOTE — Progress Notes (Signed)
  Subjective:  Patient ID: Gregory Mccormick, male    DOB: Mar 25, 1929,   MRN: [id number]  Chief Complaint  Patient presents with   Plantar Fasciitis    Patient states Pf pain is better since the last visit , states 3 days after injection he felt some relief and could actually walk on his foot.     89 y.o. male presents for follow-up of right ankle capsulitis and possible gout. Relates he is doing much better without any pain. .   . Denies any other pedal complaints. Denies n/v/f/c.   Past Medical History:  Diagnosis Date   Anemia, unspecified    Arthritis    "right knee" (03/28/2013   CAD in native artery    Cardiomyopathy ([place])    EF 40%:  ECHO 11/25/2015 EF 55%-60%   Carpal tunnel syndrome, bilateral    Chronic diastolic (congestive) heart failure (HCC)    Chronic kidney disease (CKD), stage III (moderate) (HCC)    Epistaxis    Essential hypertension, benign    takes Metoprolol daily   H/O hiatal hernia    History of kidney stones    Hyperlipidemia    takes Lipitor daily   Mitral regurgitation    OSA on CPAP    wears CPAP 02/03/16   Pacemaker    MDT   PAF (paroxysmal atrial fibrillation) (HCC)    Paroxysmal ventricular tachycardia (HCC)    PONV (postoperative nausea and vomiting)    PVC's (premature ventricular contractions)    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 CBurt Knack& BCyndia Bent   Severe aortic stenosis    Vitamin D deficiency     Objective:  Physical Exam: Vascular: DP/PT pulses 2/4 bilateral. CFT <3 seconds. Normal hair growth on digits.  Skin. No lacerations or abrasions bilateral feet.  Musculoskeletal: MMT 5/5 bilateral lower extremities in DF, PF, Inversion and Eversion. Deceased ROM in DF of ankle joint. Exquisitely tender to anterior ankle joint line and dorsum of right foot. Pain with ROM of the second and third TMTJ as well as ROM of the ankle. No pain with  ROM of the STJ. Mild edema noted to the ankle.  Neurological: Sensation intact to light touch.   Assessment:   1. Capsulitis of right ankle   2. Gout of right foot, unspecified cause, unspecified chronicity       Plan:  Patient was evaluated and treated and all questions answered. -Xrays reviewed. Degenerative changes noted throughout midfoot and some in ankle.  -Discussed treatement options for gouty arthritis and gout education provided. -No injection needed today.  -Discussed diet and modifications.  -continue brace as needed.  -Patient to follow-up in the future if needed for future flares.    RLorenda Peck DPM

## 2023-01-13 ENCOUNTER — Encounter: Payer: Self-pay | Admitting: Cardiology

## 2023-01-13 ENCOUNTER — Telehealth: Payer: Self-pay | Admitting: Interventional Cardiology

## 2023-01-13 ENCOUNTER — Telehealth: Payer: Self-pay | Admitting: *Deleted

## 2023-01-13 NOTE — Telephone Encounter (Signed)
Paper Work Dropped Off: surgery clearance  [id number]   Location of paper:  [facility] office

## 2023-01-13 NOTE — Progress Notes (Signed)
[facility] DEVICE [facility]GRAMM[facility]  Patient Information: Name:  JOHNDANIEL MAJKOWSKI  DOB:  17-Mar-1929  MRN:  [id number]  Procedure:   B/L UPPER EYELID BLEPHAROPTOSIS REPAIR AND BLEPHAROPLASTY    Date of Surgery:  Clearance 03/06/23                                 Surgeon:  DR. Isidoro Donning  Surgeon's Group or Practice Name:  [facility]  Phone number:  [phone] Fax number:  [phone]  Device Information:  Clinic EP Physician:  Lars Mage, MD  Device Type:  Pacemaker Manufacturer and Phone #:  Medtronic: [id number] Pacemaker Dependent?:  No. Date of Last Device Check:  10/06/2022 Normal Device Function?:  Yes.    Electrophysiologist's Recommendations:  Have magnet available. Provide continuous ECG monitoring when magnet is used or reprogramming is to be performed.  Procedure should not interfere with device function.  No device programming or magnet placement needed.  Per Device Clinic Standing Orders, Damian Leavell, RN  1:05 PM 01/13/2023

## 2023-01-13 NOTE — Telephone Encounter (Signed)
   Name: Gregory Mccormick  DOB: 23-Sep-1929  MRN: [id number]  Primary Cardiologist: Larae Grooms, MD   Preoperative team, please contact this patient and set up a phone call appointment for further preoperative risk assessment. Please obtain consent and complete medication review. Thank you for your help.  I confirm that guidance regarding antiplatelet and oral anticoagulation therapy has been completed and, if necessary, noted below.  Per office protocol, patient can hold Eliquis for 2 days prior to procedure.     Lenna Sciara, NP 01/13/2023, 1:57 PM [place]

## 2023-01-13 NOTE — Telephone Encounter (Signed)
Pt has been scheduled for a tele pre op appt 02/03/23 @ 10 am. Med rec and consent are done.

## 2023-01-13 NOTE — Telephone Encounter (Signed)
Patient with diagnosis of afib on Eliquis for anticoagulation.    Procedure: bilateral upper eyelid blepharoptosis repair and blepharoplasty Date of procedure: 03/06/23  CHA2DS2-VASc Score = 5  This indicates a 7.2% annual risk of stroke. The patient's score is based upon: CHF History: 1 HTN History: 1 Diabetes History: 0 Stroke History: 0 Vascular Disease History: 1 Age Score: 2 Gender Score: 0   TAVR 03/2022  CrCl 65mL/min Platelet count 155K  Per office protocol, patient can hold Eliquis for 2 days prior to procedure.    **This guidance is not considered finalized until pre-operative APP has relayed final recommendations.**

## 2023-01-13 NOTE — Telephone Encounter (Signed)
Clearance sent as requested for device.

## 2023-01-13 NOTE — Telephone Encounter (Signed)
Pt has been scheduled for a tele pre op appt 02/03/23 @ 10 am. Med rec and consent are done.      Patient Consent for Virtual Visit        Gregory Mccormick has provided verbal consent on 01/13/2023 for a virtual visit (video or telephone).   CONSENT FOR VIRTUAL VISIT FOR:  Gregory Mccormick  By participating in this virtual visit I agree to the following:  I hereby voluntarily request, consent and authorize [facility] and its employed or contracted physicians, physician assistants, nurse practitioners or other licensed health care professionals (the Practitioner), to provide me with telemedicine health care services (the "Services") as deemed necessary by the treating Practitioner. I acknowledge and consent to receive the Services by the Practitioner via telemedicine. I understand that the telemedicine visit will involve communicating with the Practitioner through live audiovisual communication technology and the disclosure of certain medical information by electronic transmission. I acknowledge that I have been given the opportunity to request an in-person assessment or other available alternative prior to the telemedicine visit and am voluntarily participating in the telemedicine visit.  I understand that I have the right to withhold or withdraw my consent to the use of telemedicine in the course of my care at any time, without affecting my right to future care or treatment, and that the Practitioner or I may terminate the telemedicine visit at any time. I understand that I have the right to inspect all information obtained and/or recorded in the course of the telemedicine visit and may receive copies of available information for a reasonable fee.  I understand that some of the potential risks of receiving the Services via telemedicine include:  Delay or interruption in medical evaluation due to technological equipment failure or disruption; Information transmitted may not be sufficient (e.g.  poor resolution of images) to allow for appropriate medical decision making by the Practitioner; and/or  In rare instances, security protocols could fail, causing a breach of personal health information.  Furthermore, I acknowledge that it is my responsibility to provide information about my medical history, conditions and care that is complete and accurate to the best of my ability. I acknowledge that Practitioner's advice, recommendations, and/or decision may be based on factors not within their control, such as incomplete or inaccurate data provided by me or distortions of diagnostic images or specimens that may result from electronic transmissions. I understand that the practice of medicine is not an exact science and that Practitioner makes no warranties or guarantees regarding treatment outcomes. I acknowledge that a copy of this consent can be made available to me via my patient portal ([facility]), or I can request a printed copy by calling the office of [facility].    I understand that my insurance will be billed for this visit.   I have read or had this consent read to me. I understand the contents of this consent, which adequately explains the benefits and risks of the Services being provided via telemedicine.  I have been provided ample opportunity to ask questions regarding this consent and the Services and have had my questions answered to my satisfaction. I give my informed consent for the services to be provided through the use of telemedicine in my medical care

## 2023-01-13 NOTE — Telephone Encounter (Signed)
   Pre-operative Risk Assessment    Patient Name: Gregory Mccormick  DOB: December 07, 1928 MRN: [id number]      Request for Surgical Clearance    Procedure:   B/L UPPER EYELID BLEPHAROPTOSIS REPAIR AND BLEPHAROPLASTY   Date of Surgery:  Clearance 03/06/23                                 Surgeon:  DR. Isidoro Donning  Surgeon's Group or Practice Name:  [id number]  Phone number:  [phone] Fax number:  [phone]   Type of Clearance Requested:   - Medical  - Pharmacy:  Hold Apixaban (Eliquis) ALSO NEEDS PACEMAKER CLEARANCE   Type of Anesthesia:  MAC   Additional requests/questions:    Jiles Prows   01/13/2023, 12:11 PM

## 2023-01-18 ENCOUNTER — Telehealth: Payer: Self-pay

## 2023-01-18 NOTE — Telephone Encounter (Signed)
I ordered the patient a new monitor. He should receive it in 7-10 business days.

## 2023-01-31 ENCOUNTER — Telehealth: Payer: Self-pay | Admitting: Interventional Cardiology

## 2023-01-31 ENCOUNTER — Other Ambulatory Visit: Payer: Self-pay

## 2023-01-31 ENCOUNTER — Encounter (HOSPITAL_COMMUNITY): Payer: Self-pay

## 2023-01-31 ENCOUNTER — Emergency Department (HOSPITAL_COMMUNITY): Payer: Medicare Other

## 2023-01-31 ENCOUNTER — Emergency Department (HOSPITAL_COMMUNITY)
Admission: EM | Admit: 2023-01-31 | Discharge: 2023-01-31 | Disposition: A | Discharge status: Home / Self Care | Payer: Medicare Other | Attending: Emergency Medicine | Admitting: Emergency Medicine

## 2023-01-31 DIAGNOSIS — R0602 Shortness of breath: Secondary | ICD-10-CM | POA: Insufficient documentation

## 2023-01-31 DIAGNOSIS — Z1152 Encounter for screening for COVID-19: Secondary | ICD-10-CM | POA: Diagnosis not present

## 2023-01-31 DIAGNOSIS — J40 Bronchitis, not specified as acute or chronic: Secondary | ICD-10-CM | POA: Diagnosis not present

## 2023-01-31 DIAGNOSIS — Z7901 Long term (current) use of anticoagulants: Secondary | ICD-10-CM | POA: Insufficient documentation

## 2023-01-31 DIAGNOSIS — I2510 Atherosclerotic heart disease of native coronary artery without angina pectoris: Secondary | ICD-10-CM | POA: Insufficient documentation

## 2023-01-31 DIAGNOSIS — R059 Cough, unspecified: Secondary | ICD-10-CM | POA: Diagnosis present

## 2023-01-31 LAB — CBC WITH DIFFERENTIAL/PLATELET
Abs Immature Granulocytes: 0.02 10*3/uL (ref 0.00–0.07)
Basophils Absolute: 0 10*3/uL (ref 0.0–0.1)
Basophils Relative: 0 %
Eosinophils Absolute: 0.1 10*3/uL (ref 0.0–0.5)
Eosinophils Relative: 2 %
HCT: 40.4 % (ref 39.0–52.0)
Hemoglobin: 13 g/dL (ref 13.0–17.0)
Immature Granulocytes: 0 %
Lymphocytes Relative: 14 %
Lymphs Abs: 0.7 10*3/uL (ref 0.7–4.0)
MCH: 31.5 pg (ref 26.0–34.0)
MCHC: 32.2 g/dL (ref 30.0–36.0)
MCV: 97.8 fL (ref 80.0–100.0)
Monocytes Absolute: 0.3 10*3/uL (ref 0.1–1.0)
Monocytes Relative: 5 %
Neutro Abs: 3.7 10*3/uL (ref 1.7–7.7)
Neutrophils Relative %: 79 %
Platelets: 121 10*3/uL — ABNORMAL LOW (ref 150–400)
RBC: 4.13 MIL/uL — ABNORMAL LOW (ref 4.22–5.81)
RDW: 14.4 % (ref 11.5–15.5)
WBC: 4.8 10*3/uL (ref 4.0–10.5)
nRBC: 0 % (ref 0.0–0.2)

## 2023-01-31 LAB — COMPREHENSIVE METABOLIC PANEL
ALT: 11 U/L (ref 0–44)
AST: 20 U/L (ref 15–41)
Albumin: 3.3 g/dL — ABNORMAL LOW (ref 3.5–5.0)
Alkaline Phosphatase: 81 U/L (ref 38–126)
Anion gap: 8 (ref 5–15)
BUN: 14 mg/dL (ref 8–23)
CO2: 26 mmol/L (ref 22–32)
Calcium: 8.5 mg/dL — ABNORMAL LOW (ref 8.9–10.3)
Chloride: 102 mmol/L (ref 98–111)
Creatinine, Ser: 1.38 mg/dL — ABNORMAL HIGH (ref 0.61–1.24)
GFR, Estimated: 48 mL/min — ABNORMAL LOW (ref >60)
Glucose, Bld: 109 mg/dL — ABNORMAL HIGH (ref 70–99)
Potassium: 3.6 mmol/L (ref 3.5–5.1)
Sodium: 136 mmol/L (ref 135–145)
Total Bilirubin: 0.9 mg/dL (ref 0.3–1.2)
Total Protein: 6.2 g/dL — ABNORMAL LOW (ref 6.5–8.1)

## 2023-01-31 LAB — RESP PANEL BY RT-PCR (RSV, FLU A&B, COVID)  RVPGX2
Influenza A by PCR: NEGATIVE
Influenza B by PCR: NEGATIVE
Resp Syncytial Virus by PCR: NEGATIVE
SARS Coronavirus 2 by RT PCR: NEGATIVE

## 2023-01-31 MED ORDER — DOXYCYCLINE HYCLATE 100 MG PO CAPS: 100.0000 mg | ORAL_CAPSULE | Freq: Two times a day (BID) | ORAL | 0 refills | Status: DC

## 2023-01-31 MED ORDER — ALBUTEROL SULFATE HFA 108 (90 BASE) MCG/ACT IN AERS: 2.0000 | INHALATION_SPRAY | Freq: Four times a day (QID) | RESPIRATORY_TRACT | 0 refills | Status: DC | PRN

## 2023-01-31 MED ORDER — MAGNESIUM SULFATE 2 GM/50ML IV SOLN
2.0000 g | Freq: Once | INTRAVENOUS | Status: AC
  Administered 2023-01-31: 2 g via INTRAVENOUS
  Filled 2023-01-31: qty 50

## 2023-01-31 NOTE — Telephone Encounter (Signed)
Calling to let our office know, the patient is in the hospital. Please advise

## 2023-01-31 NOTE — ED Triage Notes (Signed)
Pt brought in by EMS from home after calling d/t SOB/cough, pt found to be in the 80's on RA and bilateral wheezing. Placed on 2L [employer] by EMS, given 5mg  albuterol and 125mg  IVP en route  Pt reports feeling a little winded on and 'off' since yesterday.  BG 109

## 2023-01-31 NOTE — ED Provider Notes (Signed)
[facility] Provider Note   CSN: [id number] Arrival date & time: 01/31/23  [id number]     History {Add pertinent medical, surgical, social history, OB history to HPI:1} Chief Complaint  Patient presents with   Shortness of Breath   Cough    Gregory Mccormick is a 89 y.o. male.  Patient has a history of coronary artery disease.  Patient states he has had a cough for a number days.  He has had yellow sputum production.  Today he was short of breath and coughing.  Paramedics came to see him and gave him a neb treatment and he improved   Shortness of Breath Associated symptoms: cough   Cough Associated symptoms: shortness of breath        Home Medications Prior to Admission medications   Medication Sig Start Date End Date Taking? Authorizing Provider  albuterol (VENTOLIN HFA) 108 (90 Base) MCG/ACT inhaler Inhale 2 puffs into the lungs every 6 (six) hours as needed for wheezing or shortness of breath. 01/31/23  Yes Milton Ferguson, MD  amiodarone (PACERONE) 200 MG tablet TAKE 1 TABLET BY MOUTH DAILY 02/15/22   Jettie Booze, MD  atorvastatin (LIPITOR) 10 MG tablet TAKE 1 TABLET BY MOUTH DAILY AT  6 PM. 11/16/22   Jettie Booze, MD  carvedilol (COREG) 12.5 MG tablet Take 1 tablet (12.5 mg total) by mouth 2 (two) times daily with a meal. 07/01/22   Blanchie Dessert, MD  cephALEXin (KEFLEX) 500 MG capsule Take 4 capsules by mouth 30-60 min prior to dental appointment. 04/27/22   Eileen Stanford, PA-C  doxycycline (VIBRAMYCIN) 100 MG capsule Take 1 capsule (100 mg total) by mouth 2 (two) times daily. One po bid x 7 days 01/31/23   Milton Ferguson, MD  ELIQUIS 2.5 MG TABS tablet TAKE 1 TABLET BY MOUTH TWICE  DAILY 05/16/22   Jettie Booze, MD  ENTRESTO 24-26 MG TAKE 1 TABLET BY MOUTH  TWICE DAILY 02/28/22   Early Osmond, MD  furosemide (LASIX) 40 MG tablet TAKE 1 TABLET BY MOUTH DAILY 11/14/22   Jettie Booze, MD  isosorbide  mononitrate (IMDUR) 30 MG 24 hr tablet Take 1 tablet (30 mg total) by mouth at bedtime. 07/01/22   Blanchie Dessert, MD  latanoprost (XALATAN) 0.005 % ophthalmic solution Place 1 drop into the left eye at bedtime. 11/28/20   [provider]  Multiple Vitamins-Minerals (PRESERVISION AREDS 2+MULTI VIT PO) Take 1 capsule by mouth in the morning and at bedtime.    [provider]  nitroGLYCERIN (NITROSTAT) 0.4 MG SL tablet Place 0.4 mg under the tongue every 5 (five) minutes as needed for chest pain.    [provider]  Omega-3 Fatty Acids (FISH OIL) 1000 MG CAPS Take 1,000 mg by mouth daily.    [provider]  timolol (TIMOPTIC) 0.5 % ophthalmic solution Place 1 drop into the right eye 2 (two) times daily. 01/19/22   [provider]      Allergies    Penicillins and Hydromorphone hcl    Review of Systems   Review of Systems  Respiratory:  Positive for cough and shortness of breath.     Physical Exam Updated Vital Signs BP (!) 158/84   Pulse 62   Temp 98.4 F (36.9 C) (Oral)   Resp 20   Ht 5\' 9"  (1.753 m)   Wt 84.4 kg   SpO2 97%   BMI 27.47 kg/m  Physical Exam  ED Results / Procedures / Treatments   Labs (all labs ordered are listed, but only abnormal results are displayed) Labs Reviewed  CBC WITH DIFFERENTIAL/PLATELET - Abnormal; Notable for the following components:      Result Value   RBC 4.13 (*)    Platelets 121 (*)    All other components within normal limits  COMPREHENSIVE METABOLIC PANEL - Abnormal; Notable for the following components:   Glucose, Bld 109 (*)    Creatinine, Ser 1.38 (*)    Calcium 8.5 (*)    Total Protein 6.2 (*)    Albumin 3.3 (*)    GFR, Estimated 48 (*)    All other components within normal limits  RESP PANEL BY RT-PCR (RSV, FLU A&B, COVID)  RVPGX2    EKG None  Radiology DG Chest Port 1 View  Result Date: 01/31/2023 CLINICAL DATA:  Shortness of breath EXAM: PORTABLE CHEST 1 VIEW COMPARISON:   Previous studies including the examination of 07/01/2022 FINDINGS: Transverse diameter of heart is increased. There is prosthetic aortic valve. There are no signs of pulmonary edema. No new focal infiltrates are seen. There is no significant pleural effusion or pneumothorax. Pacemaker battery is seen in the left infraclavicular region. IMPRESSION: Cardiomegaly. There are no signs of pulmonary edema or focal pulmonary consolidation. Electronically Signed   By: Elmer Picker M.D.   On: 01/31/2023 10:20    Procedures Procedures  {Document cardiac monitor, telemetry assessment procedure when appropriate:1}  Medications Ordered in ED Medications  magnesium sulfate IVPB 2 g 50 mL (0 g Intravenous Stopped 01/31/23 1119)    ED Course/ Medical Decision Making/ A&P   {   Click here for ABCD2, HEART and other calculatorsREFRESH Note before signing :1}                          Medical Decision Making Amount and/or Complexity of Data Reviewed Labs: ordered. Radiology: ordered.  Risk Prescription drug management.   Patient with bronchitis and bronchospasm.  He is given an albuterol [place]er and doxycycline and will follow-up with PCP  {Document critical care time when appropriate:1} {Document review of labs and clinical decision tools ie heart score, Chads2Vasc2 etc:1}  {Document your independent review of radiology images, and any outside records:1} {Document your discussion with family members, caretakers, and with consultants:1} {Document social determinants of health affecting pt's care:1} {Document your decision making why or why not admission, treatments were needed:1} Final Clinical Impression(s) / ED Diagnoses Final diagnoses:  Bronchitis    Rx / DC Orders ED Discharge Orders          Ordered    doxycycline (VIBRAMYCIN) 100 MG capsule  2 times daily,   Status:  Discontinued        01/31/23 1328    doxycycline (VIBRAMYCIN) 100 MG capsule  2 times daily        01/31/23 1329     albuterol (VENTOLIN HFA) 108 (90 Base) MCG/ACT inhaler  Every 6 hours PRN        01/31/23 1329

## 2023-01-31 NOTE — Discharge Instructions (Signed)
Follow-up with your family doctor in a week.  Return sooner if any problems

## 2023-02-03 ENCOUNTER — Ambulatory Visit: Payer: Medicare Other | Attending: Cardiovascular Disease

## 2023-02-03 DIAGNOSIS — Z01810 Encounter for preprocedural cardiovascular examination: Secondary | ICD-10-CM | POA: Diagnosis not present

## 2023-02-03 NOTE — Progress Notes (Signed)
Virtual Visit via Telephone Note   Because of Gregory Mccormick's co-morbid illnesses, he is at least at moderate risk for complications without adequate follow up.  This format is felt to be most appropriate for this patient at this time.  The patient did not have access to video technology/had technical difficulties with video requiring transitioning to audio format only (telephone).  All issues noted in this document were discussed and addressed.  No physical exam could be performed with this format.  Please refer to the patient's chart for his consent to telehealth for [facility].  Evaluation Performed:  Preoperative cardiovascular risk assessment _____________   Date:  02/03/2023   Patient ID:  Gregory Mccormick, DOB August 16, 1929, MRN [id number] Patient Location:  Home Provider location:   Office  Primary Care Provider:  Tally Joe, MD Primary Cardiologist:  Lance Muss, MD  Chief Complaint / Patient Profile   89 y.o. y/o male with a h/o coronary artery disease, status post TAVR, paroxysmal atrial fibrillation, HTN who is pending B/L UPPER EYELID BLEPHAROPTOSIS REPAIR AND BLEPHAROPLASTY  and presents today for telephonic preoperative cardiovascular risk assessment.  History of Present Illness    Gregory Mccormick is a 89 y.o. male who presents via audio/video conferencing for a telehealth visit today.  Pt was last seen in cardiology clinic on 09/05/2022 by Dr.Varanasi.  At that time Gregory Mccormick was doing well .  The patient is now pending procedure as outlined above. Since his last visit, he continues to be stable from a cardiac standpoint.  Today he denies chest pain, lower extremity edema, fatigue, palpitations, melena, hematuria, hemoptysis, diaphoresis, weakness, presyncope, syncope, orthopnea, and PND.   Past Medical History    Past Medical History:  Diagnosis Date   Anemia, unspecified    Arthritis    "right knee" (03/28/2013   CAD in native artery     Cardiomyopathy    EF 40%:  ECHO 11/25/2015 EF 55%-60%   Carpal tunnel syndrome, bilateral    Chronic diastolic (congestive) heart failure    Chronic kidney disease (CKD), stage III (moderate)    Epistaxis    Essential hypertension, benign    takes Metoprolol daily   H/O hiatal hernia    History of kidney stones    Hyperlipidemia    takes Lipitor daily   Mitral regurgitation    OSA on CPAP    wears CPAP 02/03/16   Pacemaker    MDT   PAF (paroxysmal atrial fibrillation)    Paroxysmal ventricular tachycardia    PONV (postoperative nausea and vomiting)    PVC's (premature ventricular contractions)    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 Excell Seltzer & Laneta Simmers.   Severe aortic stenosis    Vitamin D deficiency    Past Surgical History:  Procedure Laterality Date   CARDIAC CATHETERIZATION  03/21/2013   CARDIOVERSION N/A 10/09/2018   Procedure: CARDIOVERSION;  Surgeon: Wendall Stade, MD;  Location: [facility] ENDOSCOPY;  Service: Cardiovascular;  Laterality: N/A;   CARPAL TUNNEL RELEASE Right 02/05/2016   Procedure: RIGHT LIMITED OPEN CARPAL TUNNEL RELEASE;  Surgeon: Dominica Severin, MD;  Location: [facility] OR;  Service: Orthopedics;  Laterality: Right;   CARPAL TUNNEL RELEASE Left 04/21/2016   Procedure: CARPAL TUNNEL RELEASE;  Surgeon: Dominica Severin, MD;  Location: [facility] OR;  Service: Orthopedics;  Laterality: Left;   CATARACT EXTRACTION W/ INTRAOCULAR LENS  IMPLANT, BILATERAL Bilateral 2000's   COLONOSCOPY     CORONARY ANGIOPLASTY WITH STENT PLACEMENT  03/28/2013   "3 stents" (03/28/2013)   HIP ARTHROPLASTY Right 04/30/2019   Procedure: ARTHROPLASTY BIPOLAR HIP (HEMIARTHROPLASTY);  Surgeon: Durene Romanslin, Matthew, MD;  Location: [facility] ORS;  Service: Orthopedics;  Laterality: Right;   INSERT / REPLACE / REMOVE PACEMAKER     INTRAOPERATIVE TRANSTHORACIC ECHOCARDIOGRAM N/A 04/12/2022   Procedure: INTRAOPERATIVE  TRANSTHORACIC ECHOCARDIOGRAM;  Surgeon: Tonny Bollmanooper, Michael, MD;  [facility];  Service: Open Heart Surgery;  Laterality: N/A;   KNEE CARTILAGE SURGERY Left 1972   "cut it open" (03/28/2013)   KNEE CARTILAGE SURGERY Right ~ 1977   "cut it open" (03/28/2013)   PACEMAKER INSERTION  10/05/10   MDT Adapta L implanted by Dr Johney FrameAllred for mobitz II second degree AV block   PERCUTANEOUS CORONARY ROTOBLATOR INTERVENTION (PCI-R) N/A 03/28/2013   Procedure: PERCUTANEOUS CORONARY ROTOBLATOR INTERVENTION (PCI-R);  Surgeon: Corky CraftsJayadeep S Varanasi, MD;  [facility];  Service: Cardiovascular;  Laterality: N/A;   RIGHT/LEFT HEART CATH AND CORONARY ANGIOGRAPHY N/A 01/27/2022   Procedure: RIGHT/LEFT HEART CATH AND CORONARY ANGIOGRAPHY;  Surgeon: Corky CraftsVaranasi, Jayadeep S, MD;  [facility];  Service: Cardiovascular;  Laterality: N/A;   SEPTOPLASTY N/A 02/05/2016   Procedure: SEPTOPLASTY WITH INTRANASAL SKIN GRAFT;  Surgeon: Drema Halonhristopher E Newman, MD;  Location: [facility] OR;  Service: ENT;  Laterality: N/A;   SHOULDER HEMI-ARTHROPLASTY Right 1987   "got hit by sled & dragged down sheet > 200 feet; tore shoulder up" (03/28/2013)   SHOULDER SURGERY Right 1970's   "pulled muscle apart; sewed it back together" (03/28/2013   TOTAL KNEE ARTHROPLASTY Right 2007   TOTAL KNEE ARTHROPLASTY  11/26/2012   Procedure: TOTAL KNEE ARTHROPLASTY;  Surgeon: Loanne DrillingFrank V Aluisio, MD;  Location: [facility] ORS;  Service: Orthopedics;  Laterality: Left;   TRANSCATHETER AORTIC VALVE REPLACEMENT, TRANSFEMORAL N/A 04/12/2022   Procedure: Transcatheter Aortic Valve Replacement, Transfemoral;  Surgeon: Tonny Bollmanooper, Michael, MD;  [facility];  Service: Open Heart Surgery;  Laterality: N/A;   TRANSURETHRAL RESECTION OF PROSTATE N/A 12/27/2017   Procedure: TRANSURETHRAL RESECTION OF THE PROSTATE (TURP);  Surgeon: Crista ElliotBell, Eugene D III, MD;  [facility];  Service: Urology;  Laterality: N/A;    Allergies  Allergies  Allergen  Reactions   Penicillins Hives    Has tolerated Keflex      Has patient had a PCN reaction causing immediate rash, facial/tongue/throat swelling, SOB or lightheadedness with hypotension: YES Has patient had a PCN reaction causing severe rash involving mucus membranes or skin necrosis: No Has patient had a PCN reaction that required hospitalization No Has patient had a PCN reaction occurring within the last 10 years: No If all of the above answers are "NO", then may proceed with Cephalosporin use.    Hydromorphone Hcl Nausea And Vomiting    Home Medications    Prior to Admission medications   Medication Sig Start Date End Date Taking? Authorizing Provider  albuterol (VENTOLIN HFA) 108 (90 Base) MCG/ACT inhaler Inhale 2 puffs into the lungs every 6 (six) hours as needed for wheezing or shortness of breath. 01/31/23   Bethann BerkshireZammit, Joseph, MD  amiodarone (PACERONE) 200 MG tablet TAKE 1 TABLET BY MOUTH DAILY 02/15/22   Corky CraftsVaranasi, Jayadeep S, MD  atorvastatin (LIPITOR) 10 MG tablet TAKE 1 TABLET BY MOUTH DAILY AT  6 PM. 11/16/22   Corky CraftsVaranasi, Jayadeep S, MD  carvedilol (COREG) 12.5 MG tablet Take 1 tablet (12.5 mg total) by  mouth 2 (two) times daily with a meal. 07/01/22   Gwyneth Sprout, MD  cephALEXin (KEFLEX) 500 MG capsule Take 4 capsules by mouth 30-60 min prior to dental appointment. 04/27/22   Janetta Hora, PA-C  doxycycline (VIBRAMYCIN) 100 MG capsule Take 1 capsule (100 mg total) by mouth 2 (two) times daily. One po bid x 7 days 01/31/23   Bethann Berkshire, MD  ELIQUIS 2.5 MG TABS tablet TAKE 1 TABLET BY MOUTH TWICE  DAILY 05/16/22   Corky Crafts, MD  ENTRESTO 24-26 MG TAKE 1 TABLET BY MOUTH  TWICE DAILY 02/28/22   [name], MD  furosemide (LASIX) 40 MG tablet TAKE 1 TABLET BY MOUTH DAILY 11/14/22   Corky Crafts, MD  isosorbide mononitrate (IMDUR) 30 MG 24 hr tablet Take 1 tablet (30 mg total) by mouth at bedtime. 07/01/22   Gwyneth Sprout, MD  latanoprost (XALATAN) 0.005 %  ophthalmic solution Place 1 drop into the left eye at bedtime. 11/28/20   [provider]  Multiple Vitamins-Minerals (PRESERVISION AREDS 2+MULTI VIT PO) Take 1 capsule by mouth in the morning and at bedtime.    [provider]  nitroGLYCERIN (NITROSTAT) 0.4 MG SL tablet Place 0.4 mg under the tongue every 5 (five) minutes as needed for chest pain.    [provider]  Omega-3 Fatty Acids (FISH OIL) 1000 MG CAPS Take 1,000 mg by mouth daily.    [provider]  timolol (TIMOPTIC) 0.5 % ophthalmic solution Place 1 drop into the right eye 2 (two) times daily. 01/19/22   [provider]    Physical Exam    Vital Signs:  Gregory Mccormick does not have vital signs available for review today.  Given telephonic nature of communication, physical exam is limited. AAOx3. NAD. Normal affect.  Speech and respirations are unlabored.  Accessory Clinical Findings    None  Assessment & Plan    1.  Preoperative Cardiovascular Risk Assessment: B/L UPPER EYELID BLEPHAROPTOSIS REPAIR AND BLEPHAROPLASTY ,  DR. Shawna Orleans ,  [facility]       Primary Cardiologist: Lance Muss, MD  Chart reviewed as part of pre-operative protocol coverage. Given past medical history and time since last visit, based on ACC/AHA guidelines, Gregory Mccormick would be at acceptable risk for the planned procedure without further cardiovascular testing.   Per office protocol, patient can hold Eliquis for 2 days prior to procedure   Patient was advised that if he develops new symptoms prior to surgery to contact our office to arrange a follow-up appointment.  He verbalized understanding.  I will route this recommendation to the requesting party via Epic fax function and remove from pre-op pool.       Time:   Today, I have spent 5 minutes with the patient with telehealth technology discussing medical history, symptoms, and management plan.  Prior to his phone evaluation I  spent greater than 10 minutes reviewing his past medical history and cardiac medications.   Ronney Asters, NP  02/03/2023, 7:32 AM

## 2023-02-21 ENCOUNTER — Ambulatory Visit (INDEPENDENT_AMBULATORY_CARE_PROVIDER_SITE_OTHER): Payer: Medicare Other

## 2023-02-21 ENCOUNTER — Telehealth: Payer: Self-pay | Admitting: Interventional Cardiology

## 2023-02-21 DIAGNOSIS — I4430 Unspecified atrioventricular block: Secondary | ICD-10-CM

## 2023-02-21 NOTE — Telephone Encounter (Signed)
147/78, 82 Patient reporting: I don't feel good.  I'm dizzy headed and have to use cane or hold on to wall. He has been tired all day.  Was planning to mow grass today but woke up not great - then after breakfast he had plans to go see his son and decided to not drive.  No chest pain or shortness of breath. Feels equilibrium is off.    Discussed w device nurse. He tried to send a transmission but he has a Teacher, music - all different equipment.  He tried to send but it was not getting through.  Device team will reach out to patient tomorrow.  They tried to set up a remote transmission to see if it will come through.    I adv the patient to call 911 if he gets worse in any way.  His sons also live close by and he can call them.   He denies any upper respiratory or sinus or ear pressure or pain.  I asked the patient to plan to contact PCP early in the morning and adv that our office will reach back out to him tomorrow as well.

## 2023-02-21 NOTE — Telephone Encounter (Signed)
STAT if patient feels like he/she is going to faint   Are you dizzy now?   Yes  Do you feel faint or have you passed out?   No  Do you have any other symptoms?     Have you checked your HR and BP (record if available)?   Not yet  Patient stated he has been feeling very dizzy all day.  Patient stated he has been feeling tired and that is not his normal way.  Patient stated it is hard for him to walk straight when he gets up out of his chair and he needs to use his cane.  Oxygen level was 91 HR 89 about 30 minutes ago.

## 2023-02-22 LAB — CUP PACEART REMOTE DEVICE CHECK
Battery Impedance: 2824 Ohm
Battery Remaining Longevity: 25 mo
Battery Voltage: 2.73 V
Brady Statistic AP VP Percent: 0 %
Brady Statistic AP VS Percent: 78 %
Brady Statistic AS VP Percent: 0 %
Brady Statistic AS VS Percent: 22 %
Date Time Interrogation Session: 20240424090959
Implantable Lead Connection Status: 753985
Implantable Lead Connection Status: 753985
Implantable Lead Implant Date: 20111206
Implantable Lead Implant Date: 20111206
Implantable Lead Location: 753859
Implantable Lead Location: 753860
Implantable Lead Model: 5076
Implantable Lead Model: 5092
Implantable Pulse Generator Implant Date: 20111206
Lead Channel Impedance Value: 502 Ohm
Lead Channel Impedance Value: 862 Ohm
Lead Channel Pacing Threshold Amplitude: 0.875 V
Lead Channel Pacing Threshold Amplitude: 1 V
Lead Channel Pacing Threshold Pulse Width: 0.4 ms
Lead Channel Pacing Threshold Pulse Width: 0.4 ms
Lead Channel Setting Pacing Amplitude: 2 V
Lead Channel Setting Pacing Amplitude: 2.5 V
Lead Channel Setting Pacing Pulse Width: 0.4 ms
Lead Channel Setting Sensing Sensitivity: 5.6 mV
Zone Setting Status: 755011
Zone Setting Status: 755011

## 2023-02-22 NOTE — Telephone Encounter (Signed)
Called patient to send manual transmission. States he will send in a few minutes. Will check back.  Patient also reports feeling the same with no improvement.

## 2023-02-22 NOTE — Telephone Encounter (Signed)
Patient is in normal rhythm and 3 AT events noted on 01/20/23, all <30 minutes. No events noted to correlate with current symptoms. Routing to [place], [place] to advise.

## 2023-02-22 NOTE — Telephone Encounter (Signed)
I called the patient-no answer & no voicemail.

## 2023-02-22 NOTE — Telephone Encounter (Signed)
Transmission received 02/22/2023.

## 2023-02-23 NOTE — Telephone Encounter (Signed)
Call placed to patient to see how he is feeling.  Call was answered but then disconnected before I was able to speak with patient.  Another call placed to patient.  Someone answered and said "I will call you when we are done."  Will await return call

## 2023-03-03 ENCOUNTER — Ambulatory Visit (INDEPENDENT_AMBULATORY_CARE_PROVIDER_SITE_OTHER): Payer: Medicare Other

## 2023-03-03 ENCOUNTER — Ambulatory Visit (INDEPENDENT_AMBULATORY_CARE_PROVIDER_SITE_OTHER): Payer: Medicare Other | Admitting: Podiatry

## 2023-03-03 DIAGNOSIS — M19071 Primary osteoarthritis, right ankle and foot: Secondary | ICD-10-CM

## 2023-03-03 DIAGNOSIS — M79671 Pain in right foot: Secondary | ICD-10-CM

## 2023-03-03 MED ORDER — BETAMETHASONE SOD PHOS & ACET 6 (3-3) MG/ML IJ SUSP
3.0000 mg | Freq: Once | INTRAMUSCULAR | Status: DC
Start: 2023-03-03 — End: 2023-04-21

## 2023-03-03 MED ORDER — BETAMETHASONE SOD PHOS & ACET 6 (3-3) MG/ML IJ SUSP
3.0000 mg | Freq: Once | INTRAMUSCULAR | Status: AC
  Administered 2023-03-03: 3 mg via INTRA_ARTICULAR

## 2023-03-03 NOTE — Progress Notes (Signed)
Chief Complaint  Patient presents with   Foot Pain    Patient came in today for heel and ankle pain, started a day ago,rate of pain 7 out of 10,     Subjective:  89 y.o. male presenting today for evaluation of right ankle and heel pain.  Onset about 1 day ago.  Patient noticed some mild discomfort yesterday but when he woke up this morning he had pain and tenderness and he states that he almost had to use his walker to ambulate.  He has had ankle pain in the right ankle previously and cortisone injections have helped significantly.  Presenting for further treatment evaluation   Past Medical History:  Diagnosis Date   Anemia, unspecified    Arthritis    "right knee" (03/28/2013   CAD in native artery    Cardiomyopathy (HCC)    EF 40%:  ECHO 11/25/2015 EF 55%-60%   Carpal tunnel syndrome, bilateral    Chronic diastolic (congestive) heart failure (HCC)    Chronic kidney disease (CKD), stage III (moderate) (HCC)    Epistaxis    Essential hypertension, benign    takes Metoprolol daily   H/O hiatal hernia    History of kidney stones    Hyperlipidemia    takes Lipitor daily   Mitral regurgitation    OSA on CPAP    wears CPAP 02/03/16   Pacemaker    MDT   PAF (paroxysmal atrial fibrillation) (HCC)    Paroxysmal ventricular tachycardia (HCC)    PONV (postoperative nausea and vomiting)    PVC's (premature ventricular contractions)    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 Excell Seltzer & Laneta Simmers.   Severe aortic stenosis    Vitamin D deficiency     Past Surgical History:  Procedure Laterality Date   CARDIAC CATHETERIZATION  03/21/2013   CARDIOVERSION N/A 10/09/2018   Procedure: CARDIOVERSION;  Surgeon: Wendall Stade, MD;  Location: [facility] ENDOSCOPY;  Service: Cardiovascular;  Laterality: N/A;   CARPAL TUNNEL RELEASE Right 02/05/2016   Procedure: RIGHT LIMITED OPEN CARPAL TUNNEL RELEASE;   Surgeon: Dominica Severin, MD;  Location: [facility] OR;  Service: Orthopedics;  Laterality: Right;   CARPAL TUNNEL RELEASE Left 04/21/2016   Procedure: CARPAL TUNNEL RELEASE;  Surgeon: Dominica Severin, MD;  Location: [facility] OR;  Service: Orthopedics;  Laterality: Left;   CATARACT EXTRACTION W/ INTRAOCULAR LENS  IMPLANT, BILATERAL Bilateral 2000's   COLONOSCOPY     CORONARY ANGIOPLASTY WITH STENT PLACEMENT  03/28/2013   "3 stents" (03/28/2013)   HIP ARTHROPLASTY Right 04/30/2019   Procedure: ARTHROPLASTY BIPOLAR HIP (HEMIARTHROPLASTY);  Surgeon: Durene Romans, MD;  Location: [facility] ORS;  Service: Orthopedics;  Laterality: Right;   INSERT / REPLACE / REMOVE PACEMAKER     INTRAOPERATIVE TRANSTHORACIC ECHOCARDIOGRAM N/A 04/12/2022   Procedure: INTRAOPERATIVE TRANSTHORACIC ECHOCARDIOGRAM;  Surgeon: Tonny Bollman, MD;  [facility] OR;  Service: Open Heart Surgery;  Laterality: N/A;   KNEE CARTILAGE SURGERY Left 1972   "cut it open" (03/28/2013)   KNEE CARTILAGE SURGERY Right ~ 1977   "cut it open" (03/28/2013)   PACEMAKER INSERTION  10/05/10   MDT Adapta L implanted by Dr [name] for mobitz II second degree AV block   PERCUTANEOUS CORONARY ROTOBLATOR INTERVENTION (PCI-R) N/A 03/28/2013   Procedure: PERCUTANEOUS CORONARY ROTOBLATOR INTERVENTION (PCI-R);  Surgeon: Corky Crafts, MD;  Location: [facility] CATH LAB;  Service: Cardiovascular;  Laterality: N/A;  RIGHT/LEFT HEART CATH AND CORONARY ANGIOGRAPHY N/A 01/27/2022   Procedure: RIGHT/LEFT HEART CATH AND CORONARY ANGIOGRAPHY;  Surgeon: Corky Crafts, MD;  Location[facility] INVASIVE CV LAB;  Service: Cardiovascular;  Laterality: N/A;   SEPTOPLASTY N/A 02/05/2016   Procedure: SEPTOPLASTY WITH INTRANASAL SKIN GRAFT;  Surgeon: Drema Halon, MD;  Location: [facility] OR;  Service: ENT;  Laterality: N/A;   SHOULDER HEMI-ARTHROPLASTY Right 1987   "got hit by sled & dragged down sheet > 200 feet; tore shoulder up" (03/28/2013)   SHOULDER SURGERY Right 1970's   "pulled muscle  apart; sewed it back together" (03/28/2013   TOTAL KNEE ARTHROPLASTY Right 2007   TOTAL KNEE ARTHROPLASTY  11/26/2012   Procedure: TOTAL KNEE ARTHROPLASTY;  Surgeon: Loanne Drilling, MD;  Location: [facility] ORS;  Service: Orthopedics;  Laterality: Left;   TRANSCATHETER AORTIC VALVE REPLACEMENT, TRANSFEMORAL N/A 04/12/2022   Procedure: Transcatheter Aortic Valve Replacement, Transfemoral;  Surgeon: Tonny Bollman, MD;  Location[facility] OR;  Service: Open Heart Surgery;  Laterality: N/A;   TRANSURETHRAL RESECTION OF PROSTATE N/A 12/27/2017   Procedure: TRANSURETHRAL RESECTION OF THE PROSTATE (TURP);  Surgeon: Crista Elliot, MD;  Location[facility];  Service: Urology;  Laterality: N/A;    Allergies  Allergen Reactions   Penicillins Hives    Has tolerated Keflex      Has patient had a PCN reaction causing immediate rash, facial/tongue/throat swelling, SOB or lightheadedness with hypotension: YES Has patient had a PCN reaction causing severe rash involving mucus membranes or skin necrosis: No Has patient had a PCN reaction that required hospitalization No Has patient had a PCN reaction occurring within the last 10 years: No If all of the above answers are "NO", then may proceed with Cephalosporin use.    Hydromorphone Hcl Nausea And Vomiting    Objective / Physical Exam:  General:  The patient is alert and oriented x3 in no acute distress. Dermatology:  Skin is warm, dry and supple bilateral lower extremities. Negative for open lesions or macerations. Vascular:  Moderate edema noted bilateral lower extremities with varicosities.  Skin is warm to touch.  Capillary refill WNL. Neurological:  Epicritic and protective threshold grossly intact bilaterally.  Musculoskeletal Exam:  Pain on palpation noted specifically to the lateral aspect of the right ankle as well as the lateral aspect of the right heel.  Range of motion WNL.  Radiographic Exam RT ankle 03/03/2023:  Diffuse  degenerative changes noted consistent with DJD and arthritis which is expected given the patient's age.  No acute fracture identified.  Impression: Negative  Assessment: 1.  Capsulitis lateral aspect of the right ankle  -Patient evaluated.  X-rays reviewed negative for fracture -Injection of 0.5 cc Celestone Soluspan injected in the lateral aspect of the right ankle and lateral aspect of the right heel.  Patient states that previous injections have helped significantly alleviate his pain for a long period of time -Continue wearing good supportive shoes and sneakers.  Advised against going barefoot -Clinic as needed  Felecia Shelling, DPM [facility]  Dr. Felecia Shelling, DPM    [address] [place], [place] [address]                Office [phone]  Fax [phone])  [phone]  

## 2023-03-09 NOTE — Telephone Encounter (Signed)
I spoke with patient.  He was seen at [facility] on 4/25 for dizziness.  Patient reports dizziness has now improved.  No complaints at this time.  He is aware of appointment for echo and office visit on 5/31

## 2023-03-14 ENCOUNTER — Other Ambulatory Visit: Payer: Self-pay | Admitting: Podiatry

## 2023-03-14 DIAGNOSIS — M19071 Primary osteoarthritis, right ankle and foot: Secondary | ICD-10-CM

## 2023-03-14 DIAGNOSIS — M79671 Pain in right foot: Secondary | ICD-10-CM

## 2023-03-19 ENCOUNTER — Other Ambulatory Visit: Payer: Self-pay | Admitting: Internal Medicine

## 2023-03-19 ENCOUNTER — Other Ambulatory Visit: Payer: Self-pay | Admitting: Interventional Cardiology

## 2023-03-21 NOTE — Progress Notes (Signed)
Remote pacemaker transmission.   

## 2023-03-21 NOTE — Addendum Note (Signed)
Addended by: Elease Etienne A on: 03/21/2023 09:29 PM   Modules accepted: Level of Service

## 2023-03-30 NOTE — Progress Notes (Unsigned)
[facility]ENTER   MULTIDISCIPLINARY HEART VALVE CLINIC                                     Cardiology Office Note:    Date:  04/03/2023   ID:  Gregory Mccormick, DOB [birth year], MRN [id number]  PCP:  Gregory Joe, MD  [facility] Cardiologist:  Gregory Muss, MD  / Dr. Excell Mccormick & Dr. Laneta Mccormick (TAVR) [facility] Electrophysiologist:  Gregory Prude, MD   Referring MD: Gregory Joe, MD   1 year s/p TAVR  History of Present Illness:    Gregory Mccormick is a 89 y.o. male with a hx of severe MR with prolapse/flail leaflet, CAD s/p LAD and LCX stenting in 2014, paroxysmal atrial fibrillation on Eliquis, CHB s/p Medtronic PPM, aortic root dilation, ischemic CM with recovered EF, HTN, HLD, CKD stage IIIb, mitral regurgitation and severe aortic stenosis s/p TAVR (04/12/22) who presents to clinic for follow up.    He was hospitalized in 08/2018 with atypical chest pain. Troponins were flat and CTA was negative for PE. However, his EF was noted to be down - EF 30-35%, diffuse HK, moderate AS, mild MR, mod LAE, severely dilated RA, mildly dilated RV, mild TR, PASP 46, elevated CVP. EF recovered to normal by echo in 01/2021 and aortic stenosis continued to progress.    [name] has remained remarkably active and functional. He continues to mow his yard and rides on a tractor and does a lot of work around his property. Last year, he developed increasing shortness of breath and fatigue. TTE 01/24/22 showed EF 55%, severe AS with a mean grad , peak grad 58.13mmHg, AVA 1.11cm2, DVI 0.18, SVI 45 as well as severe MR with prolapse and possible flail segment of the posterior leaflet with anteriorly directed jet. [facility] 01/27/22 showed CAD with ostial and proximal LCX stenosis at 60%, 2nd marginal lesion at 80%, pRCA and mRCA at 25%, with hemodynamic consistent with pulmonary HTN. Given the lack of angina, intervention was deferred.   He was evaluated by the multidisciplinary valve team and  underwent successful TAVR with a 26 mm Edwards Sapien 3 Ultra Resilia THV via the TF approach on 04/12/22. An extra CC was added during deployment. Post operative echo showed EF 55-60%, normally functioning TAVR with a mean gradient of 11 mmHg and no PVL. He was discharged on home Eliquis. 1 month echo showed EF 55%, normally functioning TAVR with a mean gradient of 8 mm hg and no PVL as well as moderate-severe MR and moderate TR.  Had some chest pain in ED 07/01/22 troponin negative. Imdur added and coreg increased. Creat up 2.01 from 1.49 on higher dose lasix 40 mg daily. Seen in ER on 01/31/23 for cough and SOB and treated for bronchitis with improvement.  Today the patient presents to clinic for follow up. Here alone. He remains quite active around his house and yard but noticed being more fatigued and winded since last August. LE swelling a little worse. Has a place of weeping right leg. Takes 40mg  of lasix daily with an extra 20 mg as needed. No chest pain. No dizziness or syncope. No blood in stool or urine. Plans to go to the beach at the end of August.   Past Medical History:  Diagnosis Date   Anemia, unspecified    Arthritis    "right knee" (03/28/2013   CAD  in native artery    Cardiomyopathy (HCC)    EF 40%:  ECHO 11/25/2015 EF 55%-60%   Carpal tunnel syndrome, bilateral    Chronic diastolic (congestive) heart failure (HCC)    Chronic kidney disease (CKD), stage III (moderate) (HCC)    Epistaxis    Essential hypertension, benign    takes Metoprolol daily   H/O hiatal hernia    History of kidney stones    Hyperlipidemia    takes Lipitor daily   Mitral regurgitation    OSA on CPAP    wears CPAP 02/03/16   Pacemaker    MDT   PAF (paroxysmal atrial fibrillation) (HCC)    Paroxysmal ventricular tachycardia (HCC)    PONV (postoperative nausea and vomiting)    PVC's (premature ventricular contractions)    S/P TAVR (transcatheter aortic valve replacement) 04/12/2022   s/p TAVR with a 26mm  Melodye Ped (with an extra CC) via the TF approach by Dr. Excell Mccormick & Gregory Mccormick.   Severe aortic stenosis    Vitamin D deficiency     Past Surgical History:  Procedure Laterality Date   CARDIAC CATHETERIZATION  03/21/2013   CARDIOVERSION N/A 10/09/2018   Procedure: CARDIOVERSION;  Surgeon: Gregory Stade, MD;  Location: [facility] ENDOSCOPY;  Service: Cardiovascular;  Laterality: N/A;   CARPAL TUNNEL RELEASE Right 02/05/2016   Procedure: RIGHT LIMITED OPEN CARPAL TUNNEL RELEASE;  Surgeon: Gregory Severin, MD;  Location: [facility] OR;  Service: Orthopedics;  Laterality: Right;   CARPAL TUNNEL RELEASE Left 04/21/2016   Procedure: CARPAL TUNNEL RELEASE;  Surgeon: Gregory Severin, MD;  Location: [facility] OR;  Service: Orthopedics;  Laterality: Left;   CATARACT EXTRACTION W/ INTRAOCULAR LENS  IMPLANT, BILATERAL Bilateral 2000's   COLONOSCOPY     CORONARY ANGIOPLASTY WITH STENT PLACEMENT  03/28/2013   "3 stents" (03/28/2013)   HIP ARTHROPLASTY Right 04/30/2019   Procedure: ARTHROPLASTY BIPOLAR HIP (HEMIARTHROPLASTY);  Surgeon: Gregory Romans, MD;  Location: [facility] ORS;  Service: Orthopedics;  Laterality: Right;   INSERT / REPLACE / REMOVE PACEMAKER     INTRAOPERATIVE TRANSTHORACIC ECHOCARDIOGRAM N/A 04/12/2022   Procedure: INTRAOPERATIVE TRANSTHORACIC ECHOCARDIOGRAM;  Surgeon: Gregory Bollman, MD;  Location: [facility] OR;  Service: Open Heart Surgery;  Laterality: N/A;   KNEE CARTILAGE SURGERY Left 1972   "cut it open" (03/28/2013)   KNEE CARTILAGE SURGERY Right ~ 1977   "cut it open" (03/28/2013)   PACEMAKER INSERTION  10/05/10   MDT Adapta L implanted by Dr Gregory Mccormick for mobitz II second degree AV block   PERCUTANEOUS CORONARY ROTOBLATOR INTERVENTION (PCI-R) N/A 03/28/2013   Procedure: PERCUTANEOUS CORONARY ROTOBLATOR INTERVENTION (PCI-R);  Surgeon: Gregory Crafts, MD;  Location: [facility] CATH LAB;  Service: Cardiovascular;  Laterality: N/A;   RIGHT/LEFT HEART CATH AND CORONARY ANGIOGRAPHY N/A 01/27/2022   Procedure: RIGHT/LEFT HEART CATH AND  CORONARY ANGIOGRAPHY;  Surgeon: Gregory Crafts, MD;  Location: [facility] INVASIVE CV LAB;  Service: Cardiovascular;  Laterality: N/A;   SEPTOPLASTY N/A 02/05/2016   Procedure: SEPTOPLASTY WITH INTRANASAL SKIN GRAFT;  Surgeon: Drema Halon, MD;  Location: [facility] OR;  Service: ENT;  Laterality: N/A;   SHOULDER HEMI-ARTHROPLASTY Right 1987   "got hit by sled & dragged down sheet > 200 feet; tore shoulder up" (03/28/2013)   SHOULDER SURGERY Right 1970's   "pulled muscle apart; sewed it back together" (03/28/2013   TOTAL KNEE ARTHROPLASTY Right 2007   TOTAL KNEE ARTHROPLASTY  11/26/2012   Procedure: TOTAL KNEE ARTHROPLASTY;  Surgeon: Loanne Drilling, MD;  Location: [facility] ORS;  Service: Orthopedics;  Laterality: Left;   TRANSCATHETER AORTIC VALVE REPLACEMENT, TRANSFEMORAL N/A 04/12/2022   Procedure: Transcatheter Aortic Valve Replacement, Transfemoral;  Surgeon: Gregory Bollman, MD;  Location: [facility] OR;  Service: Open Heart Surgery;  Laterality: N/A;   TRANSURETHRAL RESECTION OF PROSTATE N/A 12/27/2017   Procedure: TRANSURETHRAL RESECTION OF THE PROSTATE (TURP);  Surgeon: Crista Elliot, MD;  Location: [facility];  Service: Urology;  Laterality: N/A;    Current Medications: Current Meds  Medication Sig   albuterol (VENTOLIN HFA) 108 (90 Base) MCG/ACT inhaler Inhale 2 puffs into the lungs every 6 (six) hours as needed for wheezing or shortness of breath.   amiodarone (PACERONE) 200 MG tablet TAKE 1 TABLET BY MOUTH DAILY   atorvastatin (LIPITOR) 10 MG tablet TAKE 1 TABLET BY MOUTH DAILY AT  6 PM.   carvedilol (COREG) 12.5 MG tablet Take 1 tablet (12.5 mg total) by mouth 2 (two) times daily with a meal.   ELIQUIS 2.5 MG TABS tablet TAKE 1 TABLET BY MOUTH TWICE  DAILY   furosemide (LASIX) 40 MG tablet TAKE 1 TABLET BY MOUTH DAILY   isosorbide mononitrate (IMDUR) 30 MG 24 hr tablet Take 1 tablet (30 mg total) by mouth at bedtime.   latanoprost (XALATAN) 0.005 % ophthalmic solution Place 1  drop into the left eye at bedtime.   meclizine (ANTIVERT) 25 MG tablet as needed for dizziness.   Multiple Vitamins-Minerals (PRESERVISION AREDS 2+MULTI VIT PO) Take 1 capsule by mouth in the morning and at bedtime.   nitroGLYCERIN (NITROSTAT) 0.4 MG SL tablet Place 0.4 mg under the tongue every 5 (five) minutes as needed for chest pain.   Omega-3 Fatty Acids (FISH OIL) 1000 MG CAPS Take 1,000 mg by mouth daily.   sacubitril-valsartan (ENTRESTO) 24-26 MG TAKE 1 TABLET BY MOUTH TWICE  DAILY   timolol (TIMOPTIC) 0.5 % ophthalmic solution Place 1 drop into the right eye 2 (two) times daily.   Current Facility-Administered Medications for the 03/31/23 encounter (Office Visit) with [facility]  Medication   betamethasone acetate-betamethasone sodium phosphate (CELESTONE) injection 3 mg   betamethasone acetate-betamethasone sodium phosphate (CELESTONE) injection 3 mg   dexamethasone (DECADRON) injection 4 mg     Allergies:   Penicillins and Hydromorphone hcl   Social History   Socioeconomic History   Marital status: Widowed    Spouse name: Not on file   Number of children: 3   Years of education: Not on file   Highest education level: Not on file  Occupational History   Occupation: retired    Associate Professor: [employer]  Tobacco Use   Smoking status: Former    Packs/day: 1.00    Years: 26.00    Additional pack years: 0.00    Total pack years: 26.00    Types: Cigarettes    Quit date: 10/31/1973    Years since quitting: 49.4   Smokeless tobacco: Never  Vaping Use   Vaping Use: Never used  Substance and Sexual Activity   Alcohol use: No   Drug use: No   Sexual activity: Not Currently  Other Topics Concern   Not on file  Social History Narrative   Not on file   Social Determinants of Health   Financial Resource Strain: Not on file  Food Insecurity: Not on file  Transportation Needs: Not on file  Physical Activity: Not on file  Stress: Not on file  Social  Connections: Not on file     Family History: The patient's family history  includes CAD (age of onset: 5) in his brother; Cancer in an other family member; Heart disease in an other family member; Stroke in his brother. There is no history of Heart attack or Hypertension.  ROS:   Please see the history of present illness.    All other systems reviewed and are negative.  EKGs/Labs/Other Studies Reviewed:    The following studies were reviewed today:   TAVR OPERATIVE NOTE     Date of Procedure:                04/12/2022   Preoperative Diagnosis:      Severe Aortic Stenosis    Postoperative Diagnosis:    Same    Procedure:        Transcatheter Aortic Valve Replacement - Percutaneous Right Transfemoral Approach             Edwards Sapien 3 Ultra Resilia THV (size 26 mm, model # 9755RSL, serial # [id number])              Co-Surgeons:                        [name], MD and Gregory Bollman, MD   Anesthesiologist:                  Adonis Huguenin, MD   Echocardiographer:              Riley Lam, MD   Pre-operative Echo Findings: Severe aortic stenosis Normal left ventricular systolic function   Post-operative Echo Findings: No paravalvular leak Unchanged left ventricular systolic function _____________     Echo 04/13/22:  IMPRESSIONS   1. The aortic valve has been replaced by a 26 mm Edwards Sapien Valve.  Aortic valve regurgitation is not visualized (neither central no PVL).  Effective orifice area, by VTI measures 2.02 cm. Aortic valve mean  gradient measures 11.0 mmHg. Peak gradient  20 mm Hg. DVI 0.4.   2. Left ventricular ejection fraction, by estimation, is 55 to 60%. The  left ventricle has normal function. The left ventricle has no regional  wall motion abnormalities. There is mild concentric left ventricular  hypertrophy. Left ventricular diastolic  parameters are indeterminate.   3. The mitral valve is degenerative. Moderate to severe mitral valve   regurgitation, without full assessment. There is splay artifact. Prior  studies have shown prolapse; this was not well demonstrated in this study.   4. Right ventricular systolic function is normal. The right ventricular  size is normal. There is severely elevated pulmonary artery systolic  pressure. The estimated right ventricular systolic pressure is 60.7 mmHg.   5. Tricuspid valve regurgitation is mild to moderate.   6. The inferior vena cava is dilated in size with <50% respiratory  variability, suggesting right atrial pressure of 15 mmHg.   Comparison(s): Echo findings are consistent with normal structure and  function of the aortic valve prosthesis. Mitral regurgitation is at least  moderate to severe.   ____________________  Echo 05/20/22 IMPRESSIONS  1. Left ventricular ejection fraction, by estimation, is 55%. The left  ventricle has normal function. The left ventricle has no regional wall  motion abnormalities. There is mild concentric left ventricular  hypertrophy. Left ventricular diastolic  parameters are indeterminate.   2. Right ventricular systolic function is normal. The right ventricular  size is normal. There is normal pulmonary artery systolic pressure. The  estimated right ventricular systolic pressure is 34.8 mmHg.   3.  Left atrial size was moderately dilated.   4. Right atrial size was mildly dilated.   5. The mitral valve is degenerative. Suspect moderate mitral valve  regurgitation, eccentric with horizontal color splay. No evidence of  mitral stenosis.   6. Tricuspid valve regurgitation is mild to moderate.   7. The aortic valve has been replaced by a 26 mm Edwards Sapien Valve.  Aortic valve regurgitation is not visualized. Effective orifice area, by  VTI measures 2.28 cm. Aortic valve mean gradient measures 8.0 mmHg. DVI  0.63. Peak gradient 15 mm Hg.   8. Echo findings are consistent with normal structure and function of the  aortic valve prosthesis.    Comparison(s): Stable prosthetic aortic findings. Largely similar mitral regurgitation findings.    _______________________  Echo 03/31/23 IMPRESSIONS   1. Left ventricular ejection fraction, by estimation, is 55 to 60%. The  left ventricle has normal function. The left ventricle has no regional  wall motion abnormalities. Left ventricular diastolic parameters were  normal.   2. Right ventricular systolic function is normal. The right ventricular  size is normal.   3. There is likely severe very eccentric mitral regurgitation with Coanda  effect.   4. Tricuspid valve regurgitation is mild to moderate.   5. The aortic valve has been repaired/replaced. Aortic valve  regurgitation is not visualized. No aortic stenosis is present. Echo  findings are consistent with a small perivalvular leak of the aortic  prosthesis.   6. The inferior vena cava is normal in size with greater than 50%  respiratory variability, suggesting right atrial pressure of 3 mmHg.   EKG:  EKG is NOT ordered today.    Recent Labs: 04/13/2022: Magnesium 2.2 10/06/2022: TSH 4.110 01/31/2023: ALT 11; BUN 14; Creatinine, Ser 1.38; Hemoglobin 13.0; Platelets 121; Potassium 3.6; Sodium 136  Recent Lipid Panel    Component Value Date/Time   CHOL 108 04/30/2019 0518   CHOL 108 11/17/2017 0848   TRIG 29 04/30/2019 0518   HDL 52 04/30/2019 0518   HDL 56 11/17/2017 0848   CHOLHDL 2.1 04/30/2019 0518   VLDL 6 04/30/2019 0518   LDLCALC 50 04/30/2019 0518   LDLCALC 39 11/17/2017 0848     Risk Assessment/Calculations:    CHA2DS2-VASc Score = 5   This indicates a 7.2% annual risk of stroke. The patient's score is based upon: CHF History: 1 HTN History: 1 Diabetes History: 0 Stroke History: 0 Vascular Disease History: 1 Age Score: 2 Gender Score: 0      Physical Exam:    VS:  BP 130/78   Pulse 91   Ht 5\' 9"  (1.753 m)   Wt 189 lb (85.7 kg)   SpO2 99%   BMI 27.91 kg/m     Wt Readings from Last 3  Encounters:  03/31/23 189 lb (85.7 kg)  01/31/23 186 lb (84.4 kg)  10/06/22 191 lb (86.6 kg)     GEN:  Well nourished, well developed in no acute distress HEENT: Normal NECK: No JVD LYMPHATICS: No lymphadenopathy CARDIAC: RRR, 3/6 holosystolic murmur at apex. No rubs or gallops RESPIRATORY:  Clear to auscultation without rales, wheezing or rhonchi  ABDOMEN: Soft, non-tender, non-distended MUSCULOSKELETAL:  No edema; No deformity  SKIN: Warm and dry.   NEUROLOGIC:  Alert and oriented x 3 PSYCHIATRIC:  Normal affect   ASSESSMENT:    1. S/P TAVR (transcatheter aortic valve replacement)   2. Nonrheumatic mitral valve regurgitation   3. Coronary artery disease involving native coronary artery of native  heart without angina pectoris   4. Paroxysmal atrial fibrillation (HCC)   5. Essential hypertension   6. Chronic diastolic (congestive) heart failure (HCC)   7. Renal lesion     PLAN:    In order of problems listed above:  Severe AS s/p TAVR: echo today shows EF 55%, normally functioning TAVR with a mean gradient of 6.5 mm hg and mild PVL. He has NYHA class II symptoms. He has azithromycin for SBE prophylaxis due to a PCN allergy. Continue on home Eliquis 5mg  BID. Continue regular follow up with Dr. Eldridge Dace.   Severe mitral regurgitation: echo today shows likely severe very eccentric MR with Coanda effect. He has had worsening symptoms of SOB and fatigue. Will review with team to see if he might be a mTEER candidate.    CAD: [facility] 01/27/22 showed CAD with ostial and proximal LCX stenosis at 60%, 2nd marginal lesion at 80%, pRCA and mRCA at 25%. Given the lack of angina, intervention was deferred. Continue medical therapy.    PAF: continue amiodarone and Eliquis 2.5mg  BID (appropriate dosing based on age and renal function).    HTN: BP well controlled today. No changes made   CHB s/p Medtronic PPM: followed by Dr. Johney Mccormick    Hx of ischemic CM: EF now normalized. Continue Entresto  and Coreg 3.125mg  BID. He appears euvolemic.    CKD stage IIIb: last creat improved at 1.38 on 01/31/23   Renal lesions: pre TAVR CTs showed new bilateral renal lesions. Follow up renal US confirmed cystic lesions. No further work up reccommended.    Medication Adjustments/Labs and Tests Ordered: Current medicines are reviewed at length with the patient today.  Concerns regarding medicines are outlined above.  No orders of the defined types were placed in this encounter.  No orders of the defined types were placed in this encounter.   Patient Instructions  Medication Instructions:  Your physician recommends that you continue on your current medications as directed. Please refer to the Current Medication list given to you today.  *If you need a refill on your cardiac medications before your next appointment, please call your pharmacy*   Lab Work: NONE If you have labs (blood work) drawn today and your tests are completely normal, you will receive your results only by: MyChart Message (if you have MyChart) OR A paper copy in the mail If you have any lab test that is abnormal or we need to change your treatment, we will call you to review the results.   Testing/Procedures: NONE   Follow-Up: At [facility], you and your health needs are our priority.  As part of our continuing mission to provide you with exceptional heart care, we have created designated Provider Care Teams.  These Care Teams include your primary Cardiologist (physician) and Advanced Practice Providers (APPs -  Physician Assistants and Nurse Practitioners) who all work together to provide you with the care you need, when you need it.  We recommend signing up for the patient portal called "MyChart".  Sign up information is provided on this After Visit Summary.  MyChart is used to connect with patients for Virtual Visits (Telemedicine).  Patients are able to view lab/test results, encounter notes, upcoming  appointments, etc.  Non-urgent messages can be sent to your provider as well.   To learn more about what you can do with MyChart, go to [email or web address].    Your next appointment:   KEEP SCHEDULED FOLLOW-UP   Signed, Cline Crock, PA-C  04/03/2023  10:14 AM    [facility]

## 2023-03-31 ENCOUNTER — Other Ambulatory Visit (HOSPITAL_COMMUNITY): Payer: Medicare Other

## 2023-03-31 ENCOUNTER — Ambulatory Visit: Payer: Medicare Other

## 2023-03-31 ENCOUNTER — Ambulatory Visit (HOSPITAL_COMMUNITY): Payer: Medicare Other | Attending: Cardiology

## 2023-03-31 ENCOUNTER — Ambulatory Visit: Payer: Medicare Other | Attending: Physician Assistant | Admitting: Physician Assistant

## 2023-03-31 VITALS — BP 130/78 | HR 91 | Ht 69.0 in | Wt 189.0 lb

## 2023-03-31 DIAGNOSIS — I480 Paroxysmal atrial fibrillation: Secondary | ICD-10-CM | POA: Diagnosis not present

## 2023-03-31 DIAGNOSIS — I340 Nonrheumatic mitral (valve) insufficiency: Secondary | ICD-10-CM

## 2023-03-31 DIAGNOSIS — I5032 Chronic diastolic (congestive) heart failure: Secondary | ICD-10-CM

## 2023-03-31 DIAGNOSIS — I2510 Atherosclerotic heart disease of native coronary artery without angina pectoris: Secondary | ICD-10-CM

## 2023-03-31 DIAGNOSIS — Z952 Presence of prosthetic heart valve: Secondary | ICD-10-CM

## 2023-03-31 DIAGNOSIS — I10 Essential (primary) hypertension: Secondary | ICD-10-CM

## 2023-03-31 DIAGNOSIS — N289 Disorder of kidney and ureter, unspecified: Secondary | ICD-10-CM

## 2023-03-31 LAB — ECHOCARDIOGRAM COMPLETE
AR max vel: 1.59 cm2
AV Area VTI: 1.77 cm2
AV Area mean vel: 1.62 cm2
AV Mean grad: 6.5 mmHg
AV Peak grad: 11.9 mmHg
Ao pk vel: 1.73 m/s
Area-P 1/2: 2.93 cm2
MV M vel: 5.4 m/s
MV Peak grad: 116.6 mmHg
P 1/2 time: 686 msec
S' Lateral: 2.85 cm

## 2023-03-31 NOTE — Patient Instructions (Signed)
Medication Instructions:  Your physician recommends that you continue on your current medications as directed. Please refer to the Current Medication list given to you today.  *If you need a refill on your cardiac medications before your next appointment, please call your pharmacy*   Lab Work: NONE If you have labs (blood work) drawn today and your tests are completely normal, you will receive your results only by: MyChart Message (if you have MyChart) OR A paper copy in the mail If you have any lab test that is abnormal or we need to change your treatment, we will call you to review the results.   Testing/Procedures: NONE   Follow-Up: At [facility], you and your health needs are our priority.  As part of our continuing mission to provide you with exceptional heart care, we have created designated Provider Care Teams.  These Care Teams include your primary Cardiologist (physician) and Advanced Practice Providers (APPs -  Physician Assistants and Nurse Practitioners) who all work together to provide you with the care you need, when you need it.  We recommend signing up for the patient portal called "MyChart".  Sign up information is provided on this After Visit Summary.  MyChart is used to connect with patients for Virtual Visits (Telemedicine).  Patients are able to view lab/test results, encounter notes, upcoming appointments, etc.  Non-urgent messages can be sent to your provider as well.   To learn more about what you can do with MyChart, go to [email or web address].    Your next appointment:   KEEP SCHEDULED FOLLOW-UP 

## 2023-04-06 NOTE — Progress Notes (Signed)
  Electrophysiology Office Follow up Visit Note:    Date:  04/07/2023   ID:  Gregory Mccormick, DOB [birth year], MRN [id number]  PCP:  Tally Joe, MD  [facility] Cardiologist:  Lance Muss, MD  [facility] Electrophysiologist:  Lanier Prude, MD    Interval History:    Gregory Mccormick is a 89 y.o. male who presents for a follow up visit.   He was previously followed by Dr. [name].  He last saw [name] in clinic October 06, 2022.  He has a history of severe aortic stenosis post TAVR in 2023.  He also has a history of obstructive sleep apnea, hypertension, CKD 3, chronic systolic heart failure and symptomatic second-degree heart block post permanent pacemaker in December 2011.  He has been doing well.  No problems with his pacemaker.      Past medical, surgical, social and family history were reviewed.  ROS:   Please see the history of present illness.    All other systems reviewed and are negative.  EKGs/Labs/Other Studies Reviewed:    The following studies were reviewed today:  April 07, 2023 in clinic device interrogation personally reviewed Battery longevity 23 months Lead parameter stable Underlying rhythm sinus bradycardia at 58 No programming changes made today    EKG from January 31, 2023 shows a paced, V sensed rhythm with prolonged AV conduction.  QRS is narrow.   Physical Exam:    VS:  BP 120/68   Pulse 87   Ht 5\' 9"  (1.753 m)   Wt 179 lb 12.8 oz (81.6 kg)   SpO2 95%   BMI 26.55 kg/m     Wt Readings from Last 3 Encounters:  04/07/23 179 lb 12.8 oz (81.6 kg)  03/31/23 189 lb (85.7 kg)  01/31/23 186 lb (84.4 kg)     GEN:  Well nourished, well developed in no acute distress CARDIAC: RRR, no murmurs, rubs, gallops.  Pacemaker pocket well-healed RESPIRATORY:  Clear to auscultation without rales, wheezing or rhonchi       ASSESSMENT:    1. Pacemaker   2. PAF (paroxysmal atrial fibrillation) (HCC)    PLAN:    In order of problems listed  above:  #Symptomatic bradycardia #Permanent pacemaker in situ Device functioning appropriately.  Continue remote monitoring.  #Paroxysmal atrial fibrillation Asymptomatic, low burden. On amiodarone for rhythm control Will need repeat CMP, TSH and free T4 in 6 months.  #Chronic diastolic heart failure NYHA class II.  Warm and dry on exam.    Follow-up in 6 months with APP.       Signed, Steffanie Dunn, MD, [facility], [facility] 04/07/2023 8:37 AM    Electrophysiology [facility]

## 2023-04-07 ENCOUNTER — Ambulatory Visit: Payer: Medicare Other | Attending: Cardiology | Admitting: Cardiology

## 2023-04-07 ENCOUNTER — Encounter: Payer: Self-pay | Admitting: Cardiology

## 2023-04-07 VITALS — BP 120/68 | HR 87 | Ht 69.0 in | Wt 179.8 lb

## 2023-04-07 DIAGNOSIS — I480 Paroxysmal atrial fibrillation: Secondary | ICD-10-CM | POA: Diagnosis not present

## 2023-04-07 DIAGNOSIS — Z950 Presence of cardiac pacemaker: Secondary | ICD-10-CM

## 2023-04-07 LAB — CUP PACEART INCLINIC DEVICE CHECK
Battery Impedance: 2922 Ohm
Battery Remaining Longevity: 23 mo
Battery Voltage: 2.73 V
Brady Statistic AP VP Percent: 0 %
Brady Statistic AP VS Percent: 78 %
Brady Statistic AS VP Percent: 0 %
Brady Statistic AS VS Percent: 22 %
Date Time Interrogation Session: 20240607084258
Implantable Lead Connection Status: 753985
Implantable Lead Connection Status: 753985
Implantable Lead Implant Date: 20111206
Implantable Lead Implant Date: 20111206
Implantable Lead Location: 753859
Implantable Lead Location: 753860
Implantable Lead Model: 5076
Implantable Lead Model: 5092
Implantable Pulse Generator Implant Date: 20111206
Lead Channel Impedance Value: 497 Ohm
Lead Channel Impedance Value: 768 Ohm
Lead Channel Pacing Threshold Amplitude: 0.875 V
Lead Channel Pacing Threshold Amplitude: 1 V
Lead Channel Pacing Threshold Amplitude: 1 V
Lead Channel Pacing Threshold Amplitude: 1.125 V
Lead Channel Pacing Threshold Pulse Width: 0.4 ms
Lead Channel Pacing Threshold Pulse Width: 0.4 ms
Lead Channel Pacing Threshold Pulse Width: 0.4 ms
Lead Channel Pacing Threshold Pulse Width: 0.4 ms
Lead Channel Sensing Intrinsic Amplitude: 15.67 mV
Lead Channel Sensing Intrinsic Amplitude: 2 mV
Lead Channel Setting Pacing Amplitude: 2.25 V
Lead Channel Setting Pacing Amplitude: 2.5 V
Lead Channel Setting Pacing Pulse Width: 0.4 ms
Lead Channel Setting Sensing Sensitivity: 5.6 mV
Zone Setting Status: 755011
Zone Setting Status: 755011

## 2023-04-07 NOTE — Patient Instructions (Signed)
Medication Instructions:  Your physician recommends that you continue on your current medications as directed. Please refer to the Current Medication list given to you today.  *If you need a refill on your cardiac medications before your next appointment, please call your pharmacy*   Lab Work: CMET, TSH, and T4 prior to your next office visit  If you have labs (blood work) drawn today and your tests are completely normal, you will receive your results only by: MyChart Message (if you have MyChart) OR A paper copy in the mail If you have any lab test that is abnormal or we need to change your treatment, we will call you to review the results.   Follow-Up: At [facility], you and your health needs are our priority.  As part of our continuing mission to provide you with exceptional heart care, we have created designated Provider Care Teams.  These Care Teams include your primary Cardiologist (physician) and Advanced Practice Providers (APPs -  Physician Assistants and Nurse Practitioners) who all work together to provide you with the care you need, when you need it.  Your next appointment:   6 month(s)  Provider:   You will see one of the following Advanced Practice Providers on your designated Care Team:   Francis Dowse, [place]" [place], [place] Sherie Don, NP

## 2023-04-13 NOTE — Progress Notes (Signed)
HEART AND VASCULAR CENTER   MULTIDISCIPLINARY HEART VALVE CLINIC                                     Cardiology Office Note:    Date:  04/14/2023   ID:  Gregory Mccormick, DOB 07-Aug-1929, MRN [id number]  PCP:  Tally Joe, MD  [facility] Cardiologist:  Lance Muss, MD  / Dr. Excell Seltzer & Dr. Laneta Simmers (TAVR) [facility] Electrophysiologist:  Lanier Prude, MD   Referring MD: Tally Joe, MD   Follow up   History of Present Illness:    Gregory Mccormick is a 89 y.o. male with a hx of severe Gregory with prolapse/flail leaflet, CAD s/p LAD and LCX stenting in 2014, paroxysmal atrial fibrillation on Eliquis, CHB s/p Medtronic PPM, aortic root dilation, ischemic CM with recovered EF, HTN, HLD, CKD stage IIIb, mitral regurgitation and severe aortic stenosis s/p TAVR (04/12/22) who presents to clinic for follow up.    He was hospitalized in 08/2018 with atypical chest pain. Troponins were flat and CTA was negative for PE. However, his EF was noted to be down - EF 30-35%, diffuse HK, moderate AS, mild Gregory, mod LAE, severely dilated RA, mildly dilated RV, mild TR, PASP 46, elevated CVP. EF recovered to normal by echo in 01/2021 and aortic stenosis continued to progress.    Gregory Mccormick has remained remarkably active and functional. He continues to mow his yard and rides on a tractor and does a lot of work around his property. Last year, he developed increasing shortness of breath and fatigue. TTE 01/24/22 showed EF 55%, severe AS with a mean grad , peak grad 58.39mmHg, AVA 1.11cm2, DVI 0.18, SVI 45 as well as severe Gregory with prolapse and possible flail segment of the posterior leaflet with anteriorly directed jet. [facility] 01/27/22 showed CAD with ostial and proximal LCX stenosis at 60%, 2nd marginal lesion at 80%, pRCA and mRCA at 25%, with hemodynamic consistent with pulmonary HTN. Given the lack of angina, intervention was deferred.   He was evaluated by the multidisciplinary valve team and underwent  successful TAVR with a 26 mm Edwards Sapien 3 Ultra Resilia THV via the TF approach on 04/12/22. An extra CC was added during deployment. Post operative echo showed EF 55-60%, normally functioning TAVR with a mean gradient of 11 mmHg and no PVL. He was discharged on home Eliquis. 1 month echo showed EF 55%, normally functioning TAVR with a mean gradient of 8 mm hg and no PVL as well as moderate-severe Gregory and moderate TR.  Had some chest pain in ED 07/01/22 troponin negative. Imdur added and coreg increased. Creat up 2.01 from 1.49 on higher dose lasix 40 mg daily. Seen in ER on 01/31/23 for cough and SOB and treated for bronchitis with improvement.  I saw him 03/31/23 for follow up. Noted to have worsening swelling and more fatigue, decreased exercise tolerance. I discussed his severe Gregory with the valve team and he was not felt to be a good candidate given the degree of leaflet calcification and tissue integrity.   Today the patient presents to clinic for follow up. He is doing better since last visit. He is taking lasix 40mg  alternating with lasix 60mg  daily. Leg swelling has been better.   Past Medical History:  Diagnosis Date   Anemia, unspecified    Arthritis    "right knee" (03/28/2013   CAD  in native artery    Cardiomyopathy (HCC)    EF 40%:  ECHO 11/25/2015 EF 55%-60%   Carpal tunnel syndrome, bilateral    Chronic diastolic (congestive) heart failure (HCC)    Chronic kidney disease (CKD), stage III (moderate) (HCC)    Epistaxis    Essential hypertension, benign    takes Metoprolol daily   H/O hiatal hernia    History of kidney stones    Hyperlipidemia    takes Lipitor daily   Mitral regurgitation    OSA on CPAP    wears CPAP 02/03/16   Pacemaker    MDT   PAF (paroxysmal atrial fibrillation) (HCC)    Paroxysmal ventricular tachycardia (HCC)    PONV (postoperative nausea and vomiting)    PVC's (premature ventricular contractions)    S/P TAVR (transcatheter aortic valve replacement)  04/12/2022   s/p TAVR with a 26mm Melodye Ped (with an extra CC) via the TF approach by Dr. Excell Seltzer & Laneta Simmers.   Severe aortic stenosis    Vitamin D deficiency     Past Surgical History:  Procedure Laterality Date   CARDIAC CATHETERIZATION  03/21/2013   CARDIOVERSION N/A 10/09/2018   Procedure: CARDIOVERSION;  Surgeon: Wendall Stade, MD;  Location: [facility] ENDOSCOPY;  Service: Cardiovascular;  Laterality: N/A;   CARPAL TUNNEL RELEASE Right 02/05/2016   Procedure: RIGHT LIMITED OPEN CARPAL TUNNEL RELEASE;  Surgeon: Dominica Severin, MD;  Location: [facility] OR;  Service: Orthopedics;  Laterality: Right;   CARPAL TUNNEL RELEASE Left 04/21/2016   Procedure: CARPAL TUNNEL RELEASE;  Surgeon: Dominica Severin, MD;  Location: [facility] OR;  Service: Orthopedics;  Laterality: Left;   CATARACT EXTRACTION W/ INTRAOCULAR LENS  IMPLANT, BILATERAL Bilateral 2000's   COLONOSCOPY     CORONARY ANGIOPLASTY WITH STENT PLACEMENT  03/28/2013   "3 stents" (03/28/2013)   HIP ARTHROPLASTY Right 04/30/2019   Procedure: ARTHROPLASTY BIPOLAR HIP (HEMIARTHROPLASTY);  Surgeon: Durene Romans, MD;  Location: [facility] ORS;  Service: Orthopedics;  Laterality: Right;   INSERT / REPLACE / REMOVE PACEMAKER     INTRAOPERATIVE TRANSTHORACIC ECHOCARDIOGRAM N/A 04/12/2022   Procedure: INTRAOPERATIVE TRANSTHORACIC ECHOCARDIOGRAM;  Surgeon: Tonny Bollman, MD;  Location: [facility] OR;  Service: Open Heart Surgery;  Laterality: N/A;   KNEE CARTILAGE SURGERY Left 1972   "cut it open" (03/28/2013)   KNEE CARTILAGE SURGERY Right ~ 1977   "cut it open" (03/28/2013)   PACEMAKER INSERTION  10/05/10   MDT Adapta L implanted by Dr [name] for mobitz II second degree AV block   PERCUTANEOUS CORONARY ROTOBLATOR INTERVENTION (PCI-R) N/A 03/28/2013   Procedure: PERCUTANEOUS CORONARY ROTOBLATOR INTERVENTION (PCI-R);  Surgeon: Corky Crafts, MD;  Location: [facility] CATH LAB;  Service: Cardiovascular;  Laterality: N/A;   RIGHT/LEFT HEART CATH AND CORONARY ANGIOGRAPHY N/A 01/27/2022    Procedure: RIGHT/LEFT HEART CATH AND CORONARY ANGIOGRAPHY;  Surgeon: Corky Crafts, MD;  Location: [facility] INVASIVE CV LAB;  Service: Cardiovascular;  Laterality: N/A;   SEPTOPLASTY N/A 02/05/2016   Procedure: SEPTOPLASTY WITH INTRANASAL SKIN GRAFT;  Surgeon: Drema Halon, MD;  Location: [facility] OR;  Service: ENT;  Laterality: N/A;   SHOULDER HEMI-ARTHROPLASTY Right 1987   "got hit by sled & dragged down sheet > 200 feet; tore shoulder up" (03/28/2013)   SHOULDER SURGERY Right 1970's   "pulled muscle apart; sewed it back together" (03/28/2013   TOTAL KNEE ARTHROPLASTY Right 2007   TOTAL KNEE ARTHROPLASTY  11/26/2012   Procedure: TOTAL KNEE ARTHROPLASTY;  Surgeon: Loanne Drilling, MD;  Location: [facility] ORS;  Service: Orthopedics;  Laterality: Left;   TRANSCATHETER AORTIC VALVE REPLACEMENT, TRANSFEMORAL N/A 04/12/2022   Procedure: Transcatheter Aortic Valve Replacement, Transfemoral;  Surgeon: Tonny Bollman, MD;  Location: [facility] OR;  Service: Open Heart Surgery;  Laterality: N/A;   TRANSURETHRAL RESECTION OF PROSTATE N/A 12/27/2017   Procedure: TRANSURETHRAL RESECTION OF THE PROSTATE (TURP);  Surgeon: Crista Elliot, MD;  Location: [facility];  Service: Urology;  Laterality: N/A;    Current Medications: Current Meds  Medication Sig   albuterol (VENTOLIN HFA) 108 (90 Base) MCG/ACT inhaler Inhale 2 puffs into the lungs every 6 (six) hours as needed for wheezing or shortness of breath.   amiodarone (PACERONE) 200 MG tablet TAKE 1 TABLET BY MOUTH DAILY   atorvastatin (LIPITOR) 10 MG tablet TAKE 1 TABLET BY MOUTH DAILY AT  6 PM.   carvedilol (COREG) 12.5 MG tablet Take 1 tablet (12.5 mg total) by mouth 2 (two) times daily with a meal.   ELIQUIS 2.5 MG TABS tablet TAKE 1 TABLET BY MOUTH TWICE  DAILY   furosemide (LASIX) 40 MG tablet Take by mouth. TAKE 40 MG EVERY OTHER DAY WITH ALTERNATING 60 MG ON THE OTHER DAYS.   isosorbide mononitrate (IMDUR) 30 MG 24 hr tablet Take 1 tablet (30 mg  total) by mouth at bedtime.   latanoprost (XALATAN) 0.005 % ophthalmic solution Place 1 drop into the left eye at bedtime.   meclizine (ANTIVERT) 25 MG tablet as needed for dizziness.   Multiple Vitamins-Minerals (PRESERVISION AREDS 2+MULTI VIT PO) Take 1 capsule by mouth in the morning and at bedtime.   nitroGLYCERIN (NITROSTAT) 0.4 MG SL tablet Place 0.4 mg under the tongue every 5 (five) minutes as needed for chest pain.   Omega-3 Fatty Acids (FISH OIL) 1000 MG CAPS Take 1,000 mg by mouth daily.   sacubitril-valsartan (ENTRESTO) 24-26 MG TAKE 1 TABLET BY MOUTH TWICE  DAILY   timolol (TIMOPTIC) 0.5 % ophthalmic solution Place 1 drop into the right eye 2 (two) times daily.   Current Facility-Administered Medications for the 04/14/23 encounter (Office Visit) with [facility]  Medication   betamethasone acetate-betamethasone sodium phosphate (CELESTONE) injection 3 mg   betamethasone acetate-betamethasone sodium phosphate (CELESTONE) injection 3 mg   dexamethasone (DECADRON) injection 4 mg     Allergies:   Penicillins and Hydromorphone hcl   Social History   Socioeconomic History   Marital status: Widowed    Spouse name: Not on file   Number of children: 3   Years of education: Not on file   Highest education level: Not on file  Occupational History   Occupation: retired    Associate Professor: [employer]  Tobacco Use   Smoking status: Former    Packs/day: 1.00    Years: 26.00    Additional pack years: 0.00    Total pack years: 26.00    Types: Cigarettes    Quit date: 10/31/1973    Years since quitting: 49.4   Smokeless tobacco: Never  Vaping Use   Vaping Use: Never used  Substance and Sexual Activity   Alcohol use: No   Drug use: No   Sexual activity: Not Currently  Other Topics Concern   Not on file  Social History Narrative   Not on file   Social Determinants of Health   Financial Resource Strain: Not on file  Food Insecurity: Not on file   Transportation Needs: Not on file  Physical Activity: Not on file  Stress: Not on file  Social Connections: Not on  file     Family History: The patient's family history includes CAD (age of onset: 60) in his brother; Cancer in an other family member; Heart disease in an other family member; Stroke in his brother. There is no history of Heart attack or Hypertension.  ROS:   Please see the history of present illness.    All other systems reviewed and are negative.  EKGs/Labs/Other Studies Reviewed:    The following studies were reviewed today:   TAVR OPERATIVE NOTE     Date of Procedure:                04/12/2022   Preoperative Diagnosis:      Severe Aortic Stenosis    Postoperative Diagnosis:    Same    Procedure:        Transcatheter Aortic Valve Replacement - Percutaneous Right Transfemoral Approach             Edwards Sapien 3 Ultra Resilia THV (size 26 mm, model # 9755RSL, serial # [id number])              Co-Surgeons:                        [name], MD and Tonny Bollman, MD   Anesthesiologist:                  Adonis Huguenin, MD   Echocardiographer:              Riley Lam, MD   Pre-operative Echo Findings: Severe aortic stenosis Normal left ventricular systolic function   Post-operative Echo Findings: No paravalvular leak Unchanged left ventricular systolic function _____________     Echo 04/13/22:  IMPRESSIONS   1. The aortic valve has been replaced by a 26 mm Edwards Sapien Valve.  Aortic valve regurgitation is not visualized (neither central no PVL).  Effective orifice area, by VTI measures 2.02 cm. Aortic valve mean  gradient measures 11.0 mmHg. Peak gradient  20 mm Hg. DVI 0.4.   2. Left ventricular ejection fraction, by estimation, is 55 to 60%. The  left ventricle has normal function. The left ventricle has no regional  wall motion abnormalities. There is mild concentric left ventricular  hypertrophy. Left ventricular diastolic   parameters are indeterminate.   3. The mitral valve is degenerative. Moderate to severe mitral valve  regurgitation, without full assessment. There is splay artifact. Prior  studies have shown prolapse; this was not well demonstrated in this study.   4. Right ventricular systolic function is normal. The right ventricular  size is normal. There is severely elevated pulmonary artery systolic  pressure. The estimated right ventricular systolic pressure is 60.7 mmHg.   5. Tricuspid valve regurgitation is mild to moderate.   6. The inferior vena cava is dilated in size with <50% respiratory  variability, suggesting right atrial pressure of 15 mmHg.   Comparison(s): Echo findings are consistent with normal structure and  function of the aortic valve prosthesis. Mitral regurgitation is at least  moderate to severe.   ____________________  Echo 05/20/22 IMPRESSIONS  1. Left ventricular ejection fraction, by estimation, is 55%. The left  ventricle has normal function. The left ventricle has no regional wall  motion abnormalities. There is mild concentric left ventricular  hypertrophy. Left ventricular diastolic  parameters are indeterminate.   2. Right ventricular systolic function is normal. The right ventricular  size is normal. There is normal pulmonary artery systolic pressure. The  estimated right ventricular systolic pressure is 34.8 mmHg.   3. Left atrial size was moderately dilated.   4. Right atrial size was mildly dilated.   5. The mitral valve is degenerative. Suspect moderate mitral valve  regurgitation, eccentric with horizontal color splay. No evidence of  mitral stenosis.   6. Tricuspid valve regurgitation is mild to moderate.   7. The aortic valve has been replaced by a 26 mm Edwards Sapien Valve.  Aortic valve regurgitation is not visualized. Effective orifice area, by  VTI measures 2.28 cm. Aortic valve mean gradient measures 8.0 mmHg. DVI  0.63. Peak gradient 15 mm Hg.    8. Echo findings are consistent with normal structure and function of the  aortic valve prosthesis.   Comparison(s): Stable prosthetic aortic findings. Largely similar mitral regurgitation findings.    _______________________  Echo 03/31/23 IMPRESSIONS   1. Left ventricular ejection fraction, by estimation, is 55 to 60%. The  left ventricle has normal function. The left ventricle has no regional  wall motion abnormalities. Left ventricular diastolic parameters were  normal.   2. Right ventricular systolic function is normal. The right ventricular  size is normal.   3. There is likely severe very eccentric mitral regurgitation with Coanda  effect.   4. Tricuspid valve regurgitation is mild to moderate.   5. The aortic valve has been repaired/replaced. Aortic valve  regurgitation is not visualized. No aortic stenosis is present. Echo  findings are consistent with a small perivalvular leak of the aortic  prosthesis.   6. The inferior vena cava is normal in size with greater than 50%  respiratory variability, suggesting right atrial pressure of 3 mmHg.   EKG:  EKG is NOT ordered today.    Recent Labs: 10/06/2022: TSH 4.110 01/31/2023: ALT 11; BUN 14; Creatinine, Ser 1.38; Hemoglobin 13.0; Platelets 121; Potassium 3.6; Sodium 136  Recent Lipid Panel    Component Value Date/Time   CHOL 108 04/30/2019 0518   CHOL 108 11/17/2017 0848   TRIG 29 04/30/2019 0518   HDL 52 04/30/2019 0518   HDL 56 11/17/2017 0848   CHOLHDL 2.1 04/30/2019 0518   VLDL 6 04/30/2019 0518   LDLCALC 50 04/30/2019 0518   LDLCALC 39 11/17/2017 0848     Risk Assessment/Calculations:    CHA2DS2-VASc Score = 5   This indicates a 7.2% annual risk of stroke. The patient's score is based upon: CHF History: 1 HTN History: 1 Diabetes History: 0 Stroke History: 0 Vascular Disease History: 1 Age Score: 2 Gender Score: 0      Physical Exam:    VS:  BP 128/74   Pulse (!) 52   Ht 5\' 8"  (1.727 m)   Wt  181 lb (82.1 kg)   SpO2 91%   BMI 27.52 kg/m     Wt Readings from Last 3 Encounters:  04/14/23 181 lb (82.1 kg)  04/07/23 179 lb 12.8 oz (81.6 kg)  03/31/23 189 lb (85.7 kg)     GEN:  Well nourished, well developed in no acute distress HEENT: Normal NECK: No JVD LYMPHATICS: No lymphadenopathy CARDIAC: RRR, 3/6 holosystolic murmur at apex. No rubs or gallops RESPIRATORY:  Clear to auscultation without rales, wheezing or rhonchi  ABDOMEN: Soft, non-tender, non-distended MUSCULOSKELETAL:  Trace pretibial edema, L>R. No deformity  SKIN: Warm and dry.   NEUROLOGIC:  Alert and oriented x 3 PSYCHIATRIC:  Normal affect   ASSESSMENT:    1. Nonrheumatic mitral valve regurgitation   2. S/P TAVR (transcatheter aortic valve  replacement)   3. Coronary artery disease involving native coronary artery of native heart without angina pectoris   4. Presence of cardiac pacemaker   5. Chronic systolic CHF (congestive heart failure) (HCC)     PLAN:    In order of problems listed above:  Severe mitral regurgitation: most recent echo showed likely severe very eccentric Gregory with Coanda effect. He has had worsening symptoms of LE edema, SOB and fatigue. Unfortunately, he is not a good candidate for potential mTEER given leaflet integrity and leaflet calcium. Plan medical therapy.    Severe AS s/p TAVR: stable. He has azithromycin for SBE prophylaxis due to a PCN allergy. Continue on home Eliquis 5mg  BID.   CAD: [facility] 01/27/22 showed CAD with ostial and proximal LCX stenosis at 60%, 2nd marginal lesion at 80%, pRCA and mRCA at 25%. Given the lack of angina, intervention was deferred. Continue medical therapy.    PAF: continue amiodarone and Eliquis 2.5mg  BID (appropriate dosing based on age and renal function).    HTN: BP well controlled today. No changes made   CHB s/p Medtronic PPM: followed by Dr. [name]    Hx of ischemic CM/HFimpEF: EF now normalized. Continue Entresto and Coreg 3.125mg  BID. He  appears euvolemic. LE edema improved on lasix 40mg  alternating with 60mg  daily. Check BMET today   Medication Adjustments/Labs and Tests Ordered: Current medicines are reviewed at length with the patient today.  Concerns regarding medicines are outlined above.  Orders Placed This Encounter  Procedures   Basic metabolic panel   No orders of the defined types were placed in this encounter.   Patient Instructions  Medication Instructions:  Your physician recommends that you continue on your current medications as directed. Please refer to the Current Medication list given to you today.  *If you need a refill on your cardiac medications before your next appointment, please call your pharmacy*   Lab Work: TODAY: BMET If you have labs (blood work) drawn today and your tests are completely normal, you will receive your results only by: MyChart Message (if you have MyChart) OR A paper copy in the mail If you have any lab test that is abnormal or we need to change your treatment, we will call you to review the results.   Testing/Procedures: NONE   Follow-Up: At [facility] you and your health needs are our priority.  As part of our continuing mission to provide you with exceptional heart care, we have created designated Provider Care Teams.  These Care Teams include your primary Cardiologist (physician) and Advanced Practice Providers (APPs -  Physician Assistants and Nurse Practitioners) who all work together to provide you with the care you need, when you need it.  We recommend signing up for the patient portal called "MyChart".  Sign up information is provided on this After Visit Summary.  MyChart is used to connect with patients for Virtual Visits (Telemedicine).  Patients are able to view lab/test results, encounter notes, upcoming appointments, etc.  Non-urgent messages can be sent to your provider as well.   To learn more about what you can do with MyChart, go to  [email or web address].    Your next appointment:   KEEP SCHEDULED FOLLOW-UP   Signed, Cline Crock, PA-C  04/14/2023 9:34 AM    [facility]

## 2023-04-14 ENCOUNTER — Ambulatory Visit: Payer: Medicare Other | Attending: Cardiovascular Disease | Admitting: Physician Assistant

## 2023-04-14 VITALS — BP 128/74 | HR 52 | Ht 68.0 in | Wt 181.0 lb

## 2023-04-14 DIAGNOSIS — I5022 Chronic systolic (congestive) heart failure: Secondary | ICD-10-CM

## 2023-04-14 DIAGNOSIS — Z952 Presence of prosthetic heart valve: Secondary | ICD-10-CM | POA: Diagnosis not present

## 2023-04-14 DIAGNOSIS — Z950 Presence of cardiac pacemaker: Secondary | ICD-10-CM

## 2023-04-14 DIAGNOSIS — I340 Nonrheumatic mitral (valve) insufficiency: Secondary | ICD-10-CM | POA: Diagnosis not present

## 2023-04-14 DIAGNOSIS — I2510 Atherosclerotic heart disease of native coronary artery without angina pectoris: Secondary | ICD-10-CM

## 2023-04-14 NOTE — Patient Instructions (Signed)
Medication Instructions:  Your physician recommends that you continue on your current medications as directed. Please refer to the Current Medication list given to you today.  *If you need a refill on your cardiac medications before your next appointment, please call your pharmacy*   Lab Work: TODAY: BMET If you have labs (blood work) drawn today and your tests are completely normal, you will receive your results only by: MyChart Message (if you have MyChart) OR A paper copy in the mail If you have any lab test that is abnormal or we need to change your treatment, we will call you to review the results.   Testing/Procedures: NONE   Follow-Up: At [facility], you and your health needs are our priority.  As part of our continuing mission to provide you with exceptional heart care, we have created designated Provider Care Teams.  These Care Teams include your primary Cardiologist (physician) and Advanced Practice Providers (APPs -  Physician Assistants and Nurse Practitioners) who all work together to provide you with the care you need, when you need it.  We recommend signing up for the patient portal called "MyChart".  Sign up information is provided on this After Visit Summary.  MyChart is used to connect with patients for Virtual Visits (Telemedicine).  Patients are able to view lab/test results, encounter notes, upcoming appointments, etc.  Non-urgent messages can be sent to your provider as well.   To learn more about what you can do with MyChart, go to [email or web address].    Your next appointment:   KEEP SCHEDULED FOLLOW-UP

## 2023-04-15 LAB — BASIC METABOLIC PANEL
BUN/Creatinine Ratio: 12 (ref 10–24)
BUN: 20 mg/dL (ref 10–36)
CO2: 26 mmol/L (ref 20–29)
Calcium: 9.1 mg/dL (ref 8.6–10.2)
Chloride: 103 mmol/L (ref 96–106)
Creatinine, Ser: 1.67 mg/dL — ABNORMAL HIGH (ref 0.76–1.27)
Glucose: 90 mg/dL (ref 70–99)
Potassium: 3.9 mmol/L (ref 3.5–5.2)
Sodium: 142 mmol/L (ref 134–144)
eGFR: 38 mL/min/{1.73_m2} — ABNORMAL LOW (ref >59)

## 2023-04-17 ENCOUNTER — Telehealth: Payer: Self-pay

## 2023-04-17 DIAGNOSIS — I5022 Chronic systolic (congestive) heart failure: Secondary | ICD-10-CM

## 2023-04-17 MED ORDER — FUROSEMIDE 20 MG PO TABS
20.0000 mg | ORAL_TABLET | Freq: Every day | ORAL | 3 refills | Status: DC
Start: 2023-04-17 — End: 2023-12-12

## 2023-04-17 NOTE — Telephone Encounter (Signed)
Patient aware of lab results. He will continue alternating lasix 40-mg/60-mg. He states he needs refill on lasix. Refill sent to pharmacy on file

## 2023-04-17 NOTE — Telephone Encounter (Signed)
-----   Message from Janetta Hora, PA-C sent at 04/15/2023 10:04 AM EDT ----- Kidney function and electrolytes are stable on alternating lasix 40mg /60mg . Continue this.

## 2023-04-20 ENCOUNTER — Telehealth: Payer: Self-pay | Admitting: Physician Assistant

## 2023-04-20 NOTE — Telephone Encounter (Signed)
Spoke with the pt, explained Cline Crock PA-C recommendation:    Please see if he can come into see me tomorrow in the office at 9am.   Pt is scheduled with Cline Crock, PA-C on 6/21 @ 0900. Pt voiced understanding.

## 2023-04-20 NOTE — Telephone Encounter (Signed)
  STAT if patient feels like he/she is going to faint   Are you dizzy now? Yes   Do you feel faint or have you passed out?   Do you have any other symptoms?   Have you checked your HR and BP (record if available)? BP 128/72 HR 93 O2 level- 93%  Pt said, when he woke up this morning, he's been feeling dizzy when he gets up. He said, sitting down he is ok but the moment he stand up everything is moving around him. He wants to get a call back from [facility].he gave his phone number [phone]

## 2023-04-20 NOTE — Telephone Encounter (Signed)
Spoke to the patient, he  experienced dizzy spells since yesterday morning. However, this morning his dizziness has worsen, pt stated when he closes his eyes the dizziness "goes away" . He denies any other symptoms. Pt stated he there maybe a problem with his ears. He did call ENT this morning however, his appointment is the middle of July. Advised pt to contact PCP, he stated " I want to speak with Dr. Eldridge Dace or Cline Crock, PA-C. Advised pt MD and APP are currently not in the office. Pt stated "if my dizziness worsens I will call 911".  Will forward to APP for advise.

## 2023-04-21 ENCOUNTER — Ambulatory Visit: Payer: Medicare Other

## 2023-04-21 ENCOUNTER — Ambulatory Visit (INDEPENDENT_AMBULATORY_CARE_PROVIDER_SITE_OTHER): Payer: Medicare Other | Admitting: Podiatry

## 2023-04-21 DIAGNOSIS — M7751 Other enthesopathy of right foot: Secondary | ICD-10-CM

## 2023-04-21 MED ORDER — TRIAMCINOLONE ACETONIDE 10 MG/ML IJ SUSP
10.0000 mg | Freq: Once | INTRAMUSCULAR | Status: AC
Start: 2023-04-21 — End: 2023-04-21
  Administered 2023-04-21: 10 mg

## 2023-04-21 NOTE — Patient Instructions (Signed)

## 2023-04-25 NOTE — Progress Notes (Signed)
Subjective: Chief Complaint  Patient presents with   Ankle Pain    Right ankle pain at the lateral aspect of the ankle. Patient was seen May 2024 and was given a steroid injection which helped with the pain.    89 year old male presents the office with above concerns.  He said the injection helped quite a bit on the right side he is interested in another injection today.  No recent injury or changes otherwise.    Objective: AAO x3, NAD DP/PT pulses palpable bilaterally, CRT less than 3 seconds Majority tenderness is localized to the lateral aspect of the ankle and sinus tarsi area there is trace edema.  There is no erythema or warmth.  Not able to elicit any area of pinpoint tenderness.  Flexor, extensor tendons are intact.  MMT 5/5. No pain with calf compression, swelling, warmth, erythema  Assessment: 89 year old male with capsulitis right ankle  Plan: -All treatment options discussed with the patient including all alternatives, risks, complications.  -Patient wants to proceed with another steroid injection.  Understand risks he agrees to proceed.  I cleaned the skin with Betadine, alcohol and mixture 1 cc Kenalog 10, 0.5 cc of Marcaine plain, 0.5 cc of lidocaine plain was infiltrated into the sinus tarsi without complications.  Postinjection care discussed.  Tolerated well. -Patient encouraged to call the office with any questions, concerns, change in symptoms.   Vivi Barrack DPM

## 2023-06-09 ENCOUNTER — Ambulatory Visit: Payer: Medicare Other | Admitting: Interventional Cardiology

## 2023-06-13 ENCOUNTER — Ambulatory Visit: Payer: Medicare Other | Attending: Interventional Cardiology | Admitting: Interventional Cardiology

## 2023-06-13 ENCOUNTER — Encounter: Payer: Self-pay | Admitting: Interventional Cardiology

## 2023-06-13 VITALS — BP 122/78 | HR 72 | Ht 68.0 in | Wt 181.6 lb

## 2023-06-13 DIAGNOSIS — I2510 Atherosclerotic heart disease of native coronary artery without angina pectoris: Secondary | ICD-10-CM | POA: Diagnosis not present

## 2023-06-13 DIAGNOSIS — I10 Essential (primary) hypertension: Secondary | ICD-10-CM

## 2023-06-13 DIAGNOSIS — I340 Nonrheumatic mitral (valve) insufficiency: Secondary | ICD-10-CM

## 2023-06-13 DIAGNOSIS — Z952 Presence of prosthetic heart valve: Secondary | ICD-10-CM

## 2023-06-13 DIAGNOSIS — I480 Paroxysmal atrial fibrillation: Secondary | ICD-10-CM

## 2023-06-13 DIAGNOSIS — Z950 Presence of cardiac pacemaker: Secondary | ICD-10-CM | POA: Diagnosis not present

## 2023-06-13 NOTE — Progress Notes (Signed)
Cardiology Office Note   Date:  06/13/2023   ID:  Gregory Mccormick, DOB 06/24/1929, MRN [id number]  PCP:  Tally Joe, MD    No chief complaint on file.  CAD  Wt Readings from Last 3 Encounters:  06/13/23 181 lb 9.6 oz (82.4 kg)  04/14/23 181 lb (82.1 kg)  04/07/23 179 lb 12.8 oz (81.6 kg)       History of Present Illness: Gregory Mccormick is a 88 y.o. male  with a hx of severe MR with prolapse/flail leaflet, CAD s/p LAD and LCX atherectomy and stenting in 2014, paroxysmal atrial fibrillation on Eliquis, CHB s/p Medtronic PPM, aortic root dilation, ischemic CM with recovered EF, HTN, HLD, CKD stage IIIb, mitral regurgitation and severe aortic stenosis s/p TAVR (04/12/22)    TTE 01/24/22 showed EF 55%, severe AS with a mean grad , peak grad 58.34mmHg, AVA 1.11cm2, DVI 0.18, SVI 45 as well as severe MR with prolapse and possible flail segment of the posterior leaflet with anteriorly directed jet. [facility] 01/27/22 showed CAD with ostial and proximal LCX stenosis at 60%, 2nd marginal lesion at 80%, pRCA and mRCA at 25%, with hemodynamic consistent with pulmonary HTN. Given the lack of angina, intervention was deferred.    He was evaluated by the multidisciplinary valve team and underwent successful TAVR with a 26 mm Edwards Sapien 3 Ultra Resilia THV via the TF approach on 04/12/22. An extra CC was added during deployment. Post operative echo showed EF 55-60%, normally functioning TAVR with a mean gradient of 11 mmHg and no PVL. He was discharged on home Eliquis. 1 month echo showed EF 55%, normally functioning TAVR with a mean gradient of 8 mm hg and no PVL as well as moderate-severe MR and moderate TR.   Had some chest pain in ED 07/01/22 troponin negative. Imdur added and coreg increased. Creat up 2.01 from 1.49 on higher dose lasix 40 mg daily. Seen in ER on 01/31/23 for cough and SOB and treated for bronchitis with improvement.  Noted some more swelling and fatigue in May 2024 with  decreased exercise tolerance.  His severe MR diagnosis was discussed with the valve team and he was not felt to be a good candidate for repair given the degree of leaflet calcification and tissue integrity.  Echo in 2024 showed: "Left ventricular ejection fraction, by estimation, is 55 to 60%. The  left ventricle has normal function. The left ventricle has no regional  wall motion abnormalities. Left ventricular diastolic parameters were  normal.   2. Right ventricular systolic function is normal. The right ventricular  size is normal.   3. There is likely severe very eccentric mitral regurgitation with Coanda  effect.   4. Tricuspid valve regurgitation is mild to moderate.   5. The aortic valve has been repaired/replaced. Aortic valve  regurgitation is not visualized. No aortic stenosis is present. Echo  findings are consistent with a small perivalvular leak of the aortic  prosthesis.   6. The inferior vena cava is normal in size with greater than 50%  respiratory variability, suggesting right atrial pressure of 3 mmHg. "  Denies : Chest pain. Dizziness. Nitroglycerin use. Orthopnea. Palpitations. Paroxysmal nocturnal dyspnea. Syncope.    Hurt right hand and needed pressure dressing.  Leg edema towards the end of the day.   Still walks his dog and mows the lawn.     Past Medical History:  Diagnosis Date   Anemia, unspecified    Arthritis    "  right knee" (03/28/2013   CAD in native artery    Cardiomyopathy (HCC)    EF 40%:  ECHO 11/25/2015 EF 55%-60%   Carpal tunnel syndrome, bilateral    Chronic diastolic (congestive) heart failure (HCC)    Chronic kidney disease (CKD), stage III (moderate) (HCC)    Epistaxis    Essential hypertension, benign    takes Metoprolol daily   H/O hiatal hernia    History of kidney stones    Hyperlipidemia    takes Lipitor daily   Mitral regurgitation    OSA on CPAP    wears CPAP 02/03/16   Pacemaker    MDT   PAF (paroxysmal atrial  fibrillation) (HCC)    Paroxysmal ventricular tachycardia (HCC)    PONV (postoperative nausea and vomiting)    PVC's (premature ventricular contractions)    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 Excell Seltzer & Laneta Simmers.   Severe aortic stenosis    Vitamin D deficiency     Past Surgical History:  Procedure Laterality Date   CARDIAC CATHETERIZATION  03/21/2013   CARDIOVERSION N/A 10/09/2018   Procedure: CARDIOVERSION;  Surgeon: Wendall Stade, MD;  Location: [facility] ENDOSCOPY;  Service: Cardiovascular;  Laterality: N/A;   CARPAL TUNNEL RELEASE Right 02/05/2016   Procedure: RIGHT LIMITED OPEN CARPAL TUNNEL RELEASE;  Surgeon: Dominica Severin, MD;  Location: [facility] OR;  Service: Orthopedics;  Laterality: Right;   CARPAL TUNNEL RELEASE Left 04/21/2016   Procedure: CARPAL TUNNEL RELEASE;  Surgeon: Dominica Severin, MD;  Location: [facility] OR;  Service: Orthopedics;  Laterality: Left;   CATARACT EXTRACTION W/ INTRAOCULAR LENS  IMPLANT, BILATERAL Bilateral 2000's   COLONOSCOPY     CORONARY ANGIOPLASTY WITH STENT PLACEMENT  03/28/2013   "3 stents" (03/28/2013)   HIP ARTHROPLASTY Right 04/30/2019   Procedure: ARTHROPLASTY BIPOLAR HIP (HEMIARTHROPLASTY);  Surgeon: Durene Romans, MD;  Location: [facility] ORS;  Service: Orthopedics;  Laterality: Right;   INSERT / REPLACE / REMOVE PACEMAKER     INTRAOPERATIVE TRANSTHORACIC ECHOCARDIOGRAM N/A 04/12/2022   Procedure: INTRAOPERATIVE TRANSTHORACIC ECHOCARDIOGRAM;  Surgeon: Tonny Bollman, MD;  [facility];  Service: Open Heart Surgery;  Laterality: N/A;   KNEE CARTILAGE SURGERY Left 1972   "cut it open" (03/28/2013)   KNEE CARTILAGE SURGERY Right ~ 1977   "cut it open" (03/28/2013)   PACEMAKER INSERTION  10/05/10   MDT Adapta L implanted by Dr [name] for mobitz II second degree AV block   PERCUTANEOUS CORONARY ROTOBLATOR INTERVENTION (PCI-R) N/A 03/28/2013   Procedure: PERCUTANEOUS  CORONARY ROTOBLATOR INTERVENTION (PCI-R);  Surgeon: Corky Crafts, MD;  Location: [facility] CATH LAB;  Service: Cardiovascular;  Laterality: N/A;   RIGHT/LEFT HEART CATH AND CORONARY ANGIOGRAPHY N/A 01/27/2022   Procedure: RIGHT/LEFT HEART CATH AND CORONARY ANGIOGRAPHY;  Surgeon: Corky Crafts, MD;  Location: [facility] INVASIVE CV LAB;  Service: Cardiovascular;  Laterality: N/A;   SEPTOPLASTY N/A 02/05/2016   Procedure: SEPTOPLASTY WITH INTRANASAL SKIN GRAFT;  Surgeon: Drema Halon, MD;  Location: [facility] OR;  Service: ENT;  Laterality: N/A;   SHOULDER HEMI-ARTHROPLASTY Right 1987   "got hit by sled & dragged down sheet > 200 feet; tore shoulder up" (03/28/2013)   SHOULDER SURGERY Right 1970's   "pulled muscle apart; sewed it back together" (03/28/2013   TOTAL KNEE ARTHROPLASTY Right 2007   TOTAL KNEE ARTHROPLASTY  11/26/2012   Procedure: TOTAL KNEE ARTHROPLASTY;  Surgeon: Loanne Drilling, MD;  Location: [facility] ORS;  Service: Orthopedics;  Laterality: Left;   TRANSCATHETER AORTIC VALVE REPLACEMENT, TRANSFEMORAL N/A 04/12/2022   Procedure: Transcatheter Aortic Valve Replacement, Transfemoral;  Surgeon: Tonny Bollman, MD;  Location: [facility] OR;  Service: Open Heart Surgery;  Laterality: N/A;   TRANSURETHRAL RESECTION OF PROSTATE N/A 12/27/2017   Procedure: TRANSURETHRAL RESECTION OF THE PROSTATE (TURP);  Surgeon: Crista Elliot, MD;  Location: [facility];  Service: Urology;  Laterality: N/A;     Current Outpatient Medications  Medication Sig Dispense Refill   albuterol (VENTOLIN HFA) 108 (90 Base) MCG/ACT inhaler Inhale 2 puffs into the lungs every 6 (six) hours as needed for wheezing or shortness of breath. 8 g 0   amiodarone (PACERONE) 200 MG tablet TAKE 1 TABLET BY MOUTH DAILY 90 tablet 1   atorvastatin (LIPITOR) 10 MG tablet TAKE 1 TABLET BY MOUTH DAILY AT  6 PM. 90 tablet 3   carvedilol (COREG) 12.5 MG tablet Take 1 tablet (12.5 mg total) by mouth 2 (two) times daily with a meal. 60  tablet 0   ELIQUIS 2.5 MG TABS tablet TAKE 1 TABLET BY MOUTH TWICE  DAILY 180 tablet 3   furosemide (LASIX) 40 MG tablet Take by mouth. TAKE 40 MG EVERY OTHER DAY WITH ALTERNATING 60 MG ON THE OTHER DAYS.     isosorbide mononitrate (IMDUR) 30 MG 24 hr tablet Take 1 tablet (30 mg total) by mouth at bedtime. 30 tablet 0   latanoprost (XALATAN) 0.005 % ophthalmic solution Place 1 drop into the left eye at bedtime.     meclizine (ANTIVERT) 25 MG tablet as needed for dizziness.     Multiple Vitamins-Minerals (PRESERVISION AREDS 2+MULTI VIT PO) Take 1 capsule by mouth in the morning and at bedtime.     nitroGLYCERIN (NITROSTAT) 0.4 MG SL tablet Place 0.4 mg under the tongue every 5 (five) minutes as needed for chest pain.     Omega-3 Fatty Acids (FISH OIL) 1000 MG CAPS Take 1,000 mg by mouth daily.     sacubitril-valsartan (ENTRESTO) 24-26 MG TAKE 1 TABLET BY MOUTH TWICE  DAILY 180 tablet 1   timolol (TIMOPTIC) 0.5 % ophthalmic solution Place 1 drop into the right eye 2 (two) times daily.     furosemide (LASIX) 20 MG tablet Take 1 tablet (20 mg total) by mouth daily. Take 1 tablet (20 mg) in addition with (40 mg) for a total of (60 mg) every other day. (Patient not taking: Reported on 06/13/2023) 90 tablet 3   No current facility-administered medications for this visit.    Allergies:   Penicillins and Hydromorphone hcl    Social History:  The patient  reports that he quit smoking about 49 years ago. His smoking use included cigarettes. He started smoking about 75 years ago. He has a 26 pack-year smoking history. He has never used smokeless tobacco. He reports that he does not drink alcohol and does not use drugs.   Family History:  The patient's family history includes CAD (age of onset: 55) in his brother; Cancer in an other family member; Heart disease in an other family member; Stroke in his brother.    ROS:  Please see the history of present illness.   Otherwise, review of systems are positive  for .   All other systems are reviewed and negative.    PHYSICAL EXAM: VS:  BP 122/78   Pulse 72   Ht 5\' 8"  (1.727 m)   Wt 181 lb 9.6 oz (82.4  kg)   SpO2 96%   BMI 27.61 kg/m  , BMI Body mass index is 27.61 kg/m. GEN: Well nourished, well developed, in no acute distress HEENT: normal Neck: no JVD, carotid bruits, or masses Cardiac: RRR; no murmurs, rubs, or gallops,no edema  Respiratory:  clear to auscultation bilaterally, normal work of breathing GI: soft, nontender, nondistended, + BS MS: no deformity or atrophy; right hand bandaged. Right ankle swollen. Varicose veins on both legs Skin: warm and dry, shins discolored- venous stasis Neuro:  Strength and sensation are intact Psych: euthymic mood, full affect    Recent Labs: 10/06/2022: TSH 4.110 01/31/2023: ALT 11; Hemoglobin 13.0; Platelets 121 04/14/2023: BUN 20; Creatinine, Ser 1.67; Potassium 3.9; Sodium 142   Lipid Panel    Component Value Date/Time   CHOL 108 04/30/2019 0518   CHOL 108 11/17/2017 0848   TRIG 29 04/30/2019 0518   HDL 52 04/30/2019 0518   HDL 56 11/17/2017 0848   CHOLHDL 2.1 04/30/2019 0518   VLDL 6 04/30/2019 0518   LDLCALC 50 04/30/2019 0518   LDLCALC 39 11/17/2017 0848     Other studies Reviewed: Additional studies/ records that were reviewed today with results demonstrating: Cr 1.67, LDL 64.   ASSESSMENT AND PLAN:  CAD: No angina. COntinue aggressive secondary prevention.  Severe mitral regurgitation: Appears euvolemic.  Severe AS status post TAVR: Valve functioning well. No signs of CHF.  Needs SBE prophylaxis or dental work.  PAF: low dose eliquis.  Hypertension: The current medical regimen is effective;  continue present plan and medications. Complete heart block status post Medtronic pacemaker: follows with EP.  About 2 year battery life.    Current medicines are reviewed at length with the patient today.  The patient concerns regarding his medicines were addressed.  The following  changes have been made:  No change  Labs/ tests ordered today include:  No orders of the defined types were placed in this encounter.   Recommend 150 minutes/week of aerobic exercise Low fat, low carb, high fiber diet recommended  Disposition:   FU in 6 months   Signed, Lance Muss, MD  06/13/2023 3:44 PM    [facility] [address] [place], [place], [place]  [address] Phone: [phone]; Fax: [phone]

## 2023-06-13 NOTE — Patient Instructions (Signed)
Medication Instructions:  Your physician recommends that you continue on your current medications as directed. Please refer to the Current Medication list given to you today.  *If you need a refill on your cardiac medications before your next appointment, please call your pharmacy*   Lab Work: none If you have labs (blood work) drawn today and your tests are completely normal, you will receive your results only by: MyChart Message (if you have MyChart) OR A paper copy in the mail If you have any lab test that is abnormal or we need to change your treatment, we will call you to review the results.   Testing/Procedures: none   Follow-Up: At [facility], you and your health needs are our priority.  As part of our continuing mission to provide you with exceptional heart care, we have created designated Provider Care Teams.  These Care Teams include your primary Cardiologist (physician) and Advanced Practice Providers (APPs -  Physician Assistants and Nurse Practitioners) who all work together to provide you with the care you need, when you need it.  We recommend signing up for the patient portal called "MyChart".  Sign up information is provided on this After Visit Summary.  MyChart is used to connect with patients for Virtual Visits (Telemedicine).  Patients are able to view lab/test results, encounter notes, upcoming appointments, etc.  Non-urgent messages can be sent to your provider as well.   To learn more about what you can do with MyChart, go to [email or web address].    Your next appointment:   June 2025  Provider:   Lance Muss, MD     Other Instructions Please schedule EP follow up for December 2024  I would be happy to help recommend someone for you to see in the future who will take excellent care of you.  If you prefer to stay at our [facility] street office, I am referring my patient's to Drs. Ruben Im, [place] and [place] who are all  excellent.  If you prefer to go to our [facility] office near [facility], I recommend seeing Drs. Cindie Crumbly and [place] who are all wonderful.  If you prefer to go to our [place] office, both Dr. Cristal Deer and Dr. Duke Salvia are great.  Once you choose your provider, feel free to call their office to schedule!  Wishing you all the best!  Sincerely,  Everette Rank, MD

## 2023-07-10 ENCOUNTER — Other Ambulatory Visit: Payer: Self-pay | Admitting: Family Medicine

## 2023-07-10 DIAGNOSIS — M85851 Other specified disorders of bone density and structure, right thigh: Secondary | ICD-10-CM

## 2023-07-18 ENCOUNTER — Ambulatory Visit: Payer: Medicare Other

## 2023-07-18 DIAGNOSIS — I480 Paroxysmal atrial fibrillation: Secondary | ICD-10-CM

## 2023-07-18 DIAGNOSIS — I5022 Chronic systolic (congestive) heart failure: Secondary | ICD-10-CM

## 2023-07-21 LAB — CUP PACEART REMOTE DEVICE CHECK
Battery Impedance: 3180 Ohm
Battery Remaining Longevity: 21 mo
Battery Voltage: 2.72 V
Brady Statistic AP VP Percent: 0 %
Brady Statistic AP VS Percent: 83 %
Brady Statistic AS VP Percent: 0 %
Brady Statistic AS VS Percent: 17 %
Date Time Interrogation Session: 20240918131109
Implantable Lead Connection Status: 753985
Implantable Lead Connection Status: 753985
Implantable Lead Implant Date: 20111206
Implantable Lead Implant Date: 20111206
Implantable Lead Location: 753859
Implantable Lead Location: 753860
Implantable Lead Model: 5076
Implantable Lead Model: 5092
Implantable Pulse Generator Implant Date: 20111206
Lead Channel Impedance Value: 512 Ohm
Lead Channel Impedance Value: 830 Ohm
Lead Channel Pacing Threshold Amplitude: 0.875 V
Lead Channel Pacing Threshold Amplitude: 1.125 V
Lead Channel Pacing Threshold Pulse Width: 0.4 ms
Lead Channel Pacing Threshold Pulse Width: 0.4 ms
Lead Channel Setting Pacing Amplitude: 2.25 V
Lead Channel Setting Pacing Amplitude: 2.5 V
Lead Channel Setting Pacing Pulse Width: 0.4 ms
Lead Channel Setting Sensing Sensitivity: 5.6 mV
Zone Setting Status: 755011
Zone Setting Status: 755011

## 2023-08-02 ENCOUNTER — Other Ambulatory Visit: Payer: Self-pay | Admitting: Interventional Cardiology

## 2023-08-02 ENCOUNTER — Telehealth: Payer: Self-pay | Admitting: Interventional Cardiology

## 2023-08-02 DIAGNOSIS — I4821 Permanent atrial fibrillation: Secondary | ICD-10-CM

## 2023-08-02 MED ORDER — APIXABAN 2.5 MG PO TABS
2.5000 mg | ORAL_TABLET | Freq: Two times a day (BID) | ORAL | 1 refills | Status: DC
Start: 2023-08-02 — End: 2023-12-12

## 2023-08-02 NOTE — Progress Notes (Signed)
Remote pacemaker transmission.   

## 2023-08-02 NOTE — Telephone Encounter (Signed)
Prescription refill request for Eliquis received. Indication: Afib  Last office visit: 06/13/23 Gregory Mccormick)  Scr: 1.67 (04/14/23)  Age: 89 Weight: 82.4kg  Appropriate dose. Refill sent.

## 2023-08-02 NOTE — Telephone Encounter (Signed)
*  STAT* If patient is at the pharmacy, call can be transferred to refill team.   1. Which medications need to be refilled? (please list name of each medication and dose if known)   ELIQUIS 2.5 MG TABS tablet    2. Which pharmacy/location (including street and city if local pharmacy) is medication to be sent to? [facility] - [place], [place] - [address]   3. Do they need a 30 day or 90 day supply? 90

## 2023-09-26 ENCOUNTER — Telehealth: Payer: Self-pay | Admitting: Cardiology

## 2023-09-26 ENCOUNTER — Telehealth: Payer: Self-pay

## 2023-09-26 NOTE — Telephone Encounter (Signed)
  1. Has your device fired?  Need to know if patient is able to have a MRI with his device  2. Is you device beeping?   3. Are you experiencing draining or swelling at device site?   4. Are you calling to see if we received your device transmission?   5. Have you passed out? no   Please route to Device Clinic Pool

## 2023-09-26 NOTE — Telephone Encounter (Signed)
Pt called in wanting to know if his device is MRI compatible. Pt would like a call back

## 2023-09-27 NOTE — Telephone Encounter (Signed)
Patient called and advised MRI. Patient appreciative of call back.

## 2023-09-27 NOTE — Telephone Encounter (Signed)
Attempted to contact patient to advise about MRI. No answer, LMTCB.

## 2023-09-27 NOTE — Telephone Encounter (Signed)
PT has an adapta which is not compatible at [facility] with MRI. [facility] will possibly scan device. Will call patient during business hours to let inform pt.

## 2023-10-01 ENCOUNTER — Other Ambulatory Visit: Payer: Self-pay | Admitting: Internal Medicine

## 2023-10-01 ENCOUNTER — Other Ambulatory Visit: Payer: Self-pay | Admitting: Interventional Cardiology

## 2023-10-01 NOTE — Progress Notes (Unsigned)
Cardiology Office Note:  .   Date:  10/01/2023  ID:  Gregory Mccormick, DOB Jun 04, 1929, MRN [id number] PCP: Tally Joe, MD  [facility] Providers Cardiologist:  Lance Muss, MD Electrophysiologist:  Lanier Prude, MD {  History of Present Illness: .   Gregory Mccormick is a 89 y.o. male w/PMHx of CAD (last intervention 2014), OSA he reports intolerant of CPAP, obesity, HTN, HLD, hx of Mobtitz II heart block s/p PPM, ICM, VHD (s/p TAVR 04/12/2022)  He saw dr. Lalla Brothers 04/07/23, doing well, no changes were made.  Saw Dr. Eldridge Dace 06/13/23, walking the dog, mowing the lawn, denied symptoms, some edema as the day progresses. Noted severe MR< volume stable with SOB, no changes were made   Today's visit is scheduled as an " 6 month"  visit  ROS:   *** eliquis, labs, dose, bleeding *** amiodarone labs *** AF burden ***  ? NSVT *** volume >>>    Device information MDT dual chamber PPM implanted 10/05/2010  Arrhythmia/AAD hx Amiodarone started   Studies Reviewed: Marland Kitchen    EKG not done today  DEVICE interrogation done today and reviewed by myself *** Battery and lead measurements are good ***    03/31/23: TTE 1. Left ventricular ejection fraction, by estimation, is 55 to 60%. The  left ventricle has normal function. The left ventricle has no regional  wall motion abnormalities. Left ventricular diastolic parameters were  normal.   2. Right ventricular systolic function is normal. The right ventricular  size is normal.   3. There is likely severe very eccentric mitral regurgitation with Coanda  effect.   4. Tricuspid valve regurgitation is mild to moderate.   5. The aortic valve has been repaired/replaced. Aortic valve  regurgitation is not visualized. No aortic stenosis is present. Echo  findings are consistent with a small perivalvular leak of the aortic  prosthesis.   6. The inferior vena cava is normal in size with greater than 50%  respiratory variability,  suggesting right atrial pressure of 3 mmHg.    Risk Assessment/Calculations:    Physical Exam:   VS:  There were no vitals taken for this visit.   Wt Readings from Last 3 Encounters:  06/13/23 181 lb 9.6 oz (82.4 kg)  04/14/23 181 lb (82.1 kg)  04/07/23 179 lb 12.8 oz (81.6 kg)    GEN: Well nourished, well developed in no acute distress NECK: No JVD; No carotid bruits CARDIAC: ***RRR, no murmurs, rubs, gallops RESPIRATORY:  *** CTA b/l without rales, wheezing or rhonchi  ABDOMEN: Soft, non-tender, non-distended EXTREMITIES: *** No edema; No deformity   PPM site: *** is stable, no thinning, fluctuation, tethering  ASSESSMENT AND PLAN: .    1.  PPM     *** intact function     *** no programming changes made   2. Paroxysmal AFib     CHADS2Vasc is ***, on ***, appropriately dosed     *** % vurden   3 CAD    *** No CP    ***  On BB, statin, *** no ASA/plavix w/Xarelto   4 HTN    *** Appears well controlled   5. VHD Hx of TAVR, known small PVL Severe MR     ***  6. Secondary hypercoagulable state 2/2 AFib     {Are you ordering a CV Procedure (e.g. stress test, cath, DCCV, TEE, etc)?   Press F2        :[id number]}     Dispo: ***  Signed, Sheilah Pigeon, PA-C

## 2023-10-04 ENCOUNTER — Ambulatory Visit: Payer: Medicare Other | Attending: Physician Assistant | Admitting: Physician Assistant

## 2023-10-04 ENCOUNTER — Other Ambulatory Visit: Payer: Self-pay | Admitting: Physician Assistant

## 2023-10-04 ENCOUNTER — Encounter: Payer: Self-pay | Admitting: Physician Assistant

## 2023-10-04 VITALS — BP 133/70 | HR 72 | Ht 69.0 in | Wt 183.0 lb

## 2023-10-04 DIAGNOSIS — I2510 Atherosclerotic heart disease of native coronary artery without angina pectoris: Secondary | ICD-10-CM | POA: Diagnosis not present

## 2023-10-04 DIAGNOSIS — I480 Paroxysmal atrial fibrillation: Secondary | ICD-10-CM

## 2023-10-04 DIAGNOSIS — I10 Essential (primary) hypertension: Secondary | ICD-10-CM

## 2023-10-04 DIAGNOSIS — I340 Nonrheumatic mitral (valve) insufficiency: Secondary | ICD-10-CM

## 2023-10-04 DIAGNOSIS — Z950 Presence of cardiac pacemaker: Secondary | ICD-10-CM

## 2023-10-04 DIAGNOSIS — Z79899 Other long term (current) drug therapy: Secondary | ICD-10-CM

## 2023-10-04 DIAGNOSIS — Z952 Presence of prosthetic heart valve: Secondary | ICD-10-CM

## 2023-10-04 DIAGNOSIS — D6869 Other thrombophilia: Secondary | ICD-10-CM

## 2023-10-04 LAB — CUP PACEART INCLINIC DEVICE CHECK
Battery Impedance: 3190 Ohm
Battery Remaining Longevity: 21 mo
Battery Voltage: 2.71 V
Brady Statistic AP VP Percent: 1 %
Brady Statistic AP VS Percent: 83 %
Brady Statistic AS VP Percent: 0 %
Brady Statistic AS VS Percent: 16 %
Date Time Interrogation Session: 20241204124319
Implantable Lead Connection Status: 753985
Implantable Lead Connection Status: 753985
Implantable Lead Implant Date: 20111206
Implantable Lead Implant Date: 20111206
Implantable Lead Location: 753859
Implantable Lead Location: 753860
Implantable Lead Model: 5076
Implantable Lead Model: 5092
Implantable Pulse Generator Implant Date: 20111206
Lead Channel Impedance Value: 514 Ohm
Lead Channel Impedance Value: 770 Ohm
Lead Channel Pacing Threshold Amplitude: 0.75 V
Lead Channel Pacing Threshold Amplitude: 1 V
Lead Channel Pacing Threshold Amplitude: 1 V
Lead Channel Pacing Threshold Amplitude: 1.125 V
Lead Channel Pacing Threshold Pulse Width: 0.4 ms
Lead Channel Pacing Threshold Pulse Width: 0.4 ms
Lead Channel Pacing Threshold Pulse Width: 0.4 ms
Lead Channel Pacing Threshold Pulse Width: 0.4 ms
Lead Channel Sensing Intrinsic Amplitude: 15.67 mV
Lead Channel Sensing Intrinsic Amplitude: 2 mV
Lead Channel Setting Pacing Amplitude: 2.25 V
Lead Channel Setting Pacing Amplitude: 2.5 V
Lead Channel Setting Pacing Pulse Width: 0.4 ms
Lead Channel Setting Sensing Sensitivity: 5.6 mV
Zone Setting Status: 755011
Zone Setting Status: 755011

## 2023-10-04 NOTE — Patient Instructions (Addendum)
Medication Instructions:   Your physician recommends that you continue on your current medications as directed. Please refer to the Current Medication list given to you today.  *If you need a refill on your cardiac medications before your next appointment, please call your pharmacy*   Lab Work:  CMET CBC AND TSH TODAY    If you have labs (blood work) drawn today and your tests are completely normal, you will receive your results only by: MyChart Message (if you have MyChart) OR A paper copy in the mail If you have any lab test that is abnormal or we need to change your treatment, we will call you to review the results.   Testing/Procedures: NONE ORDERED  TODAY    Follow-Up: At [facility], you and your health needs are our priority.  As part of our continuing mission to provide you with exceptional heart care, we have created designated Provider Care Teams.  These Care Teams include your primary Cardiologist (physician) and Advanced Practice Providers (APPs -  Physician Assistants and Nurse Practitioners) who all work together to provide you with the care you need, when you need it.  We recommend signing up for the patient portal called "MyChart".  Sign up information is provided on this After Visit Summary.  MyChart is used to connect with patients for Virtual Visits (Telemedicine).  Patients are able to view lab/test results, encounter notes, upcoming appointments, etc.  Non-urgent messages can be sent to your provider as well.   To learn more about what you can do with MyChart, go to [email or web address].    Your next appointment:   6 month(s)  Provider:   You may see  Advanced Practice Providers on your designated Care Team:   Francis Dowse, [place]    Other Instructions

## 2023-10-05 LAB — CBC WITH DIFFERENTIAL/PLATELET
Basophils Absolute: 0.1 10*3/uL (ref 0.0–0.2)
Basos: 1 %
EOS (ABSOLUTE): 0.2 10*3/uL (ref 0.0–0.4)
Eos: 3 %
Hematocrit: 43.1 % (ref 37.5–51.0)
Hemoglobin: 13.2 g/dL (ref 13.0–17.7)
Immature Grans (Abs): 0 10*3/uL (ref 0.0–0.1)
Immature Granulocytes: 1 %
Lymphocytes Absolute: 1 10*3/uL (ref 0.7–3.1)
Lymphs: 18 %
MCH: 30.6 pg (ref 26.6–33.0)
MCHC: 30.6 g/dL — ABNORMAL LOW (ref 31.5–35.7)
MCV: 100 fL — ABNORMAL HIGH (ref 79–97)
Monocytes Absolute: 0.4 10*3/uL (ref 0.1–0.9)
Monocytes: 8 %
Neutrophils Absolute: 3.8 10*3/uL (ref 1.4–7.0)
Neutrophils: 69 %
Platelets: 169 10*3/uL (ref 150–450)
RBC: 4.31 x10E6/uL (ref 4.14–5.80)
RDW: 13.1 % (ref 11.6–15.4)
WBC: 5.5 10*3/uL (ref 3.4–10.8)

## 2023-10-05 LAB — CMETAB+FERRITIN
AST: 20 [IU]/L (ref 0–40)
Albumin: 4.2 g/dL (ref 3.6–4.6)
Alkaline Phosphatase: 94 [IU]/L (ref 44–121)
BUN/Creatinine Ratio: 13 (ref 10–24)
BUN: 21 mg/dL (ref 10–36)
Bilirubin Total: 0.5 mg/dL (ref 0.0–1.2)
Calcium: 8.9 mg/dL (ref 8.6–10.2)
Chloride: 103 mmol/L (ref 96–106)
Creatinine, Ser: 1.68 mg/dL — ABNORMAL HIGH (ref 0.76–1.27)
Ferritin: 81 ng/mL (ref 30–400)
Globulin, Total: 2.1 g/dL (ref 1.5–4.5)
Glucose: 94 mg/dL (ref 70–99)
Potassium: 4.1 mmol/L (ref 3.5–5.2)
Sodium: 142 mmol/L (ref 134–144)
Total Protein: 6.3 g/dL (ref 6.0–8.5)
eGFR: 38 mL/min/{1.73_m2} — ABNORMAL LOW (ref >59)

## 2023-10-05 LAB — TSH: TSH: 3.84 u[IU]/mL (ref 0.450–4.500)

## 2023-10-11 ENCOUNTER — Telehealth: Payer: Self-pay | Admitting: Cardiology

## 2023-10-11 NOTE — Telephone Encounter (Signed)
Error

## 2023-10-12 ENCOUNTER — Telehealth: Payer: Self-pay

## 2023-10-12 NOTE — Telephone Encounter (Signed)
MRI clearance request received from Dr. Burgess Estelle with [facility].  Dr. Burgess Estelle was interested in ordering a brain MRI for Pt.  Outreach made to office.  Spoke with technician for Dr. Burgess Estelle.  Advised Pt has a Medtronic ADAPTA that is NOT MRI compatible.  No further action needed at this time.

## 2023-10-17 ENCOUNTER — Ambulatory Visit (INDEPENDENT_AMBULATORY_CARE_PROVIDER_SITE_OTHER): Payer: Medicare Other

## 2023-10-17 DIAGNOSIS — I480 Paroxysmal atrial fibrillation: Secondary | ICD-10-CM | POA: Diagnosis not present

## 2023-10-17 DIAGNOSIS — I5022 Chronic systolic (congestive) heart failure: Secondary | ICD-10-CM

## 2023-10-18 LAB — CUP PACEART REMOTE DEVICE CHECK
Battery Impedance: 3275 Ohm
Battery Remaining Longevity: 21 mo
Battery Voltage: 2.71 V
Brady Statistic AP VP Percent: 0 %
Brady Statistic AP VS Percent: 72 %
Brady Statistic AS VP Percent: 0 %
Brady Statistic AS VS Percent: 28 %
Date Time Interrogation Session: 20241217162607
Implantable Lead Connection Status: 753985
Implantable Lead Connection Status: 753985
Implantable Lead Implant Date: 20111206
Implantable Lead Implant Date: 20111206
Implantable Lead Location: 753859
Implantable Lead Location: 753860
Implantable Lead Model: 5076
Implantable Lead Model: 5092
Implantable Pulse Generator Implant Date: 20111206
Lead Channel Impedance Value: 518 Ohm
Lead Channel Impedance Value: 800 Ohm
Lead Channel Pacing Threshold Amplitude: 1 V
Lead Channel Pacing Threshold Amplitude: 1.125 V
Lead Channel Pacing Threshold Pulse Width: 0.4 ms
Lead Channel Pacing Threshold Pulse Width: 0.4 ms
Lead Channel Setting Pacing Amplitude: 2.25 V
Lead Channel Setting Pacing Amplitude: 2.5 V
Lead Channel Setting Pacing Pulse Width: 0.4 ms
Lead Channel Setting Sensing Sensitivity: 5.6 mV
Zone Setting Status: 755011
Zone Setting Status: 755011

## 2023-10-26 ENCOUNTER — Encounter: Payer: Self-pay | Admitting: Ophthalmology

## 2023-10-26 ENCOUNTER — Other Ambulatory Visit: Payer: Self-pay | Admitting: Ophthalmology

## 2023-10-26 DIAGNOSIS — H532 Diplopia: Secondary | ICD-10-CM

## 2023-10-26 DIAGNOSIS — H5022 Vertical strabismus, left eye: Secondary | ICD-10-CM

## 2023-11-15 ENCOUNTER — Ambulatory Visit
Admission: RE | Admit: 2023-11-15 | Discharge: 2023-11-15 | Disposition: A | Discharge status: Home / Self Care | Payer: Medicare Other | Source: Ambulatory Visit | Attending: Ophthalmology | Admitting: Ophthalmology

## 2023-11-15 DIAGNOSIS — H532 Diplopia: Secondary | ICD-10-CM

## 2023-11-15 DIAGNOSIS — H5022 Vertical strabismus, left eye: Secondary | ICD-10-CM

## 2023-11-15 MED ORDER — IOPAMIDOL (ISOVUE-300) INJECTION 61%
50.0000 mL | Freq: Once | INTRAVENOUS | Status: AC | PRN
  Administered 2023-11-15: 50 mL via INTRAVENOUS

## 2023-11-24 NOTE — Progress Notes (Signed)
Remote pacemaker transmission.

## 2023-11-24 NOTE — Addendum Note (Signed)
Addended by: Geralyn Flash D on: 11/24/2023 09:53 AM   Modules accepted: Orders

## 2023-12-02 DEATH — deceased

## 2023-12-24 ENCOUNTER — Other Ambulatory Visit: Payer: Self-pay | Admitting: Interventional Cardiology

## 2023-12-24 DIAGNOSIS — I4821 Permanent atrial fibrillation: Secondary | ICD-10-CM

## 2024-01-25 ENCOUNTER — Telehealth: Payer: Self-pay | Admitting: Cardiology

## 2024-01-25 NOTE — Telephone Encounter (Signed)
 Tried to return call to [facility]. Was on hold for 10 mis

## 2024-01-25 NOTE — Telephone Encounter (Signed)
 Calling to get what patient dx code were for his labs on 10/04/23. Please advise

## 2024-03-01 ENCOUNTER — Other Ambulatory Visit: Payer: Medicare Other
